# Patient Record
Sex: Male | Born: 1945 | Race: Black or African American | Hispanic: No | Smoking: Current every day smoker
Health system: Southern US, Community
[De-identification: ages and names within clinical notes are randomized; demographics above are authoritative.]

## PROBLEM LIST (undated history)

## (undated) DIAGNOSIS — M5136 Other intervertebral disc degeneration, lumbar region: Secondary | ICD-10-CM

## (undated) DIAGNOSIS — N400 Enlarged prostate without lower urinary tract symptoms: Secondary | ICD-10-CM

## (undated) DIAGNOSIS — K529 Noninfective gastroenteritis and colitis, unspecified: Secondary | ICD-10-CM

## (undated) DIAGNOSIS — M51369 Other intervertebral disc degeneration, lumbar region without mention of lumbar back pain or lower extremity pain: Secondary | ICD-10-CM

## (undated) DIAGNOSIS — R4182 Altered mental status, unspecified: Secondary | ICD-10-CM

## (undated) DIAGNOSIS — I10 Essential (primary) hypertension: Secondary | ICD-10-CM

## (undated) DIAGNOSIS — I639 Cerebral infarction, unspecified: Secondary | ICD-10-CM

## (undated) DIAGNOSIS — A419 Sepsis, unspecified organism: Secondary | ICD-10-CM

## (undated) DIAGNOSIS — I998 Other disorder of circulatory system: Secondary | ICD-10-CM

## (undated) DIAGNOSIS — N289 Disorder of kidney and ureter, unspecified: Secondary | ICD-10-CM

## (undated) DIAGNOSIS — Z7409 Other reduced mobility: Secondary | ICD-10-CM

## (undated) DIAGNOSIS — I82409 Acute embolism and thrombosis of unspecified deep veins of unspecified lower extremity: Secondary | ICD-10-CM

## (undated) DIAGNOSIS — G8929 Other chronic pain: Secondary | ICD-10-CM

## (undated) DIAGNOSIS — M21372 Foot drop, left foot: Secondary | ICD-10-CM

## (undated) DIAGNOSIS — K567 Ileus, unspecified: Secondary | ICD-10-CM

## (undated) DIAGNOSIS — R29898 Other symptoms and signs involving the musculoskeletal system: Secondary | ICD-10-CM

## (undated) HISTORY — PX: BACK SURGERY: SHX140

## (undated) HISTORY — PX: ELBOW SURGERY: SHX618

---

## 2001-04-09 ENCOUNTER — Emergency Department (HOSPITAL_COMMUNITY): Admission: EM | Admit: 2001-04-09 | Discharge: 2001-04-09 | Payer: Self-pay | Admitting: *Deleted

## 2004-06-01 ENCOUNTER — Ambulatory Visit (HOSPITAL_COMMUNITY): Admission: RE | Admit: 2004-06-01 | Discharge: 2004-06-01 | Payer: Self-pay | Admitting: Chiropractic Medicine

## 2007-03-14 ENCOUNTER — Emergency Department (HOSPITAL_COMMUNITY): Admission: EM | Admit: 2007-03-14 | Discharge: 2007-03-14 | Payer: Self-pay | Admitting: Emergency Medicine

## 2007-06-30 ENCOUNTER — Ambulatory Visit (HOSPITAL_COMMUNITY): Admission: RE | Admit: 2007-06-30 | Discharge: 2007-06-30 | Payer: Self-pay | Admitting: Orthopedic Surgery

## 2007-07-31 ENCOUNTER — Ambulatory Visit (HOSPITAL_COMMUNITY): Admission: RE | Admit: 2007-07-31 | Discharge: 2007-07-31 | Payer: Self-pay | Admitting: Orthopedic Surgery

## 2007-08-21 ENCOUNTER — Inpatient Hospital Stay (HOSPITAL_COMMUNITY): Admission: RE | Admit: 2007-08-21 | Discharge: 2007-08-25 | Payer: Self-pay | Admitting: Orthopedic Surgery

## 2009-02-01 ENCOUNTER — Emergency Department (HOSPITAL_COMMUNITY): Admission: EM | Admit: 2009-02-01 | Discharge: 2009-02-01 | Payer: Self-pay | Admitting: Emergency Medicine

## 2009-04-30 ENCOUNTER — Emergency Department (HOSPITAL_COMMUNITY): Admission: EM | Admit: 2009-04-30 | Discharge: 2009-04-30 | Payer: Self-pay | Admitting: Emergency Medicine

## 2009-07-22 ENCOUNTER — Emergency Department (HOSPITAL_COMMUNITY): Admission: EM | Admit: 2009-07-22 | Discharge: 2009-07-22 | Payer: Self-pay | Admitting: Emergency Medicine

## 2009-08-31 ENCOUNTER — Emergency Department (HOSPITAL_COMMUNITY): Admission: EM | Admit: 2009-08-31 | Discharge: 2009-08-31 | Payer: Self-pay | Admitting: Emergency Medicine

## 2010-05-10 LAB — DIFFERENTIAL
Basophils Absolute: 0 10*3/uL (ref 0.0–0.1)
Basophils Relative: 1 % (ref 0–1)
Lymphs Abs: 1.9 10*3/uL (ref 0.7–4.0)
Monocytes Relative: 10 % (ref 3–12)
Neutro Abs: 3.1 10*3/uL (ref 1.7–7.7)

## 2010-05-10 LAB — CBC
HCT: 36.8 % — ABNORMAL LOW (ref 39.0–52.0)
MCV: 91 fL (ref 78.0–100.0)
Platelets: 175 10*3/uL (ref 150–400)
RBC: 4.04 MIL/uL — ABNORMAL LOW (ref 4.22–5.81)
RDW: 13.9 % (ref 11.5–15.5)
WBC: 5.8 10*3/uL (ref 4.0–10.5)

## 2010-05-10 LAB — BASIC METABOLIC PANEL
BUN: 8 mg/dL (ref 6–23)
Chloride: 106 mEq/L (ref 96–112)
Creatinine, Ser: 0.88 mg/dL (ref 0.4–1.5)
GFR calc Af Amer: >60 mL/min (ref >60)

## 2010-05-17 LAB — URINALYSIS, ROUTINE W REFLEX MICROSCOPIC
Glucose, UA: NEGATIVE mg/dL
Hgb urine dipstick: NEGATIVE
Protein, ur: NEGATIVE mg/dL
pH: 7 (ref 5.0–8.0)

## 2010-07-06 NOTE — Op Note (Signed)
NAME:  Brian [name], Brian [name] NO.:  [id number]   MEDICAL RECORD NO.:  [id number]          PATIENT TYPE:  INP   LOCATION:  5031                         FACILITY:  [facility]   PHYSICIAN:  Nelda Severe, MD      DATE OF BIRTH:  10/18/1945   DATE OF PROCEDURE:  08/21/2007  DATE OF DISCHARGE:                               OPERATIVE REPORT   SURGEON:  Nelda Severe, MD   ASSISTANT:  Lianne Cure, PA-C   PREOPERATIVE DIAGNOSES:  Lumbar spinal stenosis status post previous  laminectomy; grade 1 degenerative spondylolisthesis L4-L5.   POSTOPERATIVE DIAGNOSES:  Lumbar spinal stenosis status post previous  laminectomy; grade 1 degenerative spondylolisthesis L4-L5.   OPERATIVE PROCEDURES:  1. Bilateral laminectomies.  2. Foraminotomies.  3. Lateral recess decompression at L3-L4, L4-L5, L5-S1.  4. Posterolateral fusion L4-L5 with locally harvested autograft.   OPERATIVE NOTE:  The patient was placed under general endotracheal  anesthesia.  He had received 1 g of vancomycin intravenously as  prophylaxis against infection.  A Foley catheter was placed in the  bladder.  Sequential compression devices were placed on both lower  extremities.  He was positioned prone on a Jackson frame.  Care was  taken to position the upper extremities so as to avoid hyperflexion and  abduction of the shoulders and hyperflexion of the elbows.  The hips and  knees were gently flexed and supported on pillows.   The previous midline incision was marked with a skin marker.  The skin  was prepped with DuraPrep and the lumbar area draped in rectangular  fashion.  The drapes were secured with Ioban.  The subcutaneous tissue  was injected with a mixture of 0.25% plain Marcaine and 1% lidocaine  with epinephrine.  An incision was made in elliptical fashion around the  previously placed midline incision so as to excise the scar.  Dissection  was carried down through a very thin subcutaneous layer to the  thoracolumbar fascia using cutting current.  The midline was identified.  Cutting current was used to cut down on the spinous process of L3.  We  then incised the midline down to the lamina from L3 through S1.  Paraspinal muscles were mobilized bilaterally and on the left side I  exposed the transverse processes of L4 and L5.  Position was checked  with a cross-table lateral radiograph placing a Penfield #4 dissector  between the lamina of L3 and L4 and this proved to be the correct  location.  There was also a Kocher on the spinous process of S1.   Having denuded the lamina from L3-L4 through L5-S1 of soft tissue, I  began the process of harvesting local bone graft and thinning out the  lamina using an acetabular reamer and in fact we harvested a moderately  large amount of morcellized graft.  We then began the actual laminectomy  at the L3-L4 level.  Ultimately laminectomy was performed from the  distal portion of the L3 lamina through the L5 lamina and performing  partial facetectomies at L3-L4, L4-L5, and L5-S1 bilaterally.  This was  done using high-speed  bur, various sizes of Kerrison rongeurs, and  curettes.  At all times the dura was protected by interposing cottonoid  patties in the plane between the dura and overlying scar and the bone,  which overlay that.  This is a case in which there had been massive bone  overgrowth over a large number of years since last laminectomy.  Eventually the lateral recess at L4, L5, and S1 was decompressed and  foraminotomies performed at L3-L4, L4-L5, and L5-S1.  There were no  complications during the laminectomies.  Facet joints were all  maintained.   I then exposed the right-sided L4-L5 transverse processes, decorticated  them with a high-speed bur, and packed half the harvested autograft in  the interval.  The transverse process on the left side were also  decorticated with a high-speed bur and the other half of the bone graft  packed  posterolaterally on the right side.   We did attempt to inject 0.75 mg of intrathecal, preservative-free  morphine, but I got a bloody tap, so no further injection was carried  out.   A 15-gauge Blake drain was placed in the subfascial layer and brought  out through the skin to the right side.  It was secured using a figure-  of-eight 2-0 Vicryl suture in basket-weave fashion.  The thoracolumbar  fascia was then closed using continuous interrupted #1 Vicryl suture.  An one-eighth-inch Hemovac drain was placed in the subcutaneous layer  and also secured with 2-0 nylon.  Subcutaneous layer was closed using  interrupted 2-0 undyed Vicryl in inverted fashion.  The skin was closed  using a subcuticular running 3-0 undyed Vicryl stitch.  The skin edges  were reinforced with Steri-Strips.  Nonadherent antibiotic ointment  dressing was applied and secured with OpSite.   ESTIMATED BLOOD LOSS:  250 mL.   At the time of dictation, I did not know if the patient has gotten any  cell saver blood back were not.  The cell saver was used throughout the  procedure.  There were no intraoperative complications.  The sponge and  needle counts were correct.      Nelda Severe, MD  Electronically Signed     MT/MEDQ  D:  08/21/2007  T:  08/22/2007  Job:  [id number]

## 2010-07-09 NOTE — Discharge Summary (Signed)
NAME:  Brian [name], Brian [name] NO.:  [id number]   MEDICAL RECORD NO.:  [id number]          PATIENT TYPE:  INP   LOCATION:  5031                         FACILITY:  [facility]   PHYSICIAN:  Nelda Severe, MD      DATE OF BIRTH:  [birth year]   DATE OF ADMISSION:  08/21/2007  DATE OF DISCHARGE:  08/25/2007                               DISCHARGE SUMMARY   ADMITTING DIAGNOSIS:  Lumbar stenosis.   BRIEF HISTORY OF STAY:  He was admitted to [facility] on August 21, 2007 for surgical fusion of L3-S1 without instrumentation.  Blood  loss was estimated at 500 mL.  Postoperatively, the patient was stable  and admitted to 5000 at [facility].  Hemoglobin was stable at 9.9.  He  was asymptomatic, no dizziness, no short of breath, no chest pain.  Dressing was clean, dry, and intact.  Drains were in place, 370 mL total  output of drains.  Postop day #2, the patient states his legs felt  significantly better postoperatively than prior to surgery.  We  discontinued the drains.  Clean dry dressing was applied.  Discontinued  the PCA and hep-locked the IVs.  Physical Therapy was consulted for  ambulation and mobility.  Case manager was consulted for home health  needs.  On postop day #3, the patient was stable, afebrile, and vital  signs were stable.  We discontinued the dressing.  There was no drainage  whatsoever.  No openings in the skin.  No erythema, no edema.  He will  be discharged home.   DIAGNOSIS:  Lumbar stenosis L3-S1 status post laminectomy fusion without  instrumentation.   DISPOSITION:  Stable.   DIET:  Regular.   PLAN:  He is going to walk for exercise.  He may shower.  He is going to  follow up with Dr. Nelda Severe in 4 weeks.  We will send him home with  Norco 10/325 and Robaxin 500 mg take as directed.      Lianne Cure, P.A.       Nelda Severe, MD  Electronically Signed    MC/MEDQ  D:  09/18/2007  T:  09/19/2007  Job:  [id number]

## 2010-11-18 LAB — DIFFERENTIAL
Eosinophils Absolute: 0.2
Eosinophils Relative: 3
Lymphs Abs: 2.7
Monocytes Absolute: 0.8
Monocytes Relative: 12
Neutro Abs: 3.1
Neutrophils Relative %: 45

## 2010-11-18 LAB — COMPREHENSIVE METABOLIC PANEL
ALT: 14
Albumin: 3.5
CO2: 26
Calcium: 9.2
Creatinine, Ser: 0.95
Glucose, Bld: 95
Potassium: 4.3
Total Protein: 7.2

## 2010-11-18 LAB — CBC
HCT: 37.9 — ABNORMAL LOW
HCT: 28.8 — ABNORMAL LOW
Hemoglobin: 12.9 — ABNORMAL LOW
MCHC: 34.1
MCHC: 33.3
MCHC: 34.5
MCV: 89.9
MCV: 91.3
Platelets: 116 — ABNORMAL LOW
Platelets: 134 — ABNORMAL LOW
RBC: 3.05 — ABNORMAL LOW
RDW: 13.3
WBC: 6.9
WBC: 8.1
WBC: 9.7

## 2010-11-18 LAB — BASIC METABOLIC PANEL
BUN: 4 — ABNORMAL LOW
BUN: 6
CO2: 26
CO2: 27
Calcium: 8.2 — ABNORMAL LOW
Chloride: 104
Chloride: 104
Creatinine, Ser: 0.83
Creatinine, Ser: 0.92
GFR calc Af Amer: >60
Potassium: 3.8

## 2010-11-18 LAB — VITAMIN D 25 HYDROXY (VIT D DEFICIENCY, FRACTURES): Vit D, 25-Hydroxy: 31 (ref 30–89)

## 2010-11-18 LAB — URINE CULTURE: Colony Count: 3000

## 2010-11-18 LAB — URINALYSIS, ROUTINE W REFLEX MICROSCOPIC
Bilirubin Urine: NEGATIVE
Glucose, UA: NEGATIVE
Ketones, ur: 15 — AB
Protein, ur: NEGATIVE
Specific Gravity, Urine: 1.036 — ABNORMAL HIGH
pH: 6.5

## 2010-11-18 LAB — TYPE AND SCREEN: ABO/RH(D): O POS

## 2010-11-18 LAB — ABO/RH: ABO/RH(D): O POS

## 2010-12-13 ENCOUNTER — Inpatient Hospital Stay (HOSPITAL_COMMUNITY)
Admission: EM | Admit: 2010-12-13 | Discharge: 2010-12-24 | DRG: 329 | Disposition: A | Discharge status: Other Acute Inpatient Hospital | Payer: Medicare Other | Attending: Internal Medicine | Admitting: Internal Medicine

## 2010-12-13 ENCOUNTER — Emergency Department (HOSPITAL_COMMUNITY): Payer: Medicare Other

## 2010-12-13 ENCOUNTER — Encounter: Payer: Self-pay | Admitting: *Deleted

## 2010-12-13 DIAGNOSIS — D72829 Elevated white blood cell count, unspecified: Secondary | ICD-10-CM | POA: Diagnosis present

## 2010-12-13 DIAGNOSIS — K55059 Acute (reversible) ischemia of intestine, part and extent unspecified: Principal | ICD-10-CM | POA: Diagnosis present

## 2010-12-13 DIAGNOSIS — J189 Pneumonia, unspecified organism: Secondary | ICD-10-CM | POA: Diagnosis not present

## 2010-12-13 DIAGNOSIS — R112 Nausea with vomiting, unspecified: Secondary | ICD-10-CM | POA: Diagnosis present

## 2010-12-13 DIAGNOSIS — I10 Essential (primary) hypertension: Secondary | ICD-10-CM | POA: Diagnosis present

## 2010-12-13 DIAGNOSIS — K529 Noninfective gastroenteritis and colitis, unspecified: Secondary | ICD-10-CM

## 2010-12-13 DIAGNOSIS — D649 Anemia, unspecified: Secondary | ICD-10-CM | POA: Diagnosis not present

## 2010-12-13 DIAGNOSIS — I776 Arteritis, unspecified: Secondary | ICD-10-CM | POA: Diagnosis present

## 2010-12-13 DIAGNOSIS — K5289 Other specified noninfective gastroenteritis and colitis: Secondary | ICD-10-CM | POA: Diagnosis present

## 2010-12-13 DIAGNOSIS — N179 Acute kidney failure, unspecified: Secondary | ICD-10-CM | POA: Diagnosis not present

## 2010-12-13 DIAGNOSIS — K219 Gastro-esophageal reflux disease without esophagitis: Secondary | ICD-10-CM | POA: Diagnosis present

## 2010-12-13 DIAGNOSIS — K560 Paralytic ileus: Secondary | ICD-10-CM | POA: Diagnosis not present

## 2010-12-13 DIAGNOSIS — R1013 Epigastric pain: Secondary | ICD-10-CM | POA: Diagnosis present

## 2010-12-13 HISTORY — DX: Noninfective gastroenteritis and colitis, unspecified: K52.9

## 2010-12-13 HISTORY — DX: Cerebral infarction, unspecified: I63.9

## 2010-12-13 HISTORY — DX: Essential (primary) hypertension: I10

## 2010-12-13 LAB — URINALYSIS, ROUTINE W REFLEX MICROSCOPIC
Glucose, UA: NEGATIVE mg/dL
Leukocytes, UA: NEGATIVE
pH: 6 (ref 5.0–8.0)

## 2010-12-13 LAB — DIFFERENTIAL
Basophils Absolute: 0 10*3/uL (ref 0.0–0.1)
Basophils Relative: 0 % (ref 0–1)
Lymphocytes Relative: 18 % (ref 12–46)
Neutro Abs: 8.7 10*3/uL — ABNORMAL HIGH (ref 1.7–7.7)
Neutrophils Relative %: 71 % (ref 43–77)

## 2010-12-13 LAB — COMPREHENSIVE METABOLIC PANEL
ALT: 6 U/L (ref 0–53)
AST: 12 U/L (ref 0–37)
Albumin: 3.1 g/dL — ABNORMAL LOW (ref 3.5–5.2)
Alkaline Phosphatase: 78 U/L (ref 39–117)
CO2: 27 mEq/L (ref 19–32)
Chloride: 99 mEq/L (ref 96–112)
Creatinine, Ser: 0.9 mg/dL (ref 0.50–1.35)
Potassium: 4 mEq/L (ref 3.5–5.1)
Sodium: 134 mEq/L — ABNORMAL LOW (ref 135–145)
Total Bilirubin: 0.7 mg/dL (ref 0.3–1.2)

## 2010-12-13 LAB — CBC
MCHC: 32.9 g/dL (ref 30.0–36.0)
RDW: 13.2 % (ref 11.5–15.5)
WBC: 12.3 10*3/uL — ABNORMAL HIGH (ref 4.0–10.5)

## 2010-12-13 LAB — URINE MICROSCOPIC-ADD ON

## 2010-12-13 MED ORDER — ALUM & MAG HYDROXIDE-SIMETH 200-200-20 MG/5ML PO SUSP: 30.0000 mL | Freq: Four times a day (QID) | ORAL | Status: DC | PRN

## 2010-12-13 MED ORDER — HYDROMORPHONE HCL 1 MG/ML IJ SOLN
1.0000 mg | INTRAMUSCULAR | Status: DC | PRN
Start: 2010-12-13 — End: 2010-12-19
  Administered 2010-12-13 – 2010-12-19 (×25): 1 mg via INTRAVENOUS
  Filled 2010-12-13 (×24): qty 1

## 2010-12-13 MED ORDER — OXYCODONE HCL 5 MG PO TABS
5.0000 mg | ORAL_TABLET | ORAL | Status: DC | PRN
  Administered 2010-12-14 – 2010-12-18 (×2): 5 mg via ORAL
  Filled 2010-12-13 (×2): qty 1

## 2010-12-13 MED ORDER — SODIUM CHLORIDE 0.9 % IV SOLN
INTRAVENOUS | Status: DC
  Administered 2010-12-13 – 2010-12-15 (×4): via INTRAVENOUS
  Administered 2010-12-16: 100 mL via INTRAVENOUS
  Administered 2010-12-16: 1000 mL via INTRAVENOUS

## 2010-12-13 MED ORDER — ACETAMINOPHEN 325 MG PO TABS
650.0000 mg | ORAL_TABLET | Freq: Four times a day (QID) | ORAL | Status: DC | PRN
  Administered 2010-12-15 – 2010-12-16 (×2): 650 mg via ORAL
  Filled 2010-12-13 (×2): qty 2

## 2010-12-13 MED ORDER — ONDANSETRON HCL 4 MG/2ML IJ SOLN: 4.0000 mg | Freq: Three times a day (TID) | INTRAMUSCULAR | Status: DC | PRN

## 2010-12-13 MED ORDER — SODIUM CHLORIDE 0.9 % IV SOLN
999.0000 mL | INTRAVENOUS | Status: DC
  Administered 2010-12-13: 1000 mL via INTRAVENOUS

## 2010-12-13 MED ORDER — HYDROMORPHONE HCL 1 MG/ML IJ SOLN
1.0000 mg | Freq: Once | INTRAMUSCULAR | Status: AC
  Administered 2010-12-13: 1 mg via INTRAVENOUS

## 2010-12-13 MED ORDER — IOHEXOL 300 MG/ML  SOLN
100.0000 mL | Freq: Once | INTRAMUSCULAR | Status: AC | PRN
  Administered 2010-12-13: 100 mL via INTRAVENOUS

## 2010-12-13 MED ORDER — ONDANSETRON HCL 4 MG/2ML IJ SOLN: 4.0000 mg | Freq: Four times a day (QID) | INTRAMUSCULAR | Status: DC | PRN

## 2010-12-13 MED ORDER — ONDANSETRON HCL 4 MG PO TABS: 4.0000 mg | ORAL_TABLET | Freq: Four times a day (QID) | ORAL | Status: DC | PRN

## 2010-12-13 MED ORDER — ENOXAPARIN SODIUM 40 MG/0.4ML ~~LOC~~ SOLN
40.0000 mg | SUBCUTANEOUS | Status: DC
  Administered 2010-12-13 – 2010-12-16 (×4): 40 mg via SUBCUTANEOUS
  Filled 2010-12-13 (×4): qty 0.4

## 2010-12-13 MED ORDER — MORPHINE SULFATE 4 MG/ML IJ SOLN
4.0000 mg | Freq: Once | INTRAMUSCULAR | Status: AC
  Administered 2010-12-13: 4 mg via INTRAVENOUS
  Filled 2010-12-13: qty 1

## 2010-12-13 MED ORDER — ACETAMINOPHEN 650 MG RE SUPP: 650.0000 mg | Freq: Four times a day (QID) | RECTAL | Status: DC | PRN

## 2010-12-13 MED ORDER — HYDROMORPHONE HCL 1 MG/ML IJ SOLN
0.5000 mg | INTRAMUSCULAR | Status: DC | PRN
  Filled 2010-12-13: qty 1

## 2010-12-13 MED ORDER — HYDROMORPHONE HCL 1 MG/ML IJ SOLN
INTRAMUSCULAR | Status: AC
  Filled 2010-12-13: qty 1

## 2010-12-13 MED ORDER — ONDANSETRON HCL 4 MG/2ML IJ SOLN
4.0000 mg | Freq: Once | INTRAMUSCULAR | Status: AC
  Administered 2010-12-13: 4 mg via INTRAVENOUS
  Filled 2010-12-13: qty 2

## 2010-12-13 NOTE — ED Notes (Signed)
Pt with mid abdomen pain since last night after eating a sirloin, denies N/V/D

## 2010-12-13 NOTE — ED Notes (Signed)
Pt states  Upper abdominal pain after eating last night "feels like food did not go down" Pt states he ate tenderloin.  Denies N/V/D/ Pt also states lower back pain.

## 2010-12-13 NOTE — ED Provider Notes (Signed)
Medical screening examination/treatment/procedure(s) were conducted as a shared visit with non-physician practitioner(s) and myself.  I personally evaluated the patient during the encounter Pt with abd pain.  Tender llq  Benny Lennert, MD 12/13/10 [phone]

## 2010-12-13 NOTE — ED Notes (Signed)
MD at [employer], PA in room

## 2010-12-13 NOTE — H&P (Signed)
PCP:   Josue Hector, MD   Chief Complaint:  Abdominal pain with nausea vomiting  HPI: Patient is a 65 year old African American male with no past medical history who has had approximately one to 2 days of nausea vomiting and mid epigastric to right upper quadrant abdominal pain. It is nonradiating. He has had no fevers. He has had no constipation or diarrhea. He usually not sick and so decided to come to the emergency room for further evaluation.  The emergency room he was noted to have white cell count of 12.  A CT scan done noted mesenteric edema, mild ascites and small bowel wall thickening. This is most likely felt to be in enteritis although technically a microperforation cannot be ruled out.  Review of Systems:  The patient denies anorexia, fever, weight loss,, vision loss, decreased hearing, hoarseness, chest pain, syncope, dyspnea on exertion, peripheral edema, balance deficits, hemoptysis,melena, hematochezia, severe indigestion/heartburn, hematuria, incontinence, genital sores, muscle weakness, suspicious skin lesions, transient blindness, difficulty walking, depression, unusual weight change, abnormal bleeding, enlarged lymph nodes, angioedema, and breast masses.  Past Medical History: History reviewed. No pertinent past medical history. Past Surgical History  Procedure Date  . Back surgery     Medications: Prior to Admission medications   Medication Sig Start Date End Date Taking? Authorizing Provider  aspirin EC 81 MG tablet Take 81 mg by mouth daily.     Yes Historical Provider, MD    Allergies:  No Known Allergies  Social History:  reports that he has been smoking.  He does not have any smokeless tobacco history on file. He reports that he drinks alcohol. He reports that he does not use illicit drugs.  Family History: Says no medical problems run in the family  Physical Exam: Filed Vitals:   12/13/10 1024 12/13/10 1428 12/13/10 1900  BP: 139/94 146/104  121/85  Pulse: 118 92 104  Temp: 97.8 F (36.6 C) 99.2 F (37.3 C)   TempSrc: Oral Oral   Resp: 16 16 20   Height: 5' 7.5" (1.715 m)    Weight: 69.4 kg (153 lb)    SpO2: 100% 95% 96%   General appearance: alert, cooperative and mild distress Head: Normocephalic, without obvious abnormality, atraumatic Resp: clear to auscultation bilaterally Cardio: regular rate and rhythm, S1, S2 normal, no murmur, click, rub or gallop GI: Soft, some mild tenderness in the midepigastric and right upper quadrant areas, nondistended, normoactive bowel sounds. Extremities: extremities normal, atraumatic, no cyanosis or edema   Labs on Admission:   [facility] 12/13/10 1226  NA 134*  K 4.0  CL 99  CO2 27  GLUCOSE 112*  BUN 15  CREATININE 0.90  CALCIUM 8.8  MG --  PHOS --    Basename 12/13/10 1226  AST 12  ALT 6  ALKPHOS 78  BILITOT 0.7  PROT 7.9  ALBUMIN 3.1*    Basename 12/13/10 1226  LIPASE 18  AMYLASE --    Basename 12/13/10 1226  WBC 12.3*  NEUTROABS 8.7*  HGB 14.5  HCT 44.1  MCV 90.9  PLT 232    Radiological Exams on Admission: Ct Abdomen Pelvis W Contrast  12/13/2010 CT ABDOMEN AND PELVIS WITH CONTRAST   IMPRESSION:  1.  Mid small bowel wall thickening in the periumbilical region, mesenteric edema and ascites of undetermined etiology.  The findings are suspicious for focal enteritis.  Microperforation cannot be excluded, although there is no extraluminal air. 2.  No evidence of large vessel occlusion or focal extraluminal fluid collection. 3.  Mild periportal edema within the liver.  No other significant solid parenchymal organ findings.  These results were called by telephone on 12/13/2010  at  1640 hours to  Dr. Adriana Simas, who verbally acknowledged these results.  Original Report Authenticated By: Gerrianne Scale, M.D.    Assessment/Plan Present on Admission:  .Enteritis: Likely mild and viral. No evidence of any early sepsis or bacterial infection. Plan treat  symptomatically with pain and nausea control and IV fluids. Recheck white blood cell count in the morning. Start on clear liquid diet and advance slowly as tolerated. Likely could go home tomorrow if feeling better.  .Nausea and vomiting: See above .Abdominal pain, acute, epigastric: See above  .Leukocytosis: Likely some stressed margination  Cala Kruckenberg K 12/13/2010, 7:02 PM

## 2010-12-13 NOTE — ED Provider Notes (Signed)
History     CSN: [id number] Arrival date & time: 12/13/2010 10:27 AM   First MD Initiated Contact with Patient 12/13/10 1201      Chief Complaint  Patient presents with  . Abdominal Pain    (Consider location/radiation/quality/duration/timing/severity/associated sxs/prior treatment) Patient is a 65 y.o. male presenting with abdominal pain. The history is provided by the patient.  Abdominal Pain The primary symptoms of the illness include abdominal pain. The primary symptoms of the illness do not include fever, shortness of breath, nausea, vomiting or diarrhea. The current episode started yesterday. The onset of the illness was gradual. The problem has been gradually worsening.  The abdominal pain is located in the periumbilical region. The abdominal pain radiates to the LLQ and LUQ. The severity of the abdominal pain is 10/10. The abdominal pain is relieved by nothing. The abdominal pain is exacerbated by movement.  Associated with: He denies nausea and vomiting,  no fevers. The patient has not had a change in bowel habit. Additional symptoms associated with the illness include back pain. Symptoms associated with the illness do not include diaphoresis, constipation, urgency or frequency. Associated symptoms comments: He has chronic back pain which is not worsened since his abdominal pain started.. Significant associated medical issues do not include inflammatory bowel disease, diabetes, gallstones, liver disease or substance abuse.    History reviewed. No pertinent past medical history.  Past Surgical History  Procedure Date  . Back surgery     No family history on file.  History  Substance Use Topics  . Smoking status: Current Some Day Smoker  . Smokeless tobacco: Not on file  . Alcohol Use: Yes     Occ      Review of Systems  Constitutional: Negative for fever and diaphoresis.  HENT: Negative for congestion, sore throat and neck pain.   Eyes: Negative.   Respiratory:  Negative for chest tightness and shortness of breath.   Cardiovascular: Negative for chest pain.  Gastrointestinal: Positive for abdominal pain. Negative for nausea, vomiting, diarrhea, constipation and abdominal distention.  Genitourinary: Negative.  Negative for urgency and frequency.  Musculoskeletal: Positive for back pain. Negative for joint swelling and arthralgias.  Skin: Negative.  Negative for rash and wound.  Neurological: Negative for dizziness, weakness, light-headedness, numbness and headaches.  Hematological: Negative.   Psychiatric/Behavioral: Negative.     Allergies  Review of patient's allergies indicates no known allergies.  Home Medications   Current Outpatient Rx  Name Route Sig Dispense Refill  . ASPIRIN EC 81 MG PO TBEC Oral Take 81 mg by mouth daily.        BP 146/104  Pulse 92  Temp(Src) 99.2 F (37.3 C) (Oral)  Resp 16  Ht 5' 7.5" (1.715 m)  Wt 153 lb (69.4 kg)  BMI 23.61 kg/m2  SpO2 95%  Physical Exam  Nursing note and vitals reviewed. Constitutional: He is oriented to person, place, and time. He appears well-developed and well-nourished. He appears distressed.  HENT:  Head: Normocephalic and atraumatic.  Neck: Normal range of motion.  Cardiovascular: Normal rate, regular rhythm, normal heart sounds and intact distal pulses.   Pulmonary/Chest: Effort normal and breath sounds normal. He has no wheezes.  Abdominal: Bowel sounds are normal. He exhibits no mass. There is no hepatosplenomegaly. There is tenderness in the periumbilical area, left upper quadrant and left lower quadrant. There is no rebound, no CVA tenderness, no tenderness at McBurney's point and negative Murphy's sign.  Musculoskeletal: Normal range of motion.  Neurological:  He is alert and oriented to person, place, and time.  Skin: Skin is warm and dry.  Psychiatric: He has a normal mood and affect.    ED Course  Procedures (including critical care time)  Labs Reviewed  CBC -  Abnormal; Notable for the following:    WBC 12.3 (*)    All other components within normal limits  DIFFERENTIAL - Abnormal; Notable for the following:    Neutro Abs 8.7 (*)    Monocytes Absolute 1.3 (*)    All other components within normal limits  COMPREHENSIVE METABOLIC PANEL - Abnormal; Notable for the following:    Sodium 134 (*)    Glucose, Bld 112 (*)    Albumin 3.1 (*)    GFR calc non Af Amer 89 (*)    All other components within normal limits  URINALYSIS, ROUTINE W REFLEX MICROSCOPIC - Abnormal; Notable for the following:    Specific Gravity, Urine >1.030 (*)    Hgb urine dipstick MODERATE (*)    Ketones, ur TRACE (*)    Protein, ur 30 (*)    All other components within normal limits  URINE MICROSCOPIC-ADD ON - Abnormal; Notable for the following:    Squamous Epithelial / LPF FEW (*)    Bacteria, UA FEW (*)    All other components within normal limits  LIPASE, BLOOD   Ct Abdomen Pelvis W Contrast  12/13/2010  *RADIOLOGY REPORT*  Clinical Data: Upper to mid abdominal pain since yesterday.  CT ABDOMEN AND PELVIS WITH CONTRAST  Technique:  Multidetector CT imaging of the abdomen and pelvis was performed following the standard protocol during bolus administration of intravenous contrast.  Contrast: OMNIPAQUE IOHEXOL 300 MG/ML IV SOLN  Comparison: Lumbar spine CT 07/31/2007.  Findings: There is mild scarring in both lung bases.  No significant pleural effusion is present.  There is a small to moderate amount of ascites throughout the peritoneal cavity.  The enteric contrast is just beginning to fill the proximal jejunum.  There is no extravasated enteric contrast or extraluminal air collection.  However, there are multiple loops of thickened small bowel in the periumbilical region.  There is surrounding mesenteric edema. The appendix appears normal.  The celiac trunk, superior and inferior mesenteric arteries appear widely patent.  There are scattered vascular calcifications.  The  portal, splenic and superior mesenteric veins appear patent.  There is mild periportal edema within the liver.  A small ill- defined low density lesion is noted posteriorly in the right hepatic lobe on image number 11.  The spleen, gallbladder, pancreas, adrenal glands and kidneys appear normal.  The urinary bladder and prostate gland appear normal.  There are diffuse degenerative and postsurgical changes of the lumbar spine status post fusion at L5-S1.  Multilevel endplate degenerative changes are present.  No acute osseous findings are seen.  IMPRESSION:  1.  Mid small bowel wall thickening in the periumbilical region, mesenteric edema and ascites of undetermined etiology.  The findings are suspicious for focal enteritis.  Microperforation cannot be excluded, although there is no extraluminal air. 2.  No evidence of large vessel occlusion or focal extraluminal fluid collection. 3.  Mild periportal edema within the liver.  No other significant solid parenchymal organ findings.  These results were called by telephone on 12/13/2010  at  1640 hours to  Dr. Adriana Simas, who verbally acknowledged these results.  Original Report Authenticated By: Gerrianne Scale, M.D.   Patient given morphine 4 mg IV with no fleeting pain relief.  Dilaudid 1 mg IV given with complete relief of sx.  No diagnosis found.    MDM  Discussed case with Dr. Estell Harpin who also examined pt.  Review of Ct scan and labs.  Patient to be admitted for further eval and pain management.  Call placed to [facility] for admission.        Candis Musa, PA 12/13/10 1756

## 2010-12-14 LAB — CBC
HCT: 36 % — ABNORMAL LOW (ref 39.0–52.0)
MCH: 30 pg (ref 26.0–34.0)
MCV: 90.7 fL (ref 78.0–100.0)
Platelets: 193 10*3/uL (ref 150–400)
RBC: 3.97 MIL/uL — ABNORMAL LOW (ref 4.22–5.81)
WBC: 10.1 10*3/uL (ref 4.0–10.5)

## 2010-12-14 LAB — BASIC METABOLIC PANEL
CO2: 23 mEq/L (ref 19–32)
Calcium: 8.3 mg/dL — ABNORMAL LOW (ref 8.4–10.5)
Chloride: 100 mEq/L (ref 96–112)
Creatinine, Ser: 0.95 mg/dL (ref 0.50–1.35)
Glucose, Bld: 87 mg/dL (ref 70–99)

## 2010-12-14 MED ORDER — SODIUM CHLORIDE 0.9 % IJ SOLN
INTRAMUSCULAR | Status: AC
  Filled 2010-12-14: qty 3

## 2010-12-14 MED ORDER — SODIUM CHLORIDE 0.9 % IJ SOLN
INTRAMUSCULAR | Status: AC
  Filled 2010-12-14: qty 10

## 2010-12-14 NOTE — Progress Notes (Signed)
UR Chart Review Completed  

## 2010-12-15 ENCOUNTER — Inpatient Hospital Stay (HOSPITAL_COMMUNITY): Payer: Medicare Other

## 2010-12-15 LAB — LACTIC ACID, PLASMA: Lactic Acid, Venous: 0.6 mmol/L (ref 0.5–2.2)

## 2010-12-15 LAB — CBC
HCT: 33.4 % — ABNORMAL LOW (ref 39.0–52.0)
Hemoglobin: 11.4 g/dL — ABNORMAL LOW (ref 13.0–17.0)
MCH: 30.2 pg (ref 26.0–34.0)
MCHC: 34.1 g/dL (ref 30.0–36.0)
RDW: 12.8 % (ref 11.5–15.5)

## 2010-12-15 LAB — COMPREHENSIVE METABOLIC PANEL
Albumin: 2.4 g/dL — ABNORMAL LOW (ref 3.5–5.2)
BUN: 9 mg/dL (ref 6–23)
Calcium: 7.8 mg/dL — ABNORMAL LOW (ref 8.4–10.5)
Creatinine, Ser: 0.83 mg/dL (ref 0.50–1.35)
GFR calc Af Amer: >90 mL/min (ref >90)
Glucose, Bld: 79 mg/dL (ref 70–99)
Potassium: 3.7 mEq/L (ref 3.5–5.1)
Total Protein: 6.4 g/dL (ref 6.0–8.3)

## 2010-12-15 LAB — LIPASE, BLOOD: Lipase: 8 U/L — ABNORMAL LOW (ref 11–59)

## 2010-12-15 MED ORDER — SODIUM CHLORIDE 0.9 % IV BOLUS (SEPSIS)
500.0000 mL | Freq: Once | INTRAVENOUS | Status: AC
  Administered 2010-12-15: 500 mL via INTRAVENOUS

## 2010-12-15 MED ORDER — METRONIDAZOLE IN NACL 5-0.79 MG/ML-% IV SOLN
500.0000 mg | Freq: Three times a day (TID) | INTRAVENOUS | Status: DC
  Administered 2010-12-15 – 2010-12-24 (×27): 500 mg via INTRAVENOUS
  Filled 2010-12-15 (×36): qty 100

## 2010-12-15 MED ORDER — CIPROFLOXACIN IN D5W 400 MG/200ML IV SOLN
400.0000 mg | Freq: Two times a day (BID) | INTRAVENOUS | Status: DC
  Administered 2010-12-15 – 2010-12-19 (×9): 400 mg via INTRAVENOUS
  Filled 2010-12-15 (×10): qty 200

## 2010-12-15 MED ORDER — METRONIDAZOLE IN NACL 5-0.79 MG/ML-% IV SOLN
INTRAVENOUS | Status: AC
  Filled 2010-12-15: qty 100

## 2010-12-15 MED ORDER — CIPROFLOXACIN IN D5W 400 MG/200ML IV SOLN
INTRAVENOUS | Status: AC
  Filled 2010-12-15: qty 200

## 2010-12-15 MED ORDER — HYDRALAZINE HCL 20 MG/ML IJ SOLN
10.0000 mg | INTRAMUSCULAR | Status: DC | PRN
  Administered 2010-12-15 – 2010-12-17 (×4): 10 mg via INTRAVENOUS
  Filled 2010-12-15 (×4): qty 1

## 2010-12-15 MED ORDER — NICOTINE 14 MG/24HR TD PT24
14.0000 mg | MEDICATED_PATCH | Freq: Every day | TRANSDERMAL | Status: DC
  Administered 2010-12-15 – 2010-12-24 (×5): 14 mg via TRANSDERMAL
  Filled 2010-12-15 (×13): qty 1

## 2010-12-15 MED ORDER — ALPRAZOLAM 0.25 MG PO TABS
0.2500 mg | ORAL_TABLET | Freq: Three times a day (TID) | ORAL | Status: DC | PRN
  Administered 2010-12-15 – 2010-12-18 (×3): 0.25 mg via ORAL
  Filled 2010-12-15 (×3): qty 1

## 2010-12-15 MED ORDER — ALPRAZOLAM ER 0.5 MG PO TB24: 0.5000 mg | ORAL_TABLET | Freq: Three times a day (TID) | ORAL | Status: DC | PRN

## 2010-12-15 NOTE — Progress Notes (Signed)
Subjective: And is still coming complaining of abdominal pain. Claims pain is much worse at today than it was on admission. Claims his last bowel movement was Saturday. Passed flatus until yesterday.  Objective: Vital signs in last 24 hours: Temp:  [98.4 F (36.9 C)-99.1 F (37.3 C)] 99.1 F (37.3 C) (10/24 0454) Pulse Rate:  [98-118] 118  (10/24 0454) Resp:  [16-20] 20  (10/24 0454) BP: (138-185)/(92-105) 185/105 mmHg (10/24 0454) SpO2:  [93 %-96 %] 95 % (10/24 0454) Weight change:  Body mass index is 23.75 kg/(m^2).  Intake/Output from previous day: 10/23 0701 - 10/24 0700 In: 1480 [P.O.:1080; I.V.:400] Out: -    PHYSICAL EXAM: Gen Exam: Awake and alert with clear speech.   Neck: Supple, No JVD.  Chest: B/L Clear.  CVS: S1 S2 Regular, no murmurs.  Abdomen: soft, BS sluggish. Patient has tenderness in the epigastric and left upper quadrant region. Patient also has significant guarding in that region as well. However diabetic it was mostly soft with rebound and equal at times. Extremities: no edema, warm.   Neurologic: Non Focal.   Skin: No Rash.   Wounds: N/A.    Lab Results:  Basename 12/14/10 0546 12/13/10 1226  WBC 10.1 12.3*  HGB 11.9* 14.5  HCT 36.0* 44.1  PLT 193 232   CMET CMP     Component Value Date/Time   NA 131* 12/14/2010 0546   K 3.8 12/14/2010 0546   CL 100 12/14/2010 0546   CO2 23 12/14/2010 0546   GLUCOSE 87 12/14/2010 0546   BUN 13 12/14/2010 0546   CREATININE 0.95 12/14/2010 0546   CALCIUM 8.3* 12/14/2010 0546   PROT 7.9 12/13/2010 1226   ALBUMIN 3.1* 12/13/2010 1226   AST 12 12/13/2010 1226   ALT 6 12/13/2010 1226   ALKPHOS 78 12/13/2010 1226   BILITOT 0.7 12/13/2010 1226   GFRNONAA 87* 12/14/2010 0546   GFRAA >90 12/14/2010 0546    Studies/Results: Ct Abdomen Pelvis W Contrast  12/13/2010  *RADIOLOGY REPORT*  Clinical Data: Upper to mid abdominal pain since yesterday.  CT ABDOMEN AND PELVIS WITH CONTRAST  Technique:  Multidetector  CT imaging of the abdomen and pelvis was performed following the standard protocol during bolus administration of intravenous contrast.  Contrast: OMNIPAQUE IOHEXOL 300 MG/ML IV SOLN  Comparison: Lumbar spine CT 07/31/2007.  Findings: There is mild scarring in both lung bases.  No significant pleural effusion is present.  There is a small to moderate amount of ascites throughout the peritoneal cavity.  The enteric contrast is just beginning to fill the proximal jejunum.  There is no extravasated enteric contrast or extraluminal air collection.  However, there are multiple loops of thickened small bowel in the periumbilical region.  There is surrounding mesenteric edema. The appendix appears normal.  The celiac trunk, superior and inferior mesenteric arteries appear widely patent.  There are scattered vascular calcifications.  The portal, splenic and superior mesenteric veins appear patent.  There is mild periportal edema within the liver.  A small ill- defined low density lesion is noted posteriorly in the right hepatic lobe on image number 11.  The spleen, gallbladder, pancreas, adrenal glands and kidneys appear normal.  The urinary bladder and prostate gland appear normal.  There are diffuse degenerative and postsurgical changes of the lumbar spine status post fusion at L5-S1.  Multilevel endplate degenerative changes are present.  No acute osseous findings are seen.  IMPRESSION:  1.  Mid small bowel wall thickening in the periumbilical region,  mesenteric edema and ascites of undetermined etiology.  The findings are suspicious for focal enteritis.  Microperforation cannot be excluded, although there is no extraluminal air. 2.  No evidence of large vessel occlusion or focal extraluminal fluid collection. 3.  Mild periportal edema within the liver.  No other significant solid parenchymal organ findings.  These results were called by telephone on 12/13/2010  at  1640 hours to  Dr. Adriana Simas, who verbally  acknowledged these results.  Original Report Authenticated By: Gerrianne Scale, M.D.    Medications: I have reviewed his medications for this inpatient stay  Assessment/Plan: Patient Active Hospital Problem List: Enteritis    Assessment: CT scan of the abdomen done on admission shows mesenteric edema with small bowel thickening. He did have mild leukocytosis on admission. His leukocytosis has resolved but the blood work done yesterday. However he continues to have persistent abdominal pain. He has not been placed on any antibiotics. It is unknown whether the etiology of this enteritis is either viral or bacterial.    Plan: Given his severe unremitting abdominal pain I will get a stat abdominal x-ray, along with a stat CBC CNet and a lactic acid. If his abdominal x-ray does show any evidence of perforation and a stat surgical consultation will be obtained. If he does have evidence of leukocytosis then we will go ahead and place him on empiric antibiotics     Abdominal pain, acute, epigastric   Assessment: This is from the underlying enteritis.    Plan: As above  Hypertension Patient does give a history of hypertension, however he is not on any medications at home. He is on IV fluidshis blood pressures are on the higher side. I will use IV hydralazine as needed basis. Once his intake is resumed and stable GERD and start him on antihypertensive medications.  Disposition Remain an inpatient, stat labs and radiological studies.         LOS: 2 days   Maretta Bees, MD. 12/15/2010, 11:19 AM

## 2010-12-15 NOTE — Progress Notes (Signed)
Called by RN with report of Ct Abdomen and pelvis that was performed earlier today.  Please see report. Concern was about abnormal appendix and colonic wall thickening. Pt admitted with enteritis. Not on antibiotics. Did have elevated WBC at admission. Current WBC is normal but he has a fever of 102 F now.  On examination he has lower abdominal tenderness right greater than left with some guarding but no rebound or rigidity.  Discussed above with Dr. Leticia Penna. Appendix was normal on the initial Ct done on 1022. So this is unlikely to be appendicitis.  But due to onset of fever will proceed with blood cultures and cipro and flagyl for now. Dr. Leticia Penna agrees and will evaluate in AM. No diarrhea reported.  I discussed the above with patient and he agrees with initiation of antibiotics. His main concern is the pain which is mostly in the lower abdomen.  May benefit from GI input as well in AM.

## 2010-12-15 NOTE — Progress Notes (Signed)
Patient c/o abdominal pain worsening, patient verbalizes "I must talk to my doctor." MD notified. Will continue to monitor.

## 2010-12-16 DIAGNOSIS — K219 Gastro-esophageal reflux disease without esophagitis: Secondary | ICD-10-CM | POA: Diagnosis present

## 2010-12-16 DIAGNOSIS — I10 Essential (primary) hypertension: Secondary | ICD-10-CM | POA: Diagnosis present

## 2010-12-16 DIAGNOSIS — K529 Noninfective gastroenteritis and colitis, unspecified: Secondary | ICD-10-CM

## 2010-12-16 HISTORY — DX: Noninfective gastroenteritis and colitis, unspecified: K52.9

## 2010-12-16 LAB — CBC
HCT: 30.4 % — ABNORMAL LOW (ref 39.0–52.0)
Hemoglobin: 10.3 g/dL — ABNORMAL LOW (ref 13.0–17.0)
MCHC: 33.9 g/dL (ref 30.0–36.0)
RBC: 3.45 MIL/uL — ABNORMAL LOW (ref 4.22–5.81)
WBC: 4.9 10*3/uL (ref 4.0–10.5)

## 2010-12-16 LAB — BASIC METABOLIC PANEL
BUN: 10 mg/dL (ref 6–23)
Chloride: 101 mEq/L (ref 96–112)
GFR calc Af Amer: 82 mL/min — ABNORMAL LOW (ref >90)
GFR calc non Af Amer: 70 mL/min — ABNORMAL LOW (ref >90)
Potassium: 3.1 mEq/L — ABNORMAL LOW (ref 3.5–5.1)
Sodium: 131 mEq/L — ABNORMAL LOW (ref 135–145)

## 2010-12-16 MED ORDER — PANTOPRAZOLE SODIUM 40 MG IV SOLR
40.0000 mg | INTRAVENOUS | Status: DC
  Administered 2010-12-16 – 2010-12-17 (×2): 40 mg via INTRAVENOUS
  Filled 2010-12-16 (×2): qty 40

## 2010-12-16 MED ORDER — SODIUM CHLORIDE 0.9 % IJ SOLN
INTRAMUSCULAR | Status: AC
  Administered 2010-12-16: 10 mL
  Filled 2010-12-16: qty 10

## 2010-12-16 NOTE — Progress Notes (Signed)
UR chart review completed.  

## 2010-12-16 NOTE — Progress Notes (Signed)
Subjective: And is still coming complaining of abdominal pain-but slightly better than yesterday. Claims his last bowel movement was today.   Objective: Vital signs in last 24 hours: Temp:  [98.5 F (36.9 C)-102.6 F (39.2 C)] 102.1 F (38.9 C) (10/25 1610) Pulse Rate:  [84-120] 115  (10/25 0632) Resp:  [20-24] 22  (10/25 0632) BP: (117-190)/(67-119) 127/74 mmHg (10/25 0632) SpO2:  [94 %-99 %] 95 % (10/25 9604) Weight change:  Body mass index is 23.75 kg/(m^2).  Intake/Output from previous day: 10/24 0701 - 10/25 0700 In: 120 [P.O.:120] Out: 600 [Urine:600]   PHYSICAL EXAM: Gen Exam: Awake and alert with clear speech.   Neck: Supple, No JVD.  Chest: B/L Clear.  CVS: S1 S2 Regular, no murmurs.  Abdomen: soft, BS sluggish. Patient has tenderness in the epigastric and right mid to lower quadrant region. Patient also has significant guarding in that region as well.  Extremities: no edema, warm.   Neurologic: Non Focal.   Skin: No Rash.   Wounds: N/A.    Lab Results:  Basename 12/16/10 0550 12/15/10 1119  WBC 4.9 6.5  HGB 10.3* 11.4*  HCT 30.4* 33.4*  PLT 173 191   CMET CMP     Component Value Date/Time   NA 131* 12/16/2010 0550   K 3.1* 12/16/2010 0550   CL 101 12/16/2010 0550   CO2 22 12/16/2010 0550   GLUCOSE 98 12/16/2010 0550   BUN 10 12/16/2010 0550   CREATININE 1.09 12/16/2010 0550   CALCIUM 7.6* 12/16/2010 0550   PROT 6.4 12/15/2010 1119   ALBUMIN 2.4* 12/15/2010 1119   AST 12 12/15/2010 1119   ALT 5 12/15/2010 1119   ALKPHOS 61 12/15/2010 1119   BILITOT 0.9 12/15/2010 1119   GFRNONAA 70* 12/16/2010 0550   GFRAA 82* 12/16/2010 0550    Studies/Results: Ct Abdomen Pelvis W Contrast  12/13/2010  *RADIOLOGY REPORT*  Clinical Data: Upper to mid abdominal pain since yesterday.  CT ABDOMEN AND PELVIS WITH CONTRAST  Technique:  Multidetector CT imaging of the abdomen and pelvis was performed following the standard protocol during bolus administration of  intravenous contrast.  Contrast: OMNIPAQUE IOHEXOL 300 MG/ML IV SOLN  Comparison: Lumbar spine CT 07/31/2007.  Findings: There is mild scarring in both lung bases.  No significant pleural effusion is present.  There is a small to moderate amount of ascites throughout the peritoneal cavity.  The enteric contrast is just beginning to fill the proximal jejunum.  There is no extravasated enteric contrast or extraluminal air collection.  However, there are multiple loops of thickened small bowel in the periumbilical region.  There is surrounding mesenteric edema. The appendix appears normal.  The celiac trunk, superior and inferior mesenteric arteries appear widely patent.  There are scattered vascular calcifications.  The portal, splenic and superior mesenteric veins appear patent.  There is mild periportal edema within the liver.  A small ill- defined low density lesion is noted posteriorly in the right hepatic lobe on image number 11.  The spleen, gallbladder, pancreas, adrenal glands and kidneys appear normal.  The urinary bladder and prostate gland appear normal.  There are diffuse degenerative and postsurgical changes of the lumbar spine status post fusion at L5-S1.  Multilevel endplate degenerative changes are present.  No acute osseous findings are seen.  IMPRESSION:  1.  Mid small bowel wall thickening in the periumbilical region, mesenteric edema and ascites of undetermined etiology.  The findings are suspicious for focal enteritis.  Microperforation cannot be excluded, although  there is no extraluminal air. 2.  No evidence of large vessel occlusion or focal extraluminal fluid collection. 3.  Mild periportal edema within the liver.  No other significant solid parenchymal organ findings.  These results were called by telephone on 12/13/2010  at  1640 hours to  Dr. Adriana Simas, who verbally acknowledged these results.  Original Report Authenticated By: Gerrianne Scale, M.D.    Medications: I have reviewed his  medications for this inpatient stay  Assessment/Plan: Patient Active Hospital Problem List:  Right-sided colitis    Assessment: Repeat CT scan of the abdomen done yesterday shows marked ascending colonic wall thickening. It also showed enlargement and edema surrounding the appendix. He did have mild leukocytosis on admission. His leukocytosis has resolved but the blood work done done today. However he continues to have abdominal pain. He also had a fever yesterday evening and as a result has been started on ciprofloxacin and Flagyl. This is likely to be infectious colitis.    Plan: We'll continue to keep him n.p.o. continue his IV fluids. We'll continue his ciprofloxacin and Flagyl. Surgical and GI consultations have been placed and we are currently awaiting this to be done.     Abdominal pain, acute, epigastric   Assessment: This is from his ongoing colitis.   Plan: As above, will continue as needed narcotics.  Hypertension Patient does give a history of hypertension, however he is not on any medications at home. He is on IV fluids,his blood pressures are on the higher side. I will use IV hydralazine as needed basis. Once his intake is resumed and stable GERD and start him on antihypertensive medications.  Disposition Remain an inpatient     LOS: 3 days   Maretta Bees, MD. 12/16/2010, 11:13 AM

## 2010-12-16 NOTE — Consult Note (Signed)
Reason for Consult: Abdominal pain Referring Physician: [facility] hospitalist  Brian [name] is an 65 y.o. male.  HPI: Patient presented to [facility] emergency department with several day history of right lower quadrant abdominal pain. Pain is started relatively insidiously occurred in the bilateral lower quadrants. He is described as sharp somewhat colicky in nature. It is worse with movement and palpation. His appetite has decreased. He has had some associated diarrhea but no blood that he is noted. No melena no hematochezia. No change in urination. He has not had any similar symptomatology in the past. He has had some subjective fevers and chills. Pain has persisted but has remained relatively unchanged since his admission. No significant increase. Pain is durable with administration of minimal amounts of analgesia medications. Pain persists in the right lower quadrant with no significant radiation. No prior history of coronary artery disease/atherosclerotic vascular disease. No family history of atherosclerotic vascular disease.  Past Medical History  Diagnosis Date  . Hypertension     Past Surgical History  Procedure Date  . Back surgery     History reviewed. No pertinent family history.  Social History:  reports that he has been smoking.  He does not have any smokeless tobacco history on file. He reports that he drinks alcohol. He reports that he uses illicit drugs (Marijuana).  Allergies: No Known Allergies  Medications:  Prior to Admission:  Prescriptions prior to admission  Medication Sig Dispense Refill  . aspirin EC 81 MG tablet Take 81 mg by mouth daily.         Scheduled:   . ciprofloxacin  400 mg Intravenous Q12H  . enoxaparin  40 mg Subcutaneous Q24H  . metronidazole  500 mg Intravenous Q8H  . nicotine  14 mg Transdermal Daily  . pantoprazole (PROTONIX) IV  40 mg Intravenous Q24H  . sodium chloride  500 mL Intravenous Once  . sodium chloride       Continuous:   .  sodium chloride 1,000 mL (12/13/10 1236)  . sodium chloride 1,000 mL (12/16/10 0529)   BJY:NWGNFAOZHYQMV, acetaminophen, ALPRAZolam, alum & mag hydroxide-simeth, hydrALAZINE, HYDROmorphone, HYDROmorphone, ondansetron, ondansetron (ZOFRAN) IV, ondansetron, oxyCODONE, DISCONTD: ALPRAZolam  Results for orders placed during the hospital encounter of 12/13/10 (from the past 48 hour(s))  CBC     Status: Abnormal   Collection Time   12/15/10 11:19 AM      Component Value Range Comment   WBC 6.5  4.0 - 10.5 (K/uL)    RBC 3.77 (*) 4.22 - 5.81 (MIL/uL)    Hemoglobin 11.4 (*) 13.0 - 17.0 (g/dL)    HCT 78.4 (*) 69.6 - 52.0 (%)    MCV 88.6  78.0 - 100.0 (fL)    MCH 30.2  26.0 - 34.0 (pg)    MCHC 34.1  30.0 - 36.0 (g/dL)    RDW 29.5  28.4 - 13.2 (%)    Platelets 191  150 - 400 (K/uL)   COMPREHENSIVE METABOLIC PANEL     Status: Abnormal   Collection Time   12/15/10 11:19 AM      Component Value Range Comment   Sodium 131 (*) 135 - 145 (mEq/L)    Potassium 3.7  3.5 - 5.1 (mEq/L)    Chloride 100  96 - 112 (mEq/L)    CO2 22  19 - 32 (mEq/L)    Glucose, Bld 79  70 - 99 (mg/dL)    BUN 9  6 - 23 (mg/dL)    Creatinine, Ser 4.40  0.50 -  1.35 (mg/dL)    Calcium 7.8 (*) 8.4 - 10.5 (mg/dL)    Total Protein 6.4  6.0 - 8.3 (g/dL)    Albumin 2.4 (*) 3.5 - 5.2 (g/dL)    AST 12  0 - 37 (U/L)    ALT 5  0 - 53 (U/L)    Alkaline Phosphatase 61  39 - 117 (U/L)    Total Bilirubin 0.9  0.3 - 1.2 (mg/dL)    GFR calc non Af Amer >90  >90 (mL/min)    GFR calc Af Amer >90  >90 (mL/min)   LACTIC ACID, PLASMA     Status: Normal   Collection Time   12/15/10 11:22 AM      Component Value Range Comment   Lactic Acid, Venous 0.6  0.5 - 2.2 (mmol/L)   LIPASE, BLOOD     Status: Abnormal   Collection Time   12/15/10 11:22 AM      Component Value Range Comment   Lipase 8 (*) 11 - 59 (U/L)   CULTURE, BLOOD (ROUTINE X 2)     Status: Normal (Preliminary result)   Collection Time   12/15/10  9:14 PM      Component  Value Range Comment   Specimen Description BLOOD LEFT HAND      Special Requests BOTTLES DRAWN AEROBIC AND ANAEROBIC 5CC      Culture NO GROWTH 1 DAY      Report Status PENDING     CULTURE, BLOOD (ROUTINE X 2)     Status: Normal (Preliminary result)   Collection Time   12/15/10  9:15 PM      Component Value Range Comment   Specimen Description BLOOD LEFT HAND      Special Requests BOTTLES DRAWN AEROBIC AND ANAEROBIC 5CC      Culture NO GROWTH 1 DAY      Report Status PENDING     BASIC METABOLIC PANEL     Status: Abnormal   Collection Time   12/16/10  5:50 AM      Component Value Range Comment   Sodium 131 (*) 135 - 145 (mEq/L)    Potassium 3.1 (*) 3.5 - 5.1 (mEq/L)    Chloride 101  96 - 112 (mEq/L)    CO2 22  19 - 32 (mEq/L)    Glucose, Bld 98  70 - 99 (mg/dL)    BUN 10  6 - 23 (mg/dL)    Creatinine, Ser 2.95  0.50 - 1.35 (mg/dL)    Calcium 7.6 (*) 8.4 - 10.5 (mg/dL)    GFR calc non Af Amer 70 (*) >90 (mL/min)    GFR calc Af Amer 82 (*) >90 (mL/min)   CBC     Status: Abnormal   Collection Time   12/16/10  5:50 AM      Component Value Range Comment   WBC 4.9  4.0 - 10.5 (K/uL)    RBC 3.45 (*) 4.22 - 5.81 (MIL/uL)    Hemoglobin 10.3 (*) 13.0 - 17.0 (g/dL)    HCT 18.8 (*) 41.6 - 52.0 (%)    MCV 88.1  78.0 - 100.0 (fL)    MCH 29.9  26.0 - 34.0 (pg)    MCHC 33.9  30.0 - 36.0 (g/dL)    RDW 60.6  30.1 - 60.1 (%)    Platelets 173  150 - 400 (K/uL)     Ct Abdomen Pelvis Wo Contrast  12/15/2010  *RADIOLOGY REPORT*  Clinical Data: Abdominal pain.  Hypertension.  CT ABDOMEN AND  PELVIS WITHOUT CONTRAST  Technique:  Multidetector CT imaging of the abdomen and pelvis was performed following the standard protocol without intravenous contrast.  Comparison: Plain films earlier today.  CT of 12/13/2010.  Findings: Minimal bibasilar atelectasis.  Normal heart size.  Trace bilateral pleural effusions are new.  Normal uninfused appearance of the liver, spleen, stomach, pancreas.  Normal  gallbladder, biliary tract, adrenal glands.  No hydronephrosis.  Mild perirenal edema bilaterally.  Aortic atherosclerosis. No retroperitoneal or retrocrural adenopathy.  Scattered descending colonic diverticula.  Marked wall thickening involves the ascending colon and cecum.  Surrounding edema. Example image 49.   Air along the anterior colonic wallis favored to be between thickened haustral folds.  The terminal ileum is normal on image 45.  The appendix is felt to be identified on image 54, measures 1.1 cm.  There is surrounding edema.  Similar small volume abdominal ascites.  The edematous mid abdominal anterior small bowel loop is no longer identified.  No small bowel obstruction.  Diffuse mesenteric edema.  Apparent small bowel wall thickening on image 56, within the left lower quadrant, possibly related to underdistension.  No free intraperitoneal air.  No pelvic adenopathy.    Normal urinary bladder and prostate. Similar small volume cul-de-sac/pelvic fluid.  Severe degenerative disc disease at the lumbosacral junction.  IMPRESSION:  1.  Mildly degraded exam secondary to lack of IV contrast. 2.  Development of marked ascending colonic wall thickening.  Favor infectious colitis.  Apparent areas of gas in the region of the wall are felt to be along the adjacent haustral folds.  No specific evidence of pneumatosis or free extraluminal air. Clinically exclude bowel ischemia 3.  Enlargement and edema surrounding the appendix.  This could be secondary to the colonic process.  Acute appendicitis cannot be excluded.  I personally called and discussed this report with Lerry Paterson, r.n.  at 8:22 p.m. on 12/15/2010. 4.  Similar abdominal pelvic ascites. 5.  Development small bilateral pleural effusions. 6.  Improved appearance of small bowel.  Underdistension versus a residual area of enteritis.  Original Report Authenticated By: Consuello Bossier, M.D.   Dg Abd 2 Views  12/15/2010  *RADIOLOGY REPORT*  Clinical Data:  Progressive abdominal pain.  ABDOMEN - 2 VIEW  Comparison: CT abdomen pelvis 12/13/2010.  Findings: Supine and upright views of the abdomen demonstrate the lung bases are clear.  The contrast is now abdominally within a normal appearing colon.  The bowel gas pattern is otherwise unremarkable.  The axial skeleton is within normal limits.  IMPRESSION:  1.  Oral contrast has progressed to the colon. 2.  Nonspecific bowel gas pattern without evidence for obstruction or free air.  Original Report Authenticated By: Jamesetta Orleans. MATTERN, M.D.    Review of Systems  Constitutional: Positive for fever and chills. Negative for weight loss and malaise/fatigue.  HENT: Negative.   Eyes: Negative.   Respiratory: Negative.   Cardiovascular: Negative.   Gastrointestinal: Positive for heartburn, nausea, vomiting, abdominal pain (bilateral lower quadrants) and diarrhea. Negative for constipation, blood in stool and melena.  Genitourinary: Negative.   Musculoskeletal: Negative.   Skin: Negative.   Neurological: Negative.   Endo/Heme/Allergies: Negative.   Psychiatric/Behavioral: Negative.    Blood pressure 138/85, pulse 97, temperature 99 F (37.2 C), temperature source Oral, resp. rate 20, height 5' 7.5" (1.715 m), weight 69.8 kg (153 lb 14.1 oz), SpO2 94.00%. Physical Exam  Constitutional: He is oriented to person, place, and time. He appears well-developed and well-nourished. He appears  distressed (mild to moderate, appears uncomfortable).       puny   HENT:  Head: Normocephalic and atraumatic.  Eyes: EOM are normal. Pupils are equal, round, and reactive to light. No scleral icterus.  Neck: Normal range of motion. Neck supple. No tracheal deviation present. No thyromegaly present.  Cardiovascular: Regular rhythm.        Tachycardic  Respiratory: Effort normal and breath sounds normal. No respiratory distress. He has no wheezes.  GI: He exhibits no distension and no mass. There is tenderness (right  lower quadrant, moderate to severe. Localized guarding. No diffuse peritoneal signs.). There is guarding (right lower quadrant, voluntary). There is no rebound.  Lymphadenopathy:    He has no cervical adenopathy.  Neurological: He is alert and oriented to person, place, and time.  Skin: Skin is warm and dry.    Assessment/Plan: Abdominal pain right lower quadrant. Highly suspicious for infectious/ischemic colitis. His repeat evaluation and exam demonstrated some improvement. I did discuss with patient's surgical indications and discussed that should his pain progress or continue despite the conservative management surgical exploration may be required. At this point patient clinically does not have any history which would make me suspicious of mesenteric ischemia or other vascular insult. I would continue the patient n.p.o. status, continue bowel rest, continue IV fluid hydration, continue broad-spectrum IV antibiotics. I will follow this patient's progress closely and again discussed with the patient all surgical indications and possible procedures.  Ladene Allocca C 12/16/2010, 4:56 PM

## 2010-12-17 ENCOUNTER — Telehealth: Payer: Self-pay | Admitting: Gastroenterology

## 2010-12-17 ENCOUNTER — Inpatient Hospital Stay (HOSPITAL_COMMUNITY): Payer: Medicare Other

## 2010-12-17 ENCOUNTER — Encounter (HOSPITAL_COMMUNITY): Payer: Self-pay | Admitting: Gastroenterology

## 2010-12-17 DIAGNOSIS — R109 Unspecified abdominal pain: Secondary | ICD-10-CM

## 2010-12-17 DIAGNOSIS — K5289 Other specified noninfective gastroenteritis and colitis: Secondary | ICD-10-CM

## 2010-12-17 LAB — LACTIC ACID, PLASMA: Lactic Acid, Venous: 0.6 mmol/L (ref 0.5–2.2)

## 2010-12-17 LAB — BASIC METABOLIC PANEL
Calcium: 7.7 mg/dL — ABNORMAL LOW (ref 8.4–10.5)
Creatinine, Ser: 1.26 mg/dL (ref 0.50–1.35)
GFR calc Af Amer: 68 mL/min — ABNORMAL LOW (ref >90)

## 2010-12-17 LAB — CBC
MCH: 29.5 pg (ref 26.0–34.0)
MCHC: 33.2 g/dL (ref 30.0–36.0)
MCV: 88.8 fL (ref 78.0–100.0)
Platelets: 167 10*3/uL (ref 150–400)
RDW: 13 % (ref 11.5–15.5)

## 2010-12-17 MED ORDER — SODIUM CHLORIDE 0.9 % IJ SOLN
INTRAMUSCULAR | Status: AC
  Administered 2010-12-17: 13:00:00
  Filled 2010-12-17: qty 10

## 2010-12-17 MED ORDER — HYDROMORPHONE HCL 2 MG PO TABS
1.0000 mg | ORAL_TABLET | Freq: Four times a day (QID) | ORAL | Status: DC
  Administered 2010-12-17: 1 mg via ORAL
  Filled 2010-12-17 (×2): qty 1

## 2010-12-17 MED ORDER — HYDROMORPHONE HCL 1 MG/ML PO LIQD: 1.0000 mg | Freq: Four times a day (QID) | ORAL | Status: DC

## 2010-12-17 MED ORDER — POTASSIUM CHLORIDE IN NACL 20-0.9 MEQ/L-% IV SOLN
INTRAVENOUS | Status: DC
  Administered 2010-12-17: 14:00:00 via INTRAVENOUS
  Administered 2010-12-18: 950 mL via INTRAVENOUS
  Administered 2010-12-18: 1000 mL via INTRAVENOUS
  Administered 2010-12-19: 13:00:00 via INTRAVENOUS
  Administered 2010-12-20: 950 mL via INTRAVENOUS

## 2010-12-17 MED ORDER — SODIUM CHLORIDE 0.9 % IJ SOLN
INTRAMUSCULAR | Status: AC
  Administered 2010-12-17: 10 mL
  Filled 2010-12-17: qty 10

## 2010-12-17 NOTE — Consult Note (Signed)
Referring Provider: [facility]       Primary Care Physician:  Josue Hector, MD Primary Gastroenterologist:  Jonette Eva, MD   Reason for Consultation:  Colitis   HPI: Brian [name] is a 65 y.o. male admitted with acute onset abdominal pain. Seen in ED on 12/13/10. CT with CM showed mid small bowel wall thickening in periumbilical region, mesenteric edema and ascites. Patient has been followed by surgery as well. On 12/15/10 he developed a fever and Cipro/Flagyl were started. CT without IV CM was repeated and showed new marked ascending colonic wall thickening, enlargement and edema surrounding the appendix, improved small bowel appearance.   At baseline patient denies abdominal pain. Has BM every day or every other day depending on how eats. Sunday night started feeling sick. Started with severe abdominal pain around umbilicus and left abdomen. Some nausea but no vomiting. Yesterday was first stool since in hospital. BM X 2 today. No blood. Urine is "dark as chocolate". Doesn't feel like any better at this point. Pain meds helping some for about 2.5 hours. No prior colonoscopy. No heartburn. No dysphagia. No abdominal pain like this ever. No ill contacts. No recent antibiotics. He has not been to the [place].   Temp of 102.49F on 12/16/10 at 0632. Cipro/Flagy started 12/15/10 after first fever.   Prior to Admission medications   Medication Sig Start Date End Date Taking? Authorizing Provider  aspirin EC 81 MG tablet Take 81 mg by mouth daily.     Yes Historical Provider, MD    Current Facility-Administered Medications  Medication Dose Route Frequency Provider Last Rate Last Dose  . 0.9 % NaCl with KCl 20 mEq/ L  infusion   Intravenous Continuous Shanker Ghimire 100 mL/hr at 12/17/10 1348    . acetaminophen (TYLENOL) tablet 650 mg  650 mg Oral Q6H PRN Sendil K Krishnan   650 mg at 12/16/10 0539   Or  . acetaminophen (TYLENOL) suppository 650 mg  650 mg Rectal Q6H PRN Sendil Mordecai Rasmussen      . ALPRAZolam Prudy Feeler) tablet 0.25 mg  0.25 mg Oral TID PRN Shanker Ghimire   0.25 mg at 12/16/10 1946  . alum & mag hydroxide-simeth (MAALOX/MYLANTA) 200-200-20 MG/5ML suspension 30 mL  30 mL Oral Q6H PRN Sendil Mordecai Rasmussen      . ciprofloxacin (CIPRO) IVPB 400 mg  400 mg Intravenous Q12H Gokul Krishnan   400 mg at 12/17/10 0936  . enoxaparin (LOVENOX) injection 40 mg  40 mg Subcutaneous Q24H Sendil K Krishnan   40 mg at 12/16/10 1836  . hydrALAZINE (APRESOLINE) injection 10 mg  10 mg Intravenous Q4H PRN Shanker Ghimire   10 mg at 12/16/10 0539  . HYDROmorphone (DILAUDID) injection 0.5 mg  0.5 mg Intravenous Q4H PRN Candis Musa, PA      . HYDROmorphone (DILAUDID) injection 1 mg  1 mg Intravenous Q4H PRN Sendil K Krishnan   1 mg at 12/17/10 0945  . metroNIDAZOLE (FLAGYL) IVPB 500 mg  500 mg Intravenous Q8H Gokul Krishnan   500 mg at 12/17/10 0509  . nicotine (NICODERM CQ - dosed in mg/24 hours) patch 14 mg  14 mg Transdermal Daily Gokul Krishnan   14 mg at 12/17/10 1000  . ondansetron (ZOFRAN) injection 4 mg  4 mg Intravenous Q8H PRN Candis Musa, PA      . ondansetron (ZOFRAN) tablet 4 mg  4 mg Oral Q6H PRN Sendil K Krishnan       Or  .  ondansetron (ZOFRAN) injection 4 mg  4 mg Intravenous Q6H PRN Sendil Mordecai Rasmussen      . oxyCODONE (Oxy IR/ROXICODONE) immediate release tablet 5 mg  5 mg Oral Q4H PRN Sendil K Krishnan   5 mg at 12/14/10 1947  . pantoprazole (PROTONIX) injection 40 mg  40 mg Intravenous Q24H Shanker Ghimire   40 mg at 12/17/10 1129  . sodium chloride 0.9 % injection        10 mL at 12/16/10 1431  . sodium chloride 0.9 % injection        10 mL at 12/17/10 1129  . sodium chloride 0.9 % injection           . DISCONTD: 0.9 %  sodium chloride infusion  999 mL Intravenous Continuous Candis Musa, PA 100 mL/hr at 12/13/10 1236 1,000 mL at 12/13/10 1236  . DISCONTD: 0.9 %  sodium chloride infusion   Intravenous Continuous Sendil K Krishnan 100 mL/hr at 12/16/10 1946 100 mL at  12/16/10 1946    Allergies as of 12/13/2010  . (No Known Allergies)    Past Medical History  Diagnosis Date  . Hypertension   . CVA (cerebral infarction)     Past Surgical History  Procedure Date  . Back surgery 2002/2009    Family History  Problem Relation Age of Onset  . Colon cancer Neg Hx   . Liver disease Neg Hx     History   Social History  . Marital Status: Widowed    Spouse Name: N/A    Number of Children: 6  . Years of Education: N/A   Occupational History  . retired     Investment banker, operational   Social History Main Topics  . Smoking status: Current Everyday Smoker -- 0.2 packs/day for 40 years  . Smokeless tobacco: Not on file   Comment: 2-3 cigarettes per day  . Alcohol Use: Yes     Occ  . Drug Use: Yes    Special: Marijuana     tried it a couple of months ago per pt.  . Sexually Active: Not on file   Other Topics Concern  . Not on file   Social History Narrative   Has 20 brothers/sisters. Six living children. One deceased child.     ROS:  General: C/O anorexia. No weight loss. See HPI. Eyes: Negative for vision changes.  ENT: Negative for hoarseness, difficulty swallowing , nasal congestion. CV: Negative for chest pain, angina, palpitations, dyspnea on exertion, peripheral edema.  Respiratory: Negative for dyspnea at rest, dyspnea on exertion, cough, sputum, wheezing.  GI: See history of present illness. GU:  Negative for dysuria, hematuria, urinary incontinence, urinary frequency, nocturnal urination. C/O dark urine. MS: Negative for joint pain, low back pain.  Derm: Negative for rash or itching. Scattered red dots on lower ext, c/o itching for over one year.  Neuro: Some left sided upper ext weakness since CVA. No seizure, frequent headaches, memory loss, confusion.  Psych: Negative for anxiety, depression, suicidal ideation, hallucinations.  Endo: Negative for unusual weight change.  Heme: Negative for bruising or bleeding. Allergy: Negative for rash  or hives.       Physical Examination: Vital signs in last 24 hours: Temp:  [98.3 F (36.8 C)-99 F (37.2 C)] 98.6 F (37 C) (10/26 1300) Pulse Rate:  [94-108] 94  (10/26 1300) Resp:  [20] 20  (10/26 1300) BP: (130-165)/(85-90) 165/90 mmHg (10/26 1300) SpO2:  [93 %-98 %] 98 % (10/26 1300) Last BM Date: 12/17/10  General:  Elderly AA male appears very uncomforable. Head: Normocephalic, atraumatic.   Eyes: Conjunctiva pink, no icterus. Mouth: Oropharyngeal mucosa moist and pink , no lesions erythema or exudate. Neck: Supple without thyromegaly, masses, or lymphadenopathy.  Lungs: Clear to auscultation bilaterally.  Heart: Regular rate and rhythm, no murmurs rubs or gallops.  Abdomen: Bowel sounds are normal. Moderate tenderness throughout abdomen but more on right side. No rebound. Nondistended, no hepatosplenomegaly or masses, no abdominal bruits or hernia.   Rectal: Not performed. Extremities: No lower extremity edema, clubbing, deformity.  Neuro: Alert and oriented x 4 , grossly normal neurologically.  Skin: Warm and dry, scattered flat red spots on lower extremities. No jaundice.   Psych: Alert and cooperative, normal mood and affect.        Intake/Output from previous day: 10/25 0701 - 10/26 0700 In: 3366 [I.V.:2356; IV Piggyback:1010] Out: 300 [Urine:300] Intake/Output this shift: Total I/O In: 10 [I.V.:10] Out: -   Lab Results: CBC  Basename 12/17/10 0452 12/16/10 0550 12/15/10 1119  WBC 5.0 4.9 6.5  HGB 9.5* 10.3* 11.4*  HCT 28.6* 30.4* 33.4*  MCV 88.8 88.1 88.6  PLT 167 173 191   H/H on admission: 14.5/44.1  BMET  Basename 12/17/10 0452 12/16/10 0550 12/15/10 1119  NA 135 131* 131*  K 3.2* 3.1* 3.7  CL 104 101 100  CO2 21 22 22   GLUCOSE 81 98 79  BUN 12 10 9   CREATININE 1.26 1.09 0.83  CALCIUM 7.7* 7.6* 7.8*   LFT  Basename 12/15/10 1119  BILITOT 0.9  BILIDIR --  IBILI --  ALKPHOS 61  AST 12  ALT 5  PROT 6.4  ALBUMIN 2.4*   Lactic acid  on 12/15/10: 0.6   Imaging Studies: Ct Abdomen Pelvis Wo Contrast  12/15/2010  *RADIOLOGY REPORT*  Clinical Data: Abdominal pain.  Hypertension.  CT ABDOMEN AND PELVIS WITHOUT CONTRAST  Technique:  Multidetector CT imaging of the abdomen and pelvis was performed following the standard protocol without intravenous contrast.  Comparison: Plain films earlier today.  CT of 12/13/2010.  Findings: Minimal bibasilar atelectasis.  Normal heart size.  Trace bilateral pleural effusions are new.  Normal uninfused appearance of the liver, spleen, stomach, pancreas.  Normal gallbladder, biliary tract, adrenal glands.  No hydronephrosis.  Mild perirenal edema bilaterally.  Aortic atherosclerosis. No retroperitoneal or retrocrural adenopathy.  Scattered descending colonic diverticula.  Marked wall thickening involves the ascending colon and cecum.  Surrounding edema. Example image 49.   Air along the anterior colonic wallis favored to be between thickened haustral folds.  The terminal ileum is normal on image 45.  The appendix is felt to be identified on image 54, measures 1.1 cm.  There is surrounding edema.  Similar small volume abdominal ascites.  The edematous mid abdominal anterior small bowel loop is no longer identified.  No small bowel obstruction.  Diffuse mesenteric edema.  Apparent small bowel wall thickening on image 56, within the left lower quadrant, possibly related to underdistension.  No free intraperitoneal air.  No pelvic adenopathy.    Normal urinary bladder and prostate. Similar small volume cul-de-sac/pelvic fluid.  Severe degenerative disc disease at the lumbosacral junction.  IMPRESSION:  1.  Mildly degraded exam secondary to lack of IV contrast. 2.  Development of marked ascending colonic wall thickening.  Favor infectious colitis.  Apparent areas of gas in the region of the wall are felt to be along the adjacent haustral folds.  No specific evidence of pneumatosis or free extraluminal air.  Clinically exclude bowel ischemia 3.  Enlargement and edema surrounding the appendix.  This could be secondary to the colonic process.  Acute appendicitis cannot be excluded.  I personally called and discussed this report with Lerry Paterson, r.n.  at 8:22 p.m. on 12/15/2010. 4.  Similar abdominal pelvic ascites. 5.  Development small bilateral pleural effusions. 6.  Improved appearance of small bowel.  Underdistension versus a residual area of enteritis.  Original Report Authenticated By: Consuello Bossier, M.D.   Ct Abdomen Pelvis W Contrast  12/13/2010  *RADIOLOGY REPORT*  Clinical Data: Upper to mid abdominal pain since yesterday.  CT ABDOMEN AND PELVIS WITH CONTRAST  Technique:  Multidetector CT imaging of the abdomen and pelvis was performed following the standard protocol during bolus administration of intravenous contrast.  Contrast: OMNIPAQUE IOHEXOL 300 MG/ML IV SOLN  Comparison: Lumbar spine CT 07/31/2007.  Findings: There is mild scarring in both lung bases.  No significant pleural effusion is present.  There is a small to moderate amount of ascites throughout the peritoneal cavity.  The enteric contrast is just beginning to fill the proximal jejunum.  There is no extravasated enteric contrast or extraluminal air collection.  However, there are multiple loops of thickened small bowel in the periumbilical region.  There is surrounding mesenteric edema. The appendix appears normal.  The celiac trunk, superior and inferior mesenteric arteries appear widely patent.  There are scattered vascular calcifications.  The portal, splenic and superior mesenteric veins appear patent.  There is mild periportal edema within the liver.  A small ill- defined low density lesion is noted posteriorly in the right hepatic lobe on image number 11.  The spleen, gallbladder, pancreas, adrenal glands and kidneys appear normal.  The urinary bladder and prostate gland appear normal.  There are diffuse degenerative and postsurgical  changes of the lumbar spine status post fusion at L5-S1.  Multilevel endplate degenerative changes are present.  No acute osseous findings are seen.  IMPRESSION:  1.  Mid small bowel wall thickening in the periumbilical region, mesenteric edema and ascites of undetermined etiology.  The findings are suspicious for focal enteritis.  Microperforation cannot be excluded, although there is no extraluminal air. 2.  No evidence of large vessel occlusion or focal extraluminal fluid collection. 3.  Mild periportal edema within the liver.  No other significant solid parenchymal organ findings.  These results were called by telephone on 12/13/2010  at  1640 hours to  Dr. Adriana Simas, who verbally acknowledged these results.  Original Report Authenticated By: Gerrianne Scale, M.D.   Dg Abd 2 Views  12/15/2010  *RADIOLOGY REPORT*  Clinical Data: Progressive abdominal pain.  ABDOMEN - 2 VIEW  Comparison: CT abdomen pelvis 12/13/2010.  Findings: Supine and upright views of the abdomen demonstrate the lung bases are clear.  The contrast is now abdominally within a normal appearing colon.  The bowel gas pattern is otherwise unremarkable.  The axial skeleton is within normal limits.  IMPRESSION:  1.  Oral contrast has progressed to the colon. 2.  Nonspecific bowel gas pattern without evidence for obstruction or free air.  Original Report Authenticated By: Jamesetta Orleans. MATTERN, M.D.      Impression: 65 y/o AA male with acute onset abdominal pain of 5 days duration. He developed fever on 12/15/10 and has since been started on Cipro/Flagyl. Initial contrast CT s/o focal SB enteritis. Second CT (noncontrast) shows marked ascending colon wall thickening. He has had some nonbloody loose stools since yesterday. No stool studies  ordered. DDx includes ischemic vs infectious colitis. SMA, IMA, celiac patent on CT. Would be concerned about ischemic process based on presentation, degree of pain, lack of significant diarrhea.   Plan: #1  To discuss with Dr. Darrick Penna. Glad surgery on board as patient may need exploration. #2 Consider stool studies.  #3 Further recommendations to follow.   I would like to thank [facility] for allowing [place] to take part in the care of this nice patient.   LOS: 4 days   Tana Coast  12/17/2010, 1:57 PM  CC: Dr. Lysbeth Galas

## 2010-12-17 NOTE — Consult Note (Addendum)
Pt had sudden onset of abd pain associated with nausea at 11 pm SUN and came to ED. Initial CT: enteritis, PATENT CELIAC, SMA, AND IMA AXIS. Spike a T102.30F and CIP/FLAG added 10/24. CT right colitis and improved enteritis. TODAY-c/o WORSENING pain and LOOSE STOOLS. Has been afebrile for ~48 hours. WBC 12 to 5. Opening stool studies. No acute need for surgery or endoscopy. Sx most likely 2o to infection, less likely ischemia or IBD. Full liquids, pain control, await stool, continue ABX, and pt needs a TCS IP if pain persists and OP if Sx improve.

## 2010-12-17 NOTE — ED Provider Notes (Signed)
Medical screening examination/treatment/procedure(s) were performed by non-physician practitioner and as supervising physician I was immediately available for consultation/collaboration.   Juanpablo Ciresi L Yanelli Zapanta, MD 12/17/10 0902 

## 2010-12-17 NOTE — Progress Notes (Signed)
Subjective: Pain about the same. No significant increase in since morning when I first evaluated the patient. No increased nausea vomiting. Still with bowel function.  Objective: Vital signs in last 24 hours: Temp:  [98.3 F (36.8 C)-98.6 F (37 C)] 98.6 F (37 C) (10/26 1411) Pulse Rate:  [94-108] 94  (10/26 1411) Resp:  [20] 20  (10/26 1411) BP: (130-165)/(87-90) 165/90 mmHg (10/26 1411) SpO2:  [93 %-98 %] 98 % (10/26 1411) Last BM Date: 12/17/10  Intake/Output from previous day: 10/25 0701 - 10/26 0700 In: 3366 [I.V.:2356; IV Piggyback:1010] Out: 300 [Urine:300] Intake/Output this shift: Total I/O In: 10 [I.V.:10] Out: -   General appearance: alert and Uncomfortable appearing mild distress. GI: Soft flat, moderate to severe right lower quadrant abdominal pain. Some voluntary guarding on the right side but is somewhat distractible. No diffuse peritoneal signs elicited. No referred pain.  Lab Results:  @LABLAST2 (wbc:2,hgb:2,hct:2,plt:2) BMET  Basename 12/17/10 0452 12/16/10 0550  NA 135 131*  K 3.2* 3.1*  CL 104 101  CO2 21 22  GLUCOSE 81 98  BUN 12 10  CREATININE 1.26 1.09  CALCIUM 7.7* 7.6*   PT/INR No results found for this basename: LABPROT:2,INR:2 in the last 72 hours ABG No results found for this basename: PHART:2,PCO2:2,PO2:2,HCO3:2 in the last 72 hours  Studies/Results: Ct Abdomen Pelvis Wo Contrast  12/15/2010  *RADIOLOGY REPORT*  Clinical Data: Abdominal pain.  Hypertension.  CT ABDOMEN AND PELVIS WITHOUT CONTRAST  Technique:  Multidetector CT imaging of the abdomen and pelvis was performed following the standard protocol without intravenous contrast.  Comparison: Plain films earlier today.  CT of 12/13/2010.  Findings: Minimal bibasilar atelectasis.  Normal heart size.  Trace bilateral pleural effusions are new.  Normal uninfused appearance of the liver, spleen, stomach, pancreas.  Normal gallbladder, biliary tract, adrenal glands.  No hydronephrosis.   Mild perirenal edema bilaterally.  Aortic atherosclerosis. No retroperitoneal or retrocrural adenopathy.  Scattered descending colonic diverticula.  Marked wall thickening involves the ascending colon and cecum.  Surrounding edema. Example image 49.   Air along the anterior colonic wallis favored to be between thickened haustral folds.  The terminal ileum is normal on image 45.  The appendix is felt to be identified on image 54, measures 1.1 cm.  There is surrounding edema.  Similar small volume abdominal ascites.  The edematous mid abdominal anterior small bowel loop is no longer identified.  No small bowel obstruction.  Diffuse mesenteric edema.  Apparent small bowel wall thickening on image 56, within the left lower quadrant, possibly related to underdistension.  No free intraperitoneal air.  No pelvic adenopathy.    Normal urinary bladder and prostate. Similar small volume cul-de-sac/pelvic fluid.  Severe degenerative disc disease at the lumbosacral junction.  IMPRESSION:  1.  Mildly degraded exam secondary to lack of IV contrast. 2.  Development of marked ascending colonic wall thickening.  Favor infectious colitis.  Apparent areas of gas in the region of the wall are felt to be along the adjacent haustral folds.  No specific evidence of pneumatosis or free extraluminal air. Clinically exclude bowel ischemia 3.  Enlargement and edema surrounding the appendix.  This could be secondary to the colonic process.  Acute appendicitis cannot be excluded.  I personally called and discussed this report with Lerry Paterson, r.n.  at 8:22 p.m. on 12/15/2010. 4.  Similar abdominal pelvic ascites. 5.  Development small bilateral pleural effusions. 6.  Improved appearance of small bowel.  Underdistension versus a residual area of enteritis.  Original Report Authenticated By: Consuello Bossier, M.D.   Dg Abd 2 Views  12/17/2010  *RADIOLOGY REPORT*  Clinical Data: Abdominal pain.  Diarrhea.  ABDOMEN - 2 VIEW  Comparison: 12/15/2010  CT.  Findings: Upright view is limited with respect to evaluating for free air below right hemidiaphragm secondary to technique.  No obvious free intraperitoneal air is noted.  Nonspecific bowel gas pattern with small amount of residual contrast from recent CT within portions of the colon.  Transverse colon gas filled measuring up to 5.9 cm with folds appear minimally prominent.  Tortuous descending thoracic aorta.  IMPRESSION: Nonspecific bowel gas pattern without evidence of bowel obstruction.  Original Report Authenticated By: Fuller Canada, M.D.    Anti-infectives: Anti-infectives    None      Assessment/Plan: s/p  Acute colonic process. At this point I still have a high suspicion of a infectious or ischemic process involving the ascending colon and cecum. surgically the patient has not change significantly since this morning and will continue to monitor closely. His exam has improved slightly since yesterday although I did inform the patient and he is still a considerable risk for needing to proceed to the operating room for exploration. At this time continue the patient n.p.o. status continue IV fluid continue IV antibiotics and continue pain control. Again should patient's symptomatology worsen, and his leukocytosis increase, her vital signs change then at that time we would proceed as discussed with the patient to the operating room for exploration, bowel resection, and likely ostomy placement. We'll continue to follow the patient's course extremely closely.  LOS: 4 days    Georganna Maxson C 12/17/2010

## 2010-12-17 NOTE — Telephone Encounter (Signed)
Error

## 2010-12-17 NOTE — Progress Notes (Signed)
Subjective: And is still coming complaining of abdominal pain-but slightly better than yesterday. Has numerous bowel movements today  Objective: Vital signs in last 24 hours: Temp:  [98.3 F (36.8 C)-99 F (37.2 C)] 98.6 F (37 C) (10/26 0700) Pulse Rate:  [97-108] 108  (10/26 0700) Resp:  [20] 20  (10/26 0700) BP: (130-150)/(85-89) 130/89 mmHg (10/26 0700) SpO2:  [93 %-96 %] 96 % (10/26 0700) Weight change:  Body mass index is 23.75 kg/(m^2).  Intake/Output from previous day: 10/25 0701 - 10/26 0700 In: 3366 [I.V.:2356; IV Piggyback:1010] Out: 300 [Urine:300]   PHYSICAL EXAM: Gen Exam: Awake and alert with clear speech.   Neck: Supple, No JVD.  Chest: B/L Clear.  CVS: S1 S2 Regular, no murmurs.  Abdomen: soft, BS sluggish. Patient has tenderness in the epigastric and right mid to lower quadrant region. Patient also has significant guarding in that region as well.  Extremities: no edema, warm.   Neurologic: Non Focal.   Skin: No Rash.   Wounds: N/A.    Lab Results:  Basename 12/17/10 0452 12/16/10 0550  WBC 5.0 4.9  HGB 9.5* 10.3*  HCT 28.6* 30.4*  PLT 167 173   CMET CMP     Component Value Date/Time   NA 135 12/17/2010 0452   K 3.2* 12/17/2010 0452   CL 104 12/17/2010 0452   CO2 21 12/17/2010 0452   GLUCOSE 81 12/17/2010 0452   BUN 12 12/17/2010 0452   CREATININE 1.26 12/17/2010 0452   CALCIUM 7.7* 12/17/2010 0452   PROT 6.4 12/15/2010 1119   ALBUMIN 2.4* 12/15/2010 1119   AST 12 12/15/2010 1119   ALT 5 12/15/2010 1119   ALKPHOS 61 12/15/2010 1119   BILITOT 0.9 12/15/2010 1119   GFRNONAA 59* 12/17/2010 0452   GFRAA 68* 12/17/2010 0452    Studies/Results: Ct Abdomen Pelvis W Contrast  12/13/2010  *RADIOLOGY REPORT*  Clinical Data: Upper to mid abdominal pain since yesterday.  CT ABDOMEN AND PELVIS WITH CONTRAST  Technique:  Multidetector CT imaging of the abdomen and pelvis was performed following the standard protocol during bolus administration of  intravenous contrast.  Contrast: OMNIPAQUE IOHEXOL 300 MG/ML IV SOLN  Comparison: Lumbar spine CT 07/31/2007.  Findings: There is mild scarring in both lung bases.  No significant pleural effusion is present.  There is a small to moderate amount of ascites throughout the peritoneal cavity.  The enteric contrast is just beginning to fill the proximal jejunum.  There is no extravasated enteric contrast or extraluminal air collection.  However, there are multiple loops of thickened small bowel in the periumbilical region.  There is surrounding mesenteric edema. The appendix appears normal.  The celiac trunk, superior and inferior mesenteric arteries appear widely patent.  There are scattered vascular calcifications.  The portal, splenic and superior mesenteric veins appear patent.  There is mild periportal edema within the liver.  A small ill- defined low density lesion is noted posteriorly in the right hepatic lobe on image number 11.  The spleen, gallbladder, pancreas, adrenal glands and kidneys appear normal.  The urinary bladder and prostate gland appear normal.  There are diffuse degenerative and postsurgical changes of the lumbar spine status post fusion at L5-S1.  Multilevel endplate degenerative changes are present.  No acute osseous findings are seen.  IMPRESSION:  1.  Mid small bowel wall thickening in the periumbilical region, mesenteric edema and ascites of undetermined etiology.  The findings are suspicious for focal enteritis.  Microperforation cannot be excluded, although there  is no extraluminal air. 2.  No evidence of large vessel occlusion or focal extraluminal fluid collection. 3.  Mild periportal edema within the liver.  No other significant solid parenchymal organ findings.  These results were called by telephone on 12/13/2010  at  1640 hours to  Dr. Adriana Simas, who verbally acknowledged these results.  Original Report Authenticated By: Gerrianne Scale, M.D.    Medications: I have reviewed his  medications for this inpatient stay  Assessment/Plan: Patient Active Hospital Problem List:  Right-sided colitis    Assessment: Repeat CT scan of the abdomen done on 10/24 shows marked ascending colonic wall thickening. It also showed enlargement and edema surrounding the appendix. He did have mild leukocytosis on admission. His leukocytosis has resolved but the blood work done done today. However he continues to have abdominal pain. He also had a fever 10/24 evening and as a result has been started on ciprofloxacin and Flagyl. This is likely to be infectious colitis.    Plan: We'll continue to keep him n.p.o. continue his IV fluids. We'll continue his ciprofloxacin and Flagyl. Surgery consultation appreciated, we will continue to follow postsurgical course to see if he is going to require a laparotomy.   Abdominal pain, acute, right-sided    Assessment: This is from his ongoing colitis.   Plan: As above, will continue as needed narcotics.  Hypertension Patient does give a history of hypertension, however he is not on any medications at home. He is on IV fluids,his blood pressures are slightly better. I will use IV hydralazine as needed basis. Once his intake is resumed and stable I will start him on antihypertensive medications.  Disposition Remain an inpatient     LOS: 4 days   Maretta Bees, MD. 12/17/2010, 11:51 AM

## 2010-12-17 NOTE — Progress Notes (Signed)
Subjective: Pain remains about the same. Denies any nausea and vomiting. Positive loose bowel movements. No hematochezia. No frank blood. Denies any fevers or chills.  Objective: Vital signs in last 24 hours: Temp:  [98.3 F (36.8 [name])-99 F (37.2 [name])] 98.6 F (37 [name]) (10/26 0700) Pulse Rate:  [97-108] 108  (10/26 0700) Resp:  [20] 20  (10/26 0700) BP: (130-150)/(85-89) 130/89 mmHg (10/26 0700) SpO2:  [93 %-96 %] 96 % (10/26 0700) Last BM Date: 12/16/10  Intake/Output from previous day: 10/25 0701 - 10/26 0700 In: 3366 [I.V.:2356; IV Piggyback:1010] Out: 300 [Urine:300] Intake/Output this shift:    General appearance: alert and mild distress Resp: clear to auscultation bilaterally Cardio: Tachycardia rate regular rhythm GI: Quiet, soft flat, moderate to severe right lower quadrant abdominal pain. Some voluntary guarding on the side. No diffuse peritoneal signs. No masses apparent.  Lab Results:  @LABLAST2 (wbc:2,hgb:2,hct:2,plt:2) BMET  Basename 12/17/10 0452 12/16/10 0550  NA 135 131*  K 3.2* 3.1*  CL 104 101  CO2 21 22  GLUCOSE 81 98  BUN 12 10  CREATININE 1.26 1.09  CALCIUM 7.7* 7.6*   PT/INR No results found for this basename: LABPROT:2,INR:2 in the last 72 hours ABG No results found for this basename: PHART:2,PCO2:2,PO2:2,HCO3:2 in the last 72 hours  Studies/Results: Ct Abdomen Pelvis Wo Contrast  12/15/2010  *RADIOLOGY REPORT*  Clinical Data: Abdominal pain.  Hypertension.  CT ABDOMEN AND PELVIS WITHOUT CONTRAST  Technique:  Multidetector CT imaging of the abdomen and pelvis was performed following the standard protocol without intravenous contrast.  Comparison: Plain films earlier today.  CT of 12/13/2010.  Findings: Minimal bibasilar atelectasis.  Normal heart size.  Trace bilateral pleural effusions are new.  Normal uninfused appearance of the liver, spleen, stomach, pancreas.  Normal gallbladder, biliary tract, adrenal glands.  No hydronephrosis.  Mild  perirenal edema bilaterally.  Aortic atherosclerosis. No retroperitoneal or retrocrural adenopathy.  Scattered descending colonic diverticula.  Marked wall thickening involves the ascending colon and cecum.  Surrounding edema. Example image 49.   Air along the anterior colonic wallis favored to be between thickened haustral folds.  The terminal ileum is normal on image 45.  The appendix is felt to be identified on image 54, measures 1.1 cm.  There is surrounding edema.  Similar small volume abdominal ascites.  The edematous mid abdominal anterior small bowel loop is no longer identified.  No small bowel obstruction.  Diffuse mesenteric edema.  Apparent small bowel wall thickening on image 56, within the left lower quadrant, possibly related to underdistension.  No free intraperitoneal air.  No pelvic adenopathy.    Normal urinary bladder and prostate. Similar small volume cul-de-sac/pelvic fluid.  Severe degenerative disc disease at the lumbosacral junction.  IMPRESSION:  1.  Mildly degraded exam secondary to lack of IV contrast. 2.  Development of marked ascending colonic wall thickening.  Favor infectious colitis.  Apparent areas of gas in the region of the wall are felt to be along the adjacent haustral folds.  No specific evidence of pneumatosis or free extraluminal air. Clinically exclude bowel ischemia 3.  Enlargement and edema surrounding the appendix.  This could be secondary to the colonic process.  Acute appendicitis cannot be excluded.  I personally called and discussed this report with Lerry Paterson, r.n.  at 8:22 p.m. on 12/15/2010. 4.  Similar abdominal pelvic ascites. 5.  Development small bilateral pleural effusions. 6.  Improved appearance of small bowel.  Underdistension versus a residual area of enteritis.  Original Report Authenticated  By: Consuello Bossier, M.D.   Dg Abd 2 Views  12/15/2010  *RADIOLOGY REPORT*  Clinical Data: Progressive abdominal pain.  ABDOMEN - 2 VIEW  Comparison: CT abdomen  pelvis 12/13/2010.  Findings: Supine and upright views of the abdomen demonstrate the lung bases are clear.  The contrast is now abdominally within a normal appearing colon.  The bowel gas pattern is otherwise unremarkable.  The axial skeleton is within normal limits.  IMPRESSION:  1.  Oral contrast has progressed to the colon. 2.  Nonspecific bowel gas pattern without evidence for obstruction or free air.  Original Report Authenticated By: Jamesetta Orleans. MATTERN, M.D.    Anti-infectives: Anti-infectives    None      Assessment/Plan: s/p  Right lower quadrant pain, colitis(infectious versus ischemic). Exam remains about the same. Continue patient in [place] status. Continue IV fluid. Continue IV antibiotics. Again discussed with the patient surgical indications. Continue to closely follow him through the day. She does exam change patient understands that proceeding to the operating room for exploration may be required. We'll continue to follow the patient closely.  LOS: 4 days    Brian [name] 12/17/2010

## 2010-12-18 ENCOUNTER — Encounter (HOSPITAL_COMMUNITY): Payer: Self-pay | Admitting: Anesthesiology

## 2010-12-18 ENCOUNTER — Other Ambulatory Visit: Payer: Self-pay | Admitting: General Surgery

## 2010-12-18 ENCOUNTER — Inpatient Hospital Stay (HOSPITAL_COMMUNITY): Payer: Medicare Other

## 2010-12-18 ENCOUNTER — Encounter (HOSPITAL_COMMUNITY)
Admission: EM | Disposition: A | Discharge status: Other Acute Inpatient Hospital | Payer: Self-pay | Source: Home / Self Care | Attending: Internal Medicine

## 2010-12-18 ENCOUNTER — Inpatient Hospital Stay (HOSPITAL_COMMUNITY): Payer: Medicare Other | Admitting: Anesthesiology

## 2010-12-18 DIAGNOSIS — R109 Unspecified abdominal pain: Secondary | ICD-10-CM

## 2010-12-18 DIAGNOSIS — K5289 Other specified noninfective gastroenteritis and colitis: Secondary | ICD-10-CM

## 2010-12-18 HISTORY — PX: PARTIAL COLECTOMY: SHX5273

## 2010-12-18 HISTORY — PX: LAPAROTOMY: SHX154

## 2010-12-18 LAB — CBC
Hemoglobin: 11.1 g/dL — ABNORMAL LOW (ref 13.0–17.0)
MCH: 30.1 pg (ref 26.0–34.0)
MCV: 87.8 fL (ref 78.0–100.0)
RBC: 3.69 MIL/uL — ABNORMAL LOW (ref 4.22–5.81)

## 2010-12-18 LAB — BASIC METABOLIC PANEL
CO2: 19 mEq/L (ref 19–32)
Calcium: 7.4 mg/dL — ABNORMAL LOW (ref 8.4–10.5)
Chloride: 102 mEq/L (ref 96–112)
Creatinine, Ser: 1.63 mg/dL — ABNORMAL HIGH (ref 0.50–1.35)
Glucose, Bld: 104 mg/dL — ABNORMAL HIGH (ref 70–99)
Sodium: 132 mEq/L — ABNORMAL LOW (ref 135–145)

## 2010-12-18 SURGERY — LAPAROTOMY, EXPLORATORY
Anesthesia: General | Site: Abdomen | Wound class: Contaminated

## 2010-12-18 MED ORDER — ONDANSETRON HCL 4 MG/2ML IJ SOLN
INTRAMUSCULAR | Status: AC
  Filled 2010-12-18: qty 2

## 2010-12-18 MED ORDER — PANTOPRAZOLE SODIUM 40 MG IV SOLR
40.0000 mg | INTRAVENOUS | Status: DC
  Administered 2010-12-18 – 2010-12-23 (×6): 40 mg via INTRAVENOUS
  Filled 2010-12-18 (×6): qty 40

## 2010-12-18 MED ORDER — FENTANYL CITRATE 0.05 MG/ML IJ SOLN
INTRAMUSCULAR | Status: AC
  Filled 2010-12-18: qty 2

## 2010-12-18 MED ORDER — SUCCINYLCHOLINE CHLORIDE 20 MG/ML IJ SOLN
INTRAMUSCULAR | Status: AC
  Filled 2010-12-18: qty 1

## 2010-12-18 MED ORDER — LIDOCAINE HCL 1 % IJ SOLN
INTRAMUSCULAR | Status: DC | PRN
Start: 2010-12-18 — End: 2010-12-18
  Administered 2010-12-18: 25 mg via INTRADERMAL

## 2010-12-18 MED ORDER — LACTATED RINGERS IV SOLN
INTRAVENOUS | Status: DC | PRN
  Administered 2010-12-18 (×3): via INTRAVENOUS

## 2010-12-18 MED ORDER — SODIUM CHLORIDE 0.9 % IR SOLN
Status: DC | PRN
  Administered 2010-12-18: 1000 mL

## 2010-12-18 MED ORDER — GLYCOPYRROLATE 0.2 MG/ML IJ SOLN
INTRAMUSCULAR | Status: DC | PRN
  Administered 2010-12-18: .4 mg via INTRAVENOUS

## 2010-12-18 MED ORDER — SUCCINYLCHOLINE CHLORIDE 20 MG/ML IJ SOLN
INTRAMUSCULAR | Status: DC | PRN
  Administered 2010-12-18: 130 mg via INTRAVENOUS

## 2010-12-18 MED ORDER — LIDOCAINE HCL (PF) 1 % IJ SOLN
INTRAMUSCULAR | Status: AC
  Filled 2010-12-18: qty 5

## 2010-12-18 MED ORDER — ROCURONIUM BROMIDE 100 MG/10ML IV SOLN
INTRAVENOUS | Status: DC | PRN
  Administered 2010-12-18: 10 mg via INTRAVENOUS
  Administered 2010-12-18: 20 mg via INTRAVENOUS

## 2010-12-18 MED ORDER — MIDAZOLAM HCL 2 MG/2ML IJ SOLN
INTRAMUSCULAR | Status: AC
  Filled 2010-12-18: qty 2

## 2010-12-18 MED ORDER — ONDANSETRON HCL 4 MG/2ML IJ SOLN
INTRAMUSCULAR | Status: DC | PRN
  Administered 2010-12-18: 4 mg via INTRAVENOUS

## 2010-12-18 MED ORDER — NEOSTIGMINE METHYLSULFATE 1 MG/ML IJ SOLN
INTRAMUSCULAR | Status: DC | PRN
  Administered 2010-12-18: 1 mg via INTRAVENOUS
  Administered 2010-12-18: 3 mg via INTRAVENOUS

## 2010-12-18 MED ORDER — FENTANYL CITRATE 0.05 MG/ML IJ SOLN
INTRAMUSCULAR | Status: DC | PRN
  Administered 2010-12-18 (×2): 100 ug via INTRAVENOUS
  Administered 2010-12-18 (×2): 50 ug via INTRAVENOUS
  Administered 2010-12-18: 100 ug via INTRAVENOUS
  Administered 2010-12-18 (×2): 50 ug via INTRAVENOUS
  Administered 2010-12-18: 100 ug via INTRAVENOUS

## 2010-12-18 MED ORDER — ROCURONIUM BROMIDE 50 MG/5ML IV SOLN
INTRAVENOUS | Status: AC
  Filled 2010-12-18: qty 1

## 2010-12-18 MED ORDER — PROPOFOL 10 MG/ML IV EMUL
INTRAVENOUS | Status: DC | PRN
  Administered 2010-12-18: 140 mg via INTRAVENOUS

## 2010-12-18 MED ORDER — FENTANYL CITRATE 0.05 MG/ML IJ SOLN
INTRAMUSCULAR | Status: AC
  Filled 2010-12-18: qty 5

## 2010-12-18 MED ORDER — NEOSTIGMINE METHYLSULFATE 1 MG/ML IJ SOLN
INTRAMUSCULAR | Status: AC
  Filled 2010-12-18: qty 10

## 2010-12-18 MED ORDER — PANTOPRAZOLE SODIUM 40 MG PO TBEC
40.0000 mg | DELAYED_RELEASE_TABLET | Freq: Every day | ORAL | Status: DC
  Filled 2010-12-18: qty 1

## 2010-12-18 MED ORDER — PROPOFOL 10 MG/ML IV EMUL
INTRAVENOUS | Status: AC
  Filled 2010-12-18: qty 20

## 2010-12-18 MED ORDER — MIDAZOLAM HCL 5 MG/ML IJ SOLN
INTRAMUSCULAR | Status: DC | PRN
  Administered 2010-12-18: 2 mg via INTRAVENOUS

## 2010-12-18 SURGICAL SUPPLY — 75 items
APL SKNCLS STERI-STRIP NONHPOA (GAUZE/BANDAGES/DRESSINGS)
APPLIER CLIP 11 MED OPEN (CLIP)
APPLIER CLIP 13 LRG OPEN (CLIP)
APR CLP LRG 13 20 CLIP (CLIP)
APR CLP MED 11 20 MLT OPN (CLIP)
BAG HAMPER (MISCELLANEOUS) ×2 IMPLANT
BAG UROSTOMY 4 LOOP 90 STRL (OSTOMY) IMPLANT
BARRIER SKIN 2 3/4 (OSTOMY) IMPLANT
BARRIER SKIN OD2.25 2 3/4 FLNG (OSTOMY) IMPLANT
BENZOIN TINCTURE PRP APPL 2/3 (GAUZE/BANDAGES/DRESSINGS) ×1 IMPLANT
BRR SKN FLT 2.75X2.25 2 PC (OSTOMY)
CLAMP POUCH DRAINAGE QUIET (OSTOMY) IMPLANT
CLIP APPLIE 11 MED OPEN (CLIP) IMPLANT
CLIP APPLIE 13 LRG OPEN (CLIP) IMPLANT
CLOTH BEACON ORANGE TIMEOUT ST (SAFETY) ×2 IMPLANT
COVER LIGHT HANDLE STERIS (MISCELLANEOUS) ×4 IMPLANT
DRAPE WARM FLUID 44X44 (DRAPE) ×2 IMPLANT
DURAPREP 26ML APPLICATOR (WOUND CARE) ×2 IMPLANT
ELECT BLADE 6 FLAT ULTRCLN (ELECTRODE) ×2 IMPLANT
ELECT REM PT RETURN 9FT ADLT (ELECTROSURGICAL) ×2
ELECTRODE REM PT RTRN 9FT ADLT (ELECTROSURGICAL) ×1 IMPLANT
FORMALIN 10 PREFIL 480ML (MISCELLANEOUS) IMPLANT
GLOVE BIOGEL PI IND STRL 7.0 (GLOVE) IMPLANT
GLOVE BIOGEL PI IND STRL 7.5 (GLOVE) ×1 IMPLANT
GLOVE BIOGEL PI INDICATOR 7.0 (GLOVE) ×2
GLOVE BIOGEL PI INDICATOR 7.5 (GLOVE) ×1
GLOVE ECLIPSE 7.0 STRL STRAW (GLOVE) ×2 IMPLANT
GLOVE OPTIFIT SS 6.5 STRL BRWN (GLOVE) ×1 IMPLANT
GOWN BRE IMP SLV AUR XL STRL (GOWN DISPOSABLE) ×6 IMPLANT
INST SET MAJOR GENERAL (KITS) ×2 IMPLANT
KIT ROOM TURNOVER APOR (KITS) ×2 IMPLANT
LIGASURE IMPACT 36 18CM CVD LR (INSTRUMENTS) ×1 IMPLANT
MANIFOLD NEPTUNE II (INSTRUMENTS) ×2 IMPLANT
NS IRRIG 1000ML POUR BTL (IV SOLUTION) ×4 IMPLANT
PACK ABDOMINAL MAJOR (CUSTOM PROCEDURE TRAY) ×2 IMPLANT
PAD ABD 5X9 TENDERSORB (GAUZE/BANDAGES/DRESSINGS) ×1 IMPLANT
PAD ARMBOARD 7.5X6 YLW CONV (MISCELLANEOUS) ×3 IMPLANT
POUCH OSTOMY 2 3/4  H 3804 (WOUND CARE)
POUCH OSTOMY 2 3/4 H 3804 (WOUND CARE)
POUCH OSTOMY 2 PC DRNBL 2.25 (WOUND CARE) IMPLANT
POUCH OSTOMY 2 PC DRNBL 2.75 (WOUND CARE) IMPLANT
POUCH OSTOMY DRNBL 2 1/4 (WOUND CARE)
RELOAD LINEAR CUT PROX 55 BLUE (ENDOMECHANICALS) ×4 IMPLANT
RELOAD PROXIMATE 75MM BLUE (ENDOMECHANICALS) IMPLANT
RELOAD STAPLE 55 3.8 BLU REG (ENDOMECHANICALS) IMPLANT
RELOAD STAPLE 75 3.8 BLU REG (ENDOMECHANICALS) IMPLANT
RETRACTOR WND ALEXIS 25 LRG (MISCELLANEOUS) IMPLANT
RETRACTOR WOUND ALXS 34CM XLRG (MISCELLANEOUS) IMPLANT
RTRCTR WOUND ALEXIS 25CM LRG (MISCELLANEOUS)
RTRCTR WOUND ALEXIS 34CM XLRG (MISCELLANEOUS)
SEALER TISSUE G2 CVD JAW 35 (ENDOMECHANICALS) IMPLANT
SEALER TISSUE G2 CVD JAW 45CM (ENDOMECHANICALS) ×1
SET BASIN LINEN APH (SET/KITS/TRAYS/PACK) ×2 IMPLANT
SPONGE GAUZE 4X4 12PLY (GAUZE/BANDAGES/DRESSINGS) ×2 IMPLANT
SPONGE LAP 18X18 X RAY DECT (DISPOSABLE) IMPLANT
STAPLER AUT SUT LDS 15W (STAPLE) IMPLANT
STAPLER GUN LINEAR PROX 60 (STAPLE) IMPLANT
STAPLER PROXIMATE 55 BLUE (STAPLE) ×1 IMPLANT
STAPLER PROXIMATE 75MM BLUE (STAPLE) IMPLANT
STAPLER VISISTAT 35W (STAPLE) ×2 IMPLANT
SUCTION POOLE TIP (SUCTIONS) ×2 IMPLANT
SUT CHROMIC 0 SH (SUTURE) IMPLANT
SUT CHROMIC 2 0 SH (SUTURE) IMPLANT
SUT CHROMIC 3 0 SH 27 (SUTURE) ×1 IMPLANT
SUT NOVA NAB GS-26 0 60 (SUTURE) ×3 IMPLANT
SUT SILK 2 0 (SUTURE)
SUT SILK 2-0 18XBRD TIE 12 (SUTURE) ×1 IMPLANT
SUT SILK 3 0 SH CR/8 (SUTURE) ×4 IMPLANT
SUT VIC AB 2-0 CT1 27 (SUTURE)
SUT VIC AB 2-0 CT1 TAPERPNT 27 (SUTURE) ×2 IMPLANT
SYR BULB IRRIGATION 50ML (SYRINGE) ×2 IMPLANT
TAPE CLOTH SURG 4X10 WHT LF (GAUZE/BANDAGES/DRESSINGS) ×1 IMPLANT
TOWEL BLUE STERILE X RAY DET (MISCELLANEOUS) ×1 IMPLANT
TOWEL OR 17X26 4PK STRL BLUE (TOWEL DISPOSABLE) ×2 IMPLANT
TRAY FOLEY CATH 14FR (SET/KITS/TRAYS/PACK) ×2 IMPLANT

## 2010-12-18 NOTE — Progress Notes (Signed)
Subjective: Patient states she feels terrible. Having the worst pain of his illness today. Some nausea and vomiting (diet backed off to n.p.o.). He has had some nonbloody watery diarrhea.  Objective: Vital signs in last 24 hours: Temp:  [98.6 F (37 C)-99 F (37.2 C)] 98.8 F (37.1 C) 12-28-22 0658) Pulse Rate:  [94-105] 105  2022-12-28 0658) Resp:  [20] 20  28-Dec-2022 0658) BP: (165-205)/(90-120) 167/98 mmHg December 28, 2022 0658) SpO2:  [96 %-98 %] 96 % Dec 28, 2022 0658) Last BM Date: 12/17/10 General:   Alert,  conversant. Appears sick. Abdomen:  Full. bowel sounds infrequent.  He is diffusely tender to moderately deep palpation (right side greater than left side). Marked guarding present. I believe he does have some rebound tenderness right lower quadrant. Extremities:  Without clubbing or edema.    Intake/Output from previous day: 10/26 0701 - December 28, 2022 0700 In: 840 [I.V.:540; IV Piggyback:300] Out: -  Intake/Output this shift:    Lab Results:  Basename 12/28/2010 0620 12/17/10 0452 12/16/10 0550  WBC 8.6 5.0 4.9  HGB 11.1* 9.5* 10.3*  HCT 32.4* 28.6* 30.4*  PLT 178 167 173   BMET  Basename December 28, 2010 0620 12/17/10 0452 12/16/10 0550  NA 132* 135 131*  K 3.5 3.2* 3.1*  CL 102 104 101  CO2 19 21 22   GLUCOSE 104* 81 98  BUN 18 12 10   CREATININE 1.63* 1.26 1.09  CALCIUM 7.4* 7.7* 7.6*     LStudies/Results: Dg Abd 2 Views  12-28-10  *RADIOLOGY REPORT*  Clinical Data: Pain and distention  ABDOMEN - 2 VIEW  Comparison:   the previous day's study  Findings: No free air on the erect film.  Visualized lung bases clear.  Gas filled nondilated small bowel loops scattered throughout the abdomen.  The colon is decompressed.  Minimal degenerative changes in the lumbar spine.  IMPRESSION: 1.  Nonobstructive bowel gas pattern.  No free air.  Original Report Authenticated By: Osa Craver, M.D.   Dg Abd 2 Views  12/17/2010  *RADIOLOGY REPORT*  Clinical Data: Abdominal pain.  Diarrhea.  ABDOMEN -  2 VIEW  Comparison: 12/15/2010 CT.  Findings: Upright view is limited with respect to evaluating for free air below right hemidiaphragm secondary to technique.  No obvious free intraperitoneal air is noted.  Nonspecific bowel gas pattern with small amount of residual contrast from recent CT within portions of the colon.  Transverse colon gas filled measuring up to 5.9 cm with folds appear minimally prominent.  Tortuous descending thoracic aorta.  IMPRESSION: Nonspecific bowel gas pattern without evidence of bowel obstruction.  Original Report Authenticated By: Fuller Canada, M.D.    Assessment:  I have reviewed his imaging studies to date. He has not behaving as a straightforward infectious enterocolitis. Pain out of proportion to that entity. Mesenteric ischemia is the other major possibility although clinical course thus far is atypical for that entity as well.  He does have guarding and some rebound tenderness. I note his bicarbonate has been trending downward over the past couple of days.  Certainly, he does not have any thumbprinting on his recent plain films which is good.  Patient may be best served by surgical exploration in the near future. I discussed my findings and concerns with Dr. Leticia Penna who will be seeing the patient again in the near future. Agree with making the patient n.p.o. and going back to parenteral analgesics.       LOS: 5 days   Eula Listen  December 28, 2010, 12:16 PM

## 2010-12-18 NOTE — Anesthesia Postprocedure Evaluation (Signed)
  Anesthesia Post-op Note  Patient: Brian [name]  Procedure(s) Performed:  EXPLORATORY LAPAROTOMY; PARTIAL COLECTOMY  Patient Location: PACU  Anesthesia Type: General  Level of Consciousness: awake, oriented and patient cooperative  Airway and Oxygen Therapy: Patient Spontanous Breathing and Patient connected to face mask oxygen  Post-op Pain: 5 /10, moderate  Post-op Assessment: Post-op Vital signs reviewed, Patient's Cardiovascular Status Stable, Respiratory Function Stable, Patent Airway and No signs of Nausea or vomiting  Post-op Vital Signs: Reviewed and stable  Complications: No apparent anesthesia complications

## 2010-12-18 NOTE — Progress Notes (Signed)
  Subjective: Abdominal pain persists.  Some new emesis last PM.  BM remains loose.  No blood.  Objective: Vital signs in last 24 hours: Temp:  [98.6 F (37 C)-99 F (37.2 C)] 98.8 F (37.1 C) 12/27/2022 0658) Pulse Rate:  [94-105] 105  2022/12/27 0658) Resp:  [20] 20  2022-12-27 0658) BP: (165-205)/(90-120) 167/98 mmHg 27-Dec-2022 0658) SpO2:  [96 %-98 %] 96 % 12/27/22 0658) Last BM Date: 12/17/10  Intake/Output from previous day: 10/26 0701 2022-12-27 0700 In: 840 [I.V.:540; IV Piggyback:300] Out: -  Intake/Output this shift:    General appearance: alert and moderate distress GI: Soft, flat, mod-severe RLQ pain.  +guarding.  Some distractablility.  No diffuse peritoneal signs.  Lab Results:  @LABLAST2 (wbc:2,hgb:2,hct:2,plt:2) BMET  Basename 12/27/2010 0620 12/17/10 0452  NA 132* 135  K 3.5 3.2*  CL 102 104  CO2 19 21  GLUCOSE 104* 81  BUN 18 12  CREATININE 1.63* 1.26  CALCIUM 7.4* 7.7*   PT/INR No results found for this basename: LABPROT:2,INR:2 in the last 72 hours ABG No results found for this basename: PHART:2,PCO2:2,PO2:2,HCO3:2 in the last 72 hours  Studies/Results: Dg Abd 2 Views  27-Dec-2010  *RADIOLOGY REPORT*  Clinical Data: Pain and distention  ABDOMEN - 2 VIEW  Comparison:   the previous day's study  Findings: No free air on the erect film.  Visualized lung bases clear.  Gas filled nondilated small bowel loops scattered throughout the abdomen.  The colon is decompressed.  Minimal degenerative changes in the lumbar spine.  IMPRESSION: 1.  Nonobstructive bowel gas pattern.  No free air.  Original Report Authenticated By: Osa Craver, M.D.   Dg Abd 2 Views  12/17/2010  *RADIOLOGY REPORT*  Clinical Data: Abdominal pain.  Diarrhea.  ABDOMEN - 2 VIEW  Comparison: 12/15/2010 CT.  Findings: Upright view is limited with respect to evaluating for free air below right hemidiaphragm secondary to technique.  No obvious free intraperitoneal air is noted.  Nonspecific bowel gas  pattern with small amount of residual contrast from recent CT within portions of the colon.  Transverse colon gas filled measuring up to 5.9 cm with folds appear minimally prominent.  Tortuous descending thoracic aorta.  IMPRESSION: Nonspecific bowel gas pattern without evidence of bowel obstruction.  Original Report Authenticated By: Fuller Canada, M.D.    Anti-infectives: Anti-infectives    None      Assessment/Plan: s/p Procedure(s): EXPLORATORY LAPAROTOMY PARTIAL COLECTOMY Pain persists some increase.  New episdoses of emesis.  Discussed with patient ex lap.  Will proceed.  Possible bowel resection.   LOS: 5 days    Kristell Wooding C 2010-12-27

## 2010-12-18 NOTE — Transfer of Care (Signed)
Immediate Anesthesia Transfer of Care Note  Patient: Brian [name]  Procedure(s) Performed:  EXPLORATORY LAPAROTOMY; PARTIAL COLECTOMY  Patient Location: PACU  Anesthesia Type: General  Level of Consciousness: awake and confused  Airway & Oxygen Therapy: Patient Spontanous Breathing and Patient connected to face mask oxygen  Post-op Assessment: Report given to PACU RN, Post -op Vital signs reviewed and stable and Patient moving all extremities X 4  Post vital signs: Reviewed and stable  Complications: No apparent anesthesia complications

## 2010-12-18 NOTE — Anesthesia Preprocedure Evaluation (Addendum)
Anesthesia Evaluation   Patient awake  General Assessment Comment  Reviewed: Allergy & Precautions  Airway Mallampati: I TM Distance: <3 FB Neck ROM: full    Dental  (+) Edentulous Upper and Poor Dentition   Pulmonary former smoker clear to auscultation        Cardiovascular hypertension, regular     Neuro/Psych    GI/Hepatic GERD Controlled  Endo/Other    Renal/GU      Musculoskeletal   Abdominal (+)  Abdomen: soft.    Peds  Hematology   Anesthesia Other Findings   Reproductive/Obstetrics                         Anesthesia Physical Anesthesia Plan  ASA: II and Emergent  Anesthesia Plan: General, Rapid Sequence and Cricoid Pressure   Post-op Pain Management:    Induction: Rapid sequence, Cricoid pressure planned and Intravenous  Airway Management Planned: Oral ETT  Additional Equipment:   Intra-op Plan:   Post-operative Plan: Extubation in OR  Informed Consent:   Plan Discussed with: Anesthesiologist  Anesthesia Plan Comments:         Anesthesia Quick Evaluation

## 2010-12-18 NOTE — Op Note (Signed)
Patient:  Brian [name]  DOB:  02-May-1945  MRN:  [id number]   Preop Diagnosis:  Severe right lower quadrant abdominal pain  Postop Diagnosis:  Diffuse terminal ileitis/ischemia  Procedure:  Exploratory laparotomy, right hemicolectomy with side-to-side stapled ileocolonic anastomoses  Surgeon:  Dr. Tilford Pillar  Anes:  General endotracheal  Indications:  Patient is a 65 year old male who presented to [facility] emergency department with lower dominant pain nausea vomiting. His persistent course demonstrate progression of abdominal pain. He did not develop any leukocytosis or febrile episodes however due to his continued progression we did discuss surgical options. Risks benefits alternatives of exploratory laparotomy possible bowel resection possible ostomy were discussed at length the patient including but not limited to risk of bleeding, infection, anastomotic leak, bowel injury, intraoperative cardiac and pulmonary events. Patient's questions and concerns were addressed the patient was consented for the planned procedure.  Procedure note:  Patient was taken to the OR placed in the supine position on the or table which time the general anesthetic is a Optician, dispensing. Once patient was asleep he was endotracheally intubated by anesthesia. At this point a Foley catheter is placed in standard sterile fashion by the operative staff. His abdomen is prepped with DuraPrep solution and draped in standard fashion. A midline incision was created with a left heel defect around the umbilicus. Additional dissection down through subcuticular tissues carried out using a letter cautery including the division of the anterior fascia. The fascia is grasped with Coker clamps and elevated. Sharp dissection is utilized to enter into the peritoneal cavity. At this point a large amount of clear yellow-brown ascitic fluid was encountered. There is noted turbid fluid no odor no purulence. I quickly identified the terminal  ileum. This is noted to be indurated. The serosa appeared macerated. There is no clear evidence of perforation. The adjacent cecum and appendix are hyperemic and appeared to be reactive to the process affecting the terminal ileum. I opted due to its appearance to proceed with resection of this portion of the bowel. I therefore mobilized the ascending colon scoring the white line of Toltz with the electrocautery. I carried the dissection up around the hepatic flexure. An identified a portion of the transverse colon that appeared healthy and viable. I divided the transverse colon after creating a window in the mesocolon and divided the colon using a GIA 55 stapler. I then ligated the mesocolon proximally using the Enseal device. This is carried around proximally to the ascending colon. I then identified a healthy viable-appearing segment of terminal ileum, created a window in the mesentery and divided the small bowel with a reload of the GIA stapler. The mesentery, mesoappendix and remaining mesocolon were divided using the Enseal device. The right colic artery was identified and was suture ligated D. to excise and bleeding after dividing initially with the Enseal device. Hemostasis was excellent. I irrigated the field after the specimen was placed in the back table. And then brought the 2 ends the divided bowel in a side-to-side fashion. These were pexed with a 3-0 silk. The defect was created and both with electrocautery and then a reload of the GIA 55 stapler was utilized to create the stapled anastomosis in a side-to-side fashion. The remaining enterotomy was closed using interrupted 3-0 silk. The apex of the anastomosis was reinforced with additional 3-0 silk. The mesentery defect was closed with interrupted 3-0 silk. Again I irrigated the field the returning aspirate was clear. I palpated the stomach and confirmed nasogastric placement. The  liver was palpated and there is no abnormalities. The gallbladder was  distended but the appear normal. The small bowel overall was healthy appearing. There is no evidence of any mesenteric creeping fat. There were several areas of the small bowel which are hyperemic however it is suspected that these were in close approximation to the area the terminal ileum. The remainder of the abdominal exam was normal. I turned my attention at this point to closure.   The small bowel was returned back into the abdominal cavity. The omentum was replaced back over the small bowel. The fascia was reapproximated using an oh looped Novafil suture in a running continuous fashion. The skin edges were reapproximated using skin staples. Skin was washed dried moist dry towel. Sterile dressings were placed. The drapes removed the dressings were secured. The patient was allowed to come out of general anesthetic was transferred to PACU in stable condition. At the conclusion of procedure all instrument sponge and needle counts are correct. Patient tolerated procedure well.  Complications:  None apparent  EBL:  350 ML's  Specimen:  Terminal ileum, right colon, appendix

## 2010-12-18 NOTE — Progress Notes (Signed)
Subjective: Still coming complaining of abdominal pain. Had 2-3 episodes of vomiting last night Doesn't feel like eating. 2 loose BMs this morning.   Objective: Vital signs in last 24 hours: Temp:  [98.6 F (37 C)-99 F (37.2 C)] 98.8 F (37.1 C) (10/27 0658) Pulse Rate:  [94-105] 105  (10/27 0658) Resp:  [20] 20  (10/27 0658) BP: (165-205)/(90-120) 167/98 mmHg (10/27 0658) SpO2:  [96 %-98 %] 96 % (10/27 0658) Weight change:  Body mass index is 23.75 kg/(m^2).  Intake/Output from previous day: 10/26 0701 - 10/27 0700 In: 840 [I.V.:540; IV Piggyback:300] Out: -    PHYSICAL EXAM: Gen Exam: Awake and alert with clear speech.   Neck: Supple, No JVD.  Chest: B/L Clear.  CVS: S1 S2 Regular, no murmurs.  Abdomen: soft, BS sluggish. Patient has tenderness in the epigastric and right mid to lower quadrant region. Patient also has significant guarding in that region as well.  Extremities: no edema, warm.   Neurologic: Non Focal.   Skin: No Rash.   Wounds: N/A.    Lab Results:  Basename 12/18/10 0620 12/17/10 0452  WBC 8.6 5.0  HGB 11.1* 9.5*  HCT 32.4* 28.6*  PLT 178 167   CMET CMP     Component Value Date/Time   NA 132* 12/18/2010 0620   K 3.5 12/18/2010 0620   CL 102 12/18/2010 0620   CO2 19 12/18/2010 0620   GLUCOSE 104* 12/18/2010 0620   BUN 18 12/18/2010 0620   CREATININE 1.63* 12/18/2010 0620   CALCIUM 7.4* 12/18/2010 0620   PROT 6.4 12/15/2010 1119   ALBUMIN 2.4* 12/15/2010 1119   AST 12 12/15/2010 1119   ALT 5 12/15/2010 1119   ALKPHOS 61 12/15/2010 1119   BILITOT 0.9 12/15/2010 1119   GFRNONAA 43* 12/18/2010 0620   GFRAA 50* 12/18/2010 0620    Studies/Results: Ct Abdomen Pelvis W Contrast  12/13/2010  *RADIOLOGY REPORT*  Clinical Data: Upper to mid abdominal pain since yesterday.  CT ABDOMEN AND PELVIS WITH CONTRAST  Technique:  Multidetector CT imaging of the abdomen and pelvis was performed following the standard protocol during bolus  administration of intravenous contrast.  Contrast: OMNIPAQUE IOHEXOL 300 MG/ML IV SOLN  Comparison: Lumbar spine CT 07/31/2007.  Findings: There is mild scarring in both lung bases.  No significant pleural effusion is present.  There is a small to moderate amount of ascites throughout the peritoneal cavity.  The enteric contrast is just beginning to fill the proximal jejunum.  There is no extravasated enteric contrast or extraluminal air collection.  However, there are multiple loops of thickened small bowel in the periumbilical region.  There is surrounding mesenteric edema. The appendix appears normal.  The celiac trunk, superior and inferior mesenteric arteries appear widely patent.  There are scattered vascular calcifications.  The portal, splenic and superior mesenteric veins appear patent.  There is mild periportal edema within the liver.  A small ill- defined low density lesion is noted posteriorly in the right hepatic lobe on image number 11.  The spleen, gallbladder, pancreas, adrenal glands and kidneys appear normal.  The urinary bladder and prostate gland appear normal.  There are diffuse degenerative and postsurgical changes of the lumbar spine status post fusion at L5-S1.  Multilevel endplate degenerative changes are present.  No acute osseous findings are seen.  IMPRESSION:  1.  Mid small bowel wall thickening in the periumbilical region, mesenteric edema and ascites of undetermined etiology.  The findings are suspicious for focal enteritis.  Microperforation cannot be excluded, although there is no extraluminal air. 2.  No evidence of large vessel occlusion or focal extraluminal fluid collection. 3.  Mild periportal edema within the liver.  No other significant solid parenchymal organ findings.  These results were called by telephone on 12/13/2010  at  1640 hours to  Dr. Adriana Simas, who verbally acknowledged these results.  Original Report Authenticated By: Gerrianne Scale, M.D.    Medications: I  have reviewed his medications for this inpatient stay  Assessment/Plan: Patient Active Hospital Problem List:  Right-sided colitis    Assessment: This is likely ischemic or infectious colitis. -Essentially unchanged, started on a full liquid diet by gastroenterology however patient is not tolerating it with a vomiting and worsening abdominal pain.   Plan: -Change patient to n.p.o.  -continue his IV fluids.  - continue his ciprofloxacin and Flagyl.  -Await surgical followup today for consideration of laparotomy.   Abdominal pain, acute, right-sided    Assessment: This is from his ongoing colitis.   Plan: As above, will continue as needed narcotics.  Hypertension Patient does give a history of hypertension, however he is not on any medications at home. He is on IV fluids,his blood pressures are slightly better. I will use IV hydralazine as needed basis. Once his intake is resumed and stable I will start him on antihypertensive medications.  Disposition Remain an inpatient     LOS: 5 days   Maretta Bees, MD. 12/18/2010, 11:09 AM

## 2010-12-18 NOTE — Progress Notes (Signed)
The patient vomited during the night and it appeared to contain barely digested food, which he stated he age last Sunday.  He was in pain and pain meds were given and he is now asleep and more comfortable.

## 2010-12-18 NOTE — Anesthesia Procedure Notes (Addendum)
Performed by: Despina Hidden    Procedure Name: Intubation Date/Time: 12/18/2010 2:24 PM Performed by: Despina Hidden Pre-anesthesia Checklist: Patient identified, Patient being monitored, Timeout performed, Emergency Drugs available and Suction available Patient Re-evaluated:Patient Re-evaluated prior to inductionOxygen Delivery Method: Circle System Utilized Preoxygenation: Pre-oxygenation with 100% oxygen Intubation Type: IV induction, Rapid sequence and Circoid Pressure applied Ventilation: Mask ventilation without difficulty Laryngoscope Size: 3 and Mac Grade View: Grade I Tube type: Oral Tube size: 8.0 mm Number of attempts: 1 Airway Equipment and Method: stylet Placement Confirmation: breath sounds checked- equal and bilateral,  ETT inserted through vocal cords under direct vision and positive ETCO2 Secured at: 22 cm Tube secured with: Tape Dental Injury: Teeth and Oropharynx as per pre-operative assessment

## 2010-12-19 LAB — BASIC METABOLIC PANEL
Chloride: 104 mEq/L (ref 96–112)
Creatinine, Ser: 3.36 mg/dL — ABNORMAL HIGH (ref 0.50–1.35)
GFR calc Af Amer: 21 mL/min — ABNORMAL LOW (ref >90)
GFR calc non Af Amer: 18 mL/min — ABNORMAL LOW (ref >90)

## 2010-12-19 LAB — CBC
HCT: 31 % — ABNORMAL LOW (ref 39.0–52.0)
Platelets: 191 10*3/uL (ref 150–400)
RDW: 13.4 % (ref 11.5–15.5)
WBC: 9.9 10*3/uL (ref 4.0–10.5)

## 2010-12-19 MED ORDER — SODIUM CHLORIDE 0.9 % IJ SOLN: 9.0000 mL | INTRAMUSCULAR | Status: DC | PRN

## 2010-12-19 MED ORDER — HYDROMORPHONE 0.3 MG/ML IV SOLN
INTRAVENOUS | Status: AC
  Administered 2010-12-19: 13:00:00
  Filled 2010-12-19: qty 25

## 2010-12-19 MED ORDER — METRONIDAZOLE IN NACL 5-0.79 MG/ML-% IV SOLN
INTRAVENOUS | Status: AC
  Filled 2010-12-19: qty 200

## 2010-12-19 MED ORDER — HYDROMORPHONE HCL 1 MG/ML IJ SOLN: 1.0000 mg | INTRAMUSCULAR | Status: DC | PRN

## 2010-12-19 MED ORDER — ONDANSETRON HCL 4 MG/2ML IJ SOLN
4.0000 mg | Freq: Four times a day (QID) | INTRAMUSCULAR | Status: DC | PRN
  Administered 2010-12-20: 4 mg via INTRAVENOUS
  Filled 2010-12-19: qty 2

## 2010-12-19 MED ORDER — LACTATED RINGERS IV BOLUS (SEPSIS)
500.0000 mL | Freq: Once | INTRAVENOUS | Status: AC
  Administered 2010-12-19: 500 mL via INTRAVENOUS

## 2010-12-19 MED ORDER — HYDROMORPHONE 0.3 MG/ML IV SOLN
INTRAVENOUS | Status: DC
  Administered 2010-12-19: 1.5 mg via INTRAVENOUS
  Administered 2010-12-19: 13:00:00 via INTRAVENOUS
  Administered 2010-12-19: 0.9 mg via INTRAVENOUS
  Administered 2010-12-20: 0.3 mg via INTRAVENOUS
  Administered 2010-12-20: 1.2 mg via INTRAVENOUS
  Administered 2010-12-20: 1.8 mg via INTRAVENOUS
  Administered 2010-12-20: 0.6 mg via INTRAVENOUS
  Administered 2010-12-21: 1.5 mg via INTRAVENOUS
  Administered 2010-12-21 (×2): 0.6 mg via INTRAVENOUS
  Administered 2010-12-21: 0.3 mg via INTRAVENOUS

## 2010-12-19 MED ORDER — LACTATED RINGERS IV BOLUS (SEPSIS)
1000.0000 mL | Freq: Once | INTRAVENOUS | Status: AC
  Administered 2010-12-19: 1000 mL via INTRAVENOUS

## 2010-12-19 MED ORDER — DIPHENHYDRAMINE HCL 12.5 MG/5ML PO ELIX
12.5000 mg | ORAL_SOLUTION | Freq: Four times a day (QID) | ORAL | Status: DC | PRN
  Filled 2010-12-19: qty 5

## 2010-12-19 MED ORDER — NALOXONE HCL 0.4 MG/ML IJ SOLN
0.4000 mg | INTRAMUSCULAR | Status: DC | PRN
Start: 2010-12-19 — End: 2010-12-21

## 2010-12-19 MED ORDER — DIPHENHYDRAMINE HCL 50 MG/ML IJ SOLN: 12.5000 mg | Freq: Four times a day (QID) | INTRAMUSCULAR | Status: DC | PRN

## 2010-12-19 MED ORDER — LORAZEPAM 2 MG/ML IJ SOLN
1.0000 mg | Freq: Four times a day (QID) | INTRAMUSCULAR | Status: DC | PRN
  Administered 2010-12-20 – 2010-12-24 (×3): 1 mg via INTRAVENOUS
  Filled 2010-12-19 (×3): qty 1

## 2010-12-19 NOTE — Anesthesia Postprocedure Evaluation (Signed)
  Anesthesia Post-op Note  Patient: Brian [name]  Procedure(s) Performed:  EXPLORATORY LAPAROTOMY; PARTIAL COLECTOMY  Patient Location: room 318  Anesthesia Type: General  Level of Consciousness: awake, alert , oriented and patient cooperative  Airway and Oxygen Therapy: Patient Spontanous Breathing  Post-op Pain: 2 /10, mild  Post-op Assessment: Post-op Vital signs reviewed, Patient's Cardiovascular Status Stable, Respiratory Function Stable, Patent Airway, No signs of Nausea or vomiting and Pain level controlled  Post-op Vital Signs: Reviewed and stable  Complications: No apparent anesthesia complications

## 2010-12-19 NOTE — Progress Notes (Signed)
1 Day Post-Op  Subjective: Pain is poorly controlled. He states pain is improved with with administration of pain medication but is not lasting long enough. He denies any nausea or vomiting. No fevers or chills.  Objective: Vital signs in last 24 hours: Temp:  [97.5 F (36.4 C)-98.4 F (36.9 C)] 98.4 F (36.9 C) (10/28 1031) Pulse Rate:  [84-104] 102  (10/28 1031) Resp:  [12-24] 18  (10/28 1031) BP: (91-140)/(53-90) 120/85 mmHg (10/28 1031) SpO2:  [90 %-100 %] 90 % (10/28 1031) Last BM Date: 12/17/10  Intake/Output from previous day: 01/08/2023 0701 - 10/28 0700 In: 3820 [I.V.:3700; NG/GT:10; IV Piggyback:110] Out: 1135 [Urine:725; Emesis/NG output:10; Blood:350] Intake/Output this shift:    General appearance: alert and mild distress Resp: clear to auscultation bilaterally Cardio: regular rate and rhythm GI:  quiet, soft, flat, moderate to severe right-sided abdominal pain. No diffuse peritoneal signs. Dressing is clean dry and intact. Expected postoperative pain.  Lab Results:  @LABLAST2 (wbc:2,hgb:2,hct:2,plt:2) BMET  Basename 12/19/10 0446 01/08/11 0620  NA 132* 132*  K 4.4 3.5  CL 104 102  CO2 20 19  GLUCOSE 109* 104*  BUN 29* 18  CREATININE 3.36* 1.63*  CALCIUM 7.0* 7.4*   PT/INR No results found for this basename: LABPROT:2,INR:2 in the last 72 hours ABG No results found for this basename: PHART:2,PCO2:2,PO2:2,HCO3:2 in the last 72 hours  Studies/Results: Dg Abd 2 Views  01/08/2011  *RADIOLOGY REPORT*  Clinical Data: Pain and distention  ABDOMEN - 2 VIEW  Comparison:   the previous day's study  Findings: No free air on the erect film.  Visualized lung bases clear.  Gas filled nondilated small bowel loops scattered throughout the abdomen.  The colon is decompressed.  Minimal degenerative changes in the lumbar spine.  IMPRESSION: 1.  Nonobstructive bowel gas pattern.  No free air.  Original Report Authenticated By: Osa Craver, M.D.     Anti-infectives: Anti-infectives    None      Assessment/Plan: s/p Procedure(s): EXPLORATORY LAPAROTOMY PARTIAL COLECTOMY Discussed with patient surgical findings. Discussed with patient that his terminal ileum demonstrated significant inflammatory changes and while no gross perforation was noted, based on its appearance it was likely heading in this direction. I did discuss with the patient we will disclose pathology results once available. I low suspicion of a neoplastic origin. I will modify patient's pain regiment to include a PCA to see if we can obtain better pain control. Continue bowel rest for now. Continue nasogastric decompression. Continue Foley catheter for the next 24 hours to continue to monitor strict I.'s and O.'s. Urine is currently dark and I do suspect a significant amount of third spacing.  LOS: 6 days    Julee Stoll C 12/19/2010

## 2010-12-19 NOTE — Progress Notes (Signed)
Subjective:  Appreciate Dr. Aris Lot help. Operative findings reviewed. Patient is stable postoperatively  Objective: Vital signs in last 24 hours: Temp:  [97.5 F (36.4 C)-98.4 F (36.9 C)] 98.4 F (36.9 C) (10/28 1031) Pulse Rate:  [84-104] 102  (10/28 1031) Resp:  [12-24] 18  (10/28 1031) BP: (91-140)/(53-90) 120/85 mmHg (10/28 1031) SpO2:  [90 %-100 %] 90 % (10/28 1031) Last BM Date: 12/17/10   Intake/Output from previous day: 16-Jan-2023 0701 - 10/28 0700 In: 3820 [I.V.:3700; NG/GT:10; IV Piggyback:110] Out: 1135 [Urine:725; Emesis/NG output:10; Blood:350] Intake/Output this shift:    Lab Results:  Basename 12/19/10 0446 January 16, 2011 0620 12/17/10 0452  WBC 9.9 8.6 5.0  HGB 10.4* 11.1* 9.5*  HCT 31.0* 32.4* 28.6*  PLT 191 178 167   BMET  Basename 12/19/10 0446 2011/01/16 0620 12/17/10 0452  NA 132* 132* 135  K 4.4 3.5 3.2*  CL 104 102 104  CO2 20 19 21   GLUCOSE 109* 104* 81  BUN 29* 18 12  CREATININE 3.36* 1.63* 1.26  CALCIUM 7.0* 7.4* 7.7*   Studies/Results: Dg Abd 2 Views  01-16-2011  *RADIOLOGY REPORT*  Clinical Data: Pain and distention  ABDOMEN - 2 VIEW  Comparison:   the previous day's study  Findings: No free air on the erect film.  Visualized lung bases clear.  Gas filled nondilated small bowel loops scattered throughout the abdomen.  The colon is decompressed.  Minimal degenerative changes in the lumbar spine.  IMPRESSION: 1.  Nonobstructive bowel gas pattern.  No free air.  Original Report Authenticated By: Osa Craver, M.D.    Assessment: Principal Problem:  *Colitis Active Problems:  Nausea and vomiting  Abdominal pain, acute, epigastric  Leukocytosis  HTN (hypertension)  GERD (gastroesophageal reflux disease)  Status post laparotomy with resection of the terminal ileum.  Plan:      Postoperative management per Dr. Leticia Penna. We'll review pathology as it becomes available. Agree with continuing antibiotics.    LOS: 6 days   Eula Listen  12/19/2010, 11:45 AM

## 2010-12-19 NOTE — Progress Notes (Signed)
Pt's urine output from 7am to 7pm today equaled 50 cc's total. Urine appearance highly concentrated and tea colored. Foley catheter flushed with proper return Dr. Gonzella Lex paged. No return page. Dr. Leticia Penna paged. Returned page. 1 liter LR bolus ordered as well as 1mg  Ativan q 6 hours PRN. Will continue to monitor. Dagoberto Ligas, RN

## 2010-12-19 NOTE — Progress Notes (Addendum)
PT'S OUTPUT SINCE LR BOLUS AND FOLEY IRRIGATION HAS BEEN 25CC, WRITER CALLED DR. ZIEGLER TO MAKE AWARE OF PT'S OUTPUT AND BP AND HR TRENDING UPWARD.  BLADDER SCAN CANNOT BE PERFORMED DUE TO SURGICAL INCISION MIDLINE TO GROIN.  RECEIVED NEW ORDERS AND FOLLOWED.  WILL CONTINUE TO MONITOR.  URINE HIGHLY CONCENTRATED AND DARK BROWN IN COLOR.

## 2010-12-19 NOTE — Addendum Note (Signed)
Addendum  created 12/19/10 1546 by Despina Hidden   Modules edited:Notes Section

## 2010-12-19 NOTE — Progress Notes (Signed)
Subjective: Patient seen and examined this am. Informs his post surgical abdominal discomfort to be controlled on pain meds .   Objective:  Vital signs in last 24 hours:  Filed Vitals:   12/19/10 0300 12/19/10 1031 12/19/10 1249 12/19/10 1405  BP: 140/85 120/85  127/83  Pulse: 101 102  104  Temp: 97.8 F (36.6 C) 98.4 F (36.9 C)  98.3 F (36.8 C)  TempSrc: Oral     Resp: 20 18 20 18   Height:      Weight:      SpO2: 95% 90% 94% 92%    Intake/Output from previous day:   Intake/Output Summary (Last 24 hours) at 12/19/10 1639 Last data filed at 2010/12/26 1906  Gross per 24 hour  Intake   1410 ml  Output      0 ml  Net   1410 ml    Physical Exam:  General: ,middle aged male  in no acute distress.  HEENT: no pallor, no icterus, moist oral mucosa, no JVD, no lymphadenopathy , NG in plaace Heart: Normal  s1 &s2  Regular rate and rhythm, without murmurs, rubs, gallops. Lungs: Clear to auscultation bilaterally. Abdomen: dressing over laprotomy  site, no bleeding or soakage. ,  Extremities: No clubbing cyanosis or edema with positive pedal pulses. Neuro: Alert, awake, oriented x3, nonfocal.   Lab Results:  Basic Metabolic Panel:    Component Value Date/Time   NA 132* 12/19/2010 0446   K 4.4 12/19/2010 0446   CL 104 12/19/2010 0446   CO2 20 12/19/2010 0446   BUN 29* 12/19/2010 0446   CREATININE 3.36* 12/19/2010 0446   GLUCOSE 109* 12/19/2010 0446   CALCIUM 7.0* 12/19/2010 0446   CBC:    Component Value Date/Time   WBC 9.9 12/19/2010 0446   HGB 10.4* 12/19/2010 0446   HCT 31.0* 12/19/2010 0446   PLT 191 12/19/2010 0446   MCV 88.6 12/19/2010 0446   NEUTROABS 8.7* 12/13/2010 1226   LYMPHSABS 2.2 12/13/2010 1226   MONOABS 1.3* 12/13/2010 1226   EOSABS 0.1 12/13/2010 1226   BASOSABS 0.0 12/13/2010 1226    Recent Results (from the past 240 hour(s))  CULTURE, BLOOD (ROUTINE X 2)     Status: Normal (Preliminary result)   Collection Time   12/15/10  9:14 PM      Component Value Range Status Comment   Specimen Description BLOOD LEFT HAND   Final    Special Requests BOTTLES DRAWN AEROBIC AND ANAEROBIC 5CC   Final    Culture NO GROWTH 4 DAYS   Final    Report Status PENDING   Incomplete   CULTURE, BLOOD (ROUTINE X 2)     Status: Normal (Preliminary result)   Collection Time   12/15/10  9:15 PM      Component Value Range Status Comment   Specimen Description BLOOD LEFT HAND   Final    Special Requests BOTTLES DRAWN AEROBIC AND ANAEROBIC 5CC   Final    Culture NO GROWTH 4 DAYS   Final    Report Status PENDING   Incomplete   CLOSTRIDIUM DIFFICILE BY PCR     Status: Normal   Collection Time   12/17/10  5:28 PM      Component Value Range Status Comment   C difficile by pcr NEGATIVE  NEGATIVE  Final     Studies/Results: Dg Abd 2 Views  Dec 26, 2010  *RADIOLOGY REPORT*  Clinical Data: Pain and distention  ABDOMEN - 2 VIEW  Comparison:   the previous  day's study  Findings: No free air on the erect film.  Visualized lung bases clear.  Gas filled nondilated small bowel loops scattered throughout the abdomen.  The colon is decompressed.  Minimal degenerative changes in the lumbar spine.  IMPRESSION: 1.  Nonobstructive bowel gas pattern.  No free air.  Original Report Authenticated By: Osa Craver, M.D.    Medications: Scheduled Meds:   . ciprofloxacin  400 mg Intravenous Q12H  . fentaNYL      . fentaNYL      . fentaNYL      . HYDROmorphone PCA 0.3 mg/mL   Intravenous Q4H  . HYDROmorphone PCA 0.3 mg/mL      . lidocaine      . metronidazole  500 mg Intravenous Q8H  . midazolam      . neostigmine      . nicotine  14 mg Transdermal Daily  . ondansetron      . pantoprazole (PROTONIX) IV  40 mg Intravenous Q24H  . propofol      . rocuronium      . succinylcholine       Continuous Infusions:   . 0.9 % NaCl with KCl 20 mEq / L 100 mL/hr at 12/19/10 1254   PRN Meds:.acetaminophen, acetaminophen, diphenhydrAMINE, diphenhydrAMINE,  hydrALAZINE, HYDROmorphone (DILAUDID) injection, naloxone, ondansetron (ZOFRAN) IV, sodium chloride, DISCONTD: HYDROmorphone, DISCONTD: HYDROmorphone, DISCONTD: ondansetron  Assessment 65 y/o male with past medical hx of HTN and GERD presented with 2 day hx of N/V with epigastric pain with CT abd sugegstive of infectuious vs ischemic colitis and  not responding to medical / conservative treatment. Seen by GI  And surgery and had exploratory laprotomy on 10/27 with rt partial colectomy   Plan Acute ? infectious/ ischemic colitis Patient status post laprotomy with partial hemicolectomy on 10/27 which showed diffusely inflammed terminal ileum. Pathology pending -C/o poorly controlled pain. Started on dilaudid PCA by surgery and will continue Cont NG tube  cont foley  follow up surgical pathology results Cont cipro and flagyl  cont PPI Cont NG and foley Cont NPO    LOS: 6 days   Brian [name] 12/19/2010, 4:39 PM

## 2010-12-20 ENCOUNTER — Inpatient Hospital Stay (HOSPITAL_COMMUNITY): Payer: Medicare Other

## 2010-12-20 DIAGNOSIS — K5289 Other specified noninfective gastroenteritis and colitis: Secondary | ICD-10-CM

## 2010-12-20 DIAGNOSIS — Z9889 Other specified postprocedural states: Secondary | ICD-10-CM

## 2010-12-20 LAB — BLOOD GAS, ARTERIAL
O2 Content: 2 L/min
pCO2 arterial: 29.4 mmHg — ABNORMAL LOW (ref 35.0–45.0)
pO2, Arterial: 66.7 mmHg — ABNORMAL LOW (ref 80.0–100.0)

## 2010-12-20 LAB — COMPREHENSIVE METABOLIC PANEL
ALT: 25 U/L (ref 0–53)
AST: 42 U/L — ABNORMAL HIGH (ref 0–37)
Albumin: 1.6 g/dL — ABNORMAL LOW (ref 3.5–5.2)
Alkaline Phosphatase: 47 U/L (ref 39–117)
BUN: 39 mg/dL — ABNORMAL HIGH (ref 6–23)
Chloride: 103 mEq/L (ref 96–112)
Potassium: 4.2 mEq/L (ref 3.5–5.1)
Total Bilirubin: 0.9 mg/dL (ref 0.3–1.2)

## 2010-12-20 LAB — CULTURE, BLOOD (ROUTINE X 2): Culture: NO GROWTH

## 2010-12-20 LAB — URINALYSIS, ROUTINE W REFLEX MICROSCOPIC
Glucose, UA: NEGATIVE mg/dL
Protein, ur: >300 mg/dL — AB

## 2010-12-20 LAB — CBC
Hemoglobin: 9 g/dL — ABNORMAL LOW (ref 13.0–17.0)
MCHC: 33.9 g/dL (ref 30.0–36.0)
MCHC: 34 g/dL (ref 30.0–36.0)
RBC: 3.02 MIL/uL — ABNORMAL LOW (ref 4.22–5.81)
RDW: 13.6 % (ref 11.5–15.5)

## 2010-12-20 LAB — BASIC METABOLIC PANEL
GFR calc Af Amer: 12 mL/min — ABNORMAL LOW (ref >90)
GFR calc non Af Amer: 10 mL/min — ABNORMAL LOW (ref >90)
Potassium: 4.6 mEq/L (ref 3.5–5.1)
Sodium: 132 mEq/L — ABNORMAL LOW (ref 135–145)

## 2010-12-20 LAB — DIFFERENTIAL
Basophils Relative: 0 % (ref 0–1)
Lymphs Abs: 1 10*3/uL (ref 0.7–4.0)
Monocytes Relative: 13 % — ABNORMAL HIGH (ref 3–12)
Neutro Abs: 8.3 10*3/uL — ABNORMAL HIGH (ref 1.7–7.7)
Neutrophils Relative %: 78 % — ABNORMAL HIGH (ref 43–77)

## 2010-12-20 LAB — CK: Total CK: 180 U/L (ref 7–232)

## 2010-12-20 MED ORDER — SODIUM CHLORIDE 0.9 % IV BOLUS (SEPSIS)
1000.0000 mL | Freq: Once | INTRAVENOUS | Status: AC
  Administered 2010-12-20: 1000 mL via INTRAVENOUS

## 2010-12-20 MED ORDER — METOPROLOL TARTRATE 1 MG/ML IV SOLN
5.0000 mg | Freq: Four times a day (QID) | INTRAVENOUS | Status: DC
  Administered 2010-12-20 – 2010-12-22 (×8): 5 mg via INTRAVENOUS
  Filled 2010-12-20 (×8): qty 5

## 2010-12-20 MED ORDER — FUROSEMIDE 10 MG/ML IJ SOLN
40.0000 mg | Freq: Two times a day (BID) | INTRAMUSCULAR | Status: DC
  Administered 2010-12-20: 40 mg via INTRAVENOUS
  Filled 2010-12-20: qty 4

## 2010-12-20 MED ORDER — HYDROMORPHONE 0.3 MG/ML IV SOLN
INTRAVENOUS | Status: AC
  Administered 2010-12-20: 13:00:00
  Filled 2010-12-20: qty 25

## 2010-12-20 MED ORDER — SODIUM CHLORIDE 0.9 % IJ SOLN
INTRAMUSCULAR | Status: AC
  Filled 2010-12-20: qty 10

## 2010-12-20 MED ORDER — ALBUMIN HUMAN 25 % IV SOLN
25.0000 g | Freq: Two times a day (BID) | INTRAVENOUS | Status: DC
  Filled 2010-12-20 (×2): qty 100

## 2010-12-20 MED ORDER — LEVOFLOXACIN IN D5W 500 MG/100ML IV SOLN
500.0000 mg | Freq: Once | INTRAVENOUS | Status: AC
  Administered 2010-12-20: 500 mg via INTRAVENOUS
  Filled 2010-12-20: qty 100

## 2010-12-20 MED ORDER — LEVOFLOXACIN IN D5W 250 MG/50ML IV SOLN
250.0000 mg | INTRAVENOUS | Status: DC
  Administered 2010-12-21: 250 mg via INTRAVENOUS
  Filled 2010-12-20: qty 50

## 2010-12-20 MED ORDER — SODIUM CHLORIDE 0.9 % IV SOLN
INTRAVENOUS | Status: DC
  Administered 2010-12-20: 09:00:00 via INTRAVENOUS
  Administered 2010-12-21: 75 mL via INTRAVENOUS
  Administered 2010-12-21: 01:00:00 via INTRAVENOUS

## 2010-12-20 MED ORDER — FUROSEMIDE 10 MG/ML IJ SOLN
40.0000 mg | Freq: Once | INTRAMUSCULAR | Status: AC
  Administered 2010-12-20: 40 mg via INTRAVENOUS
  Filled 2010-12-20: qty 4

## 2010-12-20 MED ORDER — FUROSEMIDE 10 MG/ML IJ SOLN
200.0000 mg | Freq: Two times a day (BID) | INTRAVENOUS | Status: DC
  Administered 2010-12-20 – 2010-12-23 (×6): 200 mg via INTRAVENOUS
  Filled 2010-12-20 (×6): qty 20

## 2010-12-20 MED ORDER — CIPROFLOXACIN IN D5W 400 MG/200ML IV SOLN
400.0000 mg | INTRAVENOUS | Status: DC
  Filled 2010-12-20: qty 200

## 2010-12-20 MED ORDER — ALBUMIN HUMAN 25 % IV SOLN
25.0000 g | Freq: Two times a day (BID) | INTRAVENOUS | Status: DC
  Administered 2010-12-20 – 2010-12-21 (×3): 25 g via INTRAVENOUS
  Filled 2010-12-20 (×4): qty 100

## 2010-12-20 NOTE — Progress Notes (Signed)
WRITER REASSESSED PT'S VS AND OUTPUT. PT STATES THAT HE IS FILLING UP WITH FLUIDS AND FEELS SOMETHING IS WRONG.  BP WAS 167/100 AND RESPIRATIONS WERE 32 WITH A LOW GRADE TEMP.  OUTPUT WAS 0CC SINCE LAST BOLUS AND INCREASE IN IVF AT 2100.  PT'S ABDOMEN IS TIGHT AND VERY TENDER TO TOUCH AS WELL.  WRITER CALLED DR. ZIEGLER TO MAKE AWARE, RECEIVED NEW ORDERS AND FOLLOWED.

## 2010-12-20 NOTE — Progress Notes (Signed)
PT TRANSFERRED TO ICU06. Pain better. ABD: BS present, mild TTP & distenstion. SUPPORTIVE CARE. AWAIT Bx.
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
## 2010-12-20 NOTE — Progress Notes (Addendum)
WRITER RECEIVED ALL LABS AND CXR RESULTS.  ALL CALLED INTO DR. ZIEGLER.  RECOMMENDED TO KVO IVF AND TO DO AN ABDOMINAL XRAY.  RECEIVED NEW ORDERS AND FOLLOWED.  WILL CONTINUE TO MONITOR.  MD WANTS TO LEAVE FOLEY CATH IN TO MONITOR STRICT I&O'S.

## 2010-12-21 ENCOUNTER — Inpatient Hospital Stay (HOSPITAL_COMMUNITY): Payer: Medicare Other

## 2010-12-21 LAB — BASIC METABOLIC PANEL
GFR calc Af Amer: 9 mL/min — ABNORMAL LOW (ref >90)
GFR calc non Af Amer: 8 mL/min — ABNORMAL LOW (ref >90)
Potassium: 5 mEq/L (ref 3.5–5.1)
Sodium: 135 mEq/L (ref 135–145)

## 2010-12-21 LAB — URINALYSIS, ROUTINE W REFLEX MICROSCOPIC
Glucose, UA: NEGATIVE mg/dL
Glucose, UA: NEGATIVE mg/dL
Protein, ur: >300 mg/dL — AB
Protein, ur: >300 mg/dL — AB
pH: 6.5 (ref 5.0–8.0)

## 2010-12-21 LAB — URINE MICROSCOPIC-ADD ON

## 2010-12-21 LAB — CBC
Hemoglobin: 9.9 g/dL — ABNORMAL LOW (ref 13.0–17.0)
RBC: 3.3 MIL/uL — ABNORMAL LOW (ref 4.22–5.81)

## 2010-12-21 LAB — HEPATITIS B SURFACE ANTIGEN: Hepatitis B Surface Ag: NEGATIVE

## 2010-12-21 MED ORDER — TUBERCULIN PPD 5 UNIT/0.1ML ID SOLN
5.0000 [IU] | Freq: Once | INTRADERMAL | Status: AC
  Administered 2010-12-21: 5 [IU] via INTRADERMAL
  Filled 2010-12-21: qty 0.1

## 2010-12-21 MED ORDER — BIOTENE DRY MOUTH MT LIQD
OROMUCOSAL | Status: DC
  Administered 2010-12-21: 20:00:00 via OROMUCOSAL
  Administered 2010-12-21 (×2): 1 via OROMUCOSAL
  Administered 2010-12-22 (×5): via OROMUCOSAL
  Administered 2010-12-22: 15 mL via OROMUCOSAL
  Administered 2010-12-23 (×3): via OROMUCOSAL
  Administered 2010-12-23: 15 mL via OROMUCOSAL
  Administered 2010-12-23 – 2010-12-24 (×2): via OROMUCOSAL

## 2010-12-21 MED ORDER — HYDROMORPHONE HCL 1 MG/ML IJ SOLN
1.0000 mg | INTRAMUSCULAR | Status: DC | PRN
  Administered 2010-12-21 – 2010-12-24 (×9): 1 mg via INTRAVENOUS
  Filled 2010-12-21 (×10): qty 1

## 2010-12-21 MED ORDER — HEPARIN SODIUM (PORCINE) 5000 UNIT/ML IJ SOLN
5000.0000 [IU] | Freq: Three times a day (TID) | INTRAMUSCULAR | Status: DC
  Administered 2010-12-21 – 2010-12-24 (×11): 5000 [IU] via SUBCUTANEOUS
  Filled 2010-12-21 (×11): qty 1

## 2010-12-21 MED ORDER — HYDRALAZINE HCL 20 MG/ML IJ SOLN
20.0000 mg | INTRAMUSCULAR | Status: DC | PRN
  Administered 2010-12-22: 10 mg via INTRAVENOUS
  Administered 2010-12-23: 20 mg via INTRAVENOUS
  Filled 2010-12-21: qty 1

## 2010-12-21 NOTE — Progress Notes (Signed)
PT CONFUSED BUT CAN COMMUNICATE. STATES PAIN MEDS HELP HIS PAIN. RN REPORTS PT NOT PASSING GAS OR HAVING A BM. GETTING HD TODAY.   REVIEWED.

## 2010-12-21 NOTE — Plan of Care (Signed)
Patient is new to hemodialysis, no outpatient plan of care

## 2010-12-21 NOTE — Procedures (Signed)
Central Venous Catheter Insertion Procedure Note Brian [name] [id number] 1945-05-22  Procedure: Insertion of Central Venous Catheter Indications: Hemodialysis  Procedure Details Consent: Risks of procedure as well as the alternatives and risks of each were explained to the (patient/caregiver).  Consent for procedure obtained. Time Out: Verified patient identification, verified procedure, site/side was marked, verified correct patient position, special equipment/implants available, medications/allergies/relevent history reviewed, required imaging and test results available.  Performed  Maximum sterile technique was used including antiseptics, cap, gloves, gown, hand hygiene, mask and sheet. Skin prep: Chlorhexidine; local anesthetic administered A antimicrobial bonded/coated double lumen catheter was placed in the right femoral vein due to patient being a dialysis patient using the Seldinger technique.  Evaluation Blood flow good Complications: No apparent complications Patient did tolerate procedure well. Chest X-ray ordered to verify placement.  CXR: N/A.  Brian [name] C 12/21/2010, 10:50 PM

## 2010-12-21 NOTE — Progress Notes (Signed)
ANTIBIOTIC CONSULT NOTE - INITIAL  Pharmacy Consult for Levquin Indication: pneumonia  No Known Allergies  Patient Measurements: Height: 5\' 7"  (170.2 cm) Weight: 177 lb 7.5 oz (80.5 kg) IBW/kg (Calculated) : 66.1   Vital Signs: Temp: 97.8 F (36.6 C) (10/30 0400) Temp src: Oral (10/30 0400) BP: 155/136 mmHg (10/30 0600) Pulse Rate: 90  (10/30 0600) Intake/Output from previous day: 10/29 0701 - 10/30 0700 In: 2508.2 [I.V.:1958.2; IV Piggyback:550] Out: 615 [Urine:215; Emesis/NG output:400] Intake/Output from this shift: Total I/O In: 150 [IV Piggyback:150] Out: 25 [Urine:25]  Labs:  [facility] 12/21/10 0507 12/20/10 0455 12/20/10 0019  WBC 13.5* 11.9* 10.8*  HGB 9.9* 9.4* 9.0*  PLT 297 232 203  LABCREA -- -- --  CREATININE 6.79* 5.38* 4.99*   Estimated Creatinine Clearance: 11.3 ml/min (by C-G formula based on Cr of 6.79). No results found for this basename: VANCOTROUGH:2,VANCOPEAK:2,VANCORANDOM:2,GENTTROUGH:2,GENTPEAK:2,GENTRANDOM:2,TOBRATROUGH:2,TOBRAPEAK:2,TOBRARND:2,AMIKACINPEAK:2,AMIKACINTROU:2,AMIKACIN:2, in the last 72 hours   Microbiology: Recent Results (from the past 720 hour(s))  CULTURE, BLOOD (ROUTINE X 2)     Status: Normal   Collection Time   12/15/10  9:14 PM      Component Value Range Status Comment   Specimen Description BLOOD LEFT HAND   Final    Special Requests BOTTLES DRAWN AEROBIC AND ANAEROBIC 5CC   Final    Culture NO GROWTH 5 DAYS   Final    Report Status 12/20/2010 FINAL   Final   CULTURE, BLOOD (ROUTINE X 2)     Status: Normal   Collection Time   12/15/10  9:15 PM      Component Value Range Status Comment   Specimen Description BLOOD LEFT HAND   Final    Special Requests BOTTLES DRAWN AEROBIC AND ANAEROBIC 5CC   Final    Culture NO GROWTH 5 DAYS   Final    Report Status 12/20/2010 FINAL   Final   STOOL CULTURE     Status: Normal   Collection Time   12/17/10  5:28 PM      Component Value Range Status Comment   Specimen Description  STOOL   Final    Special Requests Normal   Final    Culture     Final    Value: NO SALMONELLA, SHIGELLA, CAMPYLOBACTER, OR YERSINIA ISOLATED   Report Status 12/21/2010 FINAL   Final   CLOSTRIDIUM DIFFICILE BY PCR     Status: Normal   Collection Time   12/17/10  5:28 PM      Component Value Range Status Comment   C difficile by pcr NEGATIVE  NEGATIVE  Final   MRSA PCR SCREENING     Status: Normal   Collection Time   12/20/10 11:42 AM      Component Value Range Status Comment   MRSA by PCR NEGATIVE  NEGATIVE  Final     Medical History: Past Medical History  Diagnosis Date  . Hypertension   . CVA (cerebral infarction)     Medications:  Scheduled:    . albumin human  25 g Intravenous BID  . furosemide  200 mg Intravenous BID  . heparin subcutaneous  5,000 Units Subcutaneous Q8H  . HYDROmorphone PCA 0.3 mg/mL   Intravenous Q4H  . HYDROmorphone PCA 0.3 mg/mL      . levofloxacin (LEVAQUIN) IV  250 mg Intravenous Q24H  . levofloxacin (LEVAQUIN) IV  500 mg Intravenous Once  . metoprolol  5 mg Intravenous Q6H  . metronidazole  500 mg Intravenous Q8H  . nicotine  14 mg Transdermal  Daily  . pantoprazole (PROTONIX) IV  40 mg Intravenous Q24H  . sodium chloride  1,000 mL Intravenous Once  . tuberculin  5 Units Intradermal Once  . DISCONTD: albumin human  25 g Intravenous BID  . DISCONTD: ciprofloxacin  400 mg Intravenous Q24H  . DISCONTD: furosemide  40 mg Intravenous BID  . DISCONTD: sodium chloride       Assessment: Okay for Protocol Adjusted for Renal Function  Goal of Therapy:  Eradicate infection.  Plan:  Levaquin started 10/29 500mg  IV x 1 and then 250mg  IV every 24 hours. F/U Micro data.  Lamonte Richer R 12/21/2010,10:36 AM

## 2010-12-21 NOTE — Progress Notes (Signed)
Pt pulled out NG tube at 0700am saying he couldn't breath with it in.
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
## 2010-12-21 NOTE — Progress Notes (Signed)
Subjective: Pt sleepy.  Moans in pain w/ exam.  WBC 13.5.  Creatinine 6.79.  Objective: Vital signs in last 24 hours: Temp:  [97.7 F (36.5 C)-97.9 F (36.6 C)] 97.8 F (36.6 C) (10/30 0400) Pulse Rate:  [90-106] 90  (10/30 0600) Resp:  [14-32] 21  (10/30 0600) BP: (125-180)/(79-136) 155/136 mmHg (10/30 0600) SpO2:  [89 %-100 %] 96 % (10/30 0600) Weight:  [177 lb 7.5 oz (80.5 kg)-190 lb 4.1 oz (86.3 kg)] 177 lb 7.5 oz (80.5 kg) (10/30 0500) Last BM Date: 12/17/10 General: Arouses easily, but more lethargic today. Head: Normocephalic and atraumatic.  Eyes: Sclera clear, no icterus. Conjunctiva pink.  Mouth: No deformity or lesions, OP pink/moistl.  Neck: Supple; no masses or thyromegaly.  Heart: Regular rate and rhythm; no murmurs, clicks, rubs, or gallops.  Abdomen: Firm w/ staples intact, distended very tender to palpation. + Guarding. Faint BS.  Pulses: Normal pulses noted.  Extremities: Trace bilat PT edema.  Skin: Warm & dry  Lab Results:  Basename 12/21/10 0507 12/20/10 0455 12/20/10 0019  WBC 13.5* 11.9* 10.8*  HGB 9.9* 9.4* 9.0*  HCT 28.7* 27.7* 26.5*  PLT 297 232 203   BMET  Basename 12/21/10 0507 12/20/10 0455 12/20/10 0019  NA 135 132* 130*  K 5.0 4.6 4.2  CL 108 103 103  CO2 16* 18* 18*  GLUCOSE 105* 93 98  BUN 61* 43* 39*  CREATININE 6.79* 5.38* 4.99*  CALCIUM 7.6* 7.6* 7.4*   LFT  Basename 12/21/10 0803 12/20/10 0019  PROT -- 5.0*  ALBUMIN -- 1.6*  AST -- 42*  ALT 11 --  ALKPHOS -- 47  BILITOT -- 0.9  BILIDIR -- --  IBILI -- --   Assessment: Colitis s/p EXPLORATORY LAPAROTOMY & PARTIAL COLECTOMY by Dr Caesar Bookman: Pt condition guarded. Pt quite tender on abd exam.  Path pending. ? Acute vasculitis vs. Post op complication. ARF w/ Creatinine elevate/Decreased urinary output: Per hospitalist.  Pt to receive dialysis. GERD (gastroesophageal reflux disease): Stable  Plan: FU on path Pt for central line & dialysis Supportive measures Consider  non-contrast abdominal imaging  Addendum: Discussed w/ Dr Darrick Penna.  She will see pt.  Called PATHOLOGY-put a rush on biopsy.   LOS: 8 days   Lorenza Burton  12/21/2010, 10:16 AM

## 2010-12-22 ENCOUNTER — Encounter (HOSPITAL_COMMUNITY): Payer: Self-pay | Admitting: General Surgery

## 2010-12-22 ENCOUNTER — Inpatient Hospital Stay (HOSPITAL_COMMUNITY): Payer: Medicare Other

## 2010-12-22 LAB — ANA: Anti Nuclear Antibody(ANA): POSITIVE — AB

## 2010-12-22 LAB — CBC
MCH: 29.2 pg (ref 26.0–34.0)
MCHC: 34.1 g/dL (ref 30.0–36.0)
MCV: 85.6 fL (ref 78.0–100.0)
Platelets: 288 10*3/uL (ref 150–400)
RBC: 3.19 MIL/uL — ABNORMAL LOW (ref 4.22–5.81)
RDW: 13.5 % (ref 11.5–15.5)

## 2010-12-22 LAB — HEPATITIS B CORE ANTIBODY, IGM: Hep B C IgM: NEGATIVE

## 2010-12-22 LAB — ANTI-NUCLEAR AB-TITER (ANA TITER): ANA Titer 1: NEGATIVE

## 2010-12-22 LAB — BASIC METABOLIC PANEL
CO2: 16 mEq/L — ABNORMAL LOW (ref 19–32)
Calcium: 7.4 mg/dL — ABNORMAL LOW (ref 8.4–10.5)
Creatinine, Ser: 6.8 mg/dL — ABNORMAL HIGH (ref 0.50–1.35)
GFR calc non Af Amer: 8 mL/min — ABNORMAL LOW (ref >90)

## 2010-12-22 LAB — PHOSPHORUS: Phosphorus: 5.9 mg/dL — ABNORMAL HIGH (ref 2.3–4.6)

## 2010-12-22 LAB — C4 COMPLEMENT: Complement C4, Body Fluid: 20 mg/dL (ref 10–40)

## 2010-12-22 MED ORDER — VANCOMYCIN HCL IN DEXTROSE 1-5 GM/200ML-% IV SOLN
1000.0000 mg | INTRAVENOUS | Status: DC
  Filled 2010-12-22: qty 200

## 2010-12-22 MED ORDER — SODIUM CHLORIDE 0.9 % IV SOLN
INTRAVENOUS | Status: DC
  Administered 2010-12-22: 21:00:00 via INTRAVENOUS
  Administered 2010-12-23: 75 mL via INTRAVENOUS
  Administered 2010-12-23 – 2010-12-24 (×2): via INTRAVENOUS

## 2010-12-22 MED ORDER — HYDRALAZINE HCL 20 MG/ML IJ SOLN
10.0000 mg | Freq: Once | INTRAMUSCULAR | Status: DC
  Filled 2010-12-22: qty 1

## 2010-12-22 MED ORDER — VANCOMYCIN HCL IN DEXTROSE 1-5 GM/200ML-% IV SOLN
1000.0000 mg | INTRAVENOUS | Status: DC
  Administered 2010-12-24: 1000 mg via INTRAVENOUS
  Filled 2010-12-22 (×2): qty 200

## 2010-12-22 MED ORDER — SODIUM CHLORIDE 0.9 % IV SOLN
250.0000 mg | Freq: Two times a day (BID) | INTRAVENOUS | Status: DC
  Administered 2010-12-22 – 2010-12-24 (×5): 250 mg via INTRAVENOUS
  Filled 2010-12-22 (×11): qty 250

## 2010-12-22 MED ORDER — IOHEXOL 300 MG/ML  SOLN
100.0000 mL | Freq: Once | INTRAMUSCULAR | Status: AC | PRN
  Administered 2010-12-22: 100 mL via INTRAVENOUS

## 2010-12-22 MED ORDER — M.V.I. ADULT IV INJ: INTRAVENOUS | Status: DC

## 2010-12-22 MED ORDER — FAT EMULSION 20 % IV EMUL
250.0000 mL | INTRAVENOUS | Status: DC
  Filled 2010-12-22 (×2): qty 250

## 2010-12-22 MED ORDER — TRACE MINERALS CR-CU-MN-SE-ZN 10-1000-500-60 MCG/ML IV SOLN
INTRAVENOUS | Status: DC
  Filled 2010-12-22 (×2): qty 2000

## 2010-12-22 MED ORDER — VANCOMYCIN HCL 1000 MG IV SOLR
1250.0000 mg | Freq: Once | INTRAVENOUS | Status: AC
  Administered 2010-12-22: 1250 mg via INTRAVENOUS
  Filled 2010-12-22: qty 1250

## 2010-12-22 NOTE — Progress Notes (Signed)
ANTIBIOTIC CONSULT NOTE - initial  Pharmacy Consult for vancomycin and primaxin added Indication: pneumonia  No Known Allergies  Patient Measurements: Height: 5\' 7"  (170.2 cm) Weight: 180 lb 8.9 oz (81.9 kg) IBW/kg (Calculated) : 66.1   Vital Signs: Temp: 97.6 F (36.4 C) (10/31 1145) Temp src: Oral (10/31 1145) BP: 131/90 mmHg (10/31 1205) Pulse Rate: 109  (10/31 1150) Intake/Output from previous day: 10/30 0701 - 10/31 0700 In: 1426.3 [I.V.:766.3; IV Piggyback:660] Out: 1325 [Urine:175; Emesis/NG output:250] Intake/Output from this shift: Total I/O In: 50 [IV Piggyback:50] Out: 25 [Urine:25]  Labs:  [facility] 12/22/10 0352 12/21/10 0507 12/20/10 0455  WBC 16.3* 13.5* 11.9*  HGB 9.3* 9.9* 9.4*  PLT 288 297 232  LABCREA -- -- --  CREATININE 6.80* 6.79* 5.38*   Estimated Creatinine Clearance: 11.4 ml/min (by C-G formula based on Cr of 6.8). No results found for this basename: VANCOTROUGH:2,VANCOPEAK:2,VANCORANDOM:2,GENTTROUGH:2,GENTPEAK:2,GENTRANDOM:2,TOBRATROUGH:2,TOBRAPEAK:2,TOBRARND:2,AMIKACINPEAK:2,AMIKACINTROU:2,AMIKACIN:2, in the last 72 hours   Microbiology: Recent Results (from the past 720 hour(s))  CULTURE, BLOOD (ROUTINE X 2)     Status: Normal   Collection Time   12/15/10  9:14 PM      Component Value Range Status Comment   Specimen Description BLOOD LEFT HAND   Final    Special Requests BOTTLES DRAWN AEROBIC AND ANAEROBIC 5CC   Final    Culture NO GROWTH 5 DAYS   Final    Report Status 12/20/2010 FINAL   Final   CULTURE, BLOOD (ROUTINE X 2)     Status: Normal   Collection Time   12/15/10  9:15 PM      Component Value Range Status Comment   Specimen Description BLOOD LEFT HAND   Final    Special Requests BOTTLES DRAWN AEROBIC AND ANAEROBIC 5CC   Final    Culture NO GROWTH 5 DAYS   Final    Report Status 12/20/2010 FINAL   Final   STOOL CULTURE     Status: Normal   Collection Time   12/17/10  5:28 PM      Component Value Range Status Comment   Specimen Description STOOL   Final    Special Requests Normal   Final    Culture     Final    Value: NO SALMONELLA, SHIGELLA, CAMPYLOBACTER, OR YERSINIA ISOLATED   Report Status 12/21/2010 FINAL   Final   CLOSTRIDIUM DIFFICILE BY PCR     Status: Normal   Collection Time   12/17/10  5:28 PM      Component Value Range Status Comment   C difficile by pcr NEGATIVE  NEGATIVE  Final   MRSA PCR SCREENING     Status: Normal   Collection Time   12/20/10 11:42 AM      Component Value Range Status Comment   MRSA by PCR NEGATIVE  NEGATIVE  Final     Medical History: Past Medical History  Diagnosis Date  . Hypertension   . CVA (cerebral infarction)     Medications:  Scheduled:     . antiseptic oral rinse   Mouth Rinse Q4H  . furosemide  200 mg Intravenous BID  . heparin subcutaneous  5,000 Units Subcutaneous Q8H  . hydrALAZINE  10 mg Intravenous Once  . imipenem-cilastatin  250 mg Intravenous Q12H  . metronidazole  500 mg Intravenous Q8H  . nicotine  14 mg Transdermal Daily  . pantoprazole (PROTONIX) IV  40 mg Intravenous Q24H  . vancomycin  1,250 mg Intravenous Once  . vancomycin  1,000 mg Intravenous Q M,W,F-HD  .  DISCONTD: albumin human  25 g Intravenous BID  . DISCONTD: HYDROmorphone PCA 0.3 mg/mL   Intravenous Q4H  . DISCONTD: levofloxacin (LEVAQUIN) IV  250 mg Intravenous Q24H  . DISCONTD: metoprolol  5 mg Intravenous Q6H  . DISCONTD: vancomycin  1,000 mg Intravenous Q24H   Assessment: ESRD requiring dialysis Adjusted for Renal Function  Goal of Therapy:  Eradicate infection.  Plan: Vancomycin 1250mg  iv today x 1 after dialysis then 1000mg  iv after each dialysis Primaxin 250mg  iv q12hrs. F/U Micro data.  Valrie Hart A 12/22/2010,12:12 PM

## 2010-12-22 NOTE — Progress Notes (Signed)
UR Chart Review Completed  

## 2010-12-22 NOTE — Progress Notes (Signed)
Pt. Remains very anxious and states he is worried about his kidneys failing. Had conversation with patient about the common risk factors for ESRD versus acute onset related to trauma such as surgery. Pt. Stated he understands that kidney function can return, but is not assured. Pt. Understands education given related to long term dialysis being possible, and that while large lifestyle changes may occur, it is not a life ending scenario. Pt. Seems calmer, but remains somewhat anxious about the uncertain outcome.

## 2010-12-22 NOTE — Progress Notes (Signed)
Subjective: C/o abdominal pain. No other specific cmplaints.   Objective: Patient Vitals for the past 24 hrs:  BP Temp Temp src Pulse Resp SpO2 Weight  12/22/10 0800 127/104 mmHg 97.5 F (36.4 C) Oral 102  20  95 % -  12/22/10 0726 122/93 mmHg - - 103  20  95 % -  12/22/10 0700 122/93 mmHg - - 101  20  95 % -  12/22/10 0600 132/91 mmHg - - 96  23  96 % -  12/22/10 0500 121/78 mmHg - - 110  24  93 % -  12/22/10 0400 94/59 mmHg - - 97  32  92 % -  12/22/10 0300 132/91 mmHg - - 109  29  94 % -  12/22/10 0200 138/111 mmHg - - 104  32  95 % -  12/22/10 0100 167/112 mmHg - - 106  27  93 % -  12/22/10 0025 - - - - - 99 % -  12/22/10 0000 152/109 mmHg - - 95  25  96 % -  12/21/10 2300 158/110 mmHg - - 109  23  94 % -  12/21/10 2200 151/105 mmHg - - 105  21  100 % -  12/21/10 2115 - 97.9 F (36.6 C) Oral - - - 80.8 kg (178 lb 2.1 oz)  12/21/10 2107 - - - - - 98 % -  12/21/10 2100 106/89 mmHg - - 103  28  98 % -  12/21/10 2000 134/99 mmHg 97.8 F (36.6 C) Axillary 106  27  91 % -  12/21/10 1915 134/110 mmHg - - - - - -  12/21/10 1900 159/111 mmHg 97.6 F (36.4 C) Oral 95  18  94 % 81.6 kg (179 lb 14.3 oz)  12/21/10 1840 - - - 93  17  96 % -  12/21/10 1806 - - - 91  32  94 % -  12/21/10 1800 165/123 mmHg - - 102  34  95 % -  12/21/10 1700 153/102 mmHg - - 105  32  100 % -  12/21/10 1600 134/103 mmHg 97.3 F (36.3 C) Oral 104  25  94 % -  12/21/10 1500 165/114 mmHg - - - 30  - -  12/21/10 1440 175/110 mmHg - - 101  29  93 % -  12/21/10 1400 175/110 mmHg - - 99  29  94 % -  12/21/10 1300 160/115 mmHg - - 94  29  94 % -  12/21/10 1200 171/118 mmHg 97.5 F (36.4 C) Oral - 26  - -  12/21/10 1100 154/117 mmHg - - - 26  - -   Weight change: -4.7 kg (-10 lb 5.8 oz)  Intake/Output Summary (Last 24 hours) at 12/22/10 1028 Last data filed at 12/22/10 0700  Gross per 24 hour  Intake 851.25 ml  Output   1250 ml  Net -398.75 ml    Physical Exam: General appearance: alert, cooperative and  no distress Head: Normocephalic, without obvious abnormality, atraumatic, sinuses nontender to percussion Throat: lips, mucosa, and tongue normal; teeth and gums normal Lungs: diminished breath sounds  Heart: regular rate and rhythm, S1, S2 normal, no murmur, click, rub or gallop Abdomen: abnormal findings:  absent bowel sounds, distended, hypoactive bowel sounds and moderate tenderness difuse Extremities: extremities normal, atraumatic, no cyanosis or edema  Lab Results:  [facility] 12/22/10 0352 12/21/10 0507  NA 137 135  K 4.7 5.0  CL 107 108  CO2 16* 16*  GLUCOSE  107* 105*  BUN 66* 61*  CREATININE 6.80* 6.79*  CALCIUM 7.4* 7.6*  MG -- --  PHOS 5.9* --    Basename 12/21/10 0803 12/20/10 0019  AST -- 42*  ALT 11 25  ALKPHOS -- 47  BILITOT -- 0.9  PROT -- 5.0*  ALBUMIN -- 1.6*   No results found for this basename: LIPASE:2,AMYLASE:2 in the last 72 hours  Basename 12/22/10 0352 12/21/10 0507 12/20/10 0019  WBC 16.3* 13.5* --  NEUTROABS -- -- 8.3*  HGB 9.3* 9.9* --  HCT 27.3* 28.7* --  MCV 85.6 87.0 --  PLT 288 297 --    Basename 12/20/10 1745  CKTOTAL 180  CKMB --  CKMBINDEX --  TROPONINI --   No results found for this basename: POCBNP:3 in the last 72 hours No results found for this basename: DDIMER:2 in the last 72 hours No results found for this basename: HGBA1C:2 in the last 72 hours No results found for this basename: CHOL:2,HDL:2,LDLCALC:2,TRIG:2,CHOLHDL:2,LDLDIRECT:2 in the last 72 hours No results found for this basename: TSH,T4TOTAL,FREET3,T3FREE,THYROIDAB in the last 72 hours No results found for this basename: VITAMINB12:2,FOLATE:2,FERRITIN:2,TIBC:2,IRON:2,RETICCTPCT:2 in the last 72 hours  Micro Results: Recent Results (from the past 240 hour(s))  CULTURE, BLOOD (ROUTINE X 2)     Status: Normal   Collection Time   12/15/10  9:14 PM      Component Value Range Status Comment   Specimen Description BLOOD LEFT HAND   Final    Special Requests  BOTTLES DRAWN AEROBIC AND ANAEROBIC 5CC   Final    Culture NO GROWTH 5 DAYS   Final    Report Status 12/20/2010 FINAL   Final   CULTURE, BLOOD (ROUTINE X 2)     Status: Normal   Collection Time   12/15/10  9:15 PM      Component Value Range Status Comment   Specimen Description BLOOD LEFT HAND   Final    Special Requests BOTTLES DRAWN AEROBIC AND ANAEROBIC 5CC   Final    Culture NO GROWTH 5 DAYS   Final    Report Status 12/20/2010 FINAL   Final   STOOL CULTURE     Status: Normal   Collection Time   12/17/10  5:28 PM      Component Value Range Status Comment   Specimen Description STOOL   Final    Special Requests Normal   Final    Culture     Final    Value: NO SALMONELLA, SHIGELLA, CAMPYLOBACTER, OR YERSINIA ISOLATED   Report Status 12/21/2010 FINAL   Final   CLOSTRIDIUM DIFFICILE BY PCR     Status: Normal   Collection Time   12/17/10  5:28 PM      Component Value Range Status Comment   C difficile by pcr NEGATIVE  NEGATIVE  Final   MRSA PCR SCREENING     Status: Normal   Collection Time   12/20/10 11:42 AM      Component Value Range Status Comment   MRSA by PCR NEGATIVE  NEGATIVE  Final     Studies/Results:    Medications: Scheduled Meds:   . antiseptic oral rinse   Mouth Rinse Q4H  . furosemide  200 mg Intravenous BID  . heparin subcutaneous  5,000 Units Subcutaneous Q8H  . hydrALAZINE  10 mg Intravenous Once  . levofloxacin (LEVAQUIN) IV  250 mg Intravenous Q24H  . metoprolol  5 mg Intravenous Q6H  . metronidazole  500 mg Intravenous Q8H  . nicotine  14 mg Transdermal Daily  . pantoprazole (PROTONIX) IV  40 mg Intravenous Q24H  . DISCONTD: albumin human  25 g Intravenous BID  . DISCONTD: HYDROmorphone PCA 0.3 mg/mL   Intravenous Q4H   Continuous Infusions:   . TPN (CLINIMIX) +/- additives    . DISCONTD: sodium chloride 75 mL/hr at 12/21/10 2113   PRN Meds:.acetaminophen, acetaminophen, hydrALAZINE, HYDROmorphone (DILAUDID) injection, LORazepam, DISCONTD:  diphenhydrAMINE, DISCONTD: diphenhydrAMINE, DISCONTD:  HYDROmorphone (DILAUDID) injection, DISCONTD: naloxone, DISCONTD: ondansetron (ZOFRAN) IV, DISCONTD: sodium chloride  Assessment/Plan: 65 y/o male with past medical hx of HTN and GERD presented with 2 day hx of N/V with epigastric pain with CT abd sugegstive of infectuious vs ischemic colitis and not responding to medical / conservative treatment. Seen by GI And surgery and had exploratory laprotomy on 10/27 with rt partial colectomy . Patient now with oliguric renal failure on Hemodialysis.  WBC count increasing steadily over past few days.  Plan  Leukocytosis-  etiology uncertain. Get CT abd/pelvis with contrast, Urine culture (has had a positive UA), Blood cultures (including culture from Dialysis cath) and repeat CXR as he did have an infiltrate on previous CXR Will give one dose of Vanc and switch Levaquin to Primaxin  Acute ? infectious/ ischemic colitis  Patient status post laprotomy with partial hemicolectomy on 10/27 which showed diffusely inflammed terminal ileum. Pathology pending   follow up surgical pathology results  On cipro and flagyl , cipro replaced by levaquin given findings of PNA on CXR - however, he is asymptomatic cont PPI  Cont NG and foley  Diet per surgery   Oliguric ATN - on Hemodialysis started on 10/30 Started post op. Reason unclear. posible prerenal but patient doesnot appear to be dehydrated clinically and BP maintained. Rapid Worsening of renal fn could explain a glomerular pathology like RPGN.   -Appreciate renal follow up -  will be dialyzed today  Pneumonia  CXR done suggestive of a left lower lobe infiltrate  started on levaquin and discontinued cipro - will switch to Primaxin. Monitor o2 sat   HTN  BP currently controlled. No meds for now.    DVT prophylaxis  Full code       LOS: 9 days   Brian [name] 12/22/2010, 10:28 AM

## 2010-12-22 NOTE — Progress Notes (Signed)
Subjective: Interval History: has complaints abdominal pain. He denies any shortness of breath orthopnea or paroxysmal nocturnal dyspnea..  Objective: Vital signs in last 24 hours: Temp:  [97.3 F (36.3 C)-98 F (36.7 C)] 97.9 F (36.6 C) (10/30 2115) Pulse Rate:  [91-110] 96  (10/31 0600) Resp:  [17-34] 23  (10/31 0600) BP: (94-175)/(59-127) 132/91 mmHg (10/31 0600) SpO2:  [91 %-100 %] 96 % (10/31 0600) FiO2 (%):  [100 %] 100 % (10/30 2115) Weight:  [80.8 kg (178 lb 2.1 oz)-81.6 kg (179 lb 14.3 oz)] 178 lb 2.1 oz (80.8 kg) (10/30 2115) Weight change: -4.7 kg (-10 lb 5.8 oz)  Intake/Output from previous day: 10/30 0701 - 10/31 0700 In: 1226.3 [I.V.:766.3; IV Piggyback:460] Out: 1175 [Urine:175; Emesis/NG output:100] Intake/Output this shift:    General appearance: alert, cooperative and fatigued Resp: clear to auscultation bilaterally Cardio: regular rate and rhythm, S1, S2 normal, no murmur, click, rub or gallop GI: Abdomen remains distended, tender, hypoactive. Extremities: extremities normal, atraumatic, no cyanosis or edema  Lab Results:  [facility] 12/22/10 0352 12/21/10 0507  WBC 16.3* 13.5*  HGB 9.3* 9.9*  HCT 27.3* 28.7*  PLT 288 297   BMET:  Basename 12/22/10 0352 12/21/10 0507  NA 137 135  K 4.7 5.0  CL 107 108  CO2 16* 16*  GLUCOSE 107* 105*  BUN 66* 61*  CREATININE 6.80* 6.79*  CALCIUM 7.4* 7.6*   No results found for this basename: PTH:2 in the last 72 hours Iron Studies: No results found for this basename: IRON,TIBC,TRANSFERRIN,FERRITIN in the last 72 hours  Studies/Results: US Renal  12/20/2010  *RADIOLOGY REPORT*  Clinical Data: Increased renal insufficiency status post surgery.  RENAL/URINARY TRACT ULTRASOUND COMPLETE  Comparison:  Abdominal CT 12/15/2010.  Findings:  Right Kidney:  Measures approximately 13.0 cm in length.  There is no hydronephrosis.  There is increased renal cortical echogenicity. No focal cortical abnormality is identified.   There is a small amount of perinephric fluid.  The patient has moderate ascites.  Left Kidney:  Suboptimally visualized, measuring approximately 10.5 cm in length.  There is probable mild cortical thinning.  No hydronephrosis or focal abnormality identified.  Bladder:  Decompressed by Foley catheter.  There is sludge within the gallbladder lumen.  There is nonspecific thickening of the gallbladder wall to 6 mm.  IMPRESSION:  1.  Increased renal cortical echogenicity bilaterally most consistent with "chronic medical renal disease."  No evidence of hydronephrosis.  Left renal visualization is suboptimal. 2.  Ascites may account for a small amount of perinephric fluid on the right and gallbladder wall thickening. Gallbladder inflammation cannot be completely excluded by this examination.  Original Report Authenticated By: Gerrianne Scale, M.D.    I have reviewed the patient's current medications.  Assessment/Plan: Problem #1 acute kidney injury patient is started on hemodialysis yesterday is BUN andcreatinine still remains high and patient is still oliguric.  BUN is 56 and creatinine 6.8. Problem #2 history of  awake acidosis CO2 is still low Problem #3 history of hypertension blood pressure seems to be controlled. Problem #4 history of colitis is status post surgery  patient  febrile however his white blood cell count continued to be high presently his on antibiotics. Problem #5 GERD Problem #6 anemia his H&H is still low. Problem #7 hypoalbuminemia. Recommendation we'll arrange for patient to get dialysis today for 3 and half hours We'll check his basic metabolic panel and CBC in the morning. We'll check his iron studies in the morning. If the  prior study shows anemia of iron deficiency we'll start him on iron supplement. However at this moment because of his sepsis we may need to wait until that has improved. We'll consider also starting on Epogen  next treatment. At this moment patient may  benefit from TPN.    LOS: 9 days   Mahogani Holohan S 12/22/2010,7:03 AM

## 2010-12-22 NOTE — Progress Notes (Signed)
INITIAL ADULT NUTRITION ASSESSMENT Date: 12/22/2010   Time: 1:15 PM Reason for Assessment: TPN initiated  ASSESSMENT: Male 65 y.o.  Dx: Colitis   Past Medical History  Diagnosis Date  . Hypertension   . CVA (cerebral infarction)     Scheduled Meds:   . antiseptic oral rinse   Mouth Rinse Q4H  . furosemide  200 mg Intravenous BID  . heparin subcutaneous  5,000 Units Subcutaneous Q8H  . hydrALAZINE  10 mg Intravenous Once  . imipenem-cilastatin  250 mg Intravenous Q12H  . metronidazole  500 mg Intravenous Q8H  . nicotine  14 mg Transdermal Daily  . pantoprazole (PROTONIX) IV  40 mg Intravenous Q24H  . vancomycin  1,250 mg Intravenous Once  . vancomycin  1,000 mg Intravenous Q M,W,F-HD  . DISCONTD: albumin human  25 g Intravenous BID  . DISCONTD: HYDROmorphone PCA 0.3 mg/mL   Intravenous Q4H  . DISCONTD: levofloxacin (LEVAQUIN) IV  250 mg Intravenous Q24H  . DISCONTD: metoprolol  5 mg Intravenous Q6H  . DISCONTD: vancomycin  1,000 mg Intravenous Q24H   Continuous Infusions:   . TPN (CLINIMIX) +/- additives     And  . fat emulsion    . DISCONTD: sodium chloride 75 mL/hr at 12/21/10 2113  . DISCONTD: TPN (CLINIMIX) +/- additives     PRN Meds:.acetaminophen, acetaminophen, hydrALAZINE, HYDROmorphone (DILAUDID) injection, LORazepam, DISCONTD: diphenhydrAMINE, DISCONTD: diphenhydrAMINE, DISCONTD:  HYDROmorphone (DILAUDID) injection, DISCONTD: naloxone, DISCONTD: ondansetron (ZOFRAN) IV, DISCONTD: sodium chloride  Ht: 5\' 7"  (170.2 cm)  Wt: 180 lb 8.9 oz (81.9 kg)  Ideal Wt: 61.6 kg 66.1 kg (IBW-145# +/- 10%) % Ideal Wt: 124%  Usual Wt: PT nor daughter able to provide % Usual Wt: unknown  Body mass index is 28.28 kg/(m^2).  Food/Nutrition Related Hx: PT s/p exploratory lap with partial colectomy, also acute renal failure and undergoing dialysis tx today during visit. He is NPO and has NGT; denies N/V. He is unable to provide nutr hx due to confusion. Spoke with daughter  Patsy Lager via telephone and she provided very little nutrition background. She "thinks" he has lost wt and has't been eating well but is unable to provide specifics. TPN has been initiated for pt to start today; Clinimix 5/15 @ 50 ml/hr and 20% Lipid Emulsion @ 10 ml/hr. Anuria/Oliguria, third spacing noted per MD. He is at nutrition risk r/t altered GI function and acute kidney failure. I suspect moderate-severe malnutrition given his acute issues.   CMP     Component Value Date/Time   NA 137 12/22/2010 0352   K 4.7 12/22/2010 0352   CL 107 12/22/2010 0352   CO2 16* 12/22/2010 0352   GLUCOSE 107* 12/22/2010 0352   BUN 66* 12/22/2010 0352   CREATININE 6.80* 12/22/2010 0352   CALCIUM 7.4* 12/22/2010 0352   PROT 5.0* 12/20/2010 0019   ALBUMIN 1.6* 12/20/2010 0019   AST 42* 12/20/2010 0019   ALT 11 12/21/2010 0803   ALKPHOS 47 12/20/2010 0019   BILITOT 0.9 12/20/2010 0019   GFRNONAA 8* 12/22/2010 0352   GFRAA 9* 12/22/2010 0352   CBC    Component Value Date/Time   WBC 16.3* 12/22/2010 0352   RBC 3.19* 12/22/2010 0352   HGB 9.3* 12/22/2010 0352   HCT 27.3* 12/22/2010 0352   PLT 288 12/22/2010 0352   MCV 85.6 12/22/2010 0352   MCH 29.2 12/22/2010 0352   MCHC 34.1 12/22/2010 0352   RDW 13.5 12/22/2010 0352   LYMPHSABS 1.0 12/20/2010 0019   MONOABS 1.4* 12/20/2010  0019   EOSABS 0.0 12/20/2010 0019   BASOSABS 0.0 12/20/2010 0019    Intake/Output Summary (Last 24 hours) at 12/22/10 1334 Last data filed at 12/22/10 1150  Gross per 24 hour  Intake 876.25 ml  Output   1150 ml  Net -273.75 ml      Diet Order: NPO   IVF:    TPN (CLINIMIX) +/- additives   And   fat emulsion   DISCONTD: sodium chloride Last Rate: 75 mL/hr at 12/21/10 2113  DISCONTD: TPN (CLINIMIX) +/- additives     Estimated Nutritional Needs:   Kcal:2240-2400 Protein:96-120 grams Fluid:500 cc plus urine output  NUTRITION DIAGNOSIS: -Inadequate oral intake (NI-2.1).  Status: Ongoing  RELATED TO:    -Altered GI function  AS EVIDENCE BY:  -s/p partial colectomy, NPO-status  MONITORING/EVALUATION(Goals): -PT will tol TPN and meet >75% est energy and protein needs  -Monitor advancement of TPN, labs and wts  EDUCATION NEEDS: -Education not appropriate at this time due to pt confusion  INTERVENTION: -Clinimix 5/15 @ 50 ml/hr (provides 852 kcal, 60 grams protein per day) -20% Lipids @ 10 ml/hr (provides 500 kcal/day)  Dietitian [phone]  DOCUMENTATION CODES Per approved criteria  -Severe malnutrition in the context of acute illness or injury    Francene Boyers 12/22/2010, 1:15 PM

## 2010-12-22 NOTE — Progress Notes (Signed)
Pt has order for TPN however, pt has no central line or PICC line to use to infuse.  Paged Dr. Butler Denmark to ask about pt starting on TPN and she stated that she did not order it.  Paged Dr. Leticia Penna regarding the same issue and he stated that he did not order it and did not feel that it was necessary at this time, he felt that we should wait another day or two.  Paged Dr. Kristian Covey regarding the TPN and he did order it.  Since pt does not have a central line or PICC line he stated to restart on IVF as previously ordered and then to discuss with the hospitalist about whether they feel that we need to give it to him or not.  Paged Dr. Butler Denmark to let her know what Dr. Kristian Covey said.  Also paged Dr. Leticia Penna.  Have not heard from either Dr. Butler Denmark or Dr. Leticia Penna at this time.

## 2010-12-22 NOTE — Progress Notes (Signed)
4 Days Post-Op  Subjective: Pain about the same.  No fever or chills.  Nausea controlled.  No flatus.  Pain still with movement.  No CP, No SOB.  No HA or vision changes.  Objective: Vital signs in last 24 hours: Temp:  [97.3 F (36.3 C)-97.9 F (36.6 C)] 97.5 F (36.4 C) (10/31 0800) Pulse Rate:  [91-110] 102  (10/31 0800) Resp:  [17-34] 20  (10/31 0800) BP: (94-175)/(59-127) 127/104 mmHg (10/31 0800) SpO2:  [91 %-100 %] 95 % (10/31 0800) FiO2 (%):  [100 %] 100 % (10/30 2115) Weight:  [80.8 kg (178 lb 2.1 oz)-81.6 kg (179 lb 14.3 oz)] 178 lb 2.1 oz (80.8 kg) (10/30 2115) Last BM Date: 12/17/10  Intake/Output from previous day: 10/30 0701 - 10/31 0700 In: 1226.3 [I.V.:766.3; IV Piggyback:460] Out: 1325 [Urine:175; Emesis/NG output:250] Intake/Output this shift:    General appearance: awake, slight confusion.  Uncomfortable.  NAD. Eyes: PERR, EOMI, no conj palor Resp: Course bilateral breath sounds. GI: Quiet, soft, distended.  Mod-severe diffuse tenderness.  Incisioin c/d/i.    Lab Results:   [facility] 12/22/10 0352 12/21/10 0507  WBC 16.3* 13.5*  HGB 9.3* 9.9*  HCT 27.3* 28.7*  PLT 288 297   BMET  Basename 12/22/10 0352 12/21/10 0507  NA 137 135  K 4.7 5.0  CL 107 108  CO2 16* 16*  GLUCOSE 107* 105*  BUN 66* 61*  CREATININE 6.80* 6.79*  CALCIUM 7.4* 7.6*   PT/INR No results found for this basename: LABPROT:2,INR:2 in the last 72 hours ABG  Basename 12/20/10 1720  PHART 7.315*  HCO3 14.5*    Studies/Results: US Renal  12/20/2010  *RADIOLOGY REPORT*  Clinical Data: Increased renal insufficiency status post surgery.  RENAL/URINARY TRACT ULTRASOUND COMPLETE  Comparison:  Abdominal CT 12/15/2010.  Findings:  Right Kidney:  Measures approximately 13.0 cm in length.  There is no hydronephrosis.  There is increased renal cortical echogenicity. No focal cortical abnormality is identified.  There is a small amount of perinephric fluid.  The patient has moderate  ascites.  Left Kidney:  Suboptimally visualized, measuring approximately 10.5 cm in length.  There is probable mild cortical thinning.  No hydronephrosis or focal abnormality identified.  Bladder:  Decompressed by Foley catheter.  There is sludge within the gallbladder lumen.  There is nonspecific thickening of the gallbladder wall to 6 mm.  IMPRESSION:  1.  Increased renal cortical echogenicity bilaterally most consistent with "chronic medical renal disease."  No evidence of hydronephrosis.  Left renal visualization is suboptimal. 2.  Ascites may account for a small amount of perinephric fluid on the right and gallbladder wall thickening. Gallbladder inflammation cannot be completely excluded by this examination.  Original Report Authenticated By: Gerrianne Scale, M.D.    Anti-infectives: Anti-infectives     Start     Dose/Rate Route Frequency Ordered Stop   12/20/10 2100   ciprofloxacin (CIPRO) IVPB 400 mg  Status:  Discontinued        400 mg 200 mL/hr over 60 Minutes Intravenous Every 24 hours 12/20/10 0947 12/20/10 1423   12/20/10 1430   levofloxacin (LEVAQUIN) IVPB 500 mg        500 mg 100 mL/hr over 60 Minutes Intravenous  Once 12/20/10 1426 12/20/10 1530   12/15/10 2100   ciprofloxacin (CIPRO) IVPB 400 mg  Status:  Discontinued        400 mg 200 mL/hr over 60 Minutes Intravenous Every 12 hours 12/15/10 2037 12/20/10 0947  Assessment/Plan: s/p Procedure(s): EXPLORATORY LAPAROTOMY PARTIAL COLECTOMY CVS: diastolic BP remains elevated.  likely secondary to renal fxn and pain.  GI:  Awating bowel function.  Pain still significant although at this time I do not feel there is any intraabdominal catastrophy occuring.  Continue close monitoring.  Do suspect significant third spacing given exam and renal function.  Renal:  ARF.  HD being coordinated by Dr. Kristian Covey.  Remains anuric/oligouric.  WBC increase.  Continue abx coverage.   If renal function improves and abdominal symptoms  do not, would consider repeat CT at that time.  Following closely.  LOS: 9 days    Yohan Samons C 12/22/2010

## 2010-12-23 ENCOUNTER — Inpatient Hospital Stay (HOSPITAL_COMMUNITY): Payer: Medicare Other

## 2010-12-23 LAB — URINE CULTURE: Colony Count: NO GROWTH

## 2010-12-23 LAB — BASIC METABOLIC PANEL
BUN: 54 mg/dL — ABNORMAL HIGH (ref 6–23)
CO2: 22 mEq/L (ref 19–32)
Chloride: 104 mEq/L (ref 96–112)
Creatinine, Ser: 5.81 mg/dL — ABNORMAL HIGH (ref 0.50–1.35)
GFR calc Af Amer: 11 mL/min — ABNORMAL LOW (ref >90)
Glucose, Bld: 100 mg/dL — ABNORMAL HIGH (ref 70–99)

## 2010-12-23 LAB — CBC
HCT: 22.3 % — ABNORMAL LOW (ref 39.0–52.0)
MCH: 29.8 pg (ref 26.0–34.0)
MCHC: 35 g/dL (ref 30.0–36.0)
MCV: 85.1 fL (ref 78.0–100.0)
RDW: 13.6 % (ref 11.5–15.5)

## 2010-12-23 LAB — HEMOGLOBIN AND HEMATOCRIT, BLOOD
HCT: 22 % — ABNORMAL LOW (ref 39.0–52.0)
Hemoglobin: 7.6 g/dL — ABNORMAL LOW (ref 13.0–17.0)

## 2010-12-23 LAB — PHOSPHORUS: Phosphorus: 5.1 mg/dL — ABNORMAL HIGH (ref 2.3–4.6)

## 2010-12-23 MED ORDER — EPOETIN ALFA 10000 UNIT/ML IJ SOLN
10000.0000 [IU] | INTRAMUSCULAR | Status: DC
  Administered 2010-12-24: 10000 [IU] via INTRAVENOUS
  Filled 2010-12-23: qty 1

## 2010-12-23 MED ORDER — ONDANSETRON HCL 4 MG/2ML IJ SOLN
4.0000 mg | Freq: Four times a day (QID) | INTRAMUSCULAR | Status: DC | PRN
  Administered 2010-12-23: 4 mg via INTRAVENOUS
  Filled 2010-12-23: qty 2

## 2010-12-23 MED ORDER — HEPARIN SODIUM (PORCINE) 1000 UNIT/ML DIALYSIS
1000.0000 [IU] | INTRAMUSCULAR | Status: DC | PRN
  Administered 2010-12-24: 1000 [IU] via INTRAVENOUS_CENTRAL
  Filled 2010-12-23: qty 1

## 2010-12-23 MED ORDER — HEPARIN SODIUM (PORCINE) 1000 UNIT/ML DIALYSIS
500.0000 [IU]/h | INTRAMUSCULAR | Status: DC | PRN
  Filled 2010-12-23: qty 1

## 2010-12-23 NOTE — Progress Notes (Signed)
Subjective: Interval History: has no complaint of shortness of breath or orthopnea. His abdominal pain is getting better. He told me he has moved his bowel yesterday but not today. As this moment is asking to drink water..  Objective: Vital signs in last 24 hours: Temp:  [97.6 F (36.4 C)-98.2 F (36.8 C)] 97.9 F (36.6 C) (11/01 0400) Pulse Rate:  [103-119] 117  (11/01 0600) Resp:  [17-32] 32  (11/01 0600) BP: (90-160)/(53-112) 141/110 mmHg (11/01 0600) SpO2:  [89 %-95 %] 89 % (11/01 0600) Weight:  [79.9 kg (176 lb 2.4 oz)-81.9 kg (180 lb 8.9 oz)] 178 lb 12.7 oz (81.1 kg) (11/01 0500) Weight change: 0.3 kg (10.6 oz)  Intake/Output from previous day: 10/31 0701 - 11/01 0700 In: 1857.5 [I.V.:1197.5; NG/GT:100; IV Piggyback:560] Out: 2175 [Urine:75; Emesis/NG output:100] Intake/Output this shift:    General appearance: alert, cooperative, cachectic, fatigued and no distress Resp: clear to auscultation bilaterally Cardio: regular rate and rhythm, S1, S2 normal, no murmur, click, rub or gallop GI: Distention seems to be getting better, he has only mild tenderness, still hypoactive. Extremities: extremities normal, atraumatic, no cyanosis or edema  Lab Results:  [facility] 12/23/10 0431 12/22/10 0352  WBC 16.6* 16.3*  HGB 7.8* 9.3*  HCT 22.3* 27.3*  PLT 279 288   BMET:  Basename 12/23/10 0431 12/22/10 0352  NA 136 137  K 4.2 4.7  CL 104 107  CO2 22 16*  GLUCOSE 100* 107*  BUN 54* 66*  CREATININE 5.81* 6.80*  CALCIUM 7.3* 7.4*   No results found for this basename: PTH:2 in the last 72 hours Iron Studies: No results found for this basename: IRON,TIBC,TRANSFERRIN,FERRITIN in the last 72 hours  Studies/Results: Ct Abdomen Pelvis W Contrast  12/22/2010  *RADIOLOGY REPORT*  Clinical Data: Postop partial colectomy on 10/29, leukocytosis, abdominal tenderness  CT ABDOMEN AND PELVIS WITH CONTRAST  Technique:  Multidetector CT imaging of the abdomen and pelvis was performed  following the standard protocol during bolus administration of intravenous contrast.  Contrast: OMNIPAQUE IOHEXOL 300 MG/ML IV SOLN  Comparison: 12/15/2010  Findings: Moderate bilateral pleural effusions with associated lower lobe atelectasis.  Enteric tube terminating in the gastric body.  Liver, spleen, pancreas, and adrenal glands are within normal limits.  Gallbladder is notable for a layering sludge/small stones.  Kidneys are within normal limits.  No hydronephrosis.  Status post right hemicolectomy.  Anastomosis in the right anterior abdomen (series 2/image 42).  Mildly prominent loops of small bowel in the left mid abdomen, without focal transition, likely reflecting postoperative adynamic ileus.  Large volume abdominopelvic ascites, increased.  No evidence of free air.  Atherosclerotic calcifications of the abdominal aorta and branch vessels.  Right groin catheter terminating in the IVC.  No suspicious abdominopelvic lymphadenopathy.  Prostate is unremarkable.  Bladder is notable for indwelling Foley and tiny focus of nondependent gas.  Skin staples/postsurgical changes in the anterior abdominal wall, including a small amount of subcutaneous gas in the left anterior abdominal wall (series 2/image 52).  Body wall edema.  Degenerative changes of the visualized thoracolumbar spine, most severe at L5-S1.  IMPRESSION:  Status post right hemicolectomy.  No evidence of free air.  Suspected postoperative adynamic small bowel ileus.  Large volume abdominopelvic ascites.  Body wall edema.  Moderate bilateral pleural effusions with associated lower lobe atelectasis.  Layering sludge/small stones in the gallbladder.  Additional ancillary findings as above.  Original Report Authenticated By: Charline Bills, M.D.    I have reviewed the patient's current  medications.  Assessment/Plan: Problem #1 acute renal failure presently patient remains oliguric. Patient received hemodialysis yesterday BUN and creatinine  is stable normal potassium. Problem #2 history of hypertension blood pressure seems to be controlled very well Problem #3 history of colitis status post surgery patient is a febrile still his white blood cell count seems to be high. Problem #4 history of malnutrition patient is still was NG suction and is not on any type of feeding. Problem #5 anemia his hemoglobin and hematocrit at this moment is stable. Problem #6 history of metabolic acidosis he CO2 has gone up to 22 hence seems to be correcting. Recommendation we'll DC Lasix as patient is not making any urine We'll continue his hemodialysis tomorrow If her patient continues to need dialysis probably many to changes catheter next week otherwise he may need to send patient for Ditek catheter placement if clinically stable. If  oral feeding is not going to be started probably patient may benefit from  TPN. We'll check his basic metabolic panel CBC in the morning.   LOS: 10 days   Mylen Mangan S 12/23/2010,8:38 AM

## 2010-12-23 NOTE — Progress Notes (Signed)
Pt was ordered TPN yesterday but does not have a central line therefore it is not infusing.  Will try to contact Dr Kristian Covey to see if plans are to insert PICC and resume TPN.    Scharlene Gloss, PharmD
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
## 2010-12-23 NOTE — Progress Notes (Signed)
UR Chart Review Completed  

## 2010-12-24 ENCOUNTER — Inpatient Hospital Stay (HOSPITAL_COMMUNITY): Payer: Medicare Other

## 2010-12-24 DIAGNOSIS — I776 Arteritis, unspecified: Secondary | ICD-10-CM

## 2010-12-24 DIAGNOSIS — N179 Acute kidney failure, unspecified: Secondary | ICD-10-CM | POA: Diagnosis not present

## 2010-12-24 LAB — BASIC METABOLIC PANEL
Calcium: 7.6 mg/dL — ABNORMAL LOW (ref 8.4–10.5)
Creatinine, Ser: 7.4 mg/dL — ABNORMAL HIGH (ref 0.50–1.35)
GFR calc Af Amer: 8 mL/min — ABNORMAL LOW (ref >90)
GFR calc non Af Amer: 7 mL/min — ABNORMAL LOW (ref >90)
Sodium: 138 mEq/L (ref 135–145)

## 2010-12-24 LAB — CBC
Platelets: 348 10*3/uL (ref 150–400)
RBC: 2.59 MIL/uL — ABNORMAL LOW (ref 4.22–5.81)
RDW: 14.3 % (ref 11.5–15.5)
WBC: 17.1 10*3/uL — ABNORMAL HIGH (ref 4.0–10.5)

## 2010-12-24 LAB — ABO/RH: ABO/RH(D): O POS

## 2010-12-24 LAB — PREPARE RBC (CROSSMATCH)

## 2010-12-24 MED ORDER — SODIUM CHLORIDE 0.9 % IJ SOLN
INTRAMUSCULAR | Status: AC
  Filled 2010-12-24: qty 3

## 2010-12-24 NOTE — Discharge Planning (Signed)
DISCHARGE SUMMARY  Brian [name]  MR#: [id number]  DOB:11-11-1945  Date of Admission: 12/13/2010 Date of Discharge: 12/24/2010  Attending Physician:Kaylani Fromme Linna Darner  Patient's NWG:[id number],[id number] Molly Maduro, MD  Consults:Treatment Team:  Fabio Bering- Surgeon Arlyce Harman, MD- GI [facility]- Nephrology  Presenting Complaint: Abdominal pain nausea and vomiting  Discharge Diagnoses: Principal Problem:  *Vasculitis Active Problems:  Renal failure, acute  Nausea and vomiting  Abdominal pain, acute, epigastric  Leukocytosis  Colitis  HTN (hypertension)  GERD (gastroesophageal reflux disease)     Discharge Medications: Current Discharge Medication List    STOP taking these medications     aspirin EC 81 MG tablet           Hospital Course:  This is a 65 year old African American male with no past medical history who presented withcomplaints of abdominal pain nausea and vomiting. He was initially is suspected to have enteritis. CT scan of the abdomen and pelvis revealed small bowel wall thickening, mesenteric edema and ascites of undetermined etiology.   A few days later the patient became febrile and therefore it was decided to start antibiotics. He was started on ciprofloxacin and Flagyl to treat for his suspected enteritis. A repeat CT scan revealed marked ascending colonic wall thickening. Additionally, it revealed enlargement and edema surrounding the appendix.  At this point gastroenterology and surgery consults were requested. The patient's conditions subsequently worsened and he developed vomiting once again. Dr. Leticia Penna decided to take the patient to the OR for exploratory laparotomy and possible bowel resection.  The patient underwent surgery on 12/18/2010 and a right hemicolectomy with ileocolonic anastomosis was performed for diagnosis of diffuse terminal ileitis and colitis.  After surgery it was noted that his creatinine began rising rapidly.  On October 28 BUN was 43 creatinine 5.8. The patient subsequently became oliguric. Creatinine rose to 6.8 on 12/21/2010. Etiology for his acute renal failure is also uncertain. Unfortunately the patient required dialysis on 10/30 and 10/31. Despite having 2 treatments of dialysis the patient's creatinine has risen again. He is having his third dialysis today. His BUN is 77 and creatinine is 7.4 today.   Today we have received his pathology report from the colectomy which reveals a vasculitis. As the patient will require a higher level of care for the treatment of his vasculitis he is being transferred to [facility].  Today is postop day 6 and the patient is still unable to tolerate clear liquids. Dr. Leticia Penna suspects that the patient will need a central line and be started on TPN soon as his abdomen has not yet recovered from surgery.  In addition the patient has a significant amount of ascites which may be related to continued inflammation within his colon.  Furthermore the patient is anemic. Hemoglobin is 7.6. We're transfusing 1 unit of packed red blood cells now.  Also noted is a rising WBC count which is 17.1 and today. This has steadily increased from 11.9 on 10/28. In order to further investigate cause for leukocytosis, I ordered blood cultures urine culture and a CT of his abdomen and pelvis a couple of days ago. Thus far no etiology for his leukocytosis has been found. He did have a chest x-ray which revealed a mild right lower lobe infiltrate versus atelectasis. He currently does not have any symptoms of pneumonia. However, he is requiring 3 L of oxygen. The patient is currently on the following antibiotics: Zosyn and Flagyl. A dose of vancomycin was also given on 10/31. It may  be that his increasing WBC count is related to vasculitis. However, it is noted that he did not have leukocytosis when he was first admitted. Therefore it is more likely that the current leukocytosis may be  secondary to a stress response versus underlying infection.    Day of Discharge BP 103/68  Pulse 115  Temp(Src) 97.8 F (36.6 C) (Oral)  Resp 22  Ht 5\' 7"  (1.702 m)  Wt 81.602 kg (179 lb 14.4 oz)  BMI 28.18 kg/m2  SpO2 95%  Physical Exam:  General: Patient is awake alert and oriented x3. He is in no acute distress. Lungs: Clear lungs with diminished breath sounds at bases.  CVS: Regular rate and rhythm with no murmurs. He is slightly tachycardic. Abdomen: Distended with positive fluid thrill and a moderate amount of diffuse tenderness. No rebound tenderness is noted.   Results for orders placed during the hospital encounter of 12/13/10 (from the past 24 hour(s))  HEMOGLOBIN AND HEMATOCRIT, BLOOD     Status: Abnormal   Collection Time   12/23/10  6:42 PM      Component Value Range   Hemoglobin 7.6 (*) 13.0 - 17.0 (g/dL)   HCT 40.9 (*) 81.1 - 52.0 (%)  BASIC METABOLIC PANEL     Status: Abnormal   Collection Time   12/24/10  5:15 AM      Component Value Range   Sodium 138  135 - 145 (mEq/L)   Potassium 4.8  3.5 - 5.1 (mEq/L)   Chloride 105  96 - 112 (mEq/L)   CO2 19  19 - 32 (mEq/L)   Glucose, Bld 109 (*) 70 - 99 (mg/dL)   BUN 77 (*) 6 - 23 (mg/dL)   Creatinine, Ser 9.14 (*) 0.50 - 1.35 (mg/dL)   Calcium 7.6 (*) 8.4 - 10.5 (mg/dL)   GFR calc non Af Amer 7 (*) >90 (mL/min)   GFR calc Af Amer 8 (*) >90 (mL/min)  CBC     Status: Abnormal   Collection Time   12/24/10  5:15 AM      Component Value Range   WBC 17.1 (*) 4.0 - 10.5 (K/uL)   RBC 2.59 (*) 4.22 - 5.81 (MIL/uL)   Hemoglobin 7.6 (*) 13.0 - 17.0 (g/dL)   HCT 78.2 (*) 95.6 - 52.0 (%)   MCV 86.1  78.0 - 100.0 (fL)   MCH 29.3  26.0 - 34.0 (pg)   MCHC 34.1  30.0 - 36.0 (g/dL)   RDW 21.3  08.6 - 57.8 (%)   Platelets 348  150 - 400 (K/uL)     Currently, the patient's condition is highly guarded. He is being transferred to [facility]. His case has been discussed with his daughter. Both the patient and the  daughter are in agreement with the above mentioned plan.  SignedJoya Salm 12/24/2010, 1:04 PM

## 2010-12-24 NOTE — Progress Notes (Signed)
Subjective: Patient was not seen. This is a short GI interval note.  Objective: Vital signs in last 24 hours: Temp:  [97.6 F (36.4 C)-98.2 F (36.8 C)] 97.8 F (36.6 C) (11/02 0800) Pulse Rate:  [103-116] 103  (11/02 0800) Resp:  [17-35] 19  (11/02 0800) BP: (113-151)/(75-122) 142/99 mmHg (11/02 0800) SpO2:  [88 %-97 %] 95 % (11/02 0800) Weight:  [179 lb 14.4 oz (81.602 kg)] 179 lb 14.4 oz (81.602 kg) (11/02 0500) Last BM Date: 12/22/10 .  Intake/Output from previous day: 11/01 0701 - 11/02 0700 In: 3676 [P.O.:420; I.V.:1800; NG/GT:850; IV Piggyback:606] Out: 75 [Urine:75] Intake/Output this shift:    Lab Results: CBC  Basename 12/24/10 0515 12/23/10 1842 12/23/10 0431 12/22/10 0352  WBC 17.1* -- 16.6* 16.3*  HGB 7.6* 7.6* 7.8* --  HCT 22.3* 22.0* 22.3* --  MCV 86.1 -- 85.1 85.6  PLT 348 -- 279 288   BMET  Basename 12/24/10 0515 12/23/10 0431 12/22/10 0352  NA 138 136 137  K 4.8 4.2 4.7  CL 105 104 107  CO2 19 22 16*  GLUCOSE 109* 100* 107*  BUN 77* 54* 66*  CREATININE 7.40* 5.81* 6.80*  CALCIUM 7.6* 7.3* 7.4*    Assessment/Plan:  Surgical path shows small bowel transmural necrosis and vasculitis. Appendix specimen showed acute inflammation with vasculitis.   Patient has positive ANA, low C3 complement, lower extremity purpura.  Discussed with Dr. Darrick Penna. Patient's clinical condition likely due to untreated vasculitis. She has called patient's attending, Dr. Butler Denmark. Patient will be transferred to facility offering rheumatology and nephrology coverage. There is no rheumatology coverage at [facility] therefore attempt to transfer [facility] will be made.
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
## 2010-12-24 NOTE — Progress Notes (Signed)
Pt refused a repeat H&H as ordered by MD. Dr. Butler Denmark made aware.

## 2010-12-24 NOTE — Discharge Summary (Signed)
Update called and given to Lurlean Leyden, RN at [facility].

## 2010-12-24 NOTE — Progress Notes (Signed)
ANTIBIOTIC CONSULT NOTE   Pharmacy Consult for vancomycin and primaxin Indication: pneumonia  No Known Allergies  Patient Measurements: Height: 5\' 7"  (170.2 cm) Weight: 179 lb 14.4 oz (81.602 kg) IBW/kg (Calculated) : 66.1   Vital Signs: Temp: 97.8 F (36.6 C) (11/02 0800) Temp src: Oral (11/02 0800) BP: 142/99 mmHg (11/02 0800) Pulse Rate: 103  (11/02 0800) Intake/Output from previous day: 11/01 0701 - 11/02 0700 In: 3676 [P.O.:420; I.V.:1800; NG/GT:850; IV Piggyback:606] Out: 75 [Urine:75] Intake/Output from this shift:    Labs:  Basename 12/24/10 0515 12/23/10 1842 12/23/10 0431 12/22/10 0352  WBC 17.1* -- 16.6* 16.3*  HGB 7.6* 7.6* 7.8* --  PLT 348 -- 279 288  LABCREA -- -- -- --  CREATININE 7.40* -- 5.81* 6.80*   Estimated Creatinine Clearance: 10.4 ml/min (by C-G formula based on Cr of 7.4). No results found for this basename: VANCOTROUGH:2,VANCOPEAK:2,VANCORANDOM:2,GENTTROUGH:2,GENTPEAK:2,GENTRANDOM:2,TOBRATROUGH:2,TOBRAPEAK:2,TOBRARND:2,AMIKACINPEAK:2,AMIKACINTROU:2,AMIKACIN:2, in the last 72 hours   Microbiology: Recent Results (from the past 720 hour(s))  CULTURE, BLOOD (ROUTINE X 2)     Status: Normal   Collection Time   12/15/10  9:14 PM      Component Value Range Status Comment   Specimen Description BLOOD LEFT HAND   Final    Special Requests BOTTLES DRAWN AEROBIC AND ANAEROBIC 5CC   Final    Culture NO GROWTH 5 DAYS   Final    Report Status 12/20/2010 FINAL   Final   CULTURE, BLOOD (ROUTINE X 2)     Status: Normal   Collection Time   12/15/10  9:15 PM      Component Value Range Status Comment   Specimen Description BLOOD LEFT HAND   Final    Special Requests BOTTLES DRAWN AEROBIC AND ANAEROBIC 5CC   Final    Culture NO GROWTH 5 DAYS   Final    Report Status 12/20/2010 FINAL   Final   STOOL CULTURE     Status: Normal   Collection Time   12/17/10  5:28 PM      Component Value Range Status Comment   Specimen Description STOOL   Final    Special  Requests Normal   Final    Culture     Final    Value: NO SALMONELLA, SHIGELLA, CAMPYLOBACTER, OR YERSINIA ISOLATED   Report Status 12/21/2010 FINAL   Final   CLOSTRIDIUM DIFFICILE BY PCR     Status: Normal   Collection Time   12/17/10  5:28 PM      Component Value Range Status Comment   C difficile by pcr NEGATIVE  NEGATIVE  Final   MRSA PCR SCREENING     Status: Normal   Collection Time   12/20/10 11:42 AM      Component Value Range Status Comment   MRSA by PCR NEGATIVE  NEGATIVE  Final   CULTURE, BLOOD (ROUTINE X 2)     Status: Normal (Preliminary result)   Collection Time   12/22/10 10:19 AM      Component Value Range Status Comment   Specimen Description BLOOD RIGHT HAND   Final    Special Requests BOTTLES DRAWN AEROBIC AND ANAEROBIC 5CC   Final    Culture NO GROWTH 1 DAY   Final    Report Status PENDING   Incomplete   CULTURE, BLOOD (ROUTINE X 2)     Status: Normal (Preliminary result)   Collection Time   12/22/10 12:08 PM      Component Value Range Status Comment   Specimen Description BLOOD HEMODIALYSIS  CATHETER DRAWN BY RN   Final    Special Requests BOTTLES DRAWN AEROBIC AND ANAEROBIC 12CC   Final    Culture NO GROWTH 1 DAY   Final    Report Status PENDING   Incomplete   URINE CULTURE     Status: Normal   Collection Time   12/22/10  5:25 PM      Component Value Range Status Comment   Specimen Description URINE, CATHETERIZED   Final    Special Requests NONE   Final    Setup Time [id number]   Final    Colony Count NO GROWTH   Final    Culture NO GROWTH   Final    Report Status 12/23/2010 FINAL   Final    Medical History: Past Medical History  Diagnosis Date  . Hypertension   . CVA (cerebral infarction)   . Colitis 12/16/2010   Medications:  Scheduled:     . antiseptic oral rinse   Mouth Rinse Q4H  . epoetin alfa  10,000 Units Intravenous 3 times weekly  . heparin subcutaneous  5,000 Units Subcutaneous Q8H  . hydrALAZINE  10 mg Intravenous Once  .  imipenem-cilastatin  250 mg Intravenous Q12H  . metronidazole  500 mg Intravenous Q8H  . nicotine  14 mg Transdermal Daily  . pantoprazole (PROTONIX) IV  40 mg Intravenous Q24H  . vancomycin  1,000 mg Intravenous Q M,W,F-HD  . DISCONTD: furosemide  200 mg Intravenous BID   Assessment: ESRD requiring dialysis Meds adjusted for Renal Function  Goal of Therapy:  Eradicate infection.  Plan: Vancomycin 1000mg  iv after each dialysis Primaxin 250mg  iv q12hrs. F/U Micro data.  Margo Aye, Richa Shor A 12/24/2010,8:44 AM

## 2010-12-24 NOTE — Progress Notes (Signed)
DISCUSSED CASE WITH DR. Azzie Roup, [facility], RHEUMATOLOGY. RECOMMENDED RHEUMATOID FACTOR, CRYOGLOBULINS, AND CONSIDER A KIDNEY BX. Felt unusual to have extensive sml vessel vasculitis without evidence of medium or large vessel disease. Did not feel steroids were indicated at this time. Agree pt need to be transferred to higher level of care.

## 2010-12-24 NOTE — Plan of Care (Signed)
Pt new to Hemodialysis.  To be transferred to different facility after dialysis.

## 2010-12-24 NOTE — Progress Notes (Signed)
Pt discharged/transfered to [facility] in [facility] via [facility]. Pt is Alert & Oriented.  All questions and concerns addressed with Nurses with [facility].  Pt's daughter: Teressa Lower is aware of transfer.  Report given to [facility] Nurse: Lurlean Leyden, RN.  Pt is to be taken to room A853.  Pt is in stable condition.

## 2010-12-24 NOTE — Progress Notes (Signed)
Patient refused Lab to preform ordered Lab work.  Has increase anxiety and agitation JY:NWGN and prognosis.  Is stating that he wishes to leave facility.  Ordered Ativan 1mg  IV PRN administered to pt.  Dr. Darrick Penna in room assessing and speaking with patient.  Dr. Darrick Penna called Patient's daughter on phone to explain diagnosis/prognosis.  Patient talked to daughter on phone and demands she comes and gets him out of the hospital. Will continue to assess.
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
## 2010-12-27 LAB — CULTURE, BLOOD (ROUTINE X 2)

## 2010-12-31 HISTORY — PX: COLONOSCOPY: SHX174

## 2010-12-31 LAB — ANTI-NEUTROPHIL ANTIBODY

## 2011-04-19 ENCOUNTER — Encounter (HOSPITAL_COMMUNITY): Payer: Self-pay | Admitting: Emergency Medicine

## 2011-04-19 ENCOUNTER — Emergency Department (HOSPITAL_COMMUNITY): Payer: Medicare Other

## 2011-04-19 ENCOUNTER — Inpatient Hospital Stay (HOSPITAL_COMMUNITY)
Admission: EM | Admit: 2011-04-19 | Discharge: 2011-04-22 | DRG: 372 | Disposition: A | Discharge status: Skilled Nursing Facility | Payer: Medicare Other | Attending: Family Medicine | Admitting: Family Medicine

## 2011-04-19 DIAGNOSIS — R1013 Epigastric pain: Secondary | ICD-10-CM

## 2011-04-19 DIAGNOSIS — R197 Diarrhea, unspecified: Secondary | ICD-10-CM | POA: Diagnosis present

## 2011-04-19 DIAGNOSIS — R112 Nausea with vomiting, unspecified: Secondary | ICD-10-CM | POA: Diagnosis present

## 2011-04-19 DIAGNOSIS — I129 Hypertensive chronic kidney disease with stage 1 through stage 4 chronic kidney disease, or unspecified chronic kidney disease: Secondary | ICD-10-CM | POA: Diagnosis present

## 2011-04-19 DIAGNOSIS — Z681 Body mass index (BMI) 19 or less, adult: Secondary | ICD-10-CM

## 2011-04-19 DIAGNOSIS — K529 Noninfective gastroenteritis and colitis, unspecified: Secondary | ICD-10-CM | POA: Diagnosis present

## 2011-04-19 DIAGNOSIS — Z8673 Personal history of transient ischemic attack (TIA), and cerebral infarction without residual deficits: Secondary | ICD-10-CM

## 2011-04-19 DIAGNOSIS — K219 Gastro-esophageal reflux disease without esophagitis: Secondary | ICD-10-CM

## 2011-04-19 DIAGNOSIS — D649 Anemia, unspecified: Secondary | ICD-10-CM

## 2011-04-19 DIAGNOSIS — R1033 Periumbilical pain: Secondary | ICD-10-CM | POA: Diagnosis present

## 2011-04-19 DIAGNOSIS — D631 Anemia in chronic kidney disease: Secondary | ICD-10-CM | POA: Diagnosis present

## 2011-04-19 DIAGNOSIS — I776 Arteritis, unspecified: Secondary | ICD-10-CM | POA: Diagnosis present

## 2011-04-19 DIAGNOSIS — N183 Chronic kidney disease, stage 3 unspecified: Secondary | ICD-10-CM | POA: Diagnosis present

## 2011-04-19 DIAGNOSIS — D72829 Elevated white blood cell count, unspecified: Secondary | ICD-10-CM

## 2011-04-19 DIAGNOSIS — A0472 Enterocolitis due to Clostridium difficile, not specified as recurrent: Principal | ICD-10-CM | POA: Diagnosis present

## 2011-04-19 DIAGNOSIS — E46 Unspecified protein-calorie malnutrition: Secondary | ICD-10-CM | POA: Diagnosis present

## 2011-04-19 DIAGNOSIS — N179 Acute kidney failure, unspecified: Secondary | ICD-10-CM

## 2011-04-19 DIAGNOSIS — R64 Cachexia: Secondary | ICD-10-CM | POA: Diagnosis present

## 2011-04-19 DIAGNOSIS — I10 Essential (primary) hypertension: Secondary | ICD-10-CM

## 2011-04-19 DIAGNOSIS — Z9049 Acquired absence of other specified parts of digestive tract: Secondary | ICD-10-CM

## 2011-04-19 LAB — COMPREHENSIVE METABOLIC PANEL
ALT: 16 U/L (ref 0–53)
AST: 20 U/L (ref 0–37)
Alkaline Phosphatase: 114 U/L (ref 39–117)
Calcium: 8.3 mg/dL — ABNORMAL LOW (ref 8.4–10.5)
Potassium: 4 mEq/L (ref 3.5–5.1)
Sodium: 133 mEq/L — ABNORMAL LOW (ref 135–145)
Total Protein: 7.2 g/dL (ref 6.0–8.3)

## 2011-04-19 LAB — URINE MICROSCOPIC-ADD ON

## 2011-04-19 LAB — COMPREHENSIVE METABOLIC PANEL WITH GFR
Albumin: 1.7 g/dL — ABNORMAL LOW (ref 3.5–5.2)
BUN: 21 mg/dL (ref 6–23)
CO2: 21 meq/L (ref 19–32)
Chloride: 105 meq/L (ref 96–112)
Creatinine, Ser: 1.33 mg/dL (ref 0.50–1.35)
GFR calc Af Amer: 64 mL/min — ABNORMAL LOW (ref >90)
GFR calc non Af Amer: 55 mL/min — ABNORMAL LOW (ref >90)
Glucose, Bld: 87 mg/dL (ref 70–99)
Total Bilirubin: 0.3 mg/dL (ref 0.3–1.2)

## 2011-04-19 LAB — CBC
HCT: 25.8 % — ABNORMAL LOW (ref 39.0–52.0)
Hemoglobin: 8.2 g/dL — ABNORMAL LOW (ref 13.0–17.0)
MCH: 28.5 pg (ref 26.0–34.0)
MCHC: 31.8 g/dL (ref 30.0–36.0)
MCV: 89.6 fL (ref 78.0–100.0)
Platelets: 282 10*3/uL (ref 150–400)
RBC: 2.88 MIL/uL — ABNORMAL LOW (ref 4.22–5.81)
RDW: 16.7 % — ABNORMAL HIGH (ref 11.5–15.5)
WBC: 8.2 K/uL (ref 4.0–10.5)

## 2011-04-19 LAB — URINALYSIS, ROUTINE W REFLEX MICROSCOPIC
Bilirubin Urine: NEGATIVE
Glucose, UA: NEGATIVE mg/dL
Ketones, ur: NEGATIVE mg/dL
Nitrite: NEGATIVE
Protein, ur: >300 mg/dL — AB
Specific Gravity, Urine: 1.017 (ref 1.005–1.030)
Urobilinogen, UA: 0.2 mg/dL (ref 0.0–1.0)
pH: 6 (ref 5.0–8.0)

## 2011-04-19 LAB — DIFFERENTIAL
Basophils Absolute: 0 K/uL (ref 0.0–0.1)
Basophils Relative: 0 % (ref 0–1)
Eosinophils Absolute: 0.2 10*3/uL (ref 0.0–0.7)
Eosinophils Relative: 2 % (ref 0–5)
Lymphocytes Relative: 19 % (ref 12–46)
Lymphs Abs: 1.6 K/uL (ref 0.7–4.0)
Monocytes Absolute: 1.1 10*3/uL — ABNORMAL HIGH (ref 0.1–1.0)
Monocytes Relative: 14 % — ABNORMAL HIGH (ref 3–12)
Neutro Abs: 5.3 K/uL (ref 1.7–7.7)
Neutrophils Relative %: 65 % (ref 43–77)

## 2011-04-19 LAB — LACTIC ACID, PLASMA: Lactic Acid, Venous: 0.9 mmol/L (ref 0.5–2.2)

## 2011-04-19 MED ORDER — SODIUM CHLORIDE 0.9 % IV SOLN
Freq: Once | INTRAVENOUS | Status: AC
  Administered 2011-04-19: 18:00:00 via INTRAVENOUS

## 2011-04-19 MED ORDER — HYDROMORPHONE HCL PF 1 MG/ML IJ SOLN
1.0000 mg | Freq: Once | INTRAMUSCULAR | Status: AC
  Administered 2011-04-19: 1 mg via INTRAVENOUS
  Filled 2011-04-19: qty 1

## 2011-04-19 MED ORDER — IOHEXOL 300 MG/ML  SOLN
100.0000 mL | Freq: Once | INTRAMUSCULAR | Status: AC | PRN
  Administered 2011-04-19: 75 mL via INTRAVENOUS

## 2011-04-19 NOTE — ED Notes (Signed)
Pt from [facility]. Has had llq abdominal pain x 3 days. 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

## 2011-04-19 NOTE — ED Provider Notes (Signed)
History     CSN: [id number]  Arrival date & time 04/19/11  1726   First MD Initiated Contact with Patient 04/19/11 1805      Chief Complaint  Patient presents with  . Abdominal Pain    x 3 days, left lower quadrant    (Consider location/radiation/quality/duration/timing/severity/associated sxs/prior treatment) HPI Comments: Pt is brought from [facility] where he has been since about October following colectomy for some kind of vascular problem with his intestines.  Pt is a very poor historian.  He was admitted in October 2012 due to colitis.  Since then, he has had some chronic abd pain, but since last night, pain had gotten markedly worse, associated with wattery diarrhea.  No N/V.  He had taken oxycodone which he takes daily at his facility, with no sig relief.  Pt is unsure if he has had fever, denies chills, no CP, SOB, coughing.  No black stools per pt.  No back pain . Pain in abd is diffuse.    The history is provided by the patient and medical records.    Past Medical History  Diagnosis Date  . Hypertension   . CVA (cerebral infarction)   . Colitis 12/16/2010    Past Surgical History  Procedure Date  . Back surgery 2002/2009  . Laparotomy 12/18/2010    Procedure: EXPLORATORY LAPAROTOMY;  Surgeon: Fabio Bering;  Location: [facility];  Service: General;  Laterality: N/A;  . Partial colectomy 12/18/2010    Procedure: PARTIAL COLECTOMY;  Surgeon: Fabio Bering;  Location: [facility];  Service: General;  Laterality: N/A;    Family History  Problem Relation Age of Onset  . Colon cancer Neg Hx   . Liver disease Neg Hx     History  Substance Use Topics  . Smoking status: Current Everyday Smoker -- 0.2 packs/day for 40 years  . Smokeless tobacco: Not on file   Comment: 2-3 cigarettes per day  . Alcohol Use: Yes     Occ      Review of Systems  Constitutional: Negative for fever.  Respiratory: Negative for cough.   Cardiovascular: Negative for chest pain.    Gastrointestinal: Positive for abdominal pain and diarrhea. Negative for nausea and vomiting.  Genitourinary: Negative for urgency, flank pain and decreased urine volume.  Musculoskeletal: Negative for back pain.  Skin: Negative for rash.  All other systems reviewed and are negative.    Allergies  Review of patient's allergies indicates no known allergies.  Home Medications   Current Outpatient Rx  Name Route Sig Dispense Refill  . ACETAMINOPHEN 325 MG PO TABS Oral Take 650 mg by mouth every 4 (four) hours as needed. For temp > 101.    Marland Kitchen AMLODIPINE BESYLATE 5 MG PO TABS Oral Take 5 mg by mouth daily.    . CYCLOBENZAPRINE HCL 5 MG PO TABS Oral Take 5 mg by mouth daily. 7 day course of therapy, ending on 04/23/11    . EUCERIN EX Topical Apply 1 application topically daily. Applied bilaterally to legs and feet.    . FENTANYL 25 MCG/HR TD PT72 Transdermal Place 1 patch onto the skin every 3 (three) days.    Marland Kitchen FOLIC ACID 1 MG PO TABS Oral Take 1 mg by mouth daily.    Marland Kitchen LANSOPRAZOLE 30 MG PO TBDP Oral Take 30 mg by mouth daily.    Marland Kitchen METOPROLOL TARTRATE 25 MG PO TABS Oral Take 12.5 mg by mouth 2 (two) times daily.    Marland Kitchen  ADULT MULTIVITAMIN W/MINERALS CH Oral Take 1 tablet by mouth daily.    . OXYCODONE HCL 5 MG PO TABS Oral Take 5-10 mg by mouth every 4 (four) hours as needed. For mild-severe pain.    Marland Kitchen POTASSIUM CHLORIDE CRYS ER 20 MEQ PO TBCR Oral Take 40 mEq by mouth daily.    Marland Kitchen PROMETHAZINE HCL 25 MG PO TABS Oral Take 25 mg by mouth every 6 (six) hours as needed. For nausea/vomiting.    Marland Kitchen SACCHAROMYCES BOULARDII 250 MG PO CAPS Oral Take 250 mg by mouth 2 (two) times daily.    Marland Kitchen SEVELAMER CARBONATE 800 MG PO TABS Oral Take 2,400 mg by mouth 2 (two) times daily.    . WARFARIN SODIUM 2 MG PO TABS Oral Take 2 mg by mouth daily. Given with 5mg  tablet to total 7mg  daily.    . WARFARIN SODIUM 5 MG PO TABS Oral Take 5 mg by mouth daily. Given with 2mg  tablet to total 7mg .      BP 102/69  Pulse  103  Temp(Src) 98.5 F (36.9 C) (Oral)  Resp 16  SpO2 98%  Physical Exam  Nursing note and vitals reviewed. Constitutional: He is oriented to person, place, and time. He appears well-developed. He appears distressed.  HENT:  Head: Normocephalic and atraumatic.  Eyes: Conjunctivae are normal. No scleral icterus.  Neck: Neck supple.  Cardiovascular: Normal rate.   Pulmonary/Chest: Effort normal. No respiratory distress. He has no wheezes.  Abdominal: Soft. Normal appearance. He exhibits no mass. Bowel sounds are decreased. There is generalized tenderness. There is guarding. There is no CVA tenderness. No hernia.       Hot to touch.  Well healed midline surgical incision  Neurological: He is alert and oriented to person, place, and time.  Skin: Skin is warm, dry and intact. No rash noted. He is not diaphoretic.    ED Course  Procedures (including critical care time)  Labs Reviewed  CBC - Abnormal; Notable for the following:    RBC 2.88 (*)    Hemoglobin 8.2 (*)    HCT 25.8 (*)    RDW 16.7 (*)    All other components within normal limits  DIFFERENTIAL - Abnormal; Notable for the following:    Monocytes Relative 14 (*)    Monocytes Absolute 1.1 (*)    All other components within normal limits  COMPREHENSIVE METABOLIC PANEL - Abnormal; Notable for the following:    Sodium 133 (*)    Calcium 8.3 (*)    Albumin 1.7 (*)    GFR calc non Af Amer 55 (*)    GFR calc Af Amer 64 (*)    All other components within normal limits  URINALYSIS, ROUTINE W REFLEX MICROSCOPIC - Abnormal; Notable for the following:    Color, Urine RED (*) BIOCHEMICALS MAY BE AFFECTED BY COLOR   APPearance CLOUDY (*)    Hgb urine dipstick LARGE (*)    Protein, ur >300 (*)    Leukocytes, UA SMALL (*)    All other components within normal limits  URINE MICROSCOPIC-ADD ON - Abnormal; Notable for the following:    Casts HYALINE CASTS (*) GRANULAR CAST   All other components within normal limits  LACTIC ACID,  PLASMA  CULTURE, BLOOD (ROUTINE X 2)  CULTURE, BLOOD (ROUTINE X 2)  STOOL CULTURE  CLOSTRIDIUM DIFFICILE BY PCR  URINE CULTURE   No results found.   1. Colitis   2. Abdominal pain   3. Chronic anemia  MDM  Low grade elevated temperature on rectal exam.  Will need CT, check lactic acid and hemoccult.  Also send diarrhea for c diff and culture.  Will treat with IVF's and IV analgesics and continue to monitor.  Exam is nto highly suggestive of obstruction at this time.       12:01 AM Pt needed another dose of analgesics.  Spoke to Dr. Si Gaul with radiology who reports diffuse colitis.  He doesn't favor ischemic colitis, possibly C diff.  Pt did have a stool, but was not collected by staff.  Will admit for pain control and start PO flagyl or vancomycin pending [facility] consultation for admission.     12:17 AM Spoke to Dr. Joneen Roach who will see and admit.  Will start PO flagyl for suspected c diff colitis.    Gavin Pound. Oletta Lamas, MD 04/20/11 1610

## 2011-04-20 ENCOUNTER — Encounter (HOSPITAL_COMMUNITY): Payer: Self-pay | Admitting: Family Medicine

## 2011-04-20 DIAGNOSIS — Z8673 Personal history of transient ischemic attack (TIA), and cerebral infarction without residual deficits: Secondary | ICD-10-CM

## 2011-04-20 DIAGNOSIS — R197 Diarrhea, unspecified: Secondary | ICD-10-CM | POA: Diagnosis present

## 2011-04-20 LAB — PROTIME-INR: Prothrombin Time: 25.2 seconds — ABNORMAL HIGH (ref 11.6–15.2)

## 2011-04-20 LAB — URINE CULTURE
Colony Count: 50000
Culture  Setup Time: 201302262217

## 2011-04-20 LAB — RETICULOCYTES
RBC.: 2.34 MIL/uL — ABNORMAL LOW (ref 4.22–5.81)
Retic Count, Absolute: 53.8 10*3/uL (ref 19.0–186.0)
Retic Ct Pct: 2.3 % (ref 0.4–3.1)

## 2011-04-20 LAB — CBC
HCT: 21.1 % — ABNORMAL LOW (ref 39.0–52.0)
Hemoglobin: 6.7 g/dL — CL (ref 13.0–17.0)
MCH: 28.4 pg (ref 26.0–34.0)
MCHC: 31.8 g/dL (ref 30.0–36.0)
MCV: 89.4 fL (ref 78.0–100.0)
RDW: 16.8 % — ABNORMAL HIGH (ref 11.5–15.5)

## 2011-04-20 LAB — OCCULT BLOOD, POC DEVICE: Fecal Occult Bld: POSITIVE

## 2011-04-20 LAB — BASIC METABOLIC PANEL
BUN: 20 mg/dL (ref 6–23)
Chloride: 106 mEq/L (ref 96–112)
Creatinine, Ser: 1.32 mg/dL (ref 0.50–1.35)
Glucose, Bld: 87 mg/dL (ref 70–99)
Potassium: 4.1 mEq/L (ref 3.5–5.1)

## 2011-04-20 LAB — IRON AND TIBC
Saturation Ratios: 29 % (ref 20–55)
UIBC: 55 ug/dL — ABNORMAL LOW (ref 125–400)

## 2011-04-20 LAB — HEMOGLOBIN AND HEMATOCRIT, BLOOD
HCT: 26.8 % — ABNORMAL LOW (ref 39.0–52.0)
Hemoglobin: 8.9 g/dL — ABNORMAL LOW (ref 13.0–17.0)

## 2011-04-20 LAB — ABO/RH: ABO/RH(D): O POS

## 2011-04-20 LAB — FOLATE: Folate: >20 ng/mL

## 2011-04-20 LAB — VITAMIN B12: Vitamin B-12: 473 pg/mL (ref 211–911)

## 2011-04-20 MED ORDER — METOPROLOL TARTRATE 12.5 MG HALF TABLET
12.5000 mg | ORAL_TABLET | Freq: Two times a day (BID) | ORAL | Status: DC
  Administered 2011-04-20 – 2011-04-22 (×5): 12.5 mg via ORAL
  Filled 2011-04-20 (×6): qty 1

## 2011-04-20 MED ORDER — CYCLOBENZAPRINE HCL 5 MG PO TABS
5.0000 mg | ORAL_TABLET | Freq: Every day | ORAL | Status: DC
Start: 2011-04-20 — End: 2011-04-22
  Administered 2011-04-20 – 2011-04-22 (×3): 5 mg via ORAL
  Filled 2011-04-20 (×3): qty 1

## 2011-04-20 MED ORDER — SACCHAROMYCES BOULARDII 250 MG PO CAPS
250.0000 mg | ORAL_CAPSULE | Freq: Two times a day (BID) | ORAL | Status: DC
  Administered 2011-04-20 – 2011-04-22 (×5): 250 mg via ORAL
  Filled 2011-04-20 (×8): qty 1

## 2011-04-20 MED ORDER — SEVELAMER HCL 800 MG PO TABS
2400.0000 mg | ORAL_TABLET | Freq: Two times a day (BID) | ORAL | Status: DC
  Administered 2011-04-21 – 2011-04-22 (×3): 2400 mg via ORAL
  Filled 2011-04-20 (×2): qty 3

## 2011-04-20 MED ORDER — OXYCODONE HCL 5 MG PO TABS
5.0000 mg | ORAL_TABLET | ORAL | Status: DC | PRN
  Administered 2011-04-20: 10 mg via ORAL
  Filled 2011-04-20 (×2): qty 2

## 2011-04-20 MED ORDER — BIOTENE DRY MOUTH MT LIQD: 15.0000 mL | Freq: Two times a day (BID) | OROMUCOSAL | Status: DC

## 2011-04-20 MED ORDER — PROMETHAZINE HCL 25 MG PO TABS: 25.0000 mg | ORAL_TABLET | Freq: Four times a day (QID) | ORAL | Status: DC | PRN

## 2011-04-20 MED ORDER — LANSOPRAZOLE 30 MG PO TBDP: 30.0000 mg | ORAL_TABLET | Freq: Every day | ORAL | Status: DC

## 2011-04-20 MED ORDER — SEVELAMER CARBONATE 800 MG PO TABS
2400.0000 mg | ORAL_TABLET | Freq: Two times a day (BID) | ORAL | Status: DC
  Administered 2011-04-20: 2400 mg via ORAL
  Filled 2011-04-20 (×5): qty 3

## 2011-04-20 MED ORDER — FOLIC ACID 1 MG PO TABS
1.0000 mg | ORAL_TABLET | Freq: Every day | ORAL | Status: DC
  Administered 2011-04-20 – 2011-04-22 (×3): 1 mg via ORAL
  Filled 2011-04-20 (×3): qty 1

## 2011-04-20 MED ORDER — PHYTONADIONE 5 MG PO TABS
5.0000 mg | ORAL_TABLET | Freq: Once | ORAL | Status: AC
  Administered 2011-04-20: 5 mg via ORAL
  Filled 2011-04-20: qty 1

## 2011-04-20 MED ORDER — SODIUM CHLORIDE 0.9 % IJ SOLN: 3.0000 mL | INTRAMUSCULAR | Status: DC | PRN

## 2011-04-20 MED ORDER — MORPHINE SULFATE 2 MG/ML IJ SOLN
2.0000 mg | INTRAMUSCULAR | Status: DC | PRN
  Administered 2011-04-20: 2 mg via INTRAVENOUS
  Filled 2011-04-20: qty 1

## 2011-04-20 MED ORDER — METRONIDAZOLE 500 MG PO TABS
500.0000 mg | ORAL_TABLET | Freq: Three times a day (TID) | ORAL | Status: DC
  Administered 2011-04-20 – 2011-04-22 (×10): 500 mg via ORAL
  Filled 2011-04-20 (×11): qty 1

## 2011-04-20 MED ORDER — BIOTENE DRY MOUTH MT LIQD
15.0000 mL | Freq: Two times a day (BID) | OROMUCOSAL | Status: DC
  Administered 2011-04-20 – 2011-04-21 (×3): 15 mL via OROMUCOSAL

## 2011-04-20 MED ORDER — ACETAMINOPHEN 325 MG PO TABS: 650.0000 mg | ORAL_TABLET | ORAL | Status: DC | PRN

## 2011-04-20 MED ORDER — FENTANYL 25 MCG/HR TD PT72
25.0000 ug | MEDICATED_PATCH | TRANSDERMAL | Status: DC
  Administered 2011-04-20: 25 ug via TRANSDERMAL
  Filled 2011-04-20: qty 1

## 2011-04-20 MED ORDER — PANTOPRAZOLE SODIUM 40 MG PO TBEC
40.0000 mg | DELAYED_RELEASE_TABLET | Freq: Every day | ORAL | Status: DC
  Administered 2011-04-20 – 2011-04-22 (×3): 40 mg via ORAL
  Filled 2011-04-20 (×3): qty 1

## 2011-04-20 NOTE — ED Notes (Signed)
Pt care assumed, obtained verbal report. Pt reports abd pain rates it at 10/10.  Pt is screaming in pain.  States that the pain given to him did not last a long time.
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
## 2011-04-20 NOTE — Progress Notes (Signed)
Patient admitted earlier today with abdominal pain. Briefly seen and examined and chart reviewed, here with recurrent abdominal pain. CT scan raises concern for c diff. C. Diff PCR currently in process. Was apparently diagnosed with vasculitis recently at [facility]. It is imperative that we review records from there, which I have requested. He is extremely cachectic and malnourished. Will ask for a nutritionist eval. Had a Hb of 6.7. Has chronic anemia with a baseline Hb around 8. Was given 2 units PRBCs today. Stool guaiac is positive. Continue current management plan. Depending on course, info from [facility] and c diff results, may benefit from inpatient GI consultation.

## 2011-04-20 NOTE — ED Notes (Signed)
Pt hemoccult is positive.

## 2011-04-20 NOTE — Progress Notes (Signed)
UR complete 

## 2011-04-20 NOTE — Progress Notes (Signed)
ANTICOAGULATION CONSULT NOTE - Initial Consult  Pharmacy Consult for warfarin  Indication: hx of CVA  No Known Allergies  Patient Measurements:   Heparin Dosing Weight:   Vital Signs: Temp: 98.2 F (36.8 C) (02/27 0056) Temp src: Oral (02/27 0056) BP: 112/75 mmHg (02/27 0056) Pulse Rate: 98  (02/27 0056)  Labs:  Basename 04/20/11 0140 04/19/11 1900  HGB 6.7* 8.2*  HCT 21.1* 25.8*  PLT 305 282  APTT -- --  LABPROT -- --  INR -- --  HEPARINUNFRC -- --  CREATININE -- 1.33  CKTOTAL -- --  CKMB -- --  TROPONINI -- --   The CrCl is unknown because both a height and weight (above a minimum accepted value) are required for this calculation.  Medical History: Past Medical History  Diagnosis Date  . Hypertension   . CVA (cerebral infarction)   . Colitis 12/16/2010    Vasculitis    Medications:   (Not in a hospital admission)  Assessment: Patient with chronic warfarin for hx of CVA.  INR not resulted.  Home dose of warfarin known.  Goal of Therapy:  INR 2-3   Plan: follow up with INR and dose warfarin. Daily INR  Aleene Davidson Crowford 04/20/2011,2:03 AM

## 2011-04-20 NOTE — ED Notes (Signed)
Page placed for Admit MD

## 2011-04-20 NOTE — Progress Notes (Signed)
Repeat H&H was hemoglobin of 6.7. Will go ahead and guaiac stools. Transfuse 2 units packed red blood cells. INR therapeutic and will hold Coumadin. Low dose vitamin K given. There is no evidence of active bleeding. Patient's hemoglobin has been historically low. For this reason Coumadin has not been reversed. Serial H&H's ordered.

## 2011-04-20 NOTE — Progress Notes (Signed)
Signed authorization for release of medical information successfully faxed to [employer] [facility], [phone]. Spoke to [employer] at [employer] RN

## 2011-04-20 NOTE — H&P (Addendum)
PCP:   Josue Hector, MD, MD   Chief Complaint:  Abdominal pain and diarrhea  HPI: This is a 66 year old gentleman who was admitted at [facility] long leg 2012. He was diagnosed with vasculitis. His initial presentation was abdominal pain thought to be infective colitis. He eventually underwent a hemicolectomy with ileocolonic anastomosis, he had acute renal failure. He was eventually transferred to [facility]. Since his discharge, he has been doing well with some intermittent abdominal pain. Yesterday he developed severe abdominal pain and diarrhea. Pain located around the umbilicus. Reports fever, chills. Pain is intermittent, ranked 10/10. He was sent to the ER. In the ER he had a CT of the abdomen and pelvis done. Reading discuss this with ER physician and to radiologists, who believes infectious colitis is a possibility based on imaging. History provided by the patient.  Patient with very poor appetite. Approximately 40 pound weight loss since his hospitalization in August of 2012.  Review of Systems: Positives bolded   anorexia, fever, weight loss,, vision loss, decreased hearing, hoarseness, chest pain, syncope, dyspnea on exertion, peripheral edema, balance deficits, hemoptysis, abdominal pain, melena, hematochezia, severe indigestion/heartburn, hematuria, incontinence, genital sores, muscle weakness, suspicious skin lesions, transient blindness, difficulty walking, depression, unusual weight change, abnormal bleeding, enlarged lymph nodes, angioedema, and breast masses.  Past Medical History: Past Medical History  Diagnosis Date  . Hypertension   . CVA (cerebral infarction)   . Colitis 12/16/2010    Vasculitis   Past Surgical History  Procedure Date  . Back surgery 2002/2009  . Laparotomy 12/18/2010    Procedure: EXPLORATORY LAPAROTOMY;  Surgeon: Fabio Bering;  Location: [facility];  Service: General;  Laterality: N/A;  . Partial colectomy 12/18/2010    Procedure:  PARTIAL COLECTOMY;  Surgeon: Fabio Bering;  Location: [facility];  Service: General;  Laterality: N/A;    Medications: Prior to Admission medications   Medication Sig Start Date End Date Taking? Authorizing Provider  acetaminophen (TYLENOL) 325 MG tablet Take 650 mg by mouth every 4 (four) hours as needed. For temp > 101.   Yes Historical Provider, MD  amLODipine (NORVASC) 5 MG tablet Take 5 mg by mouth daily.   Yes Historical Provider, MD  cyclobenzaprine (FLEXERIL) 5 MG tablet Take 5 mg by mouth daily. 7 day course of therapy, ending on 04/23/11   Yes Historical Provider, MD  Emollient (EUCERIN EX) Apply 1 application topically daily. Applied bilaterally to legs and feet.   Yes Historical Provider, MD  fentaNYL (DURAGESIC - DOSED MCG/HR) 25 MCG/HR Place 1 patch onto the skin every 3 (three) days.   Yes Historical Provider, MD  folic acid (FOLVITE) 1 MG tablet Take 1 mg by mouth daily.   Yes Historical Provider, MD  lansoprazole (PREVACID SOLUTAB) 30 MG disintegrating tablet Take 30 mg by mouth daily.   Yes Historical Provider, MD  metoprolol tartrate (LOPRESSOR) 25 MG tablet Take 12.5 mg by mouth 2 (two) times daily.   Yes Historical Provider, MD  Multiple Vitamin (MULITIVITAMIN WITH MINERALS) TABS Take 1 tablet by mouth daily.   Yes Historical Provider, MD  oxyCODONE (OXY IR/ROXICODONE) 5 MG immediate release tablet Take 5-10 mg by mouth every 4 (four) hours as needed. For mild-severe pain.   Yes Historical Provider, MD  potassium chloride SA (K-DUR,KLOR-CON) 20 MEQ tablet Take 40 mEq by mouth daily.   Yes Historical Provider, MD  promethazine (PHENERGAN) 25 MG tablet Take 25 mg by mouth every 6 (six) hours as needed. For nausea/vomiting.  Yes Historical Provider, MD  saccharomyces boulardii (FLORASTOR) 250 MG capsule Take 250 mg by mouth 2 (two) times daily.   Yes Historical Provider, MD  sevelamer (RENVELA) 800 MG tablet Take 2,400 mg by mouth 2 (two) times daily.   Yes Historical Provider, MD    warfarin (COUMADIN) 2 MG tablet Take 2 mg by mouth daily. Given with 5mg  tablet to total 7mg  daily.   Yes Historical Provider, MD  warfarin (COUMADIN) 5 MG tablet Take 5 mg by mouth daily. Given with 2mg  tablet to total 7mg .   Yes Historical Provider, MD    Allergies:  No Known Allergies  Social History:  reports that he has been smoking.  He does not have any smokeless tobacco history on file. He reports that he drinks alcohol. He reports that he uses illicit drugs (Marijuana). uses a cane or walker  Family History: Family History  Problem Relation Age of Onset  . Colon cancer Neg Hx   . Liver disease Neg Hx     Physical Exam: Filed Vitals:   04/19/11 1745 04/19/11 1826 04/19/11 1927 04/20/11 0056  BP: 110/77  102/69 112/75  Pulse: 109  103 98  Temp: 98.7 F (37.1 C) 100.2 F (37.9 C) 98.5 F (36.9 C) 98.2 F (36.8 C)  TempSrc: Oral Rectal Oral Oral  Resp: 18  16 18   SpO2: 100%  98% 98%    General:  Alert and oriented times three, cachectic with bitemporal wasting, no acute distress, weak-appearing Eyes: PERRLA, pink conjunctiva, no scleral icterus ENT: Moist oral mucosa, neck supple, no thyromegaly, positive dental caries Lungs: clear to ascultation, no wheeze, no crackles, no use of accessory muscles Cardiovascular: regular rate and rhythm, no regurgitation, no gallops, no murmurs. No carotid bruits, no JVD Abdomen: soft, positive BS, generalized tenderness to palpation non-distended, no organomegaly, not an acute abdomen GU: not examined Neuro: CN II - XII grossly intact, sensation intact Musculoskeletal: strength 5/5 all extremities, no clubbing, cyanosis or edema Skin: no rash, no subcutaneous crepitation, no decubitus Psych: appropriate patient   Labs on Admission:   Basename 04/19/11 1900  NA 133*  K 4.0  CL 105  CO2 21  GLUCOSE 87  BUN 21  CREATININE 1.33  CALCIUM 8.3*  MG --  PHOS --    Basename 04/19/11 1900  AST 20  ALT 16  ALKPHOS 114   BILITOT 0.3  PROT 7.2  ALBUMIN 1.7*   No results found for this basename: LIPASE:2,AMYLASE:2 in the last 72 hours  Basename 04/19/11 1900  WBC 8.2  NEUTROABS 5.3  HGB 8.2*  HCT 25.8*  MCV 89.6  PLT 282   No results found for this basename: CKTOTAL:3,CKMB:3,CKMBINDEX:3,TROPONINI:3 in the last 72 hours No components found with this basename: POCBNP:3 No results found for this basename: DDIMER:2 in the last 72 hours No results found for this basename: HGBA1C:2 in the last 72 hours No results found for this basename: CHOL:2,HDL:2,LDLCALC:2,TRIG:2,CHOLHDL:2,LDLDIRECT:2 in the last 72 hours No results found for this basename: TSH,T4TOTAL,FREET3,T3FREE,THYROIDAB in the last 72 hours No results found for this basename: VITAMINB12:2,FOLATE:2,FERRITIN:2,TIBC:2,IRON:2,RETICCTPCT:2 in the last 72 hours  Micro Results: No results found for this or any previous visit (from the past 240 hour(s)). Results for GEARALD, STONEBRAKER (MRN [id number]) as of 04/20/2011 01:00  Ref. Range 04/19/2011 18:27  Color, Urine Latest Range: YELLOW  RED (A)  APPearance Latest Range: CLEAR  CLOUDY (A)  Specific Gravity, Urine Latest Range: 1.005-1.030  1.017  pH Latest Range: 5.0-8.0  6.0  Glucose, UA Latest Range: NEGATIVE mg/dL NEGATIVE  Bilirubin Urine Latest Range: NEGATIVE  NEGATIVE  Ketones, ur Latest Range: NEGATIVE mg/dL NEGATIVE  Protein Latest Range: NEGATIVE mg/dL >161 (A)  Urobilinogen, UA Latest Range: 0.0-1.0 mg/dL 0.2  Nitrite Latest Range: NEGATIVE  NEGATIVE  Leukocytes, UA Latest Range: NEGATIVE  SMALL (A)  Hgb urine dipstick Latest Range: NEGATIVE  LARGE (A)  WBC, UA Latest Range: <3 WBC/hpf 7-10  RBC / HPF Latest Range: <3 RBC/hpf 21-50  Squamous Epithelial / LPF Latest Range: RARE  RARE  Bacteria, UA Latest Range: RARE  RARE  Casts Latest Range: NEGATIVE  HYALINE CASTS (A)    Radiological Exams on Admission: Ct Abdomen Pelvis W Contrast  04/20/2011  *RADIOLOGY REPORT*  Clinical Data: Left  lower quadrant abdominal pain for 3 days.  CT ABDOMEN AND PELVIS WITH CONTRAST  Technique:  Multidetector CT imaging of the abdomen and pelvis was performed following the standard protocol during bolus administration of intravenous contrast.  Contrast:  100 mL of Omnipaque 300 IV contrast  Comparison: CT of the abdomen and pelvis performed 12/22/2010  Findings: Minimal bibasilar atelectasis is noted.  There is diffuse periportal edema; this may reflect volume repletion or the patient's underlying soft tissue edema.  A vague 1.2 cm hypodensity within the right hepatic dome is nonspecific but may reflect a small cyst.  The spleen is unremarkable in appearance.  The pancreas and adrenal glands are within normal limits.  Trace free fluid is noted about the liver and spleen.  Nonspecific perinephric stranding is noted bilaterally.  The kidneys are otherwise unremarkable in appearance.  There is no evidence of hydronephrosis.  No renal or ureteral stones are seen.  The small bowel is grossly unremarkable, though trace fluid and edema tracks about small bowel loops, likely reflecting the colonic process.  Mild soft tissue edema is noted about the stomach; the stomach is otherwise grossly unremarkable in appearance.  No acute vascular abnormalities are seen.  There is scattered calcification along the abdominal aorta and its branches; the superior and inferior mesenteric arteries remain patent.  The patient is status post hemicolectomy; there is significant diffuse soft tissue edema and wall thickening along the entirety of the remaining colon, with associated free fluid, compatible with acute colitis.  The appearance is most suspicious for infectious colitis, and raises concern for C. difficile.  The associated vasculature appears grossly intact, without definite evidence for ischemic colitis.  The bladder is largely decompressed, with mass effect from the overlying sigmoid colon.  The prostate remains normal in size, with  scattered calcification.  No inguinal lymphadenopathy is seen.  No acute osseous abnormalities are identified.  There is loss of the intervertebral disc space at L5-S1, with question of associated osseous fusion.  IMPRESSION:  1.  Significant diffuse soft tissue edema and wall thickening along the entirety of the remaining colon, with associated free fluid, compatible with acute colitis.  The appearance is no suspicious for infectious colitis, raising concern for C. difficile.  No definite evidence to suggest ischemic colitis. 2.  Mesenteric edema noted within the abdomen; diffuse periportal edema may reflect volume repletion or the underlying systemic process. 3.  Trace free fluid noted about the liver and spleen. 4.  Likely hepatic cyst noted. 5.  Scattered calcification along the abdominal aorta and its branches; the superior and inferior mesenteric arteries remain patent. 6.  Loss of the intervertebral disc space at L5-S1, with question of associated osseous fusion.  These results were called by  telephone on 04/19/2011  at  11:58 p.m. to  Dr. Quita Skye, who verbally acknowledged these results.  Original Report Authenticated By: Tonia Ghent, M.D.    Assessment/Plan Present on Admission:  .Colitis .Diarrhea .Vasculitis Admit patient to MedSurg  C. difficile toxin is ordered as well as Flagyl IV fluid hydrations Liquid diet Will defer to a.m. team consult for GI. Patient with recent similar presentation diagnosis of vasculitis. Could this be or recurrence? However, will have to rule out infectious etiology such as C. difficile  Obtain records from [facility]. Protein calorie malnutrition Prealbumin level and some nutrition consult requested History of CVA Continue Coumadin pharmacy to dose Anemia Chronic and unchanged, and he panel ordered Typed and screened   Full code Coumadin for DVT prophylaxis T1/Dr. Loura Pardon, Journey Ratterman 04/20/2011, 1:09 AM

## 2011-04-21 LAB — TYPE AND SCREEN
Antibody Screen: NEGATIVE
Unit division: 0

## 2011-04-21 LAB — PROTIME-INR
INR: 2.13 — ABNORMAL HIGH (ref 0.00–1.49)
Prothrombin Time: 24.2 seconds — ABNORMAL HIGH (ref 11.6–15.2)

## 2011-04-21 LAB — CBC
MCH: 29 pg (ref 26.0–34.0)
MCHC: 33.1 g/dL (ref 30.0–36.0)
Platelets: 321 10*3/uL (ref 150–400)
RDW: 16.5 % — ABNORMAL HIGH (ref 11.5–15.5)

## 2011-04-21 LAB — BASIC METABOLIC PANEL
BUN: 16 mg/dL (ref 6–23)
Calcium: 8.3 mg/dL — ABNORMAL LOW (ref 8.4–10.5)
GFR calc Af Amer: 65 mL/min — ABNORMAL LOW (ref >90)
GFR calc non Af Amer: 56 mL/min — ABNORMAL LOW (ref >90)
Glucose, Bld: 77 mg/dL (ref 70–99)

## 2011-04-21 LAB — CLOSTRIDIUM DIFFICILE BY PCR: Toxigenic C. Difficile by PCR: POSITIVE — AB

## 2011-04-21 NOTE — Progress Notes (Signed)
Subjective: Still with diarrhea. Cachectic, Wants double food portions. Records from [facility] reviewed: rheum and dermatology agreed vasculitis was not an issue, had ARF requiring a few dialysis sessions after being in septic shock from presumed infected fluid collections in the abdomen +/- PNA; was discharged to [facility] and apparently later on to [facility].  Objective: Vital signs in last 24 hours: Temp:  [98.5 F (36.9 C)-99.1 F (37.3 C)] 98.5 F (36.9 C) (02/28 0630) Pulse Rate:  [83-97] 83  (02/28 0630) Resp:  [14-18] 18  (02/28 0630) BP: (105-132)/(71-91) 127/85 mmHg (02/28 0630) SpO2:  [95 %-99 %] 95 % (02/28 0630) Weight:  [52.164 kg (115 lb)] 52.164 kg (115 lb) (02/27 1256) Weight change: 0 kg (0 lb) Last BM Date: 04/20/11  Intake/Output from previous day: 02/27 0701 - 02/28 0700 In: 120 [P.O.:120] Out: 900 [Urine:900]     Physical Exam: General: Alert, awake, oriented x3, in no acute distress. HEENT: No bruits, no goiter. Heart: Regular rate and rhythm, without murmurs, rubs, gallops. Lungs: Clear to auscultation bilaterally. Abdomen: Soft, nontender, nondistended, positive bowel sounds. Extremities: No clubbing cyanosis or edema with positive pedal pulses. Neuro: Grossly intact, nonfocal.    Lab Results: Basic Metabolic Panel:  Basename 04/21/11 0335 04/20/11 0140  NA 132* 132*  K 3.5 4.1  CL 104 106  CO2 22 22  GLUCOSE 77 87  BUN 16 20  CREATININE 1.32 1.32  CALCIUM 8.3* 8.7  MG -- --  PHOS -- --   Liver Function Tests:  Basename 04/19/11 1900  AST 20  ALT 16  ALKPHOS 114  BILITOT 0.3  PROT 7.2  ALBUMIN 1.7*   CBC:  Basename 04/21/11 0335 04/20/11 1346 04/20/11 0140 04/19/11 1900  WBC 6.4 -- 8.9 --  NEUTROABS -- -- -- 5.3  HGB 8.6* 8.9* -- --  HCT 26.0* 26.8* -- --  MCV 87.5 -- 89.4 --  PLT 321 -- 305 --   Anemia Panel:  Basename 04/20/11 0140  VITAMINB12 473  FOLATE >20.0  FERRITIN 1910*  TIBC 77*  IRON 22*  RETICCTPCT 2.3    Coagulation:  Basename 04/21/11 0335 04/20/11 0140  LABPROT 24.2* 25.2*  INR 2.13* 2.24*   Urinalysis:  Basename 04/19/11 1827  COLORURINE RED*  LABSPEC 1.017  PHURINE 6.0  GLUCOSEU NEGATIVE  HGBUR LARGE*  BILIRUBINUR NEGATIVE  KETONESUR NEGATIVE  PROTEINUR >300*  UROBILINOGEN 0.2  NITRITE NEGATIVE  LEUKOCYTESUR SMALL*    Recent Results (from the past 240 hour(s))  URINE CULTURE     Status: Normal   Collection Time   04/19/11  6:27 PM      Component Value Range Status Comment   Specimen Description URINE, CLEAN CATCH   Final    Special Requests NONE   Final    Culture  Setup Time [id number]   Final    Colony Count 50,000 COLONIES/ML   Final    Culture     Final    Value: Multiple bacterial morphotypes present, none predominant. Suggest appropriate recollection if clinically indicated.   Report Status 04/20/2011 FINAL   Final   CULTURE, BLOOD (ROUTINE X 2)     Status: Normal (Preliminary result)   Collection Time   04/19/11  7:00 PM      Component Value Range Status Comment   Specimen Description BLOOD RIGHT ARM   Final    Special Requests BOTTLES DRAWN AEROBIC AND ANAEROBIC [facility]   Final    Culture  Setup Time [id number]   Final  Culture     Final    Value: GRAM POSITIVE COCCOBACILLI     Note: Gram Stain Report Called to,Read Back By and Verified With: [facility] SIDDIQI 04/21/11 10:25 BY GARRS   Report Status PENDING   Incomplete   CULTURE, BLOOD (ROUTINE X 2)     Status: Normal (Preliminary result)   Collection Time   04/19/11  7:05 PM      Component Value Range Status Comment   Specimen Description BLOOD RIGHT ARM   Final    Special Requests BOTTLES DRAWN AEROBIC ONLY 3CC   Final    Culture  Setup Time [id number]   Final    Culture     Final    Value:        BLOOD CULTURE RECEIVED NO GROWTH TO DATE CULTURE WILL BE HELD FOR 5 DAYS BEFORE ISSUING A FINAL NEGATIVE REPORT   Report Status PENDING   Incomplete   STOOL CULTURE     Status: Normal (Preliminary  result)   Collection Time   04/20/11 11:06 AM      Component Value Range Status Comment   Specimen Description PERIRECTAL   Final    Special Requests NONE   Final    Culture Culture reincubated for better growth   Final    Report Status PENDING   Incomplete   CLOSTRIDIUM DIFFICILE BY PCR     Status: Abnormal   Collection Time   04/20/11 11:06 AM      Component Value Range Status Comment   C difficile by pcr POSITIVE (*) NEGATIVE  Final     Studies/Results: Ct Abdomen Pelvis W Contrast  04/20/2011  *RADIOLOGY REPORT*  Clinical Data: Left lower quadrant abdominal pain for 3 days.  CT ABDOMEN AND PELVIS WITH CONTRAST  Technique:  Multidetector CT imaging of the abdomen and pelvis was performed following the standard protocol during bolus administration of intravenous contrast.  Contrast:  100 mL of Omnipaque 300 IV contrast  Comparison: CT of the abdomen and pelvis performed 12/22/2010  Findings: Minimal bibasilar atelectasis is noted.  There is diffuse periportal edema; this may reflect volume repletion or the patient's underlying soft tissue edema.  A vague 1.2 cm hypodensity within the right hepatic dome is nonspecific but may reflect a small cyst.  The spleen is unremarkable in appearance.  The pancreas and adrenal glands are within normal limits.  Trace free fluid is noted about the liver and spleen.  Nonspecific perinephric stranding is noted bilaterally.  The kidneys are otherwise unremarkable in appearance.  There is no evidence of hydronephrosis.  No renal or ureteral stones are seen.  The small bowel is grossly unremarkable, though trace fluid and edema tracks about small bowel loops, likely reflecting the colonic process.  Mild soft tissue edema is noted about the stomach; the stomach is otherwise grossly unremarkable in appearance.  No acute vascular abnormalities are seen.  There is scattered calcification along the abdominal aorta and its branches; the superior and inferior mesenteric  arteries remain patent.  The patient is status post hemicolectomy; there is significant diffuse soft tissue edema and wall thickening along the entirety of the remaining colon, with associated free fluid, compatible with acute colitis.  The appearance is most suspicious for infectious colitis, and raises concern for C. difficile.  The associated vasculature appears grossly intact, without definite evidence for ischemic colitis.  The bladder is largely decompressed, with mass effect from the overlying sigmoid colon.  The prostate remains normal in size, with scattered  calcification.  No inguinal lymphadenopathy is seen.  No acute osseous abnormalities are identified.  There is loss of the intervertebral disc space at L5-S1, with question of associated osseous fusion.  IMPRESSION:  1.  Significant diffuse soft tissue edema and wall thickening along the entirety of the remaining colon, with associated free fluid, compatible with acute colitis.  The appearance is no suspicious for infectious colitis, raising concern for C. difficile.  No definite evidence to suggest ischemic colitis. 2.  Mesenteric edema noted within the abdomen; diffuse periportal edema may reflect volume repletion or the underlying systemic process. 3.  Trace free fluid noted about the liver and spleen. 4.  Likely hepatic cyst noted. 5.  Scattered calcification along the abdominal aorta and its branches; the superior and inferior mesenteric arteries remain patent. 6.  Loss of the intervertebral disc space at L5-S1, with question of associated osseous fusion.  These results were called by telephone on 04/19/2011  at  11:58 p.m. to  Dr. Quita Skye, who verbally acknowledged these results.  Original Report Authenticated By: Tonia Ghent, M.D.    Medications: Scheduled Meds:   . antiseptic oral rinse  15 mL Mouth Rinse q12n4p  . cyclobenzaprine  5 mg Oral Daily  . fentaNYL  25 mcg Transdermal Q72H  . folic acid  1 mg Oral Daily  . metoprolol  tartrate  12.5 mg Oral BID  . metroNIDAZOLE  500 mg Oral Q8H  . pantoprazole  40 mg Oral Q1200  . saccharomyces boulardii  250 mg Oral BID  . sevelamer  2,400 mg Oral BID WC  . DISCONTD: antiseptic oral rinse  15 mL Mouth Rinse BID  . DISCONTD: sevelamer  2,400 mg Oral BID   Continuous Infusions:  PRN Meds:.acetaminophen, morphine, oxyCODONE, promethazine, sodium chloride  Assessment/Plan:  Active Problems:  Colitis  Vasculitis  Diarrhea  H/O: CVA (cardiovascular accident)   #1 C Diff colitis: First episode. Continue flagyl/florastor. Advance diet.  #2 Protein-Caloric Malnutrition: Appreciate nutrition recs. Will order double portions of food.  #3 CKD Stage III: Stable. Cr at baseline.  #4 AOCD: From prolonged illness. Received 2 units of PRBCs yesterday for a Hb of 6.7. No further transfusions planned at this point.  #5 Dispo: As long as diarrhea is tolerable, may be able to DC in am. Will need to clarify exactly what his living arrangements are. Will ask for a PT eval as he appears chronically weak and cachectic.   LOS: 2 days   [facility] Pager: [phone] 04/21/2011, 10:27 AM

## 2011-04-21 NOTE — Clinical Documentation Improvement (Signed)
MALNUTRITION DOCUMENTATION CLARIFICATION  THIS DOCUMENT IS NOT A PERMANENT PART OF THE MEDICAL RECORD  TO RESPOND TO THE THIS QUERY, FOLLOW THE INSTRUCTIONS BELOW:  1. If needed, update documentation for the patient's encounter via the notes activity.  2. Access this query again and click edit on the In [employer].  3. After updating, or not, click F2 to complete all highlighted (required) fields concerning your review. Select "additional documentation in the medical record" OR "no additional documentation provided".  4. Click Sign note button.  5. The deficiency will fall out of your In Basket *Please let us know if you are not able to complete this workflow by phone or e-mail (listed below).  Please update your documentation within the medical record to reflect your response to this query.                                                                                        04/21/11   Dear Dr. [facility],  In a better effort to capture your patient's severity of illness, reflect appropriate length of stay and utilization of resources, a review of the patient medical record has revealed the following indicators.    Based on your clinical judgment, please clarify and document in a progress note and/or discharge summary the clinical condition associated with the following supporting information:  In responding to this query please exercise your independent judgment.  The fact that a query is asked, does not imply that any particular answer is desired or expected.  04/21/11 Nutr Note..."-Pt meets criteria for severe PCM of chronic illness AEB 36% weight loss in the past 4 months and severe muscle and fat loss as pt with cachetic appearance." For accurate DX specificity & severity please help further clarify/validate Nutr Assessment. Thank you  Possible Clinical Conditions?  -Mild Malnutrition  -Moderate Malnutrition -Severe Malnutrition    -Protein Calorie  Malnutrition -Severe Protein Calorie Malnutrition  -Emaciation  -Cachexia    -Other Condition________________ -Cannot clinically determine  Supporting Information: Risk Factors: see Nutrition Assessemnt 04/21/11  Signs & Symptoms: see Nutrition Assessemnt 04/21/11  Diagnostics: see Nutrititon Assessment 04/21/11  Treatments: see Nutrition Assessment 04/21/11  Nutrition Consult: 04/21/11   You may use possible, probable, or suspect with inpatient documentation. possible, probable, suspected diagnoses MUST be documented at the time of discharge  Reviewed:  no additional documentation provided:04/25/11 rev'd, pt dc'd 04/22/11>no add doc. orm  Thank You,  Toribio Harbour, RN, BSN, CCDS Certified Clinical Documentation Specialist Pager: [phone]  Health Information Management [place]

## 2011-04-21 NOTE — Progress Notes (Signed)
PT Cancellation Note  Treatment cancelled today due to patient's refusal to participate.  Brian [name] 04/21/2011, 3:39 PM

## 2011-04-21 NOTE — Progress Notes (Signed)
INITIAL ADULT NUTRITION ASSESSMENT Date: 04/21/2011   Time: 10:49 AM Reason for Assessment: Consult  ASSESSMENT: Male 66 y.o.  Dx: Abdominal pain, diarrhea  Hx:  Past Medical History  Diagnosis Date  . Hypertension   . CVA (cerebral infarction)   . Colitis 12/16/2010    Vasculitis   Related Meds: Scheduled Meds:   . antiseptic oral rinse  15 mL Mouth Rinse q12n4p  . cyclobenzaprine  5 mg Oral Daily  . fentaNYL  25 mcg Transdermal Q72H  . folic acid  1 mg Oral Daily  . metoprolol tartrate  12.5 mg Oral BID  . metroNIDAZOLE  500 mg Oral Q8H  . pantoprazole  40 mg Oral Q1200  . saccharomyces boulardii  250 mg Oral BID  . sevelamer  2,400 mg Oral BID WC  . DISCONTD: antiseptic oral rinse  15 mL Mouth Rinse BID  . DISCONTD: sevelamer  2,400 mg Oral BID   Continuous Infusions:  PRN Meds:.acetaminophen, morphine, oxyCODONE, promethazine, sodium chloride  Ht: 5\' 7"  (170.2 cm)  Wt: 115 lb (52.164 kg)  Ideal Wt: 67.3kg % Ideal Wt: 77  Usual Wt: 81.9kg in October 2012 % Usual Wt: 64  Body mass index is 18.01 kg/(m^2).  Food/Nutrition Related Hx: Pt admitted with abdominal pain for 3 days with watery diarrhea. Pt states this diarrhea as not as bad as episode he had 5 weeks ago which "wiped him out" similar to the time he was on dialysis at [facility] for a few times. Pt found to have C. Difficile. Pt from [facility], reports eating very well there, getting double portions, and no problem with appetite. Noted pt has lost 65 pounds unintentionally since October 2012 and was seen last year at [facility] and received TNA at that time, which he is no longer on. Pt states he was not on any nutritional supplements PTA and does not want any while he is here because he eats well.   CT of abdomen/pelvis on 2/26 showed: 1. Significant diffuse soft tissue edema and wall thickening along  the entirety of the remaining colon, with associated free fluid,  compatible with acute  colitis. The appearance is no suspicious for  infectious colitis, raising concern for C. difficile. No definite  evidence to suggest ischemic colitis.  2. Mesenteric edema noted within the abdomen; diffuse periportal  edema may reflect volume repletion or the underlying systemic  process.  3. Trace free fluid noted about the liver and spleen.  4. Likely hepatic cyst noted.  5. Scattered calcification along the abdominal aorta and its  branches; the superior and inferior mesenteric arteries remain  patent.  6. Loss of the intervertebral disc space at L5-S1, with question  of associated osseous fusion.  Labs:  CMP     Component Value Date/Time   NA 132* 04/21/2011 0335   K 3.5 04/21/2011 0335   CL 104 04/21/2011 0335   CO2 22 04/21/2011 0335   GLUCOSE 77 04/21/2011 0335   BUN 16 04/21/2011 0335   CREATININE 1.32 04/21/2011 0335   CALCIUM 8.3* 04/21/2011 0335   PROT 7.2 04/19/2011 1900   ALBUMIN 1.7* 04/19/2011 1900   AST 20 04/19/2011 1900   ALT 16 04/19/2011 1900   ALKPHOS 114 04/19/2011 1900   BILITOT 0.3 04/19/2011 1900   GFRNONAA 56* 04/21/2011 0335   GFRAA 65* 04/21/2011 0335    Intake/Output Summary (Last 24 hours) at 04/21/11 1057 Last data filed at 04/21/11 1610  Gross per 24 hour  Intake  120 ml  Output    775 ml  Net   -655 ml   Last BM - 04/20/11 - diarrhea     Diet Order: General   IVF:    Estimated Nutritional Needs:   Kcal:1800-2100 Protein:80-95g Fluid:1.8-2.1L  NUTRITION DIAGNOSIS: -Increased nutrient needs (NI-5.1).  Status: Ongoing -Pt meets criteria for severe PCM of chronic illness AEB 36% weight loss in the past 4 months and severe muscle and fat loss as pt with cachetic appearance   RELATED TO: significant weight loss PTA  AS EVIDENCE BY: BMI of 18  MONITORING/EVALUATION(Goals): Pt to consume >90% of meals.   EDUCATION NEEDS: -No education needs identified at this time  INTERVENTION: Assisted pt with ordering late breakfast as diet recently  advanced. Encouraged 100% meal intake and for pt to order meals/snacks frequently. Will monitor.   Dietitian #: [phone]  DOCUMENTATION CODES Per approved criteria  -Severe malnutrition in the context of chronic illness -Underweight    Marshall Cork 04/21/2011, 10:49 AM

## 2011-04-21 NOTE — Progress Notes (Signed)
04-21-11 Spoke with Brian [name] who states he is from [facility]. Awaiting therapy eval as well. Patient reports intentions on returning to [facility]. CSW aware.   [place], [place] [phone]

## 2011-04-22 LAB — PROTIME-INR: Prothrombin Time: 21.8 seconds — ABNORMAL HIGH (ref 11.6–15.2)

## 2011-04-22 LAB — CULTURE, BLOOD (ROUTINE X 2): Culture  Setup Time: 201302270142

## 2011-04-22 LAB — BASIC METABOLIC PANEL
BUN: 15 mg/dL (ref 6–23)
Calcium: 8 mg/dL — ABNORMAL LOW (ref 8.4–10.5)
Creatinine, Ser: 1.16 mg/dL (ref 0.50–1.35)
GFR calc Af Amer: 76 mL/min — ABNORMAL LOW (ref >90)
GFR calc non Af Amer: 65 mL/min — ABNORMAL LOW (ref >90)

## 2011-04-22 LAB — CBC
HCT: 28 % — ABNORMAL LOW (ref 39.0–52.0)
MCHC: 32.9 g/dL (ref 30.0–36.0)
Platelets: 319 10*3/uL (ref 150–400)
RDW: 16.6 % — ABNORMAL HIGH (ref 11.5–15.5)

## 2011-04-22 MED ORDER — METRONIDAZOLE 500 MG PO TABS: 500.0000 mg | ORAL_TABLET | Freq: Three times a day (TID) | ORAL | Status: AC

## 2011-04-22 NOTE — Discharge Summary (Addendum)
Physician Discharge Summary  Patient ID: Brian [name] MRN: [id number] DOB/AGE: [id number] 64 y.o.  Admit date: 04/19/2011 Discharge date: 04/22/2011  Primary Care Physician:  Josue Hector, MD, MD   Discharge Diagnoses:    Active Problems:  Colitis, c. diff  Vasculitis  Diarrhea  H/O: CVA (cardiovascular accident)    Medication List  As of 04/22/2011 10:05 AM   TAKE these medications         acetaminophen 325 MG tablet   Commonly known as: TYLENOL   Take 650 mg by mouth every 4 (four) hours as needed. For temp > 101.      amLODipine 5 MG tablet   Commonly known as: NORVASC   Take 5 mg by mouth daily.      cyclobenzaprine 5 MG tablet   Commonly known as: FLEXERIL   Take 5 mg by mouth daily. 7 day course of therapy, ending on 04/23/11      EUCERIN EX   Apply 1 application topically daily. Applied bilaterally to legs and feet.      fentaNYL 25 MCG/HR   Commonly known as: DURAGESIC - dosed mcg/hr   Place 1 patch onto the skin every 3 (three) days.      folic acid 1 MG tablet   Commonly known as: FOLVITE   Take 1 mg by mouth daily.      lansoprazole 30 MG disintegrating tablet   Commonly known as: PREVACID SOLUTAB   Take 30 mg by mouth daily.      metoprolol tartrate 25 MG tablet   Commonly known as: LOPRESSOR   Take 12.5 mg by mouth 2 (two) times daily.      metroNIDAZOLE 500 MG tablet   Commonly known as: FLAGYL   Take 1 tablet (500 mg total) by mouth every 8 (eight) hours.      mulitivitamin with minerals Tabs   Take 1 tablet by mouth daily.      oxyCODONE 5 MG immediate release tablet   Commonly known as: Oxy IR/ROXICODONE   Take 5-10 mg by mouth every 4 (four) hours as needed. For mild-severe pain.      potassium chloride SA 20 MEQ tablet   Commonly known as: K-DUR,KLOR-CON   Take 40 mEq by mouth daily.      promethazine 25 MG tablet   Commonly known as: PHENERGAN   Take 25 mg by mouth every 6 (six) hours as needed. For nausea/vomiting.      saccharomyces boulardii 250 MG capsule   Commonly known as: FLORASTOR   Take 250 mg by mouth 2 (two) times daily.      sevelamer 800 MG tablet   Commonly known as: RENVELA   Take 2,400 mg by mouth 2 (two) times daily.      warfarin 2 MG tablet   Commonly known as: COUMADIN   Take 2 mg by mouth daily. Given with 5mg  tablet to total 7mg  daily.      warfarin 5 MG tablet   Commonly known as: COUMADIN   Take 5 mg by mouth daily. Given with 2mg  tablet to total 7mg .             Disposition and Follow-up:  To be discharged back to SNF today in stable and improved condition. Will need to be on flagyl for 14 days.  Consults:  None    Significant Diagnostic Studies:  Ct Abdomen Pelvis W Contrast  04/20/2011  *RADIOLOGY REPORT*  Clinical Data: Left lower quadrant abdominal pain for 3  days.  CT ABDOMEN AND PELVIS WITH CONTRAST  Technique:  Multidetector CT imaging of the abdomen and pelvis was performed following the standard protocol during bolus administration of intravenous contrast.  Contrast:  100 mL of Omnipaque 300 IV contrast  Comparison: CT of the abdomen and pelvis performed 12/22/2010  Findings: Minimal bibasilar atelectasis is noted.  There is diffuse periportal edema; this may reflect volume repletion or the patient's underlying soft tissue edema.  A vague 1.2 cm hypodensity within the right hepatic dome is nonspecific but may reflect a small cyst.  The spleen is unremarkable in appearance.  The pancreas and adrenal glands are within normal limits.  Trace free fluid is noted about the liver and spleen.  Nonspecific perinephric stranding is noted bilaterally.  The kidneys are otherwise unremarkable in appearance.  There is no evidence of hydronephrosis.  No renal or ureteral stones are seen.  The small bowel is grossly unremarkable, though trace fluid and edema tracks about small bowel loops, likely reflecting the colonic process.  Mild soft tissue edema is noted about the stomach; the  stomach is otherwise grossly unremarkable in appearance.  No acute vascular abnormalities are seen.  There is scattered calcification along the abdominal aorta and its branches; the superior and inferior mesenteric arteries remain patent.  The patient is status post hemicolectomy; there is significant diffuse soft tissue edema and wall thickening along the entirety of the remaining colon, with associated free fluid, compatible with acute colitis.  The appearance is most suspicious for infectious colitis, and raises concern for C. difficile.  The associated vasculature appears grossly intact, without definite evidence for ischemic colitis.  The bladder is largely decompressed, with mass effect from the overlying sigmoid colon.  The prostate remains normal in size, with scattered calcification.  No inguinal lymphadenopathy is seen.  No acute osseous abnormalities are identified.  There is loss of the intervertebral disc space at L5-S1, with question of associated osseous fusion.  IMPRESSION:  1.  Significant diffuse soft tissue edema and wall thickening along the entirety of the remaining colon, with associated free fluid, compatible with acute colitis.  The appearance is no suspicious for infectious colitis, raising concern for C. difficile.  No definite evidence to suggest ischemic colitis. 2.  Mesenteric edema noted within the abdomen; diffuse periportal edema may reflect volume repletion or the underlying systemic process. 3.  Trace free fluid noted about the liver and spleen. 4.  Likely hepatic cyst noted. 5.  Scattered calcification along the abdominal aorta and its branches; the superior and inferior mesenteric arteries remain patent. 6.  Loss of the intervertebral disc space at L5-S1, with question of associated osseous fusion.  These results were called by telephone on 04/19/2011  at  11:58 p.m. to  Dr. Quita Skye, who verbally acknowledged these results.  Original Report Authenticated By: Tonia Ghent,  M.D.    Brief H and P: For complete details please refer to admission H and P, but in brief this is a 65 year old gentleman who was admitted at [facility] in 2012. He was diagnosed with possible vasculitis. His initial presentation was abdominal pain thought to be infective colitis. He eventually underwent a hemicolectomy with ileocolonic anastomosis, he had acute renal failure. He was eventually transferred to [facility]. Since his discharge, he has been doing well with some intermittent abdominal pain. Day prior to admission he developed severe abdominal pain and diarrhea. Pain located around the umbilicus. Reports fever, chills. Because of these issues we were asked to  admit him for further evaluation and management.     Hospital Course:  Active Problems:  Colitis, c. diff  Vasculitis  Diarrhea  H/O: CVA (cardiovascular accident)   #1 C Diff Colitis: This is the reason for his symptoms on presentation. Diarrhea has improved significantly. Will need to be on flagyl for a total of 14 days. Records from [place] reviewed, and he never had vasculitis.  #2 1/2 BC with gram positive coccobacilli: Discussed with ID. Patient is non-toxic appearing, afebrile. Likely contaminant. Will not treat.  All rest of chronic medical issues are stable and his home medications have not been changed.  Time spent on Discharge: Greater than 30 minutes  Signed: Chaya Jan [facility] Pager: [phone] 04/22/2011, 10:05 AM

## 2011-04-22 NOTE — Progress Notes (Signed)
PT Cancellation Note  Treatment cancelled today due to pt asleep.  Note that patient is to be d/c'd back to SNF today.  Ebony Hail [facility] 04/22/2011, 11:34 AM

## 2011-04-22 NOTE — Progress Notes (Signed)
Patient set to discharge back to [facility] SNF today. Daughter, Patsy Lager made aware. [facility] called for transport.     Unice Bailey, LCSWA [phone]

## 2011-04-22 NOTE — Evaluation (Signed)
OT Cancellation Note  Treatment cancelled today as per chart review, pt is being d/c'd back to SNF today. Recommend OT Assessment in SNF setting.   Roselie Awkward Dixon 04/22/2011, 12:15 PM

## 2011-04-23 LAB — STOOL CULTURE

## 2011-04-26 LAB — CULTURE, BLOOD (ROUTINE X 2)
Culture  Setup Time: 201302270142
Culture: NO GROWTH

## 2011-05-18 ENCOUNTER — Other Ambulatory Visit (HOSPITAL_BASED_OUTPATIENT_CLINIC_OR_DEPARTMENT_OTHER): Payer: Self-pay | Admitting: Internal Medicine

## 2011-05-18 DIAGNOSIS — N200 Calculus of kidney: Secondary | ICD-10-CM

## 2011-05-19 ENCOUNTER — Other Ambulatory Visit: Payer: Self-pay | Admitting: Internal Medicine

## 2011-05-19 ENCOUNTER — Ambulatory Visit (HOSPITAL_COMMUNITY)
Admission: RE | Admit: 2011-05-19 | Discharge: 2011-05-19 | Disposition: A | Discharge status: Home / Self Care | Payer: Medicare Other | Source: Ambulatory Visit | Attending: Internal Medicine | Admitting: Internal Medicine

## 2011-05-19 DIAGNOSIS — N200 Calculus of kidney: Secondary | ICD-10-CM

## 2011-05-20 ENCOUNTER — Other Ambulatory Visit: Payer: Self-pay

## 2011-05-20 ENCOUNTER — Ambulatory Visit (HOSPITAL_COMMUNITY)
Admission: RE | Admit: 2011-05-20 | Discharge: 2011-05-20 | Disposition: A | Discharge status: Home / Self Care | Payer: Medicare Other | Source: Ambulatory Visit | Attending: Internal Medicine | Admitting: Internal Medicine

## 2011-05-20 ENCOUNTER — Emergency Department (HOSPITAL_COMMUNITY): Payer: Medicare Other

## 2011-05-20 ENCOUNTER — Inpatient Hospital Stay (HOSPITAL_COMMUNITY)
Admission: EM | Admit: 2011-05-20 | Discharge: 2011-05-24 | DRG: 372 | Disposition: A | Discharge status: Skilled Nursing Facility | Payer: Medicare Other | Attending: Internal Medicine | Admitting: Internal Medicine

## 2011-05-20 ENCOUNTER — Encounter (HOSPITAL_COMMUNITY): Payer: Self-pay | Admitting: *Deleted

## 2011-05-20 DIAGNOSIS — L8992 Pressure ulcer of unspecified site, stage 2: Secondary | ICD-10-CM | POA: Diagnosis present

## 2011-05-20 DIAGNOSIS — Z7901 Long term (current) use of anticoagulants: Secondary | ICD-10-CM

## 2011-05-20 DIAGNOSIS — E46 Unspecified protein-calorie malnutrition: Secondary | ICD-10-CM | POA: Diagnosis present

## 2011-05-20 DIAGNOSIS — L89109 Pressure ulcer of unspecified part of back, unspecified stage: Secondary | ICD-10-CM | POA: Diagnosis present

## 2011-05-20 DIAGNOSIS — T45515A Adverse effect of anticoagulants, initial encounter: Secondary | ICD-10-CM | POA: Diagnosis present

## 2011-05-20 DIAGNOSIS — Z8673 Personal history of transient ischemic attack (TIA), and cerebral infarction without residual deficits: Secondary | ICD-10-CM

## 2011-05-20 DIAGNOSIS — R197 Diarrhea, unspecified: Secondary | ICD-10-CM | POA: Diagnosis present

## 2011-05-20 DIAGNOSIS — IMO0002 Reserved for concepts with insufficient information to code with codable children: Secondary | ICD-10-CM

## 2011-05-20 DIAGNOSIS — R109 Unspecified abdominal pain: Secondary | ICD-10-CM

## 2011-05-20 DIAGNOSIS — R932 Abnormal findings on diagnostic imaging of liver and biliary tract: Secondary | ICD-10-CM | POA: Diagnosis present

## 2011-05-20 DIAGNOSIS — K529 Noninfective gastroenteritis and colitis, unspecified: Secondary | ICD-10-CM

## 2011-05-20 DIAGNOSIS — R791 Abnormal coagulation profile: Secondary | ICD-10-CM | POA: Diagnosis present

## 2011-05-20 DIAGNOSIS — D6859 Other primary thrombophilia: Secondary | ICD-10-CM | POA: Diagnosis present

## 2011-05-20 DIAGNOSIS — I10 Essential (primary) hypertension: Secondary | ICD-10-CM | POA: Diagnosis present

## 2011-05-20 DIAGNOSIS — L89152 Pressure ulcer of sacral region, stage 2: Secondary | ICD-10-CM | POA: Diagnosis present

## 2011-05-20 DIAGNOSIS — D649 Anemia, unspecified: Secondary | ICD-10-CM

## 2011-05-20 DIAGNOSIS — A0472 Enterocolitis due to Clostridium difficile, not specified as recurrent: Principal | ICD-10-CM | POA: Diagnosis present

## 2011-05-20 HISTORY — DX: Other disorder of circulatory system: I99.8

## 2011-05-20 HISTORY — DX: Sepsis, unspecified organism: A41.9

## 2011-05-20 HISTORY — DX: Disorder of kidney and ureter, unspecified: N28.9

## 2011-05-20 LAB — COMPREHENSIVE METABOLIC PANEL
AST: 20 U/L (ref 0–37)
Albumin: 2 g/dL — ABNORMAL LOW (ref 3.5–5.2)
BUN: 14 mg/dL (ref 6–23)
Calcium: 8.9 mg/dL (ref 8.4–10.5)
Chloride: 106 mEq/L (ref 96–112)
Creatinine, Ser: 1.3 mg/dL (ref 0.50–1.35)
Total Bilirubin: 0.2 mg/dL — ABNORMAL LOW (ref 0.3–1.2)

## 2011-05-20 LAB — URINALYSIS, ROUTINE W REFLEX MICROSCOPIC
Ketones, ur: NEGATIVE mg/dL
Leukocytes, UA: NEGATIVE
Nitrite: NEGATIVE
Protein, ur: 100 mg/dL — AB
pH: 6 (ref 5.0–8.0)

## 2011-05-20 LAB — CBC
MCH: 28.5 pg (ref 26.0–34.0)
MCHC: 31.6 g/dL (ref 30.0–36.0)
MCV: 90.2 fL (ref 78.0–100.0)
Platelets: 265 10*3/uL (ref 150–400)
RDW: 15.5 % (ref 11.5–15.5)

## 2011-05-20 LAB — DIFFERENTIAL
Basophils Absolute: 0 10*3/uL (ref 0.0–0.1)
Eosinophils Absolute: 0.2 10*3/uL (ref 0.0–0.7)
Eosinophils Relative: 3 % (ref 0–5)
Lymphs Abs: 2.2 10*3/uL (ref 0.7–4.0)
Monocytes Absolute: 1.2 10*3/uL — ABNORMAL HIGH (ref 0.1–1.0)
Neutrophils Relative %: 54 % (ref 43–77)

## 2011-05-20 LAB — POCT I-STAT, CHEM 8
Calcium, Ion: 1.2 mmol/L (ref 1.12–1.32)
Chloride: 106 mEq/L (ref 96–112)
HCT: 25 % — ABNORMAL LOW (ref 39.0–52.0)
Sodium: 139 mEq/L (ref 135–145)
TCO2: 23 mmol/L (ref 0–100)

## 2011-05-20 LAB — URINE MICROSCOPIC-ADD ON

## 2011-05-20 LAB — LIPASE, BLOOD: Lipase: 13 U/L (ref 11–59)

## 2011-05-20 LAB — PROTIME-INR: INR: 1.81 — ABNORMAL HIGH (ref 0.00–1.49)

## 2011-05-20 MED ORDER — SACCHAROMYCES BOULARDII 250 MG PO CAPS
250.0000 mg | ORAL_CAPSULE | Freq: Two times a day (BID) | ORAL | Status: DC
  Administered 2011-05-20 – 2011-05-24 (×8): 250 mg via ORAL
  Filled 2011-05-20 (×9): qty 1

## 2011-05-20 MED ORDER — SODIUM CHLORIDE 0.9 % IV SOLN
INTRAVENOUS | Status: DC
  Administered 2011-05-20 – 2011-05-22 (×5): via INTRAVENOUS

## 2011-05-20 MED ORDER — SODIUM CHLORIDE 0.9 % IJ SOLN
3.0000 mL | Freq: Two times a day (BID) | INTRAMUSCULAR | Status: DC
  Administered 2011-05-23 (×2): 3 mL via INTRAVENOUS

## 2011-05-20 MED ORDER — ADULT MULTIVITAMIN W/MINERALS CH
1.0000 | ORAL_TABLET | Freq: Every day | ORAL | Status: DC
  Administered 2011-05-20 – 2011-05-24 (×5): 1 via ORAL
  Filled 2011-05-20 (×5): qty 1

## 2011-05-20 MED ORDER — SODIUM CHLORIDE 0.9 % IV BOLUS (SEPSIS)
1000.0000 mL | Freq: Once | INTRAVENOUS | Status: AC
  Administered 2011-05-20: 1000 mL via INTRAVENOUS

## 2011-05-20 MED ORDER — ACETAMINOPHEN 650 MG RE SUPP: 650.0000 mg | Freq: Four times a day (QID) | RECTAL | Status: DC | PRN

## 2011-05-20 MED ORDER — IOHEXOL 300 MG/ML  SOLN
80.0000 mL | Freq: Once | INTRAMUSCULAR | Status: AC | PRN
  Administered 2011-05-20: 80 mL via INTRAVENOUS

## 2011-05-20 MED ORDER — SODIUM CHLORIDE 0.9 % IV SOLN
INTRAVENOUS | Status: DC
  Administered 2011-05-20 (×2): 125 mL/h via INTRAVENOUS

## 2011-05-20 MED ORDER — ALBUTEROL SULFATE (5 MG/ML) 0.5% IN NEBU: 2.5000 mg | INHALATION_SOLUTION | RESPIRATORY_TRACT | Status: DC | PRN

## 2011-05-20 MED ORDER — METRONIDAZOLE IN NACL 5-0.79 MG/ML-% IV SOLN
500.0000 mg | Freq: Once | INTRAVENOUS | Status: AC
  Administered 2011-05-20: 500 mg via INTRAVENOUS
  Filled 2011-05-20: qty 100

## 2011-05-20 MED ORDER — AMLODIPINE BESYLATE 5 MG PO TABS
5.0000 mg | ORAL_TABLET | Freq: Every day | ORAL | Status: DC
  Administered 2011-05-20 – 2011-05-24 (×5): 5 mg via ORAL
  Filled 2011-05-20 (×5): qty 1

## 2011-05-20 MED ORDER — CIPROFLOXACIN IN D5W 400 MG/200ML IV SOLN
400.0000 mg | Freq: Two times a day (BID) | INTRAVENOUS | Status: DC
  Administered 2011-05-20 – 2011-05-21 (×2): 400 mg via INTRAVENOUS
  Filled 2011-05-20 (×3): qty 200

## 2011-05-20 MED ORDER — ONDANSETRON HCL 4 MG/2ML IJ SOLN
4.0000 mg | Freq: Once | INTRAMUSCULAR | Status: AC
  Administered 2011-05-20: 4 mg via INTRAVENOUS
  Filled 2011-05-20: qty 2

## 2011-05-20 MED ORDER — WARFARIN - PHARMACIST DOSING INPATIENT: Freq: Every day | Status: DC

## 2011-05-20 MED ORDER — LEVOFLOXACIN IN D5W 750 MG/150ML IV SOLN
750.0000 mg | INTRAVENOUS | Status: DC
  Administered 2011-05-20: 750 mg via INTRAVENOUS
  Filled 2011-05-20: qty 150

## 2011-05-20 MED ORDER — OXYCODONE HCL 5 MG PO TABS: 5.0000 mg | ORAL_TABLET | Freq: Four times a day (QID) | ORAL | Status: DC | PRN

## 2011-05-20 MED ORDER — FOLIC ACID 1 MG PO TABS
1.0000 mg | ORAL_TABLET | Freq: Every day | ORAL | Status: DC
  Administered 2011-05-20 – 2011-05-24 (×5): 1 mg via ORAL
  Filled 2011-05-20 (×5): qty 1

## 2011-05-20 MED ORDER — FENTANYL 25 MCG/HR TD PT72
25.0000 ug | MEDICATED_PATCH | TRANSDERMAL | Status: DC
  Administered 2011-05-20 – 2011-05-23 (×2): 25 ug via TRANSDERMAL
  Filled 2011-05-20 (×2): qty 1

## 2011-05-20 MED ORDER — IOHEXOL 300 MG/ML  SOLN
20.0000 mL | INTRAMUSCULAR | Status: AC
  Administered 2011-05-20 (×2): 20 mL via ORAL

## 2011-05-20 MED ORDER — HYDROMORPHONE HCL PF 1 MG/ML IJ SOLN
1.0000 mg | INTRAMUSCULAR | Status: DC | PRN
  Administered 2011-05-20 – 2011-05-21 (×3): 1 mg via INTRAVENOUS
  Filled 2011-05-20 (×3): qty 1

## 2011-05-20 MED ORDER — HYDROMORPHONE HCL PF 1 MG/ML IJ SOLN
1.0000 mg | Freq: Once | INTRAMUSCULAR | Status: AC
  Administered 2011-05-20: 1 mg via INTRAVENOUS
  Filled 2011-05-20: qty 1

## 2011-05-20 MED ORDER — ONDANSETRON HCL 4 MG/2ML IJ SOLN: 4.0000 mg | Freq: Four times a day (QID) | INTRAMUSCULAR | Status: DC | PRN

## 2011-05-20 MED ORDER — METOPROLOL TARTRATE 12.5 MG HALF TABLET
12.5000 mg | ORAL_TABLET | Freq: Two times a day (BID) | ORAL | Status: DC
  Administered 2011-05-20 – 2011-05-24 (×8): 12.5 mg via ORAL
  Filled 2011-05-20 (×9): qty 1

## 2011-05-20 MED ORDER — WARFARIN SODIUM 10 MG PO TABS
10.0000 mg | ORAL_TABLET | ORAL | Status: AC
  Administered 2011-05-20: 10 mg via ORAL
  Filled 2011-05-20: qty 1

## 2011-05-20 MED ORDER — METRONIDAZOLE 500 MG PO TABS
500.0000 mg | ORAL_TABLET | Freq: Three times a day (TID) | ORAL | Status: DC
  Administered 2011-05-20 – 2011-05-24 (×10): 500 mg via ORAL
  Filled 2011-05-20 (×14): qty 1

## 2011-05-20 MED ORDER — ONDANSETRON HCL 4 MG PO TABS: 4.0000 mg | ORAL_TABLET | Freq: Four times a day (QID) | ORAL | Status: DC | PRN

## 2011-05-20 MED ORDER — ACETAMINOPHEN 325 MG PO TABS: 650.0000 mg | ORAL_TABLET | Freq: Four times a day (QID) | ORAL | Status: DC | PRN

## 2011-05-20 NOTE — Progress Notes (Signed)
ANTICOAGULATION CONSULT NOTE - Initial Consult  Pharmacy Consult for Coumadin Indication: ?h/o CVA/MD not entirely sure but MD to contact PCP to find out indication  No Known Allergies  Patient Measurements:   Ht: 67 inches Wt: 52 kg  Vital Signs: Temp: 98.3 F (36.8 C) (03/29 1714) Temp src: Oral (03/29 1714) BP: 127/86 mmHg (03/29 1830) Pulse Rate: 80  (03/29 1830)  Labs:  Basename 05/20/11 1658 05/20/11 1339 05/20/11 1314  HGB -- 8.5* 8.4*  HCT -- 25.0* 26.6*  PLT -- -- 265  APTT -- -- --  LABPROT 21.3* -- --  INR 1.81* -- --  HEPARINUNFRC -- -- --  CREATININE -- 1.50* 1.30  CKTOTAL -- -- --  CKMB -- -- --  TROPONINI -- -- --   The CrCl is unknown because both a height and weight (above a minimum accepted value) are required for this calculation.  Medical History: Past Medical History  Diagnosis Date  . Hypertension   . CVA (cerebral infarction)   . 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 Renal disorder   . Sepsis   . Vascular insufficiency     Medications:  See med rec - Coumadin 8 mg daily  Assessment: 66 y.o. male presents from NH with abd pain. Pt on coumadin PTA for ?indicatino - perhaps h/o CVA. MD to call PCP and find out indication. Baseline INR slightly subtherapeutic.  Goal of Therapy:  INR 2-3   Plan:  1. Coumadin 10mg  po tonight 2. Daily INR  Christoper Fabian, PharmD, BCPS Clinical pharmacist, pager [phone] 05/20/2011,6:56 PM

## 2011-05-20 NOTE — ED Notes (Signed)
Patient is currently a patient at [facility] was sent to the ed today for gallbladder u/s. And then was sent to the ed for further eval. pateint is alert  C/o abd. Pain for 3 days, Denies nausea however he states he can't eat

## 2011-05-20 NOTE — ED Provider Notes (Signed)
History     CSN: [id number]  Arrival date & time 05/20/11  1211   None     Chief Complaint  Patient presents with  . Abdominal Pain    (Consider location/radiation/quality/duration/timing/severity/associated sxs/prior treatment) HPI This 66 year old male has a gradual onset of constant diffuse abdominal pain worse in the left lower quadrant the last 3 days with nausea with multiple episodes of nonbloody diarrhea with generalized weakness and inability to orally to take fluids or solids down the last 3 days. He has no chest pain cough or shortness. He has minimal if any right upper quadrant tenderness. He was sent from [facility] to the emergency department after having an abnormal gallbladder ultrasound today consistent with cholecystitis. There's been no fever or confusion.  Past Medical History  Diagnosis Date  . Hypertension   . CVA (cerebral infarction)   . 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 Renal disorder   . Sepsis   . Vascular insufficiency   Cdiff colitis  Past Surgical History  Procedure Date  . Back surgery 2002/2009  . Laparotomy 12/18/2010    Procedure: EXPLORATORY LAPAROTOMY;  Surgeon: Fabio Bering;  Location: [facility];  Service: General;  Laterality: N/A;  . Partial colectomy 12/18/2010    Procedure: PARTIAL COLECTOMY;  Surgeon: Fabio Bering;  Location: [facility];  Service: General;  Laterality: N/A;  Preop Diagnosis: Severe right lower quadrant abdominal pain  Postop Diagnosis: Diffuse terminal ileitis/ischemia  Procedure: Exploratory laparotomy, right hemicolectomy with side-to-side stapled ileocolonic anastomoses  Surgeon: Dr. Tilford Pillar   Family History  Problem Relation Age of Onset  . Colon cancer Neg Hx   . Liver disease Neg Hx     History  Substance Use Topics  . Smoking status: Former Smoker -- 0.2 packs/day for 40 years    Types: Cigarettes  . Smokeless tobacco: Never Used   Comment: 2-3 cigarettes per day  . Alcohol Use: No       Review of Systems  Constitutional: Negative for fever.       10 Systems reviewed and are negative for acute change except as noted in the HPI.  HENT: Negative for congestion.   Eyes: Negative for discharge and redness.  Respiratory: Negative for cough and shortness of breath.   Cardiovascular: Negative for chest pain.  Gastrointestinal: Positive for nausea, abdominal pain and diarrhea. Negative for vomiting and blood in stool.  Genitourinary: Positive for dysuria.  Musculoskeletal: Negative for back pain.  Skin: Negative for rash.  Neurological: Negative for syncope, numbness and headaches.  Psychiatric/Behavioral:       No behavior change.    Allergies  Review of patient's allergies indicates no known allergies.  Home Medications   No current outpatient prescriptions on file.  BP 149/89  Pulse 83  Temp(Src) 97.8 F (36.6 C) (Oral)  Resp 18  Ht 5\' 7"  (1.702 m)  Wt 124 lb 9 oz (56.5 kg)  BMI 19.51 kg/m2  SpO2 94%  Physical Exam  Nursing note and vitals reviewed. Constitutional: He is oriented to person, place, and time.       Awake, alert, nontoxic appearance.  HENT:  Head: Atraumatic.  Eyes: Right eye exhibits no discharge. Left eye exhibits no discharge.  Neck: Neck supple.  Pulmonary/Chest: Effort normal. He exhibits no tenderness.  Abdominal: Soft. Bowel sounds are normal. He exhibits no distension and no mass. There is tenderness. There is rebound and guarding.       Patient has diffuse abdominal tenderness to  percussion mostly over the left lower quadrant with diffuse rebound present worse on the left lower quadrant consistent with generalized peritonitis  Musculoskeletal: He exhibits no edema and no tenderness.       Baseline ROM, no obvious new focal weakness.  Neurological: He is alert and oriented to person, place, and time.       Mental status and motor strength appears baseline for patient and situation.  Skin: No rash noted.  Psychiatric: He has  a normal mood and affect.    ED Course  Procedures (including critical care time) ECG: Sinus rhythm, ventricular rate 79, axis normal,RSR' V1/2, LVH, no significant change noted compared to June 2011  The patient's ultrasound findings are consistent with acute cholecystitis however his history and examination are concerning for potential diverticulitis or other cause of peritonitis/colitis since the patient mostly has left lower quadrant pain tenderness and diarrhea. Therefore rather than rely only on the ultrasound results CT scan disorder with results pending.  CT reviewed with [facility] and d/w Surg, Surg will consult and paged [facility] for admit. Re-exam still mostly tender left abd with moderate tenderness across upper abd and suprapubic, not really tender RLQ and RUQ not as tender as left side abd. 1725   Labs Reviewed  CBC - Abnormal; Notable for the following:    RBC 2.95 (*)    Hemoglobin 8.4 (*)    HCT 26.6 (*)    All other components within normal limits  DIFFERENTIAL - Abnormal; Notable for the following:    Monocytes Relative 15 (*)    Monocytes Absolute 1.2 (*)    All other components within normal limits  COMPREHENSIVE METABOLIC PANEL - Abnormal; Notable for the following:    Albumin 2.0 (*)    Total Bilirubin 0.2 (*)    GFR calc non Af Amer 56 (*)    GFR calc Af Amer 65 (*)    All other components within normal limits  URINALYSIS, ROUTINE W REFLEX MICROSCOPIC - Abnormal; Notable for the following:    Hgb urine dipstick LARGE (*)    Protein, ur 100 (*)    All other components within normal limits  POCT I-STAT, CHEM 8 - Abnormal; Notable for the following:    Creatinine, Ser 1.50 (*)    Hemoglobin 8.5 (*)    HCT 25.0 (*)    All other components within normal limits  PROTIME-INR - Abnormal; Notable for the following:    Prothrombin Time 21.3 (*)    INR 1.81 (*)    All other components within normal limits  LIPASE, BLOOD  URINE MICROSCOPIC-ADD ON  POCT I-STAT  TROPONIN I  TYPE AND SCREEN  CLOSTRIDIUM DIFFICILE BY PCR  PROTIME-INR  COMPREHENSIVE METABOLIC PANEL  CBC   US Abdomen Complete  05/20/2011  *RADIOLOGY REPORT*  Clinical Data:  Abdominal pain.  History of hemicolectomy.  COMPLETE ABDOMINAL ULTRASOUND  Comparison:  04/19/2011.  Findings:  Gallbladder:  Positive sonographic Murphy's sign, wall thickening and pericholecystic fluid compatible with acute cholecystitis. Echogenic foci within thegallbladder are atypical for ordinary sludge or stones and probably represents tumefactive sludge, hemorrhage  or sloughing of the gallbladder wall. There is no sonographic evidence of emphysematous cholecystitis.  Common bile duct:  Mildly dilated for age at 9 mm.  If cholecystectomy is performed, intraoperative cholangiogram is recommended.  Liver:  No focal lesion identified.  Within normal limits in parenchymal echogenicity.  IVC:  Appears normal.  Pancreas:  No focal abnormality seen.  Spleen:  68 mm.  Normal echotexture.  Right Kidney:  Echogenic cortex, likely representing medical renal disease.  11.5 cm long axis.  Normal central sinus echo complex.  Left Kidney:  Echogenic cortex, likely representing medical renal disease.  11.7 cm.  Normal central sinus echo complex.  Abdominal aorta:  Suboptimal visualization of the mid abdominal aorta.  Mild fusiform dilation of the distal abdominal aorta on the prior CT.  Abdominal aortic atherosclerosis.  IMPRESSION: 1.Gallbladder wall thickening, pericholecystic fluid and positive sonographic Murphy's sign with echogenic foci in the gallbladder consistent with acute cholecystitis. Mild dilation of the common bile duct for age without common duct stone identified.  If cholecystectomy performed, intraoperative cholangiogram is recommended. 2.  Echogenic renal cortex compatible with medical renal disease. 3.  Abdominal aortic atherosclerosis.  Original Report Authenticated By: Andreas Newport, M.D.   Ct Abdomen Pelvis W  Contrast  05/20/2011  *RADIOLOGY REPORT*  Clinical Data: Left lower quadrant pain  CT ABDOMEN AND PELVIS WITH CONTRAST  Technique:  Multidetector CT imaging of the abdomen and pelvis was performed following the standard protocol during bolus administration of intravenous contrast.  Contrast: 80mL OMNIPAQUE IOHEXOL 300 MG/ML IJ SOLN 80 ml Omnipaque- 300 IV  Comparison: [facility] CT abdomen pelvis dated 04/19/2011  Findings: Dependent atelectasis in the bilateral lower lobes.  Liver is notable for periportal edema.  Suspected cystic lesion in the right hepatic dome is not well visualized on the current study (series 2/image 13).  Spleen, pancreas, and adrenal glands are within normal limits.  Gallbladder is notable for gallbladder wall edema and gallbladder sludge (series 2/image 37).  Kidneys are within normal limits.  No hydronephrosis.  No evidence of bowel obstruction.  Status post right hemicolectomy. Colonic wall thickening, most prominent in the distal sigmoid colon (series 2/image 64), suspicious for infectious/inflammatory colitis such as pseudomembranous colitis.  Atherosclerotic calcifications of the abdominal aorta and branch vessels.  Diffuse mesenteric edema.  No free air.  Small retroperitoneal lymph nodes which do not meet pathologic CT size criteria.  Prostate is unremarkable.  Bladder is within normal limits.  Degenerative changes of the visualized thoracolumbar spine.  Stable superior endplate changes at L4.  Stable fusion at L5-S1.  IMPRESSION: Status post right hemicolectomy.  Colonic wall thickening, most prominent in the distal sigmoid colon, suspicious for infectious/inflammatory colitis such as pseudomembranous colitis.  Diffuse mesenteric and periportal, likely secondary to hypoalbuminemia.  Gallbladder sludge.  Gallbladder wall edema, favored to be secondary to hypoalbuminemia, although acute cholecystitis cannot be entirely excluded.  Original Report Authenticated By: Charline Bills,  M.D.   US Renal  05/19/2011  *RADIOLOGY REPORT*  Clinical Data:  kidneys stones  RENAL/URINARY TRACT ULTRASOUND COMPLETE  Comparison:  Ultrasound 12/20/2010 and CT scan 04/19/2011  Findings:  Right Kidney:  Measures 11 cm in length.  No hydronephrosis or diagnostic renal calculus.  Again noted cortical increased echogenicity.  Left Kidney:  Measures 11.8 cm in length.  Again noted cortical increased echogenicity.  No hydronephrosis or diagnostic renal calculus.  Bladder:  No bladder filling defects are identified.  There is abnormal appearance of visualized gallbladder. Heterogeneous irregular filling defect is noted within gallbladder. This may represent sludge ball or gallbladder wall polyp.  There is no sonographic Murphy's sign.  IMPRESSION:  1.  No hydronephrosis or diagnostic renal calculus.  Again noted bilateral renal cortical increased echogenicity due to chronic medical renal disease. 2.  Abnormal appearance of the gallbladder with a heterogeneous irregular filling defect within gallbladder.  This may represent gallbladder polyp or  sludge ball.  Clinical correlation is necessary.  Further evaluation with CT scan or MRCP could be performed as clinically warranted.  Original Report Authenticated By: Natasha Mead, M.D.     1. Abdominal pain   2. Colitis   3. Anemia       MDM   Patient / Family / Caregiver understand and agree with initial ED impression and plan with expectations set for ED visit.Pt stable in ED with no significant deterioration in condition.Patient / Family / Caregiver informed of clinical course, understand medical decision-making process, and agree with plan.The patient appears reasonably stabilized for admission considering the current resources, flow, and capabilities available in the ED at this time, and I doubt any other [facility] requiring further screening and/or treatment in the ED prior to admission.      Hurman Horn, MD 05/20/11 [phone]

## 2011-05-20 NOTE — H&P (Signed)
Brian [name] is an 66 y.o. male.    PCP: Josue Hector, MD, MD  Currently, he seen by Dr. Bufford Spikes at [facility].  Chief Complaint: Abdominal pain  HPI: This is a 66 year old, African American male, who presents to the hospital with 2-3 day history of left lower abdominal pain, radiating to the back. Patient has a past medical history of hypertension, and a previous history of stroke. He tells me that the abdominal pain was associated with diarrhea, which was small in quantity, but watery. He had multiple episodes over the course of 2-3 days, but hasn't had any episodes today. He also mentioned, that he's had a lot of pain while having a bowel movement. Had some nausea, but denies any emesis. Denies any blood in the stool. Denies any fever. Patient is somewhat of a poor historian. Initially, he told me the pain was in the left side but then he also mentioned that he had some pain in the right side as well. The pain is 9/10 in intensity, constant pain, described as sharp in character. He cannot think of any precipitating factors and there are no aggravating or relieving factors.  Patient has a complicated course in the recent past. He was admitted with similar complaints but was diagnosed with enteritis back in October to [facility]. 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 Pathology suggested vasculitis and so, the patient was transferred to [facility] for further management. Records from [facility] were reviewed by another physician when patient was admitted at [facility] and apparently, vasculitis was not thought to be likely at that time. But the patient was discharged on warfarin the reason for which not entirely clear.   Home Medications: Prior to Admission medications   Medication Sig Start Date End Date Taking? Authorizing Provider  acetaminophen (TYLENOL) 325 MG tablet Take 650 mg by mouth every 4 (four) hours  as needed. For temp > 101.   Yes Historical Provider, MD  amLODipine (NORVASC) 5 MG tablet Take 5 mg by mouth daily.   Yes Historical Provider, MD  bisacodyl (DULCOLAX) 10 MG suppository Place 10 mg rectally every 3 (three) days. For constipation   Yes Historical Provider, MD  fentaNYL (DURAGESIC - DOSED MCG/HR) 25 MCG/HR Place 1 patch onto the skin every 3 (three) days.   Yes Historical Provider, MD  folic acid (FOLVITE) 1 MG tablet Take 1 mg by mouth daily.   Yes Historical Provider, MD  metoprolol tartrate (LOPRESSOR) 25 MG tablet Take 12.5 mg by mouth 2 (two) times daily.   Yes Historical Provider, MD  Multiple Vitamin (MULITIVITAMIN WITH MINERALS) TABS Take 1 tablet by mouth daily.   Yes Historical Provider, MD  oxyCODONE (OXY IR/ROXICODONE) 5 MG immediate release tablet Take 5-10 mg by mouth every 6 (six) hours as needed. For pain.   Yes Historical Provider, MD  potassium chloride SA (K-DUR,KLOR-CON) 20 MEQ tablet Take 40 mEq by mouth daily.   Yes Historical Provider, MD  promethazine (PHENERGAN) 25 MG tablet Take 25 mg by mouth every 6 (six) hours as needed. For nausea/vomiting.   Yes Historical Provider, MD  saccharomyces boulardii (FLORASTOR) 250 MG capsule Take 250 mg by mouth 2 (two) times daily.   Yes Historical Provider, MD  sevelamer (RENVELA) 800 MG tablet Take 2,400 mg by mouth 2 (two) times daily.   Yes Historical Provider, MD  warfarin (COUMADIN) 4 MG tablet Take 8 mg by mouth every evening.  Yes Historical Provider, MD    Allergies: No Known Allergies  Past Medical History: Past Medical History  Diagnosis Date  . Hypertension   . CVA (cerebral infarction)   . 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 Renal disorder   . Sepsis   . Vascular insufficiency     Past Surgical History  Procedure Date  . Back surgery 2002/2009  . Laparotomy 12/18/2010    Procedure: EXPLORATORY LAPAROTOMY;  Surgeon: Fabio Bering;  Location: [facility];  Service: General;  Laterality: N/A;  .  Partial colectomy 12/18/2010    Procedure: PARTIAL COLECTOMY;  Surgeon: Fabio Bering;  Location: [facility];  Service: General;  Laterality: N/A;    Social History:  reports that he has quit smoking. His smoking use included Cigarettes. He has a 10 pack-year smoking history. He has never used smokeless tobacco. He reports that he uses illicit drugs (Marijuana). He reports that he does not drink alcohol.  Family History:  Family History  Problem Relation Age of Onset  . Colon cancer Neg Hx   . Liver disease Neg Hx    Patient states he doesn't know of any medical problems in his family.  Review of Systems - History obtained from the patient General ROS: negative Psychological ROS: negative Ophthalmic ROS: negative ENT ROS: negative Allergy and Immunology ROS: negative Hematological and Lymphatic ROS: negative Endocrine ROS: negative Respiratory ROS: no cough, shortness of breath, or wheezing Cardiovascular ROS: no chest pain or dyspnea on exertion Gastrointestinal ROS: as in hpi Genito-Urinary ROS: negative Musculoskeletal ROS: positive for back pain Neurological ROS: negative Dermatological ROS: negative  Physical Examination Blood pressure 130/94, pulse 80, temperature 98.3 F (36.8 C), temperature source Oral, resp. rate 17, SpO2 97.00%.  General appearance: alert, cooperative, appears stated age and no distress Head: Normocephalic, without obvious abnormality, atraumatic Eyes: conjunctivae/corneas clear. PERRL, EOM's intact.  Throat: dry mm Neck: no adenopathy, no carotid bruit, no JVD, supple, symmetrical, trachea midline and thyroid not enlarged, symmetric, no tenderness/mass/nodules Back: symmetric, no curvature. ROM normal. No CVA tenderness. Resp: clear to auscultation bilaterally Cardio: regular rate and rhythm, S1, S2 normal, no murmur, click, rub or gallop GI: Soft, tender in the left lower quadrant as well as right upper quadrant. Murphy's was equivocal. No  rebound. BS sluggish. No masses or organomegaly. Rectal exam revealed no external lesions. There was no pain or tenderness. Upon digital examination. Unfortunately, no stool was obtained. Extremities: extremities normal, atraumatic, no cyanosis or edema Pulses: 2+ and symmetric Skin: Stage 2 sacral decube Lymph nodes: Cervical, supraclavicular, and axillary nodes normal. Neurologic: Grossly normal  Laboratory Data: Results for orders placed during the hospital encounter of 05/20/11 (from the past 48 hour(s))  CBC     Status: Abnormal   Collection Time   05/20/11  1:14 PM      Component Value Range Comment   WBC 7.7  4.0 - 10.5 (K/uL)    RBC 2.95 (*) 4.22 - 5.81 (MIL/uL)    Hemoglobin 8.4 (*) 13.0 - 17.0 (g/dL)    HCT 40.9 (*) 81.1 - 52.0 (%)    MCV 90.2  78.0 - 100.0 (fL)    MCH 28.5  26.0 - 34.0 (pg)    MCHC 31.6  30.0 - 36.0 (g/dL)    RDW 91.4  78.2 - 95.6 (%)    Platelets 265  150 - 400 (K/uL)   DIFFERENTIAL     Status: Abnormal   Collection Time   05/20/11  1:14 PM  Component Value Range Comment   Neutrophils Relative 54  43 - 77 (%)    Lymphocytes Relative 28  12 - 46 (%)    Monocytes Relative 15 (*) 3 - 12 (%)    Eosinophils Relative 3  0 - 5 (%)    Basophils Relative 0  0 - 1 (%)    Neutro Abs 4.1  1.7 - 7.7 (K/uL)    Lymphs Abs 2.2  0.7 - 4.0 (K/uL)    Monocytes Absolute 1.2 (*) 0.1 - 1.0 (K/uL)    Eosinophils Absolute 0.2  0.0 - 0.7 (K/uL)    Basophils Absolute 0.0  0.0 - 0.1 (K/uL)    RBC Morphology POLYCHROMASIA PRESENT      WBC Morphology ATYPICAL LYMPHOCYTES     COMPREHENSIVE METABOLIC PANEL     Status: Abnormal   Collection Time   05/20/11  1:14 PM      Component Value Range Comment   Sodium 135  135 - 145 (mEq/L)    Potassium 4.3  3.5 - 5.1 (mEq/L)    Chloride 106  96 - 112 (mEq/L)    CO2 22  19 - 32 (mEq/L)    Glucose, Bld 73  70 - 99 (mg/dL)    BUN 14  6 - 23 (mg/dL)    Creatinine, Ser 1.61  0.50 - 1.35 (mg/dL)    Calcium 8.9  8.4 - 10.5 (mg/dL)     Total Protein 7.2  6.0 - 8.3 (g/dL)    Albumin 2.0 (*) 3.5 - 5.2 (g/dL)    AST 20  0 - 37 (U/L)    ALT 15  0 - 53 (U/L)    Alkaline Phosphatase 69  39 - 117 (U/L)    Total Bilirubin 0.2 (*) 0.3 - 1.2 (mg/dL)    GFR calc non Af Amer 56 (*) >90 (mL/min)    GFR calc Af Amer 65 (*) >90 (mL/min)   LIPASE, BLOOD     Status: Normal   Collection Time   05/20/11  1:14 PM      Component Value Range Comment   Lipase 13  11 - 59 (U/L)   URINALYSIS, ROUTINE W REFLEX MICROSCOPIC     Status: Abnormal   Collection Time   05/20/11  1:27 PM      Component Value Range Comment   Color, Urine YELLOW  YELLOW     APPearance CLEAR  CLEAR     Specific Gravity, Urine 1.011  1.005 - 1.030     pH 6.0  5.0 - 8.0     Glucose, UA NEGATIVE  NEGATIVE (mg/dL)    Hgb urine dipstick LARGE (*) NEGATIVE     Bilirubin Urine NEGATIVE  NEGATIVE     Ketones, ur NEGATIVE  NEGATIVE (mg/dL)    Protein, ur 096 (*) NEGATIVE (mg/dL)    Urobilinogen, UA 0.2  0.0 - 1.0 (mg/dL)    Nitrite NEGATIVE  NEGATIVE     Leukocytes, UA NEGATIVE  NEGATIVE    URINE MICROSCOPIC-ADD ON     Status: Normal   Collection Time   05/20/11  1:27 PM      Component Value Range Comment   Squamous Epithelial / LPF RARE  RARE     RBC / HPF 21-50  <3 (RBC/hpf)    Bacteria, UA RARE  RARE     Urine-Other AMORPHOUS URATES/PHOSPHATES     POCT I-STAT TROPONIN I     Status: Normal   Collection Time   05/20/11  1:37 PM  Component Value Range Comment   Troponin i, poc 0.00  0.00 - 0.08 (ng/mL)    Comment 3            POCT I-STAT, CHEM 8     Status: Abnormal   Collection Time   05/20/11  1:39 PM      Component Value Range Comment   Sodium 139  135 - 145 (mEq/L)    Potassium 4.3  3.5 - 5.1 (mEq/L)    Chloride 106  96 - 112 (mEq/L)    BUN 14  6 - 23 (mg/dL)    Creatinine, Ser 1.61 (*) 0.50 - 1.35 (mg/dL)    Glucose, Bld 76  70 - 99 (mg/dL)    Calcium, Ion 0.96  1.12 - 1.32 (mmol/L)    TCO2 23  0 - 100 (mmol/L)    Hemoglobin 8.5 (*) 13.0 - 17.0 (g/dL)     HCT 04.5 (*) 40.9 - 52.0 (%)   PROTIME-INR     Status: Abnormal   Collection Time   05/20/11  4:58 PM      Component Value Range Comment   Prothrombin Time 21.3 (*) 11.6 - 15.2 (seconds)    INR 1.81 (*) 0.00 - 1.49    TYPE AND SCREEN     Status: Normal   Collection Time   05/20/11  4:58 PM      Component Value Range Comment   ABO/RH(D) O POS      Antibody Screen NEG      Sample Expiration 05/23/2011       Radiology Reports: US Abdomen Complete  05/20/2011  *RADIOLOGY REPORT*  Clinical Data:  Abdominal pain.  History of hemicolectomy.  COMPLETE ABDOMINAL ULTRASOUND  Comparison:  04/19/2011.  Findings:  Gallbladder:  Positive sonographic Murphy's sign, wall thickening and pericholecystic fluid compatible with acute cholecystitis. Echogenic foci within thegallbladder are atypical for ordinary sludge or stones and probably represents tumefactive sludge, hemorrhage  or sloughing of the gallbladder wall. There is no sonographic evidence of emphysematous cholecystitis.  Common bile duct:  Mildly dilated for age at 9 mm.  If cholecystectomy is performed, intraoperative cholangiogram is recommended.  Liver:  No focal lesion identified.  Within normal limits in parenchymal echogenicity.  IVC:  Appears normal.  Pancreas:  No focal abnormality seen.  Spleen:  68 mm.  Normal echotexture.  Right Kidney:  Echogenic cortex, likely representing medical renal disease.  11.5 cm long axis.  Normal central sinus echo complex.  Left Kidney:  Echogenic cortex, likely representing medical renal disease.  11.7 cm.  Normal central sinus echo complex.  Abdominal aorta:  Suboptimal visualization of the mid abdominal aorta.  Mild fusiform dilation of the distal abdominal aorta on the prior CT.  Abdominal aortic atherosclerosis.  IMPRESSION: 1.Gallbladder wall thickening, pericholecystic fluid and positive sonographic Murphy's sign with echogenic foci in the gallbladder consistent with acute cholecystitis. Mild dilation of  the common bile duct for age without common duct stone identified.  If cholecystectomy performed, intraoperative cholangiogram is recommended. 2.  Echogenic renal cortex compatible with medical renal disease. 3.  Abdominal aortic atherosclerosis.  Original Report Authenticated By: Andreas Newport, M.D.   Ct Abdomen Pelvis W Contrast  05/20/2011  *RADIOLOGY REPORT*  Clinical Data: Left lower quadrant pain  CT ABDOMEN AND PELVIS WITH CONTRAST  Technique:  Multidetector CT imaging of the abdomen and pelvis was performed following the standard protocol during bolus administration of intravenous contrast.  Contrast: 80mL OMNIPAQUE IOHEXOL 300 MG/ML IJ SOLN 80 ml Omnipaque-  300 IV  Comparison: [facility] CT abdomen pelvis dated 04/19/2011  Findings: Dependent atelectasis in the bilateral lower lobes.  Liver is notable for periportal edema.  Suspected cystic lesion in the right hepatic dome is not well visualized on the current study (series 2/image 13).  Spleen, pancreas, and adrenal glands are within normal limits.  Gallbladder is notable for gallbladder wall edema and gallbladder sludge (series 2/image 37).  Kidneys are within normal limits.  No hydronephrosis.  No evidence of bowel obstruction.  Status post right hemicolectomy. Colonic wall thickening, most prominent in the distal sigmoid colon (series 2/image 64), suspicious for infectious/inflammatory colitis such as pseudomembranous colitis.  Atherosclerotic calcifications of the abdominal aorta and branch vessels.  Diffuse mesenteric edema.  No free air.  Small retroperitoneal lymph nodes which do not meet pathologic CT size criteria.  Prostate is unremarkable.  Bladder is within normal limits.  Degenerative changes of the visualized thoracolumbar spine.  Stable superior endplate changes at L4.  Stable fusion at L5-S1.  IMPRESSION: Status post right hemicolectomy.  Colonic wall thickening, most prominent in the distal sigmoid colon, suspicious for  infectious/inflammatory colitis such as pseudomembranous colitis.  Diffuse mesenteric and periportal, likely secondary to hypoalbuminemia.  Gallbladder sludge.  Gallbladder wall edema, favored to be secondary to hypoalbuminemia, although acute cholecystitis cannot be entirely excluded.  Original Report Authenticated By: Charline Bills, M.D.   US Renal  05/19/2011  *RADIOLOGY REPORT*  Clinical Data:  kidneys stones  RENAL/URINARY TRACT ULTRASOUND COMPLETE  Comparison:  Ultrasound 12/20/2010 and CT scan 04/19/2011  Findings:  Right Kidney:  Measures 11 cm in length.  No hydronephrosis or diagnostic renal calculus.  Again noted cortical increased echogenicity.  Left Kidney:  Measures 11.8 cm in length.  Again noted cortical increased echogenicity.  No hydronephrosis or diagnostic renal calculus.  Bladder:  No bladder filling defects are identified.  There is abnormal appearance of visualized gallbladder. Heterogeneous irregular filling defect is noted within gallbladder. This may represent sludge ball or gallbladder wall polyp.  There is no sonographic Murphy's sign.  IMPRESSION:  1.  No hydronephrosis or diagnostic renal calculus.  Again noted bilateral renal cortical increased echogenicity due to chronic medical renal disease. 2.  Abnormal appearance of the gallbladder with a heterogeneous irregular filling defect within gallbladder.  This may represent gallbladder polyp or sludge ball.  Clinical correlation is necessary.  Further evaluation with CT scan or MRCP could be performed as clinically warranted.  Original Report Authenticated By: Natasha Mead, M.D.    Electrocardiogram: EKG shows sinus rhythm at 79 beats per minute. Normal axis. Normal Intervals. No Q waves. No concerning ST or T-wave changes.  Assessment/Plan  Principal Problem:  *Colitis Active Problems:  HTN (hypertension)  Diarrhea  H/O: CVA (cardiovascular accident)  Acute abdominal pain  Anemia  Long term current use of  anticoagulant  Abnormal gallbladder ultrasound  Sacral decubitus ulcer, stage II  Malnutrition, calorie   #1 abdominal pain with diarrhea: CT scan suggeseted colitis in the sigmoid colon. Patient was recently treated for C. Difficile Colitis back in February. He was treated for 2 weeks with Flagyl. He could have recurrent disease. Stool will be sent off for C. difficile, PCR. Patient will be empirically put on oral Flagyl. Ciprofloxacin will also be given intravenously for now. Depending on his clinical course further investigations and consultations may be considered.  #2 possible cholecystitis: Ultrasound and CT suggests thickening of the gallbladder wall. He is somewhat tender in that area. His LFTs however, are  normal. We await input from general surgery.  #3 long-term anticoagulation: The reason for his warfarin is not entirely clear. Review of his previous records did not give a clear indication. We will need to pursue this further by contacting his PCP. Patient does not know. In the, meantime, continue with warfarin unless the patient undergo surgery.  #4 anemia: He does have chronic anemia. His blood counts will be monitored closely. He had an anemia panel recently in February, so, one would not be repeated.  #5 stage II sacral decubitus ulcer: Wound care will be consulted.  #6 protein calorie malnutrition: This will be addressed once patient is able to take orally.  #7 history of stroke is stable.  #8 history of hypertension. Monitor him closely.  DVT, prophylaxis. He is on therapeutic anticoagulation.  He is a full code  Further management decisions will depend on results of further testing and patient's response to treatment.  [facility]  [facility] Pager [phone]  05/20/2011, 6:32 PM

## 2011-05-20 NOTE — Consult Note (Signed)
  This patient comes in with acute on chronic relapsing abdominal pain and a CT scan demonstrating a diffuse colitis.  Looking back on the patient's records he's had problems with relapsing severe abdominal pain since October of 2012. 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 Since that time he said continued abdominal pain and he presents today to [facility] the emergency room with diffuse abdominal pain and what was felt to be diffuse peritonitis. Outside of his abdominal surgery he has had back surgery he has a history of vasculitis.  On my examination his vital signs are stable. The patient appears to be acutely uncomfortable. However when distracted and examining his abdomen was soft without any particular guarding he had normal and perhaps hyperactive bowel sounds. It is after my examination of the patient noted that in February of 2012 on the 27th he had a Clostridium difficile toxin PCR sent which was positive. He has been treated for that. He is also on Coumadin for his apparent vasculitis.  He is chronically anemic with a hemoglobin of approximately 8.5. His white blood cell count is normal at 7.7. The patient has no fever.  Impression:  I do not feel as though the patient has significant peritonitis secondary to his diffuse colitis noted on CT scan. 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 I've said for the patient have another Clostridium difficile PCR sent stat. Even the third Clostridium difficile toxin titer is negative the patient does not have a surgical indication at this time. He's got normal active bowel sounds and when distracted his abdomen is soft and minimally tender.  Plan:  If the Clostridium difficile toxin titer is positive I would treat her with metronidazole orally unless  has been shown to be a failure in which case he should go on vancomycin oral treatment. If the PCR is negative and this is indeed perhaps a vascular colitis are some other etiology should be treated medically medically with bowel rest and maybe other antibiotics for this however it is considered to be a vascular colitis antibiotics may not be warranted and could exacerbate a Clostridium difficile colitis. We will follow this patient with you however the likelihood of needing surgical intervention is very low at this time.  Marta Lamas. Gae Bon, MD, FACS [phone] [facility]

## 2011-05-20 NOTE — ED Notes (Signed)
Pt received to [facility] 8 with c/o low abdominal pain onset 3 days ago with nausea. Pt claimed that his appetite has been decreased. Pt also claimed that his urine is tea in color. Pt is A/A/OX4, skin is warm and dry, respiration is even and unlabored

## 2011-05-21 LAB — COMPREHENSIVE METABOLIC PANEL
ALT: 14 U/L (ref 0–53)
BUN: 13 mg/dL (ref 6–23)
CO2: 20 mEq/L (ref 19–32)
Calcium: 8.4 mg/dL (ref 8.4–10.5)
Chloride: 106 mEq/L (ref 96–112)
GFR calc Af Amer: 73 mL/min — ABNORMAL LOW (ref >90)
GFR calc non Af Amer: 63 mL/min — ABNORMAL LOW (ref >90)
Glucose, Bld: 60 mg/dL — ABNORMAL LOW (ref 70–99)
Potassium: 4.2 mEq/L (ref 3.5–5.1)
Total Protein: 6.8 g/dL (ref 6.0–8.3)

## 2011-05-21 LAB — CBC
HCT: 26 % — ABNORMAL LOW (ref 39.0–52.0)
Hemoglobin: 8.3 g/dL — ABNORMAL LOW (ref 13.0–17.0)
MCHC: 31.9 g/dL (ref 30.0–36.0)
RDW: 15.1 % (ref 11.5–15.5)
WBC: 8.7 10*3/uL (ref 4.0–10.5)

## 2011-05-21 LAB — PROTIME-INR
INR: 2.18 — ABNORMAL HIGH (ref 0.00–1.49)
Prothrombin Time: 24.6 seconds — ABNORMAL HIGH (ref 11.6–15.2)

## 2011-05-21 MED ORDER — HYDROMORPHONE HCL PF 1 MG/ML IJ SOLN
1.0000 mg | INTRAMUSCULAR | Status: DC | PRN
  Administered 2011-05-21 – 2011-05-23 (×9): 1 mg via INTRAVENOUS
  Filled 2011-05-21 (×9): qty 1

## 2011-05-21 MED ORDER — WARFARIN SODIUM 4 MG PO TABS
8.0000 mg | ORAL_TABLET | Freq: Once | ORAL | Status: AC
  Administered 2011-05-21: 8 mg via ORAL
  Filled 2011-05-21: qty 2

## 2011-05-21 NOTE — Progress Notes (Signed)
PCP: Josue Hector, MD, MD  Brief HPI:  This is a 66 year old, African American Brian [name], who presented to the hospital with 2-3 day history of left lower abdominal pain, radiating to the back. Patient has a past medical history of hypertension, and a previous history of stroke. He tells that the abdominal pain was associated with diarrhea, which was small in quantity, but watery. He had multiple episodes over the course of 2-3 days, but hasn't had any episodes on the day of admission. He also mentioned, that he's had a lot of pain while having a bowel movement. Had some nausea, but denied any emesis. Denied any blood in the stool. Denied any fever. Patient was somewhat of a poor historian. Initially, he told me the pain was in the left side but then he also mentioned that he had some pain in the right side as well. The pain is 9/10 in intensity, constant pain, described as sharp in character. He could not think of any precipitating factors and there are no aggravating or relieving factors.  Past medical history:  Past Medical History   Diagnosis  Date   .  Hypertension    .  CVA (cerebral infarction)    .  Colitis  12/16/2010     Vasculitis   .  Renal disorder    .  Sepsis    .  Vascular insufficiency      Consultants: Gen Surgery  Procedures: None  Subjective: Patient still with left sided pain which is 7/10. Says the pain medication don't last long. Denies any nausea. Had 2 loose stools last night.  Objective: Vital signs in last 24 hours: Temp:  [97.5 F (36.4 C)-98.6 F (37 C)] 98 F (36.7 C) (03/30 0845) Pulse Rate:  [73-89] 89  (03/30 0845) Resp:  [11-20] 18  (03/30 0845) BP: (105-149)/(74-96) 105/74 mmHg (03/30 0845) SpO2:  [94 %-100 %] 97 % (03/30 0845) Weight:  [56.5 kg (124 lb 9 oz)] 56.5 kg (124 lb 9 oz) (03/29 2040) Weight change:  Last BM Date: 05/20/11  Intake/Output from previous day: 03/29 0701 - 03/30 0700 In: 1243.3 [I.V.:1043.3; IV  Piggyback:200] Out: 652 [Urine:650; Stool:2] Intake/Output this shift:    General appearance: alert, cooperative, appears stated age and no distress Head: Normocephalic, without obvious abnormality, atraumatic Eyes: conjunctivae/corneas clear. PERRL, EOM's intact.  Resp: clear to auscultation bilaterally Cardio: regular rate and rhythm, S1, S2 normal, no murmur, click, rub or gallop GI: Soft, tender in left lower quadrant, no rigidity, some guarding. BS sluggish. No masses or organomegaly. Extremities: extremities normal, atraumatic, no cyanosis or edema Pulses: 2+ and symmetric Skin: Skin color, texture, turgor normal. No rashes or lesions Neurologic: Grossly normal  Lab Results:  Basename 05/21/11 0700 05/20/11 1339 05/20/11 1314  WBC 8.7 -- 7.7  HGB 8.3* 8.5* --  HCT 26.0* 25.0* --  PLT 262 -- 265   BMET  Basename 05/21/11 0700 05/20/11 1339 05/20/11 1314  NA 136 139 --  K 4.2 4.3 --  CL 106 106 --  CO2 20 -- 22  GLUCOSE 60* 76 --  BUN 13 14 --  CREATININE 1.19 1.50* --  CALCIUM 8.4 -- 8.9  ALT 14 -- 15    Studies/Results: US Abdomen Complete  05/20/2011  *RADIOLOGY REPORT*  Clinical Data:  Abdominal pain.  History of hemicolectomy.  COMPLETE ABDOMINAL ULTRASOUND  Comparison:  04/19/2011.  Findings:  Gallbladder:  Positive sonographic Murphy's sign, wall thickening and pericholecystic fluid compatible with acute cholecystitis. Echogenic foci within  thegallbladder are atypical for ordinary sludge or stones and probably represents tumefactive sludge, hemorrhage  or sloughing of the gallbladder wall. There is no sonographic evidence of emphysematous cholecystitis.  Common bile duct:  Mildly dilated for age at 9 mm.  If cholecystectomy is performed, intraoperative cholangiogram is recommended.  Liver:  No focal lesion identified.  Within normal limits in parenchymal echogenicity.  IVC:  Appears normal.  Pancreas:  No focal abnormality seen.  Spleen:  68 mm.  Normal echotexture.   Right Kidney:  Echogenic cortex, likely representing medical renal disease.  11.5 cm long axis.  Normal central sinus echo complex.  Left Kidney:  Echogenic cortex, likely representing medical renal disease.  11.7 cm.  Normal central sinus echo complex.  Abdominal aorta:  Suboptimal visualization of the mid abdominal aorta.  Mild fusiform dilation of the distal abdominal aorta on the prior CT.  Abdominal aortic atherosclerosis.  IMPRESSION: 1.Gallbladder wall thickening, pericholecystic fluid and positive sonographic Murphy's sign with echogenic foci in the gallbladder consistent with acute cholecystitis. Mild dilation of the common bile duct for age without common duct stone identified.  If cholecystectomy performed, intraoperative cholangiogram is recommended. 2.  Echogenic renal cortex compatible with medical renal disease. 3.  Abdominal aortic atherosclerosis.  Original Report Authenticated By: Andreas Newport, M.D.   Ct Abdomen Pelvis W Contrast  05/20/2011  *RADIOLOGY REPORT*  Clinical Data: Left lower quadrant pain  CT ABDOMEN AND PELVIS WITH CONTRAST  Technique:  Multidetector CT imaging of the abdomen and pelvis was performed following the standard protocol during bolus administration of intravenous contrast.  Contrast: 80mL OMNIPAQUE IOHEXOL 300 MG/ML IJ SOLN 80 ml Omnipaque- 300 IV  Comparison: [facility] CT abdomen pelvis dated 04/19/2011  Findings: Dependent atelectasis in the bilateral lower lobes.  Liver is notable for periportal edema.  Suspected cystic lesion in the right hepatic dome is not well visualized on the current study (series 2/image 13).  Spleen, pancreas, and adrenal glands are within normal limits.  Gallbladder is notable for gallbladder wall edema and gallbladder sludge (series 2/image 37).  Kidneys are within normal limits.  No hydronephrosis.  No evidence of bowel obstruction.  Status post right hemicolectomy. Colonic wall thickening, most prominent in the distal sigmoid colon  (series 2/image 64), suspicious for infectious/inflammatory colitis such as pseudomembranous colitis.  Atherosclerotic calcifications of the abdominal aorta and branch vessels.  Diffuse mesenteric edema.  No free air.  Small retroperitoneal lymph nodes which do not meet pathologic CT size criteria.  Prostate is unremarkable.  Bladder is within normal limits.  Degenerative changes of the visualized thoracolumbar spine.  Stable superior endplate changes at L4.  Stable fusion at L5-S1.  IMPRESSION: Status post right hemicolectomy.  Colonic wall thickening, most prominent in the distal sigmoid colon, suspicious for infectious/inflammatory colitis such as pseudomembranous colitis.  Diffuse mesenteric and periportal, likely secondary to hypoalbuminemia.  Gallbladder sludge.  Gallbladder wall edema, favored to be secondary to hypoalbuminemia, although acute cholecystitis cannot be entirely excluded.  Original Report Authenticated By: Charline Bills, M.D.   US Renal  05/19/2011  *RADIOLOGY REPORT*  Clinical Data:  kidneys stones  RENAL/URINARY TRACT ULTRASOUND COMPLETE  Comparison:  Ultrasound 12/20/2010 and CT scan 04/19/2011  Findings:  Right Kidney:  Measures 11 cm in length.  No hydronephrosis or diagnostic renal calculus.  Again noted cortical increased echogenicity.  Left Kidney:  Measures 11.8 cm in length.  Again noted cortical increased echogenicity.  No hydronephrosis or diagnostic renal calculus.  Bladder:  No bladder filling  defects are identified.  There is abnormal appearance of visualized gallbladder. Heterogeneous irregular filling defect is noted within gallbladder. This may represent sludge ball or gallbladder wall polyp.  There is no sonographic Murphy's sign.  IMPRESSION:  1.  No hydronephrosis or diagnostic renal calculus.  Again noted bilateral renal cortical increased echogenicity due to chronic medical renal disease. 2.  Abnormal appearance of the gallbladder with a heterogeneous irregular  filling defect within gallbladder.  This may represent gallbladder polyp or sludge ball.  Clinical correlation is necessary.  Further evaluation with CT scan or MRCP could be performed as clinically warranted.  Original Report Authenticated By: Natasha Mead, M.D.    Medications:  Scheduled:   . amLODipine  5 mg Oral Daily  . ciprofloxacin  400 mg Intravenous Q12H  . fentaNYL  25 mcg Transdermal Q72H  . folic acid  1 mg Oral Daily  .  HYDROmorphone (DILAUDID) injection  1 mg Intravenous Once  .  HYDROmorphone (DILAUDID) injection  1 mg Intravenous Once  . iohexol  20 mL Oral Q1 Hr x 2  . metoprolol tartrate  12.5 mg Oral BID  . metronidazole  500 mg Intravenous Once  . metroNIDAZOLE  500 mg Oral Q8H  . mulitivitamin with minerals  1 tablet Oral Daily  . ondansetron (ZOFRAN) IV  4 mg Intravenous Once  . saccharomyces boulardii  250 mg Oral BID  . sodium chloride  1,000 mL Intravenous Once  . sodium chloride  3 mL Intravenous Q12H  . warfarin  10 mg Oral NOW  . Warfarin - Pharmacist Dosing Inpatient   Does not apply q1800  . DISCONTD: levofloxacin (LEVAQUIN) IV  750 mg Intravenous To [facility]    Assessment/Plan:  Principal Problem:  *Colitis Active Problems:  HTN (hypertension)  Diarrhea  H/O: CVA (cardiovascular accident)  Acute abdominal pain  Anemia  Long term current use of anticoagulant  Abnormal gallbladder ultrasound  Sacral decubitus ulcer, stage II  Malnutrition, calorie    #1 abdominal pain with diarrhea: CT scan suggested colitis in the sigmoid colon. Patient is positive for c-diff this admission as well. He was recently treated for C. Difficile Colitis back in February. He was treated for 2 weeks with Flagyl. So this episode will be considered a first relapse. Continue with Flagyl (started 3/29). Continue Florastor. Discontinue Cipro. Surgery is following. Increase pain medication.  #2 Abnormal gallbladder on [place] and CT: Apparently this is a chronic finding for him per  surgery. They do not believe he has cholecystitis.   #3 long-term anticoagulation: The reason for his warfarin is not entirely clear. Review of his previous records did not give a clear indication. We will need to pursue this further by contacting his PCP. Patient does not know. In the, meantime, continue with warfarin unless the patient undergo surgery.   #4 anemia: He does have chronic anemia. His blood counts will be monitored closely. He had an anemia panel recently in February, so, one would not be repeated.   #5 stage II sacral decubitus ulcer: Wound care will be consulted.   #6 protein calorie malnutrition: Advance to clear liquids. Start Ensure once he is tolerating clears.  #7 history of stroke is stable.   #8 history of hypertension. BP is stable. Monitor him closely.   DVT, prophylaxis. He is on therapeutic anticoagulation.   He is a full code     LOS: 1 day   [facility] Pager [phone] 05/21/2011, 9:36 AM

## 2011-05-21 NOTE — Progress Notes (Signed)
General Surgery Note  LOS: 1 day   Assessment/Plan: 1.  Colitis - primarily distal sigmoid colon - infectious vs ischemic  C. Difficile - pending  (Right colectomy - 12/18/2010 - B. Brian [name] at [facility] for ileitis/ischemia)  Symptoms correlate with CT scan findings.  Okay from my standpoint to give patient liquids.  He is afraid to eat because of his frequent stools.  2.  HTN (hypertension)  3.  H/O: CVA (cardiovascular accident) - left sided weakness, walks with a walker. 4.  Anemia - Hgb: 8.3 - 05/21/2011.   5.  Long term current use of anticoagulant   INR - 1.81 - 05/20/2011 6.  Abnormal gallbladder ultrasound   These changes have shown up in old CT scans and no RUQ symptoms.    7.  Sacral decubitus ulcer, stage II  8.  Malnutrition, calorie    Albumin - 2.0 - 05/20/2011  Subjective:  Complains of LLQ abdominal pain. Objective:   Filed Vitals:   05/21/11 0546  BP: 143/86  Pulse: 85  Temp: 98.6 F (37 C)  Resp: 19     Intake/Output from previous day:  03/29 0701 - 03/30 0700 In: -  Out: 652 [Urine:650; Stool:2]  Intake/Output this shift:      Physical Exam:   General: Older AA M who is alert.    HEENT: Normal. Pupils equal. Poor dentition. .   Lungs: Clear   Abdomen: Old midline scar.  Sore/tender LLQ.  BS present.   Wound: Sacral decubiti okay.   Neurologic:  Weak left arm/leg.   Psychiatric: Has normal mood and affect. Behavior is normal   Lab Results:    Basename 05/21/11 0700 05/20/11 1339 05/20/11 1314  WBC 8.7 -- 7.7  HGB 8.3* 8.5* --  HCT 26.0* 25.0* --  PLT 262 -- 265    BMET   Basename 05/20/11 1339 05/20/11 1314  NA 139 135  K 4.3 4.3  CL 106 106  CO2 -- 22  GLUCOSE 76 73  BUN 14 14  CREATININE 1.50* 1.30  CALCIUM -- 8.9    PT/INR   Basename 05/20/11 1658  LABPROT 21.3*  INR 1.81*    ABG  No results found for this basename: PHART:2,PCO2:2,PO2:2,HCO3:2 in the last 72 hours   Studies/Results:  US Abdomen Complete  05/20/2011   *RADIOLOGY REPORT*  Clinical Data:  Abdominal pain.  History of hemicolectomy.  COMPLETE ABDOMINAL ULTRASOUND  Comparison:  04/19/2011.  Findings:  Gallbladder:  Positive sonographic Murphy's sign, wall thickening and pericholecystic fluid compatible with acute cholecystitis. Echogenic foci within thegallbladder are atypical for ordinary sludge or stones and probably represents tumefactive sludge, hemorrhage  or sloughing of the gallbladder wall. There is no sonographic evidence of emphysematous cholecystitis.  Common bile duct:  Mildly dilated for age at 9 mm.  If cholecystectomy is performed, intraoperative cholangiogram is recommended.  Liver:  No focal lesion identified.  Within normal limits in parenchymal echogenicity.  IVC:  Appears normal.  Pancreas:  No focal abnormality seen.  Spleen:  68 mm.  Normal echotexture.  Right Kidney:  Echogenic cortex, likely representing medical renal disease.  11.5 cm long axis.  Normal central sinus echo complex.  Left Kidney:  Echogenic cortex, likely representing medical renal disease.  11.7 cm.  Normal central sinus echo complex.  Abdominal aorta:  Suboptimal visualization of the mid abdominal aorta.  Mild fusiform dilation of the distal abdominal aorta on the prior CT.  Abdominal aortic atherosclerosis.  IMPRESSION: 1.Gallbladder wall thickening, pericholecystic fluid  and positive sonographic Murphy's sign with echogenic foci in the gallbladder consistent with acute cholecystitis. Mild dilation of the common bile duct for age without common duct stone identified.  If cholecystectomy performed, intraoperative cholangiogram is recommended. 2.  Echogenic renal cortex compatible with medical renal disease. 3.  Abdominal aortic atherosclerosis.  Original Report Authenticated By: Andreas Newport, M.D.   Ct Abdomen Pelvis W Contrast  05/20/2011  *RADIOLOGY REPORT*  Clinical Data: Left lower quadrant pain  CT ABDOMEN AND PELVIS WITH CONTRAST  Technique:  Multidetector CT  imaging of the abdomen and pelvis was performed following the standard protocol during bolus administration of intravenous contrast.  Contrast: 80mL OMNIPAQUE IOHEXOL 300 MG/ML IJ SOLN 80 ml Omnipaque- 300 IV  Comparison: [facility] CT abdomen pelvis dated 04/19/2011  Findings: Dependent atelectasis in the bilateral lower lobes.  Liver is notable for periportal edema.  Suspected cystic lesion in the right hepatic dome is not well visualized on the current study (series 2/image 13).  Spleen, pancreas, and adrenal glands are within normal limits.  Gallbladder is notable for gallbladder wall edema and gallbladder sludge (series 2/image 37).  Kidneys are within normal limits.  No hydronephrosis.  No evidence of bowel obstruction.  Status post right hemicolectomy. Colonic wall thickening, most prominent in the distal sigmoid colon (series 2/image 64), suspicious for infectious/inflammatory colitis such as pseudomembranous colitis.  Atherosclerotic calcifications of the abdominal aorta and branch vessels.  Diffuse mesenteric edema.  No free air.  Small retroperitoneal lymph nodes which do not meet pathologic CT size criteria.  Prostate is unremarkable.  Bladder is within normal limits.  Degenerative changes of the visualized thoracolumbar spine.  Stable superior endplate changes at L4.  Stable fusion at L5-S1.  IMPRESSION: Status post right hemicolectomy.  Colonic wall thickening, most prominent in the distal sigmoid colon, suspicious for infectious/inflammatory colitis such as pseudomembranous colitis.  Diffuse mesenteric and periportal, likely secondary to hypoalbuminemia.  Gallbladder sludge.  Gallbladder wall edema, favored to be secondary to hypoalbuminemia, although acute cholecystitis cannot be entirely excluded.  Original Report Authenticated By: Charline Bills, M.D.   US Renal  05/19/2011  *RADIOLOGY REPORT*  Clinical Data:  kidneys stones  RENAL/URINARY TRACT ULTRASOUND COMPLETE  Comparison:  Ultrasound  12/20/2010 and CT scan 04/19/2011  Findings:  Right Kidney:  Measures 11 cm in length.  No hydronephrosis or diagnostic renal calculus.  Again noted cortical increased echogenicity.  Left Kidney:  Measures 11.8 cm in length.  Again noted cortical increased echogenicity.  No hydronephrosis or diagnostic renal calculus.  Bladder:  No bladder filling defects are identified.  There is abnormal appearance of visualized gallbladder. Heterogeneous irregular filling defect is noted within gallbladder. This may represent sludge ball or gallbladder wall polyp.  There is no sonographic Murphy's sign.  IMPRESSION:  1.  No hydronephrosis or diagnostic renal calculus.  Again noted bilateral renal cortical increased echogenicity due to chronic medical renal disease. 2.  Abnormal appearance of the gallbladder with a heterogeneous irregular filling defect within gallbladder.  This may represent gallbladder polyp or sludge ball.  Clinical correlation is necessary.  Further evaluation with CT scan or MRCP could be performed as clinically warranted.  Original Report Authenticated By: Natasha Mead, M.D.     Anti-infectives:   Anti-infectives     Start     Dose/Rate Route Frequency Ordered Stop   05/20/11 2200   metroNIDAZOLE (FLAGYL) tablet 500 mg        500 mg Oral 3 times per day 05/20/11 4540  05/20/11 2000   ciprofloxacin (CIPRO) IVPB 400 mg        400 mg 200 mL/hr over 60 Minutes Intravenous Every 12 hours 05/20/11 1915     05/20/11 1800   levofloxacin (LEVAQUIN) IVPB 750 mg  Status:  Discontinued        750 mg 100 mL/hr over 90 Minutes Intravenous To [facility] 05/20/11 1716 05/20/11 1915   05/20/11 1730   metroNIDAZOLE (FLAGYL) IVPB 500 mg        500 mg 100 mL/hr over 60 Minutes Intravenous  Once 05/20/11 1716 05/20/11 1826          Ovidio Kin, MD, FACS Pager: [phone],   [facility] Office: [phone] 05/21/2011

## 2011-05-21 NOTE — Progress Notes (Signed)
ANTICOAGULATION CONSULT NOTE - Follow Up Consult  Pharmacy Consult for Coumadin Indication: ? history of CVA, MD contacting PCP for indication   No Known Allergies  Patient Measurements: Height: 5\' 7"  (170.2 cm) Weight: 124 lb 9 oz (56.5 kg) IBW/kg (Calculated) : 66.1   Vital Signs: Temp: 98 F (36.7 C) (03/30 0845) Temp src: Oral (03/30 0845) BP: 105/74 mmHg (03/30 0845) Pulse Rate: 89  (03/30 0845)  Labs:  Basename 05/21/11 0700 05/20/11 1658 05/20/11 1339 05/20/11 1314  HGB 8.3* -- 8.5* --  HCT 26.0* -- 25.0* 26.6*  PLT 262 -- -- 265  APTT -- -- -- --  LABPROT 24.6* 21.3* -- --  INR 2.18* 1.81* -- --  HEPARINUNFRC -- -- -- --  CREATININE 1.19 -- 1.50* 1.30  CKTOTAL -- -- -- --  CKMB -- -- -- --  TROPONINI -- -- -- --   Estimated Creatinine Clearance: 50.1 ml/min (by C-G formula based on Cr of 1.19).  Medications:  Scheduled:    . amLODipine  5 mg Oral Daily  . fentaNYL  25 mcg Transdermal Q72H  . folic acid  1 mg Oral Daily  .  HYDROmorphone (DILAUDID) injection  1 mg Intravenous Once  .  HYDROmorphone (DILAUDID) injection  1 mg Intravenous Once  . iohexol  20 mL Oral Q1 Hr x 2  . metoprolol tartrate  12.5 mg Oral BID  . metronidazole  500 mg Intravenous Once  . metroNIDAZOLE  500 mg Oral Q8H  . mulitivitamin with minerals  1 tablet Oral Daily  . ondansetron (ZOFRAN) IV  4 mg Intravenous Once  . saccharomyces boulardii  250 mg Oral BID  . sodium chloride  1,000 mL Intravenous Once  . sodium chloride  3 mL Intravenous Q12H  . warfarin  10 mg Oral NOW  . Warfarin - Pharmacist Dosing Inpatient   Does not apply q1800  . DISCONTD: ciprofloxacin  400 mg Intravenous Q12H  . DISCONTD: levofloxacin (LEVAQUIN) IV  750 mg Intravenous To Major   Infusions:    . sodium chloride 100 mL/hr at 05/21/11 0700  . DISCONTD: sodium chloride 125 mL/hr (05/20/11 1659)   PRN: acetaminophen, acetaminophen, albuterol, HYDROmorphone, iohexol, ondansetron (ZOFRAN) IV,  ondansetron, oxyCODONE, DISCONTD: HYDROmorphone  Assessment: 66 y.o. male presents from NH with abd pain now positive for cdiff. Pt on coumadin PTA for ?indication - perhaps h/o CVA. MD to call PCP and find out indication. INR therapeutic at 2.18. Patient has a history of anemia. No bleeding noted in chart.   Noted patient started on flagyl with can increase INR. Will monitor closely.   Goal of Therapy:  INR 2-3   Plan:  1. Coumadin 8mg  po x 1 today  2. F/u INR in the AM  Thank you,  Brett Fairy, PharmD Pager: [phone]  05/21/2011 10:56 AM

## 2011-05-22 LAB — BASIC METABOLIC PANEL
BUN: 9 mg/dL (ref 6–23)
Calcium: 8.4 mg/dL (ref 8.4–10.5)
GFR calc non Af Amer: 63 mL/min — ABNORMAL LOW (ref >90)
Glucose, Bld: 88 mg/dL (ref 70–99)
Sodium: 137 mEq/L (ref 135–145)

## 2011-05-22 LAB — CBC
Hemoglobin: 8.1 g/dL — ABNORMAL LOW (ref 13.0–17.0)
MCH: 29.2 pg (ref 26.0–34.0)
MCHC: 32.5 g/dL (ref 30.0–36.0)

## 2011-05-22 LAB — PROTIME-INR
INR: 3.47 — ABNORMAL HIGH (ref 0.00–1.49)
Prothrombin Time: 35.4 seconds — ABNORMAL HIGH (ref 11.6–15.2)

## 2011-05-22 MED ORDER — KCL IN DEXTROSE-NACL 20-5-0.9 MEQ/L-%-% IV SOLN
INTRAVENOUS | Status: DC
  Administered 2011-05-22 (×2): via INTRAVENOUS
  Filled 2011-05-22 (×5): qty 1000

## 2011-05-22 MED ORDER — ENSURE COMPLETE PO LIQD
237.0000 mL | Freq: Three times a day (TID) | ORAL | Status: DC
  Administered 2011-05-22 – 2011-05-23 (×2): 237 mL via ORAL

## 2011-05-22 MED ORDER — HYDROCODONE-ACETAMINOPHEN 5-325 MG PO TABS
1.0000 | ORAL_TABLET | ORAL | Status: DC | PRN
  Administered 2011-05-22 – 2011-05-24 (×6): 2 via ORAL
  Filled 2011-05-22 (×2): qty 2
  Filled 2011-05-22: qty 1
  Filled 2011-05-22 (×3): qty 2

## 2011-05-22 NOTE — Progress Notes (Signed)
PCP: Josue Hector, MD, MD  Brief HPI:  This is a 66 year old, African American male, who presented to the hospital with 2-3 day history of left lower abdominal pain, radiating to the back. Patient has a past medical history of hypertension, and a previous history of stroke. He tells that the abdominal pain was associated with diarrhea, which was small in quantity, but watery. He had multiple episodes over the course of 2-3 days, but hasn't had any episodes on the day of admission. He also mentioned, that he's had a lot of pain while having a bowel movement. Had some nausea, but denied any emesis. Denied any blood in the stool. Denied any fever. Patient was somewhat of a poor historian. Initially, he told me the pain was in the left side but then he also mentioned that he had some pain in the right side as well. The pain is 9/10 in intensity, constant pain, described as sharp in character. He could not think of any precipitating factors and there are no aggravating or relieving factors.  Past medical history:  Past Medical History   Diagnosis  Date   .  Hypertension    .  CVA (cerebral infarction)    .  Colitis  12/16/2010     Vasculitis   .  Renal disorder    .  Sepsis    .  Vascular insufficiency      Consultants: Gen Surgery has signed off  Procedures: None  Subjective: Patient feels better. Pain is better. No nausea. 3 BM's since yesterday. Small quantity. Tolerating liquids.  Objective: Vital signs in last 24 hours: Temp:  [97.9 F (36.6 C)-98.5 F (36.9 C)] 98.5 F (36.9 C) (03/31 0855) Pulse Rate:  [75-87] 87  (03/31 0855) Resp:  [18-20] 18  (03/31 0855) BP: (110-131)/(71-85) 110/76 mmHg (03/31 0855) SpO2:  [96 %-100 %] 96 % (03/31 0855) Weight:  [56.5 kg (124 lb 9 oz)] 56.5 kg (124 lb 9 oz) (03/30 2158) Weight change: 0 kg (0 lb) Last BM Date: 05/21/11  Intake/Output from previous day: 03/30 0701 - 03/31 0700 In: 3081.7 [P.O.:690; I.V.:2391.7] Out: 175  [Urine:175] Intake/Output this shift: Total I/O In: 240 [P.O.:240] Out: -   General appearance: alert, cooperative, appears stated age and no distress Resp: clear to auscultation bilaterally Cardio: regular rate and rhythm, S1, S2 normal, no murmur, click, rub or gallop GI: Soft, mildly tender in left lower quadrant, no rigidity, some guarding. BS sluggish. No masses or organomegaly. Neurologic: Grossly normal  Lab Results:  Basename 05/22/11 0653 05/21/11 0700  WBC 5.1 8.7  HGB 8.1* 8.3*  HCT 24.9* 26.0*  PLT 246 262   BMET  Basename 05/22/11 0653 05/21/11 0700 05/20/11 1314  NA 137 136 --  K 3.8 4.2 --  CL 108 106 --  CO2 23 20 --  GLUCOSE 88 60* --  BUN 9 13 --  CREATININE 1.18 1.19 --  CALCIUM 8.4 8.4 --  ALT -- 14 15    Studies/Results: US Abdomen Complete  05/20/2011  *RADIOLOGY REPORT*  Clinical Data:  Abdominal pain.  History of hemicolectomy.  COMPLETE ABDOMINAL ULTRASOUND  Comparison:  04/19/2011.  Findings:  Gallbladder:  Positive sonographic Murphy's sign, wall thickening and pericholecystic fluid compatible with acute cholecystitis. Echogenic foci within thegallbladder are atypical for ordinary sludge or stones and probably represents tumefactive sludge, hemorrhage  or sloughing of the gallbladder wall. There is no sonographic evidence of emphysematous cholecystitis.  Common bile duct:  Mildly dilated for age at  9 mm.  If cholecystectomy is performed, intraoperative cholangiogram is recommended.  Liver:  No focal lesion identified.  Within normal limits in parenchymal echogenicity.  IVC:  Appears normal.  Pancreas:  No focal abnormality seen.  Spleen:  68 mm.  Normal echotexture.  Right Kidney:  Echogenic cortex, likely representing medical renal disease.  11.5 cm long axis.  Normal central sinus echo complex.  Left Kidney:  Echogenic cortex, likely representing medical renal disease.  11.7 cm.  Normal central sinus echo complex.  Abdominal aorta:  Suboptimal  visualization of the mid abdominal aorta.  Mild fusiform dilation of the distal abdominal aorta on the prior CT.  Abdominal aortic atherosclerosis.  IMPRESSION: 1.Gallbladder wall thickening, pericholecystic fluid and positive sonographic Murphy's sign with echogenic foci in the gallbladder consistent with acute cholecystitis. Mild dilation of the common bile duct for age without common duct stone identified.  If cholecystectomy performed, intraoperative cholangiogram is recommended. 2.  Echogenic renal cortex compatible with medical renal disease. 3.  Abdominal aortic atherosclerosis.  Original Report Authenticated By: Andreas Newport, M.D.   Ct Abdomen Pelvis W Contrast  05/20/2011  *RADIOLOGY REPORT*  Clinical Data: Left lower quadrant pain  CT ABDOMEN AND PELVIS WITH CONTRAST  Technique:  Multidetector CT imaging of the abdomen and pelvis was performed following the standard protocol during bolus administration of intravenous contrast.  Contrast: 80mL OMNIPAQUE IOHEXOL 300 MG/ML IJ SOLN 80 ml Omnipaque- 300 IV  Comparison: [facility] CT abdomen pelvis dated 04/19/2011  Findings: Dependent atelectasis in the bilateral lower lobes.  Liver is notable for periportal edema.  Suspected cystic lesion in the right hepatic dome is not well visualized on the current study (series 2/image 13).  Spleen, pancreas, and adrenal glands are within normal limits.  Gallbladder is notable for gallbladder wall edema and gallbladder sludge (series 2/image 37).  Kidneys are within normal limits.  No hydronephrosis.  No evidence of bowel obstruction.  Status post right hemicolectomy. Colonic wall thickening, most prominent in the distal sigmoid colon (series 2/image 64), suspicious for infectious/inflammatory colitis such as pseudomembranous colitis.  Atherosclerotic calcifications of the abdominal aorta and branch vessels.  Diffuse mesenteric edema.  No free air.  Small retroperitoneal lymph nodes which do not meet pathologic CT  size criteria.  Prostate is unremarkable.  Bladder is within normal limits.  Degenerative changes of the visualized thoracolumbar spine.  Stable superior endplate changes at L4.  Stable fusion at L5-S1.  IMPRESSION: Status post right hemicolectomy.  Colonic wall thickening, most prominent in the distal sigmoid colon, suspicious for infectious/inflammatory colitis such as pseudomembranous colitis.  Diffuse mesenteric and periportal, likely secondary to hypoalbuminemia.  Gallbladder sludge.  Gallbladder wall edema, favored to be secondary to hypoalbuminemia, although acute cholecystitis cannot be entirely excluded.  Original Report Authenticated By: Charline Bills, M.D.    Medications:  Scheduled:    . amLODipine  5 mg Oral Daily  . fentaNYL  25 mcg Transdermal Q72H  . folic acid  1 mg Oral Daily  . metoprolol tartrate  12.5 mg Oral BID  . metroNIDAZOLE  500 mg Oral Q8H  . mulitivitamin with minerals  1 tablet Oral Daily  . saccharomyces boulardii  250 mg Oral BID  . sodium chloride  3 mL Intravenous Q12H  . warfarin  8 mg Oral ONCE-1800  . Warfarin - Pharmacist Dosing Inpatient   Does not apply q1800  . DISCONTD: ciprofloxacin  400 mg Intravenous Q12H    Assessment/Plan:  Principal Problem:  *Colitis Active Problems:  HTN (hypertension)  Diarrhea  H/O: CVA (cardiovascular accident)  Acute abdominal pain  Anemia  Long term current use of anticoagulant  Abnormal gallbladder ultrasound  Sacral decubitus ulcer, stage II  Malnutrition, calorie    #1 C-diff colitis: He was recently treated for C. Difficile Colitis back in February. He was treated for 2 weeks with Flagyl. So this episode will be considered a first relapse. Continue with Flagyl (started 3/29). Continue Florastor. Transition to oral pain meds. Advance diet.  #2 Abnormal gallbladder on [place] and CT: Apparently this is a chronic finding for him per surgery. They do not believe he has cholecystitis.   #3 long-term  anticoagulation: The reason for his warfarin is not entirely clear. Review of his previous records did not give a clear indication. We will need to pursue this further by contacting his PCP. Patient does not know. In the, meantime, continue with warfarin unless the patient undergo surgery.   #4 anemia: Stable. He does have chronic anemia. His blood counts will be monitored closely. He had an anemia panel recently in February, so, one would not be repeated.   #5 stage II sacral decubitus ulcer: Wound care.   #6 protein calorie malnutrition: Advance to full liquids. Start Ensure once he is tolerating clears.  #7 history of stroke is stable.   #8 history of hypertension. BP is stable. Monitor him closely.   DVT, prophylaxis. He is on therapeutic anticoagulation.   He is a full code   Disposition: Back to SNF when better. Hopefully in 1-2 days.    LOS: 2 days   [facility] Pager [phone] 05/22/2011, 9:07 AM

## 2011-05-22 NOTE — Progress Notes (Signed)
ANTICOAGULATION CONSULT NOTE - Follow Up Consult  Pharmacy Consult for Coumadin Indication: ? history of CVA, MD contacting PCP for indication   No Known Allergies  Patient Measurements: Height: 5\' 7"  (170.2 cm) Weight: 124 lb 9 oz (56.5 kg) IBW/kg (Calculated) : 66.1   Vital Signs: Temp: 98.5 F (36.9 C) (03/31 0855) Temp src: Oral (03/31 0855) BP: 110/76 mmHg (03/31 0855) Pulse Rate: 87  (03/31 0855)  Labs:  Basename 05/22/11 0653 05/21/11 0700 05/20/11 1658 05/20/11 1339 05/20/11 1314  HGB 8.1* 8.3* -- -- --  HCT 24.9* 26.0* -- 25.0* --  PLT 246 262 -- -- 265  APTT -- -- -- -- --  LABPROT 35.4* 24.6* 21.3* -- --  INR 3.47* 2.18* 1.81* -- --  HEPARINUNFRC -- -- -- -- --  CREATININE 1.18 1.19 -- 1.50* --  CKTOTAL -- -- -- -- --  CKMB -- -- -- -- --  TROPONINI -- -- -- -- --   Estimated Creatinine Clearance: 50.5 ml/min (by C-G formula based on Cr of 1.18).  Medications:  Scheduled:     . amLODipine  5 mg Oral Daily  . feeding supplement  237 mL Oral TID BM  . fentaNYL  25 mcg Transdermal Q72H  . folic acid  1 mg Oral Daily  . metoprolol tartrate  12.5 mg Oral BID  . metroNIDAZOLE  500 mg Oral Q8H  . mulitivitamin with minerals  1 tablet Oral Daily  . saccharomyces boulardii  250 mg Oral BID  . sodium chloride  3 mL Intravenous Q12H  . warfarin  8 mg Oral ONCE-1800  . Warfarin - Pharmacist Dosing Inpatient   Does not apply q1800   Infusions:     . dextrose 5 % and 0.9 % NaCl with KCl 20 mEq/L 80 mL/hr at 05/22/11 0908  . DISCONTD: sodium chloride 100 mL/hr at 05/22/11 0607   PRN: acetaminophen, acetaminophen, albuterol, HYDROcodone-acetaminophen, HYDROmorphone, ondansetron (ZOFRAN) IV, ondansetron, DISCONTD: oxyCODONE  Assessment: 66 y.o. male presents from NH with abd pain now positive for cdiff. Pt on coumadin PTA for ?indication - perhaps h/o CVA. MD to call PCP and find out indication.   INR jump from 2.18 to 3.47 today. No bleeding noted and CBC  remains stable. INR increase likely d/t flagyl.   Goal of Therapy:  INR 2-3   Plan:  1. No coumadin today - monitor closely on flagyl.  2. F/u INR in the AM  Thank you,  Brett Fairy, PharmD Pager: [phone]  05/22/2011 11:02 AM

## 2011-05-22 NOTE — Progress Notes (Signed)
General Surgery Note  LOS: 2 days   Assessment/Plan: 1.  Colitis - primarily distal sigmoid colon - C. Difficile  C. Difficile - positive, WBC - 5,100  (Right colectomy - 12/18/2010 - B. Leticia Penna at [facility] for ileitis/ischemia)  May advance diet.  No acute surgical issues, will sign off.  2.  HTN (hypertension)  3.  H/O: CVA (cardiovascular accident) - left sided weakness, walks with a walker. 4.  Anemia - Hgb: 8.1 - 05/22/2011.   5.  Long term current use of anticoagulant   INR - 2.18 - 05/22/2011 6.  Abnormal gallbladder ultrasound   These changes have shown up in old CT scans and no RUQ symptoms.    7.  Sacral decubitus ulcer, stage II  8.  Malnutrition, calorie    Albumin - 2.0 - 05/20/2011  Subjective:  Abdominal pain better. Wants some grits.  Diarrhea better, had one loose stool this AM. Objective:   Filed Vitals:   05/22/11 0515  BP: 124/80  Pulse: 75  Temp: 98.5 F (36.9 C)  Resp: 18     Intake/Output from previous day:  03/30 0701 - 03/31 0700 In: 3081.7 [P.O.:690; I.V.:2391.7] Out: 175 [Urine:175]  Intake/Output this shift:      Physical Exam:   General: Older AA M who is alert.    HEENT: Normal. Pupils equal. Poor dentition. .   Lungs: Clear   Abdomen: Old midline scar.  Less tender LLQ.  BS present.   Wound: Sacral decubiti okay.   Neurologic:  Weak left arm/leg.   Psychiatric: Has normal mood and affect. Behavior is normal   Lab Results:     Basename 05/22/11 0653 05/21/11 0700  WBC 5.1 8.7  HGB 8.1* 8.3*  HCT 24.9* 26.0*  PLT 246 262    BMET    Basename 05/21/11 0700 05/20/11 1339 05/20/11 1314  NA 136 139 --  K 4.2 4.3 --  CL 106 106 --  CO2 20 -- 22  GLUCOSE 60* 76 --  BUN 13 14 --  CREATININE 1.19 1.50* --  CALCIUM 8.4 -- 8.9    PT/INR    Basename 05/21/11 0700 05/20/11 1658  LABPROT 24.6* 21.3*  INR 2.18* 1.81*    ABG  No results found for this basename: PHART:2,PCO2:2,PO2:2,HCO3:2 in the last 72  hours   Studies/Results:  US Abdomen Complete  05/20/2011  *RADIOLOGY REPORT*  Clinical Data:  Abdominal pain.  History of hemicolectomy.  COMPLETE ABDOMINAL ULTRASOUND  Comparison:  04/19/2011.  Findings:  Gallbladder:  Positive sonographic Murphy's sign, wall thickening and pericholecystic fluid compatible with acute cholecystitis. Echogenic foci within thegallbladder are atypical for ordinary sludge or stones and probably represents tumefactive sludge, hemorrhage  or sloughing of the gallbladder wall. There is no sonographic evidence of emphysematous cholecystitis.  Common bile duct:  Mildly dilated for age at 9 mm.  If cholecystectomy is performed, intraoperative cholangiogram is recommended.  Liver:  No focal lesion identified.  Within normal limits in parenchymal echogenicity.  IVC:  Appears normal.  Pancreas:  No focal abnormality seen.  Spleen:  68 mm.  Normal echotexture.  Right Kidney:  Echogenic cortex, likely representing medical renal disease.  11.5 cm long axis.  Normal central sinus echo complex.  Left Kidney:  Echogenic cortex, likely representing medical renal disease.  11.7 cm.  Normal central sinus echo complex.  Abdominal aorta:  Suboptimal visualization of the mid abdominal aorta.  Mild fusiform dilation of the distal abdominal aorta on the prior CT.  Abdominal  aortic atherosclerosis.  IMPRESSION: 1.Gallbladder wall thickening, pericholecystic fluid and positive sonographic Murphy's sign with echogenic foci in the gallbladder consistent with acute cholecystitis. Mild dilation of the common bile duct for age without common duct stone identified.  If cholecystectomy performed, intraoperative cholangiogram is recommended. 2.  Echogenic renal cortex compatible with medical renal disease. 3.  Abdominal aortic atherosclerosis.  Original Report Authenticated By: Andreas Newport, M.D.   Ct Abdomen Pelvis W Contrast  05/20/2011  *RADIOLOGY REPORT*  Clinical Data: Left lower quadrant pain  CT  ABDOMEN AND PELVIS WITH CONTRAST  Technique:  Multidetector CT imaging of the abdomen and pelvis was performed following the standard protocol during bolus administration of intravenous contrast.  Contrast: 80mL OMNIPAQUE IOHEXOL 300 MG/ML IJ SOLN 80 ml Omnipaque- 300 IV  Comparison: [facility] CT abdomen pelvis dated 04/19/2011  Findings: Dependent atelectasis in the bilateral lower lobes.  Liver is notable for periportal edema.  Suspected cystic lesion in the right hepatic dome is not well visualized on the current study (series 2/image 13).  Spleen, pancreas, and adrenal glands are within normal limits.  Gallbladder is notable for gallbladder wall edema and gallbladder sludge (series 2/image 37).  Kidneys are within normal limits.  No hydronephrosis.  No evidence of bowel obstruction.  Status post right hemicolectomy. Colonic wall thickening, most prominent in the distal sigmoid colon (series 2/image 64), suspicious for infectious/inflammatory colitis such as pseudomembranous colitis.  Atherosclerotic calcifications of the abdominal aorta and branch vessels.  Diffuse mesenteric edema.  No free air.  Small retroperitoneal lymph nodes which do not meet pathologic CT size criteria.  Prostate is unremarkable.  Bladder is within normal limits.  Degenerative changes of the visualized thoracolumbar spine.  Stable superior endplate changes at L4.  Stable fusion at L5-S1.  IMPRESSION: Status post right hemicolectomy.  Colonic wall thickening, most prominent in the distal sigmoid colon, suspicious for infectious/inflammatory colitis such as pseudomembranous colitis.  Diffuse mesenteric and periportal, likely secondary to hypoalbuminemia.  Gallbladder sludge.  Gallbladder wall edema, favored to be secondary to hypoalbuminemia, although acute cholecystitis cannot be entirely excluded.  Original Report Authenticated By: Charline Bills, M.D.     Anti-infectives:   Anti-infectives     Start     Dose/Rate Route  Frequency Ordered Stop   05/20/11 2200   metroNIDAZOLE (FLAGYL) tablet 500 mg        500 mg Oral 3 times per day 05/20/11 1915     05/20/11 2000   ciprofloxacin (CIPRO) IVPB 400 mg  Status:  Discontinued        400 mg 200 mL/hr over 60 Minutes Intravenous Every 12 hours 05/20/11 1915 05/21/11 0944   05/20/11 1800   levofloxacin (LEVAQUIN) IVPB 750 mg  Status:  Discontinued        750 mg 100 mL/hr over 90 Minutes Intravenous To [facility] 05/20/11 1716 05/20/11 1915   05/20/11 1730   metroNIDAZOLE (FLAGYL) IVPB 500 mg        500 mg 100 mL/hr over 60 Minutes Intravenous  Once 05/20/11 1716 05/20/11 1826          Ovidio Kin, MD, FACS Pager: [phone],   [facility] Office: [phone] 05/22/2011

## 2011-05-23 LAB — BASIC METABOLIC PANEL
BUN: 9 mg/dL (ref 6–23)
CO2: 22 mEq/L (ref 19–32)
Calcium: 7.9 mg/dL — ABNORMAL LOW (ref 8.4–10.5)
Creatinine, Ser: 1.18 mg/dL (ref 0.50–1.35)
Glucose, Bld: 88 mg/dL (ref 70–99)

## 2011-05-23 LAB — CBC
MCH: 28.7 pg (ref 26.0–34.0)
MCV: 89.3 fL (ref 78.0–100.0)
Platelets: 252 10*3/uL (ref 150–400)
RDW: 15.3 % (ref 11.5–15.5)

## 2011-05-23 LAB — PROTIME-INR: INR: 3.62 — ABNORMAL HIGH (ref 0.00–1.49)

## 2011-05-23 MED ORDER — FUROSEMIDE 10 MG/ML IJ SOLN
20.0000 mg | Freq: Once | INTRAMUSCULAR | Status: AC
  Administered 2011-05-23: 20 mg via INTRAVENOUS
  Filled 2011-05-23: qty 2

## 2011-05-23 NOTE — Progress Notes (Signed)
PCP: Josue Hector, MD, MD  Brief HPI:  This is a 66 year old, African American male, who presented to the hospital with 2-3 day history of left lower abdominal pain, radiating to the back. Patient has a past medical history of hypertension, and a previous history of stroke. He tells that the abdominal pain was associated with diarrhea, which was small in quantity, but watery. He had multiple episodes over the course of 2-3 days, but hasn't had any episodes on the day of admission. He also mentioned, that he's had a lot of pain while having a bowel movement. Had some nausea, but denied any emesis. Denied any blood in the stool. Denied any fever. Patient was somewhat of a poor historian. Initially, he told me the pain was in the left side but then he also mentioned that he had some pain in the right side as well. The pain is 9/10 in intensity, constant pain, described as sharp in character. He could not think of any precipitating factors and there are no aggravating or relieving factors.  Past medical history:  Past Medical History   Diagnosis  Date   .  Hypertension    .  CVA (cerebral infarction)    .  Colitis  12/16/2010     Vasculitis   .  Renal disorder    .  Sepsis    .  Vascular insufficiency      Consultants: Gen Surgery has signed off  Procedures: None  Subjective: Patient feels much better. Denies any diarrhea. Minimal abdominal pain. Tolerating orally.  Objective: Vital signs in last 24 hours: Temp:  [97.6 F (36.4 C)-98.6 F (37 C)] 97.6 F (36.4 C) (04/01 0555) Pulse Rate:  [75-87] 82  (04/01 0555) Resp:  [18-20] 20  (04/01 0555) BP: (110-143)/(76-91) 126/91 mmHg (04/01 0555) SpO2:  [95 %-100 %] 96 % (04/01 0555) Weight change:  Last BM Date: 05/22/11  Intake/Output from previous day: 03/31 0701 - 04/01 0700 In: 2490 [P.O.:1530; I.V.:960] Out: 800 [Urine:800] Intake/Output this shift:    General appearance: alert, cooperative, appears stated age and  no distress Resp: clear to auscultation bilaterally Cardio: regular rate and rhythm, S1, S2 normal, no murmur, click, rub or gallop GI: Soft, non tender today. BS sluggish. No masses or organomegaly. Neurologic: Grossly normal  Lab Results:  Basename 05/23/11 0454 05/22/11 0653  WBC 5.0 5.1  HGB 7.5* 8.1*  HCT 23.3* 24.9*  PLT 252 246   BMET  Basename 05/23/11 0454 05/22/11 0653 05/21/11 0700 05/20/11 1314  NA 138 137 -- --  K 4.0 3.8 -- --  CL 110 108 -- --  CO2 22 23 -- --  GLUCOSE 88 88 -- --  BUN 9 9 -- --  CREATININE 1.18 1.18 -- --  CALCIUM 7.9* 8.4 -- --  ALT -- -- 14 15    Studies/Results: No results found.  Medications:  Scheduled:    . amLODipine  5 mg Oral Daily  . feeding supplement  237 mL Oral TID BM  . fentaNYL  25 mcg Transdermal Q72H  . folic acid  1 mg Oral Daily  . furosemide  20 mg Intravenous Once  . metoprolol tartrate  12.5 mg Oral BID  . metroNIDAZOLE  500 mg Oral Q8H  . mulitivitamin with minerals  1 tablet Oral Daily  . saccharomyces boulardii  250 mg Oral BID  . sodium chloride  3 mL Intravenous Q12H  . Warfarin - Pharmacist Dosing Inpatient   Does not apply [phone]  Assessment/Plan:  Principal Problem:  *Colitis Active Problems:  HTN (hypertension)  Diarrhea  H/O: CVA (cardiovascular accident)  Acute abdominal pain  Anemia  Long term current use of anticoagulant  Abnormal gallbladder ultrasound  Sacral decubitus ulcer, stage II  Malnutrition, calorie    #1 C-diff colitis: He was recently treated for C. Difficile Colitis back in February. He was treated for 2 weeks with Flagyl. So this episode will be considered a first relapse. Continue with Flagyl (started 3/29). Continue Florastor. Transition to oral pain meds. Advance diet.  #2 Abnormal gallbladder on [place] and CT: Apparently this is a chronic finding for him per surgery. They do not believe he has cholecystitis.   #3 long-term anticoagulation: The reason for his  warfarin is not entirely clear. I spoke to the nursing director at [facility] and she mentioned that he was on coumadin for hypercoaguable state. This was apparently diagnosed at [facility]. Further details were not available. Continue with warfarin for now. No overt bleeding.  #4 anemia: Drop in hemoglobin today. This is probably due to hemodilution. Due to weakness will go ahead and transfuse. He does have chronic anemia. He had an anemia panel recently in February, so, one would not be repeated. No point in checking FOBT as it will be positive due to colitis.  #5 stage II sacral decubitus ulcer: Wound care.   #6 protein calorie malnutrition: Tolerating diet. On Ensure as well.   #7 history of stroke is stable.   #8 history of hypertension. BP is stable. Monitor him closely.   DVT, prophylaxis. He is on therapeutic anticoagulation.   He is a full code   Disposition: Back to SNF 4/2.    LOS: 3 days   [facility] Pager [phone] 05/23/2011, 8:26 AM

## 2011-05-23 NOTE — Consult Note (Signed)
WOC consult Note Reason for Consult: req to eval pressure ulcer of the sacrum.  Pt reports he had an open sore there but it is healed now. Only using foam dressing at SNF for protection.  Assessment of this area, pt does have healed Stage II pressure ulcer of the right upper gluteal fold.  No need for further dressings at this time.  Wound type:healed Stage II pressure ulcer Education: provided pt with education on prevention of further breakdown and to turn and reposition off this area at least every 2 hours.  Educated him that this area will be prone to breakdown as the skin is not as strong as before.  He reports he has cushion in his WC at SNF and is turning in the bed while here.  Encouraged high protein diet as well for continued healing.   Re consult if needed, will not follow at this time. Thanks  Jami Bogdanski [employer], CWOCN [phone])

## 2011-05-23 NOTE — Progress Notes (Addendum)
OT Cancellation Note  Orders received for OT consult, note pt currently getting first unit of RBCs at this time.  Note pt from SNF and planning to return back to SNF, hopefully 05/24/11.  Will defer OT eval to SNF.  Nyaja Dubuque OTR/L 05/23/2011, 10:41 AM Pager number [phone]

## 2011-05-23 NOTE — Progress Notes (Signed)
ANTICOAGULATION CONSULT NOTE - Follow Up Consult  Pharmacy Consult for Coumadin Indication: hx hypercoagulable state  No Known Allergies  Patient Measurements: Height: 5\' 7"  (170.2 cm) Weight: 124 lb 9 oz (56.5 kg) IBW/kg (Calculated) : 66.1  Heparin Dosing Weight:   Vital Signs: Temp: 97.6 F (36.4 C) (04/01 0555) Temp src: Oral (04/01 0555) BP: 126/91 mmHg (04/01 0555) Pulse Rate: 82  (04/01 0555)  Labs:  Basename 05/23/11 0454 05/22/11 0653 05/21/11 0700  HGB 7.5* 8.1* --  HCT 23.3* 24.9* 26.0*  PLT 252 246 262  APTT -- -- --  LABPROT 36.6* 35.4* 24.6*  INR 3.62* 3.47* 2.18*  HEPARINUNFRC -- -- --  CREATININE 1.18 1.18 1.19  CKTOTAL -- -- --  CKMB -- -- --  TROPONINI -- -- --   Estimated Creatinine Clearance: 50.5 ml/min (by C-G formula based on Cr of 1.18).  Assessment: Patient is a 66 y.o M on coumadin for hypercoagulable state. INR continues to increase despite dose held yesterday. Increase in INR may be due to drug-drug intxn with flagyl.   Goal of Therapy:  INR 2-3   Plan:  1) Continue to hold coumadin today  Emerick Weatherly P 05/23/2011,9:44 AM

## 2011-05-23 NOTE — Progress Notes (Signed)
Clinical Social Work Department BRIEF PSYCHOSOCIAL ASSESSMENT 05/23/2011  Patient:  Brian [name], Brian [name]     Account Number:  [id number]     Admit date:  05/20/2011  Clinical Social Worker:  Dennison Bulla  Date/Time:  05/23/2011 02:30 PM  Referred by:  Physician  Date Referred:  05/23/2011 Referred for  SNF Placement   Other Referral:   Interview type:  Patient Other interview type:    PSYCHOSOCIAL DATA Living Status:  FACILITY Admitted from facility:  [facility], [place] Level of care:  Skilled Nursing Facility Primary support name:  Yolanda Primary support relationship to patient:  CHILD, ADULT Degree of support available:   Adequate    CURRENT CONCERNS Current Concerns  Post-Acute Placement   Other Concerns:    SOCIAL WORK ASSESSMENT / PLAN CSW received referral due to patient being admitted from SNF. CSW met with patient at bedside. CSW explained CSW role and dc process. Patient reported that he has been at [facility] for a couple of months. CSW confirmed that patient wanted to return to SNF at dc. Patient reported that family was aware that he was in the hospital and would return at dc. CSW called SNF who stated patient could return at dc. CSW completed FL2. CSW will continue to follow.   Assessment/plan status:  Psychosocial Support/Ongoing Assessment of Needs Other assessment/ plan:   Information/referral to community resources:   Will return to SNF    PATIENT'S/FAMILY'S RESPONSE TO PLAN OF CARE: Patient was alert and oriented. Patient reported he has been at [facility] for a couple of months and wishes to return to SNF at dc. Patient stated that family is aware.

## 2011-05-23 NOTE — Progress Notes (Signed)
Utilization Review Completed.  Brian [name] T  05/23/2011  

## 2011-05-23 NOTE — Progress Notes (Signed)
Pt refused to have his vital signs taken with in the required after the blood administration had stopped. He ripped his blood pressure cuff off and stated he wanted to eat first although his tray had just gotten there and he was informed that we wanted to get his BP before he started to eat and he still refused. So they were taken post transfusion as documented

## 2011-05-23 NOTE — Evaluation (Signed)
Physical Therapy Evaluation and Discharge. Patient Details Name: Brian [name] MRN: [id number] DOB: [birth year] Today's Date: 05/23/2011  Problem List:  Patient Active Problem List  Diagnoses  . Nausea and vomiting  . Abdominal pain, acute, epigastric  . Leukocytosis  . Colitis  . HTN (hypertension)  . GERD (gastroesophageal reflux disease)  . Vasculitis  . Renal failure, acute  . Diarrhea  . H/O: CVA (cardiovascular accident)  . Acute abdominal pain  . Anemia  . Long term current use of anticoagulant  . Abnormal gallbladder ultrasound  . Sacral decubitus ulcer, stage II  . Malnutrition, calorie    Past Medical History:  Past Medical History  Diagnosis Date  . Hypertension   . CVA (cerebral infarction)   . 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 Renal disorder   . Sepsis   . Vascular insufficiency    Past Surgical History:  Past Surgical History  Procedure Date  . Back surgery 2002/2009  . Laparotomy 12/18/2010    Procedure: EXPLORATORY LAPAROTOMY;  Surgeon: Fabio Bering;  Location: [facility];  Service: General;  Laterality: N/A;  . Partial colectomy 12/18/2010    Procedure: PARTIAL COLECTOMY;  Surgeon: Fabio Bering;  Location: [facility];  Service: General;  Laterality: N/A;    PT Assessment/Plan/Recommendation PT Assessment Clinical Impression Statement: Pt is a 66 y/o male admitted for collits.  Pt was living in SNF prior to admission and planning to return to the facility upon discharge tomorrow.  All further PT needs to be addressed in SNF.  Acute PT signing off.  PT Recommendation/Assessment: All further PT needs can be met in the next venue of care PT Problem List: Decreased activity tolerance;Decreased mobility;Pain PT Therapy Diagnosis : Generalized weakness;Difficulty walking PT Recommendation Follow Up Recommendations: Skilled nursing facility Equipment Recommended: None recommended by PT PT Goals     PT Evaluation Precautions/Restrictions    Precautions Precautions: Fall Restrictions Weight Bearing Restrictions: No Prior Functioning  Home Living Lives With: Other (Comment) Receives Help From: Other (Comment) Type of Home: Skilled Nursing Facility Home Layout: One level Prior Function Level of Independence: Needs assistance with ADLs;Needs assistance with gait;Needs assistance with tranfers Able to Take Stairs?: No Driving: No Vocation: On disability Leisure: Hobbies-no Cognition Cognition Arousal/Alertness: Awake/alert Overall Cognitive Status: Appears within functional limits for tasks assessed Sensation/Coordination Sensation Light Touch: Appears Intact Stereognosis: Not tested Hot/Cold: Not tested Proprioception: Not tested Coordination Gross Motor Movements are Fluid and Coordinated: Yes Fine Motor Movements are Fluid and Coordinated: Not tested Extremity Assessment RUE Assessment RUE Assessment: Not tested LUE Assessment LUE Assessment: Not tested RLE Assessment RLE Assessment: Within Functional Limits LLE Assessment LLE Assessment: Within Functional Limits Mobility (including Balance) Bed Mobility Bed Mobility: Yes Supine to Sit: 7: Independent;HOB flat Sit to Supine: 7: Independent;HOB flat Transfers Transfers: Yes Sit to Stand: 6: Modified independent (Device/Increase time);From bed;With upper extremity assist Stand to Sit: 6: Modified independent (Device/Increase time);To bed;To chair/3-in-1;With upper extremity assist Stand Pivot Transfers: 5: Supervision Stand Pivot Transfer Details (indicate cue type and reason): supervision for safety but no assistance required.  Ambulation/Gait Ambulation/Gait: No (Pt declined due to fatigue and pain. ) Stairs: No Wheelchair Mobility Wheelchair Mobility: No  Balance Balance Assessed: Yes Static Sitting Balance Static Sitting - Balance Support: Feet supported;No upper extremity supported Static Sitting - Level of Assistance: 7: Independent Static  Sitting - Comment/# of Minutes: 3+ minutes on EOB with no LOB Static Standing Balance Static Standing - Balance Support: No upper extremity supported  Static Standing - Level of Assistance: 5: Stand by assistance Static Standing - Comment/# of Minutes: <29min at side of bed with mild posterior lean.   Exercise    End of Session PT - End of Session Equipment Utilized During Treatment: Gait belt Activity Tolerance: Patient limited by fatigue Patient left: in bed;with call bell in reach;with bed alarm set Nurse Communication: Mobility status for transfers;Mobility status for ambulation General Behavior During Session: [facility] for tasks performed Cognition: [facility] for tasks performed  Miakoda Mcmillion 05/23/2011, 5:02 PM Artist Bloom L. Beyza Bellino DPT [phone]

## 2011-05-24 DIAGNOSIS — A0472 Enterocolitis due to Clostridium difficile, not specified as recurrent: Secondary | ICD-10-CM | POA: Diagnosis present

## 2011-05-24 DIAGNOSIS — D6859 Other primary thrombophilia: Secondary | ICD-10-CM | POA: Diagnosis present

## 2011-05-24 LAB — TYPE AND SCREEN
ABO/RH(D): O POS
Antibody Screen: NEGATIVE
Unit division: 0

## 2011-05-24 LAB — CBC
HCT: 30.7 % — ABNORMAL LOW (ref 39.0–52.0)
MCHC: 33.2 g/dL (ref 30.0–36.0)
MCV: 87.5 fL (ref 78.0–100.0)
RDW: 15.3 % (ref 11.5–15.5)

## 2011-05-24 MED ORDER — WARFARIN SODIUM 2.5 MG PO TABS: 5.0000 mg | ORAL_TABLET | Freq: Every evening | ORAL | Status: DC

## 2011-05-24 MED ORDER — ENSURE COMPLETE PO LIQD: 237.0000 mL | Freq: Three times a day (TID) | ORAL | Status: DC

## 2011-05-24 MED ORDER — HYDROCODONE-ACETAMINOPHEN 5-325 MG PO TABS: 1.0000 | ORAL_TABLET | ORAL | Status: AC | PRN

## 2011-05-24 MED ORDER — FENTANYL 25 MCG/HR TD PT72: 1.0000 | MEDICATED_PATCH | TRANSDERMAL | Status: DC

## 2011-05-24 MED ORDER — WARFARIN SODIUM 5 MG PO TABS
5.0000 mg | ORAL_TABLET | Freq: Once | ORAL | Status: DC
  Filled 2011-05-24: qty 1

## 2011-05-24 MED ORDER — METRONIDAZOLE 500 MG PO TABS: 500.0000 mg | ORAL_TABLET | Freq: Three times a day (TID) | ORAL | Status: AC

## 2011-05-24 MED ORDER — ALBUTEROL SULFATE (5 MG/ML) 0.5% IN NEBU: 2.5000 mg | INHALATION_SOLUTION | RESPIRATORY_TRACT | Status: DC | PRN

## 2011-05-24 NOTE — Progress Notes (Signed)
Pt discharged to SNF after d/c summary reviewed. Pt remains stable. No signs and symptoms of distress. Educated to return to ER in the event of SOB, dizziness, chest pain, or fainting. Tashonda Pinkus, RN  

## 2011-05-24 NOTE — Progress Notes (Signed)
CSW faxed dc summary to SNF. SNF stated patient could return at 1300. CSW prepared dc packet. CSW informed patient and RN of dc and all were agreeable. Patient stated he would inform family. CSW coordinated transportation via [place]. CSW is signing off. [place], [place] [phone]

## 2011-05-24 NOTE — Discharge Summary (Signed)
Physician Discharge Summary  Patient ID: BARD HAUPERT MRN: [id number] DOB/AGE: September 20, 1945 66 y.o.  Admit date: 05/20/2011 Discharge date: 05/24/2011  PCP: Josue Hector, MD, MD Being followed by Dr. Bufford Spikes at the SNF  Discharge Diagnoses:  Principal Problem:  *C. difficile colitis Active Problems:  HTN (hypertension)  H/O: CVA (cardiovascular accident)  Anemia  Long term current use of anticoagulant  Abnormal gallbladder ultrasound  Sacral decubitus ulcer, stage II  Malnutrition, calorie  Hypercoagulable state   Discharged Condition: fair  Initial History: This is a 66 year old, African American male, who presented to the hospital with 2-3 day history of left lower abdominal pain, radiating to the back. Patient has a past medical history of hypertension, and a previous history of stroke. He tells that the abdominal pain was associated with diarrhea, which was small in quantity, but watery. He had multiple episodes over the course of 2-3 days, but hasn't had any episodes on the day of admission. He also mentioned, that he's had a lot of pain while having a bowel movement. Had some nausea, but denied any emesis. Denied any blood in the stool. Denied any fever. Patient was somewhat of a poor historian. Initially, he told me the pain was in the left side but then he also mentioned that he had some pain in the right side as well. The pain is 9/10 in intensity, constant pain, described as sharp in character. He could not think of any precipitating factors and there are no aggravating or relieving factors.  Hospital Course:   #1 C-diff colitis: He was recently treated for C. Difficile Colitis back in February. He was treated for 2 weeks with Flagyl. So this episode will be considered a first relapse. Continue with Flagyl (started 3/29) for total of 14 days. Continue Florastor. Doing well with oral pain medications. Pain is much better. Tolerating diet.   #2 Abnormal  gallbladder on [place] and CT: Apparently this is a chronic finding for him per surgery. They do not believe he has cholecystitis. Patient currently asymptomatic.  #3 long-term anticoagulation: The reason for his warfarin is not entirely clear. I spoke to the nursing director at [facility] and she mentioned that, based on her notes, he was on coumadin for hypercoaguable state. This was apparently diagnosed at [facility]. Further details were not available. Continue with warfarin for now. No overt bleeding. Would encourage physician at [facility] to investigate this further.  #4 anemia: Drop in hemoglobin yesterday. This was probably due to hemodilution. Due to weakness he was transfused. He does have chronic anemia. He had an anemia panel recently in February, so, one was not be repeated. No point in checking FOBT as it will be positive due to colitis.   #5 stage II sacral decubitus ulcer: Continue Wound care.   #6 protein calorie malnutrition: Tolerating diet. On Ensure as well.   #7 history of stroke is stable.   #8 history of hypertension. BP is stable. Monitor him closely.   Overall patient has significantly improved. He does not have diarrhea. His pain is better. He is tolerating his diet. His vitals are stable. He is stable for discharge.   PERTINENT LABS  Hgb 10.2 today. Was 7.5 on 4/1. For the most part has been stable between 8 and 9.  IMAGING STUDIES US Abdomen Complete  05/20/2011  *RADIOLOGY REPORT*  Clinical Data:  Abdominal pain.  History of hemicolectomy.  COMPLETE ABDOMINAL ULTRASOUND  Comparison:  04/19/2011.  Findings:  Gallbladder:  Positive  sonographic Murphy's sign, wall thickening and pericholecystic fluid compatible with acute cholecystitis. Echogenic foci within thegallbladder are atypical for ordinary sludge or stones and probably represents tumefactive sludge, hemorrhage  or sloughing of the gallbladder wall. There is no sonographic evidence of emphysematous  cholecystitis.  Common bile duct:  Mildly dilated for age at 9 mm.  If cholecystectomy is performed, intraoperative cholangiogram is recommended.  Liver:  No focal lesion identified.  Within normal limits in parenchymal echogenicity.  IVC:  Appears normal.  Pancreas:  No focal abnormality seen.  Spleen:  68 mm.  Normal echotexture.  Right Kidney:  Echogenic cortex, likely representing medical renal disease.  11.5 cm long axis.  Normal central sinus echo complex.  Left Kidney:  Echogenic cortex, likely representing medical renal disease.  11.7 cm.  Normal central sinus echo complex.  Abdominal aorta:  Suboptimal visualization of the mid abdominal aorta.  Mild fusiform dilation of the distal abdominal aorta on the prior CT.  Abdominal aortic atherosclerosis.  IMPRESSION: 1.Gallbladder wall thickening, pericholecystic fluid and positive sonographic Murphy's sign with echogenic foci in the gallbladder consistent with acute cholecystitis. Mild dilation of the common bile duct for age without common duct stone identified.  If cholecystectomy performed, intraoperative cholangiogram is recommended. 2.  Echogenic renal cortex compatible with medical renal disease. 3.  Abdominal aortic atherosclerosis.  Original Report Authenticated By: Andreas Newport, M.D.   Ct Abdomen Pelvis W Contrast  05/20/2011  *RADIOLOGY REPORT*  Clinical Data: Left lower quadrant pain  CT ABDOMEN AND PELVIS WITH CONTRAST  Technique:  Multidetector CT imaging of the abdomen and pelvis was performed following the standard protocol during bolus administration of intravenous contrast.  Contrast: 80mL OMNIPAQUE IOHEXOL 300 MG/ML IJ SOLN 80 ml Omnipaque- 300 IV  Comparison: [facility] CT abdomen pelvis dated 04/19/2011  Findings: Dependent atelectasis in the bilateral lower lobes.  Liver is notable for periportal edema.  Suspected cystic lesion in the right hepatic dome is not well visualized on the current study (series 2/image 13).  Spleen, pancreas,  and adrenal glands are within normal limits.  Gallbladder is notable for gallbladder wall edema and gallbladder sludge (series 2/image 37).  Kidneys are within normal limits.  No hydronephrosis.  No evidence of bowel obstruction.  Status post right hemicolectomy. Colonic wall thickening, most prominent in the distal sigmoid colon (series 2/image 64), suspicious for infectious/inflammatory colitis such as pseudomembranous colitis.  Atherosclerotic calcifications of the abdominal aorta and branch vessels.  Diffuse mesenteric edema.  No free air.  Small retroperitoneal lymph nodes which do not meet pathologic CT size criteria.  Prostate is unremarkable.  Bladder is within normal limits.  Degenerative changes of the visualized thoracolumbar spine.  Stable superior endplate changes at L4.  Stable fusion at L5-S1.  IMPRESSION: Status post right hemicolectomy.  Colonic wall thickening, most prominent in the distal sigmoid colon, suspicious for infectious/inflammatory colitis such as pseudomembranous colitis.  Diffuse mesenteric and periportal, likely secondary to hypoalbuminemia.  Gallbladder sludge.  Gallbladder wall edema, favored to be secondary to hypoalbuminemia, although acute cholecystitis cannot be entirely excluded.  Original Report Authenticated By: Charline Bills, M.D.   US Renal  05/19/2011  *RADIOLOGY REPORT*  Clinical Data:  kidneys stones  RENAL/URINARY TRACT ULTRASOUND COMPLETE  Comparison:  Ultrasound 12/20/2010 and CT scan 04/19/2011  Findings:  Right Kidney:  Measures 11 cm in length.  No hydronephrosis or diagnostic renal calculus.  Again noted cortical increased echogenicity.  Left Kidney:  Measures 11.8 cm in length.  Again noted cortical  increased echogenicity.  No hydronephrosis or diagnostic renal calculus.  Bladder:  No bladder filling defects are identified.  There is abnormal appearance of visualized gallbladder. Heterogeneous irregular filling defect is noted within gallbladder. This may  represent sludge ball or gallbladder wall polyp.  There is no sonographic Murphy's sign.  IMPRESSION:  1.  No hydronephrosis or diagnostic renal calculus.  Again noted bilateral renal cortical increased echogenicity due to chronic medical renal disease. 2.  Abnormal appearance of the gallbladder with a heterogeneous irregular filling defect within gallbladder.  This may represent gallbladder polyp or sludge ball.  Clinical correlation is necessary.  Further evaluation with CT scan or MRCP could be performed as clinically warranted.  Original Report Authenticated By: Natasha Mead, M.D.    Discharge Exam: Blood pressure 132/84, pulse 84, temperature 98.4 F (36.9 C), temperature source Oral, resp. rate 20, height 5\' 7"  (1.702 m), weight 56.5 kg (124 lb 9 oz), SpO2 97.00%. General appearance: alert, cooperative, appears stated age and no distress Resp: clear to auscultation bilaterally Cardio: regular rate and rhythm, S1, S2 normal, no murmur, click, rub or gallop GI: soft, non-tender; bowel sounds normal; no masses,  no organomegaly Extremities: extremities normal, atraumatic, no cyanosis or edema Neurologic: Grossly normal  Disposition: 03-Skilled Nursing Facility  Discharge Orders    Future Orders Please Complete By Expires   Diet - low sodium heart healthy      Increase activity slowly      Discharge instructions      Comments:   Warfarin per pharmacy protocol. Keep INR between 2 and 3. May need higher dose once he is off of flagyl     Current Discharge Medication List    START taking these medications   Details  albuterol (PROVENTIL) (5 MG/ML) 0.5% nebulizer solution Take 0.5 mLs (2.5 mg total) by nebulization every 4 (four) hours as needed for wheezing. Qty: 20 mL    feeding supplement (ENSURE COMPLETE) LIQD Take 237 mLs by mouth 3 (three) times daily between meals.    HYDROcodone-acetaminophen (NORCO) 5-325 MG per tablet Take 1-2 tablets by mouth every 4 (four) hours as needed for  pain. Qty: 30 tablet, Refills: 0    metroNIDAZOLE (FLAGYL) 500 MG tablet Take 1 tablet (500 mg total) by mouth every 8 (eight) hours. For 11 days      CONTINUE these medications which have CHANGED   Details  fentaNYL (DURAGESIC - DOSED MCG/HR) 25 MCG/HR Place 1 patch (25 mcg total) onto the skin every 3 (three) days. Qty: 5 patch, Refills: 0    warfarin (COUMADIN) 2.5 MG tablet Take 2 tablets (5 mg total) by mouth every evening. May need higher dose after he finishes the course of flagyl      CONTINUE these medications which have NOT CHANGED   Details  acetaminophen (TYLENOL) 325 MG tablet Take 650 mg by mouth every 4 (four) hours as needed. For temp > 101.    amLODipine (NORVASC) 5 MG tablet Take 5 mg by mouth daily.    folic acid (FOLVITE) 1 MG tablet Take 1 mg by mouth daily.    metoprolol tartrate (LOPRESSOR) 25 MG tablet Take 12.5 mg by mouth 2 (two) times daily.    Multiple Vitamin (MULITIVITAMIN WITH MINERALS) TABS Take 1 tablet by mouth daily.    potassium chloride SA (K-DUR,KLOR-CON) 20 MEQ tablet Take 40 mEq by mouth daily.    promethazine (PHENERGAN) 25 MG tablet Take 25 mg by mouth every 6 (six) hours as needed. For nausea/vomiting.  saccharomyces boulardii (FLORASTOR) 250 MG capsule Take 250 mg by mouth 2 (two) times daily.    sevelamer (RENVELA) 800 MG tablet Take 2,400 mg by mouth 2 (two) times daily.      STOP taking these medications     bisacodyl (DULCOLAX) 10 MG suppository      oxyCODONE (OXY IR/ROXICODONE) 5 MG immediate release tablet        Follow-up Information    Follow up with Josue Hector, MD. Schedule an appointment as soon as possible for a visit in 2 months. (when you get out of rehab)       Follow up with REED,TIFFANY L., DO in 1 week. (ask about why you are on blood thinners)    Contact information:   [address] [place] [address] [phone]          Total Discharge Time: 35  mins  [facility]  [facility] Pager [phone]  05/24/2011, 9:24 AM

## 2011-05-24 NOTE — Progress Notes (Signed)
ANTICOAGULATION CONSULT NOTE - Follow Up Consult  Pharmacy Consult for Coumadin Indication: Hx hypercoagulable state   No Known Allergies  Patient Measurements: Height: 5\' 7"  (170.2 cm) Weight: 124 lb 9 oz (56.5 kg) IBW/kg (Calculated) : 66.1  Heparin Dosing Weight:   Vital Signs: Temp: 98.4 F (36.9 C) (04/02 0527) Temp src: Oral (04/02 0527) BP: 132/84 mmHg (04/02 0527) Pulse Rate: 84  (04/02 0527)  Labs:  Basename 05/24/11 0520 05/23/11 0454 05/22/11 0653  HGB 10.2* 7.5* --  HCT 30.7* 23.3* 24.9*  PLT 197 252 246  APTT -- -- --  LABPROT 28.6* 36.6* 35.4*  INR 2.64* 3.62* 3.47*  HEPARINUNFRC -- -- --  CREATININE -- 1.18 1.18  CKTOTAL -- -- --  CKMB -- -- --  TROPONINI -- -- --   Estimated Creatinine Clearance: 50.5 ml/min (by C-G formula based on Cr of 1.18).  Assessment: Patient is a 66y.o M on coumadin for hx hypercoagulable state.  INR now decreased to within therapeutic range after dose held for two days.  Hgb increased to 10.2 s/p blood transfusion on 4/01.  No bleeding noted.  Will be more conservative with coumadin dose since patient is currently on Flagyl for C.diff.  Goal of Therapy:  INR 2-3   Plan:  1) Coumadin 5mg  PO x1 today  Alfio Loescher P 05/24/2011,8:38 AM

## 2011-07-07 ENCOUNTER — Encounter (INDEPENDENT_AMBULATORY_CARE_PROVIDER_SITE_OTHER): Payer: Self-pay | Admitting: *Deleted

## 2011-08-23 ENCOUNTER — Ambulatory Visit (INDEPENDENT_AMBULATORY_CARE_PROVIDER_SITE_OTHER): Payer: Medicare Other | Admitting: Internal Medicine

## 2011-09-12 ENCOUNTER — Emergency Department (HOSPITAL_COMMUNITY)
Admission: EM | Admit: 2011-09-12 | Discharge: 2011-09-12 | Disposition: A | Discharge status: Home / Self Care | Payer: Medicare Other | Attending: Emergency Medicine | Admitting: Emergency Medicine

## 2011-09-12 ENCOUNTER — Other Ambulatory Visit: Payer: Self-pay

## 2011-09-12 DIAGNOSIS — I10 Essential (primary) hypertension: Secondary | ICD-10-CM | POA: Insufficient documentation

## 2011-09-12 DIAGNOSIS — E876 Hypokalemia: Secondary | ICD-10-CM | POA: Insufficient documentation

## 2011-09-12 DIAGNOSIS — Z7901 Long term (current) use of anticoagulants: Secondary | ICD-10-CM | POA: Insufficient documentation

## 2011-09-12 DIAGNOSIS — R51 Headache: Secondary | ICD-10-CM | POA: Insufficient documentation

## 2011-09-12 DIAGNOSIS — M542 Cervicalgia: Secondary | ICD-10-CM | POA: Insufficient documentation

## 2011-09-12 LAB — BASIC METABOLIC PANEL
BUN: 18 mg/dL (ref 6–23)
CO2: 23 mEq/L (ref 19–32)
Chloride: 103 mEq/L (ref 96–112)
GFR calc non Af Amer: 59 mL/min — ABNORMAL LOW (ref >90)
Glucose, Bld: 90 mg/dL (ref 70–99)
Potassium: 3.3 mEq/L — ABNORMAL LOW (ref 3.5–5.1)
Sodium: 133 mEq/L — ABNORMAL LOW (ref 135–145)

## 2011-09-12 LAB — PROTIME-INR: INR: 1.2 (ref 0.00–1.49)

## 2011-09-12 NOTE — ED Notes (Signed)
Please refer to downtime documentation forms.

## 2011-09-13 ENCOUNTER — Ambulatory Visit (INDEPENDENT_AMBULATORY_CARE_PROVIDER_SITE_OTHER): Payer: Medicare Other | Admitting: Internal Medicine

## 2011-09-15 ENCOUNTER — Ambulatory Visit (INDEPENDENT_AMBULATORY_CARE_PROVIDER_SITE_OTHER): Payer: Medicare Other | Admitting: Internal Medicine

## 2011-09-29 ENCOUNTER — Ambulatory Visit (INDEPENDENT_AMBULATORY_CARE_PROVIDER_SITE_OTHER): Payer: Medicare Other | Admitting: Internal Medicine

## 2011-09-29 ENCOUNTER — Encounter (INDEPENDENT_AMBULATORY_CARE_PROVIDER_SITE_OTHER): Payer: Self-pay | Admitting: Internal Medicine

## 2011-09-29 VITALS — BP 160/100 | HR 84 | Temp 97.9°F | Ht 67.5 in | Wt 140.1 lb

## 2011-09-29 DIAGNOSIS — R1013 Epigastric pain: Secondary | ICD-10-CM

## 2011-09-29 DIAGNOSIS — R109 Unspecified abdominal pain: Secondary | ICD-10-CM | POA: Insufficient documentation

## 2011-09-29 DIAGNOSIS — R1012 Left upper quadrant pain: Secondary | ICD-10-CM

## 2011-09-29 NOTE — Patient Instructions (Addendum)
Samples of Nexium given to patient. For 15 days. PR in 2 weeks. OV in 1 month. I will discuss this case with Dr. Karilyn Cota.

## 2011-09-29 NOTE — Progress Notes (Addendum)
Subjective:     Patient ID: Brian [name], male   DOB: November 10, 1945, 66 y.o.   MRN: [id number]  HPI Brian [name] is a 66 yr old male presenting today with c/o epigastric and left upper quadrant pain x 3 months.  The pain comes and goes.  He says he actually thought his problem was from his gallbladder. He was [facility]  in April to undergo a cholecystectomy but the surgery was cancelled.    Surgery did not believe he had cholecystitis.  Hx of CVA x 2.  First CVA at age 63.  He  has a hx of hypercoagable state and is chronic Coumadin therapy.  He wa diagnosed with enteritis back in October of 2012 to [facility].  He was found to have mesenteric ischemia and underwent a right hemicolectomy. Pathology suggested vasculitis and so, the patient was transferred to [facility]. Appetite is good. He has been gaining weight.  Some nausea in the am.  No melena or bright red rectal bleeding.  Treated for C.difficile in April of this year with Flagyl. Stools now are formed. No diarrhea. BM x 1 a day   04/2011 CT abdomen/pelvis with CM: IMPRESSION:  Status post right hemicolectomy. Colonic wall thickening, most  prominent in the distal sigmoid colon, suspicious for  infectious/inflammatory colitis such as pseudomembranous colitis.  Diffuse mesenteric and periportal, likely secondary to  hypoalbuminemia.  Gallbladder sludge. Gallbladder wall edema, favored to be  secondary to hypoalbuminemia, although acute cholecystitis cannot   be entirely excluded.  Original Report Authenticated By: Charline Bills, M.D.  CBC    Component Value Date/Time   WBC 6.4 05/24/2011 0520   RBC 3.51* 05/24/2011 0520   HGB 10.2* 05/24/2011 0520   HCT 30.7* 05/24/2011 0520   PLT 197 05/24/2011 0520   MCV 87.5 05/24/2011 0520   MCH 29.1 05/24/2011 0520   MCHC 33.2 05/24/2011 0520   RDW 15.3 05/24/2011 0520   LYMPHSABS 2.2 05/20/2011 1314   MONOABS 1.2* 05/20/2011 1314   EOSABS 0.2 05/20/2011 1314   BASOSABS 0.0 05/20/2011 1314    06/13/2011  H and H 10.7 and 34.1, MCV 91.9, Amylase 108, Lipase 41, Vitamin B 12 504, Folate greater than 20, TIBC 162 UIBC 110, % Sat 32., Iron 52 NA 137, K 4.6, Chloride 106, Glucose 78, BUN 20, Creatinine 1.09, Total bili 0.4, ALP 64, AST 18, ALT 17, Total protein 7.5, Albumin 3.3, Calcium 8.6. Iron/TIBC/Ferritin    Component Value Date/Time   IRON 22* 04/20/2011 0140   TIBC 77* 04/20/2011 0140   FERRITIN 1910* 04/20/2011 0140   CMP     Component Value Date/Time   NA 133* 09/12/2011 0327   K 3.3* 09/12/2011 0327   CL 103 09/12/2011 0327   CO2 23 09/12/2011 0327   GLUCOSE 90 09/12/2011 0327   BUN 18 09/12/2011 0327   CREATININE 1.25 09/12/2011 0327   CALCIUM 9.2 09/12/2011 0327   PROT 6.8 05/21/2011 0700   ALBUMIN 2.0* 05/21/2011 0700   AST 18 05/21/2011 0700   ALT 14 05/21/2011 0700   ALKPHOS 68 05/21/2011 0700   BILITOT 0.2* 05/21/2011 0700   GFRNONAA 59* 09/12/2011 0327   GFRAA 69* 09/12/2011 0327        Review of Systems see hpi  Current Outpatient Prescriptions  Medication Sig Dispense Refill  . acetaminophen (TYLENOL) 325 MG tablet Take 650 mg by mouth every 4 (four) hours as needed. For temp > 101.      . albuterol (PROVENTIL) (5  MG/ML) 0.5% nebulizer solution Take 0.5 mLs (2.5 mg total) by nebulization every 4 (four) hours as needed for wheezing.  20 mL    . amLODipine (NORVASC) 5 MG tablet Take 5 mg by mouth daily.      . meclizine (ANTIVERT) 25 MG tablet Take 25 mg by mouth 3 (three) times daily as needed.      . metoprolol tartrate (LOPRESSOR) 25 MG tablet Take 12.5 mg by mouth 2 (two) times daily.      . potassium chloride SA (K-DUR,KLOR-CON) 20 MEQ tablet Take 40 mEq by mouth daily.      . promethazine (PHENERGAN) 25 MG tablet Take 25 mg by mouth every 6 (six) hours as needed. For nausea/vomiting.      . sevelamer (RENVELA) 800 MG tablet Take 2,400 mg by mouth 2 (two) times daily.      . sevelamer (RENVELA) 800 MG tablet Take 800 mg by mouth 3 (three) times daily with meals.      .  warfarin (COUMADIN) 2.5 MG tablet Take 2 tablets (5 mg total) by mouth every evening. May need higher dose after he finishes the course of flagyl      . feeding supplement (ENSURE COMPLETE) LIQD Take 237 mLs by mouth 3 (three) times daily between meals.      . fentaNYL (DURAGESIC - DOSED MCG/HR) 25 MCG/HR Place 1 patch (25 mcg total) onto the skin every 3 (three) days.  5 patch  0  . folic acid (FOLVITE) 1 MG tablet Take 1 mg by mouth daily.      . Multiple Vitamin (MULITIVITAMIN WITH MINERALS) TABS Take 1 tablet by mouth daily.      Marland Kitchen saccharomyces boulardii (FLORASTOR) 250 MG capsule Take 250 mg by mouth 2 (two) times daily.       Past Medical History  Diagnosis Date  . Hypertension   . CVA (cerebral infarction)   . 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 Renal disorder   . Sepsis   . Vascular insufficiency    Past Surgical History  Procedure Date  . Back surgery 2002/2009  . Laparotomy 12/18/2010    Procedure: EXPLORATORY LAPAROTOMY;  Surgeon: Fabio Bering;  Location: [facility];  Service: General;  Laterality: N/A;  . Partial colectomy 12/18/2010    Procedure: PARTIAL COLECTOMY;  Surgeon: Fabio Bering;  Location: [facility];  Service: General;  Laterality: N/A;   Family Status  Relation Status Death Age  . Mother Deceased     natural age 42  . Father Deceased     MI   History   Social History  . Marital Status: Widowed    Spouse Name: N/A    Number of Children: 6  . Years of Education: N/A   Occupational History  . retired     Investment banker, operational   Social History Main Topics  . Smoking status: Former Smoker -- 0.2 packs/day for 40 years    Types: Cigarettes  . Smokeless tobacco: Never Used   Comment: 2-3 cigarettes per day  . Alcohol Use: No  . Drug Use: Yes    Special: Marijuana     tried it a couple of months ago per pt.  . Sexually Active: No   Other Topics Concern  . Not on file   Social History Narrative   Has 20 brothers/sisters. Six living children. One deceased  child.   No Known Allergies      Objective:   Physical Exam  Filed Vitals:  09/29/11 1121  Height: 5' 7.5" (1.715 m)  Weight: 140 lb 1.6 oz (63.549 kg)     Alert and oriented. Skin warm and dry. Oral mucosa is moist.   . Sclera anicteric, conjunctivae is pink. Thyroid not enlarged. No cervical lymphadenopathy. Lungs clear. Heart regular rate and rhythm.  Abdomen is soft. Bowel sounds are positive. No hepatomegaly. No abdominal masses felt. Epigastric tenderness. Stool brown and guaiac negative.  No edema to lower extremities. Patient is alert and oriented.     Assessment:   Epigastric tenderness.  Possible PUD.  Made need repeat US gallbladder.   I will discuss this case with Dr. Karilyn Cota.     Plan:    Samples of Nexium given to patient. OV in 1 month. PR in 2 weeks.

## 2011-10-04 ENCOUNTER — Encounter (INDEPENDENT_AMBULATORY_CARE_PROVIDER_SITE_OTHER): Payer: Self-pay

## 2011-10-27 ENCOUNTER — Ambulatory Visit (INDEPENDENT_AMBULATORY_CARE_PROVIDER_SITE_OTHER): Payer: Medicare Other | Admitting: Internal Medicine

## 2011-11-18 LAB — TYPE AND SCREEN: ABO/RH(D): O POS

## 2012-10-30 ENCOUNTER — Encounter (HOSPITAL_COMMUNITY): Payer: Self-pay
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
## 2012-10-31 ENCOUNTER — Emergency Department (HOSPITAL_COMMUNITY)
Admission: EM | Admit: 2012-10-31 | Discharge: 2012-10-31 | Disposition: A | Discharge status: Home / Self Care | Payer: Medicare Other | Attending: Emergency Medicine | Admitting: Emergency Medicine

## 2012-10-31 ENCOUNTER — Emergency Department (HOSPITAL_COMMUNITY): Payer: Medicare Other

## 2012-10-31 DIAGNOSIS — Z79899 Other long term (current) drug therapy: Secondary | ICD-10-CM | POA: Insufficient documentation

## 2012-10-31 DIAGNOSIS — Z7902 Long term (current) use of antithrombotics/antiplatelets: Secondary | ICD-10-CM | POA: Insufficient documentation

## 2012-10-31 DIAGNOSIS — S0003XA Contusion of scalp, initial encounter: Secondary | ICD-10-CM | POA: Insufficient documentation

## 2012-10-31 DIAGNOSIS — Z87448 Personal history of other diseases of urinary system: Secondary | ICD-10-CM | POA: Insufficient documentation

## 2012-10-31 DIAGNOSIS — Z8673 Personal history of transient ischemic attack (TIA), and cerebral infarction without residual deficits: Secondary | ICD-10-CM | POA: Insufficient documentation

## 2012-10-31 DIAGNOSIS — S0093XA Contusion of unspecified part of head, initial encounter: Secondary | ICD-10-CM

## 2012-10-31 DIAGNOSIS — F17200 Nicotine dependence, unspecified, uncomplicated: Secondary | ICD-10-CM | POA: Insufficient documentation

## 2012-10-31 DIAGNOSIS — I10 Essential (primary) hypertension: Secondary | ICD-10-CM | POA: Insufficient documentation

## 2012-10-31 MED ORDER — IBUPROFEN 800 MG PO TABS
800.0000 mg | ORAL_TABLET | Freq: Once | ORAL | Status: AC
  Administered 2012-10-31: 800 mg via ORAL
  Filled 2012-10-31: qty 1

## 2012-10-31 NOTE — ED Notes (Signed)
Pt removed from back board. C-collar remains in place.
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
## 2012-10-31 NOTE — ED Notes (Signed)
MD at bedside. 
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
## 2013-12-26 ENCOUNTER — Other Ambulatory Visit (HOSPITAL_COMMUNITY): Payer: Self-pay | Admitting: Respiratory Therapy

## 2013-12-26 DIAGNOSIS — G4730 Sleep apnea, unspecified: Secondary | ICD-10-CM

## 2014-02-19 ENCOUNTER — Emergency Department (HOSPITAL_COMMUNITY)
Admission: EM | Admit: 2014-02-19 | Discharge: 2014-02-19 | Disposition: A | Discharge status: Home / Self Care | Payer: Medicare Other | Source: Home / Self Care | Attending: Emergency Medicine | Admitting: Emergency Medicine

## 2014-02-19 ENCOUNTER — Encounter (HOSPITAL_COMMUNITY): Payer: Self-pay | Admitting: Emergency Medicine

## 2014-02-19 ENCOUNTER — Emergency Department (HOSPITAL_COMMUNITY): Payer: Medicare Other

## 2014-02-19 DIAGNOSIS — Z87448 Personal history of other diseases of urinary system: Secondary | ICD-10-CM | POA: Insufficient documentation

## 2014-02-19 DIAGNOSIS — Z8719 Personal history of other diseases of the digestive system: Secondary | ICD-10-CM

## 2014-02-19 DIAGNOSIS — I606 Nontraumatic subarachnoid hemorrhage from other intracranial arteries: Secondary | ICD-10-CM | POA: Diagnosis not present

## 2014-02-19 DIAGNOSIS — Z79899 Other long term (current) drug therapy: Secondary | ICD-10-CM

## 2014-02-19 DIAGNOSIS — Z720 Tobacco use: Secondary | ICD-10-CM | POA: Insufficient documentation

## 2014-02-19 DIAGNOSIS — Z8673 Personal history of transient ischemic attack (TIA), and cerebral infarction without residual deficits: Secondary | ICD-10-CM

## 2014-02-19 DIAGNOSIS — R197 Diarrhea, unspecified: Secondary | ICD-10-CM | POA: Insufficient documentation

## 2014-02-19 DIAGNOSIS — I10 Essential (primary) hypertension: Secondary | ICD-10-CM | POA: Insufficient documentation

## 2014-02-19 DIAGNOSIS — Z8619 Personal history of other infectious and parasitic diseases: Secondary | ICD-10-CM | POA: Insufficient documentation

## 2014-02-19 DIAGNOSIS — Z7901 Long term (current) use of anticoagulants: Secondary | ICD-10-CM

## 2014-02-19 DIAGNOSIS — R1110 Vomiting, unspecified: Secondary | ICD-10-CM

## 2014-02-19 DIAGNOSIS — M549 Dorsalgia, unspecified: Secondary | ICD-10-CM | POA: Diagnosis not present

## 2014-02-19 HISTORY — DX: Cerebral infarction, unspecified: I63.9

## 2014-02-19 LAB — COMPREHENSIVE METABOLIC PANEL
ALBUMIN: 3.6 g/dL (ref 3.5–5.2)
ALK PHOS: 74 U/L (ref 39–117)
ALT: 13 U/L (ref 0–53)
ANION GAP: 8 (ref 5–15)
AST: 19 U/L (ref 0–37)
BUN: 22 mg/dL (ref 6–23)
CALCIUM: 8.3 mg/dL — AB (ref 8.4–10.5)
CO2: 22 mmol/L (ref 19–32)
Chloride: 101 mEq/L (ref 96–112)
Creatinine, Ser: 0.96 mg/dL (ref 0.50–1.35)
GFR calc non Af Amer: 84 mL/min — ABNORMAL LOW (ref >90)
Glucose, Bld: 112 mg/dL — ABNORMAL HIGH (ref 70–99)
POTASSIUM: 3.2 mmol/L — AB (ref 3.5–5.1)
SODIUM: 131 mmol/L — AB (ref 135–145)
TOTAL PROTEIN: 8.2 g/dL (ref 6.0–8.3)
Total Bilirubin: 0.9 mg/dL (ref 0.3–1.2)

## 2014-02-19 LAB — CBC WITH DIFFERENTIAL/PLATELET
BASOS PCT: 0 % (ref 0–1)
Basophils Absolute: 0 10*3/uL (ref 0.0–0.1)
EOS ABS: 0 10*3/uL (ref 0.0–0.7)
EOS PCT: 0 % (ref 0–5)
HCT: 37 % — ABNORMAL LOW (ref 39.0–52.0)
Hemoglobin: 12.1 g/dL — ABNORMAL LOW (ref 13.0–17.0)
Lymphocytes Relative: 18 % (ref 12–46)
Lymphs Abs: 1.6 10*3/uL (ref 0.7–4.0)
MCH: 28.8 pg (ref 26.0–34.0)
MCHC: 32.7 g/dL (ref 30.0–36.0)
MCV: 88.1 fL (ref 78.0–100.0)
MONOS PCT: 9 % (ref 3–12)
Monocytes Absolute: 0.7 10*3/uL (ref 0.1–1.0)
NEUTROS PCT: 73 % (ref 43–77)
Neutro Abs: 6.3 10*3/uL (ref 1.7–7.7)
PLATELETS: 184 10*3/uL (ref 150–400)
RBC: 4.2 MIL/uL — ABNORMAL LOW (ref 4.22–5.81)
RDW: 14.5 % (ref 11.5–15.5)
WBC: 8.6 10*3/uL (ref 4.0–10.5)

## 2014-02-19 LAB — LIPASE, BLOOD: LIPASE: 15 U/L (ref 11–59)

## 2014-02-19 LAB — PROTIME-INR
INR: 1.48 (ref 0.00–1.49)
PROTHROMBIN TIME: 18.1 s — AB (ref 11.6–15.2)

## 2014-02-19 LAB — I-STAT CG4 LACTIC ACID, ED: Lactic Acid, Venous: 0.71 mmol/L (ref 0.5–2.2)

## 2014-02-19 LAB — TROPONIN I

## 2014-02-19 MED ORDER — POTASSIUM CHLORIDE CRYS ER 20 MEQ PO TBCR
40.0000 meq | EXTENDED_RELEASE_TABLET | Freq: Once | ORAL | Status: AC
  Administered 2014-02-19: 40 meq via ORAL
  Filled 2014-02-19: qty 2

## 2014-02-19 MED ORDER — DIPHENOXYLATE-ATROPINE 2.5-0.025 MG PO TABS: 1.0000 | ORAL_TABLET | Freq: Four times a day (QID) | ORAL | Status: DC | PRN

## 2014-02-19 MED ORDER — AMLODIPINE BESYLATE 5 MG PO TABS
5.0000 mg | ORAL_TABLET | Freq: Once | ORAL | Status: AC
  Administered 2014-02-19: 5 mg via ORAL
  Filled 2014-02-19: qty 1

## 2014-02-19 MED ORDER — SODIUM CHLORIDE 0.9 % IV BOLUS (SEPSIS)
500.0000 mL | Freq: Once | INTRAVENOUS | Status: AC
  Administered 2014-02-19: 500 mL via INTRAVENOUS

## 2014-02-19 MED ORDER — HYDRALAZINE HCL 20 MG/ML IJ SOLN
20.0000 mg | Freq: Once | INTRAMUSCULAR | Status: AC
Start: 2014-02-19 — End: 2014-02-19
  Administered 2014-02-19: 20 mg via INTRAVENOUS
  Filled 2014-02-19: qty 1

## 2014-02-19 MED ORDER — ONDANSETRON HCL 4 MG/2ML IJ SOLN
4.0000 mg | Freq: Once | INTRAMUSCULAR | Status: AC
  Administered 2014-02-19: 4 mg via INTRAVENOUS
  Filled 2014-02-19: qty 2

## 2014-02-19 MED ORDER — HYDROMORPHONE HCL 1 MG/ML IJ SOLN
1.0000 mg | Freq: Once | INTRAMUSCULAR | Status: AC
  Administered 2014-02-19: 1 mg via INTRAVENOUS
  Filled 2014-02-19: qty 1

## 2014-02-19 MED ORDER — ONDANSETRON HCL 4 MG PO TABS: 4.0000 mg | ORAL_TABLET | Freq: Four times a day (QID) | ORAL | Status: DC

## 2014-02-19 NOTE — ED Provider Notes (Addendum)
CSN: [id number]     Arrival date & time 02/19/14  0110 History   None    Chief Complaint  Patient presents with  . Emesis  . Diarrhea     (Consider location/radiation/quality/duration/timing/severity/associated sxs/prior Treatment) HPI Comments: Patient presents to the ER for evaluation of nausea, vomiting and diarrhea. Patient reports that symptoms have been ongoing for 3 days. He has had some abdominal discomfort and cramping associated with the symptoms. He has had generalized weakness. There is no fever. He has not had chest pain, cough or shortness of breath. Patient does report that he has a history of hypertension, has been out of his medications for 1 week. He is brought to the ER by EMS. They have noticed elevated blood pressures. Patient reports that he did get his medications filled today, but has been vomiting it back up, has not been able to hold his medications down.  Patient is a 68 y.o. male presenting with vomiting and diarrhea.  Emesis Associated symptoms: abdominal pain and diarrhea   Diarrhea Associated symptoms: abdominal pain and vomiting   Associated symptoms: no fever     Past Medical History  Diagnosis Date  . Hypertension   . CVA (cerebral infarction)   . 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 Renal disorder   . Sepsis(995.91)   . Vascular insufficiency   . Stroke    Past Surgical History  Procedure Laterality Date  . Back surgery  2002/2009  . Laparotomy  12/18/2010    Procedure: EXPLORATORY LAPAROTOMY;  Surgeon: Donato Heinz;  Location: [facility];  Service: General;  Laterality: N/A;  . Partial colectomy  12/18/2010    Procedure: PARTIAL COLECTOMY;  Surgeon: Donato Heinz;  Location: [facility];  Service: General;  Laterality: N/A;   Family History  Problem Relation Age of Onset  . Colon cancer Neg Hx   . Liver disease Neg Hx    History  Substance Use Topics  . Smoking status: Current Every Day Smoker -- 0.25 packs/day for 40 years    Types:  Cigarettes  . Smokeless tobacco: Never Used     Comment: 2-3 cigarettes per day  . Alcohol Use: Yes    Review of Systems  Constitutional: Negative for fever.  Gastrointestinal: Positive for vomiting, abdominal pain and diarrhea.  All other systems reviewed and are negative.     Allergies  Review of patient's allergies indicates no known allergies.  Home Medications   Prior to Admission medications   Medication Sig Start Date End Date Taking? Authorizing Provider  acetaminophen (TYLENOL) 325 MG tablet Take 650 mg by mouth every 4 (four) hours as needed. For temp > 101.    Historical Provider, MD  albuterol (PROVENTIL) (5 MG/ML) 0.5% nebulizer solution Take 0.5 mLs (2.5 mg total) by nebulization every 4 (four) hours as needed for wheezing. 05/24/11 05/23/12  Bonnielee Haff, MD  amLODipine (NORVASC) 5 MG tablet Take 5 mg by mouth daily.    Historical Provider, MD  feeding supplement (ENSURE COMPLETE) LIQD Take 237 mLs by mouth 3 (three) times daily between meals. 05/24/11   Bonnielee Haff, MD  fentaNYL (DURAGESIC - DOSED MCG/HR) 25 MCG/HR Place 1 patch (25 mcg total) onto the skin every 3 (three) days. 05/24/11   Bonnielee Haff, MD  folic acid (FOLVITE) 1 MG tablet Take 1 mg by mouth daily.    Historical Provider, MD  meclizine (ANTIVERT) 25 MG tablet Take 25 mg by mouth 3 (three) times daily as needed.  Historical Provider, MD  metoprolol tartrate (LOPRESSOR) 25 MG tablet Take 12.5 mg by mouth 2 (two) times daily.    Historical Provider, MD  Multiple Vitamin (MULITIVITAMIN WITH MINERALS) TABS Take 1 tablet by mouth daily.    Historical Provider, MD  potassium chloride SA (K-DUR,KLOR-CON) 20 MEQ tablet Take 40 mEq by mouth daily.    Historical Provider, MD  promethazine (PHENERGAN) 25 MG tablet Take 25 mg by mouth every 6 (six) hours as needed. For nausea/vomiting.    Historical Provider, MD  saccharomyces boulardii (FLORASTOR) 250 MG capsule Take 250 mg by mouth 2 (two) times daily.     Historical Provider, MD  sevelamer (RENVELA) 800 MG tablet Take 2,400 mg by mouth 2 (two) times daily.    Historical Provider, MD  sevelamer (RENVELA) 800 MG tablet Take 800 mg by mouth 3 (three) times daily with meals.    Historical Provider, MD  warfarin (COUMADIN) 2.5 MG tablet Take 2 tablets (5 mg total) by mouth every evening. May need higher dose after he finishes the course of flagyl 05/24/11   Bonnielee Haff, MD   BP 161/113 mmHg  Pulse 102  Temp(Src) 97.5 F (36.4 C) (Oral)  Resp 22  Wt 148 lb (67.132 kg)  SpO2 100% Physical Exam  Constitutional: He is oriented to person, place, and time. He appears well-developed and well-nourished. No distress.  HENT:  Head: Normocephalic and atraumatic.  Right Ear: Hearing normal.  Left Ear: Hearing normal.  Nose: Nose normal.  Mouth/Throat: Oropharynx is clear and moist and mucous membranes are normal.  Eyes: Conjunctivae and EOM are normal. Pupils are equal, round, and reactive to light.  Neck: Normal range of motion. Neck supple.  Cardiovascular: Regular rhythm, S1 normal and S2 normal.  Exam reveals no gallop and no friction rub.   No murmur heard. Pulmonary/Chest: Effort normal and breath sounds normal. No respiratory distress. He exhibits no tenderness.  Abdominal: Soft. Normal appearance and bowel sounds are normal. There is no hepatosplenomegaly. There is no tenderness. There is no rebound, no guarding, no tenderness at McBurney's point and negative Murphy's sign. No hernia.  Musculoskeletal: Normal range of motion.  Neurological: He is alert and oriented to person, place, and time. He has normal strength. No cranial nerve deficit or sensory deficit. Coordination normal. GCS eye subscore is 4. GCS verbal subscore is 5. GCS motor subscore is 6.  Skin: Skin is warm, dry and intact. No rash noted. No cyanosis.  Psychiatric: He has a normal mood and affect. His speech is normal and behavior is normal. Thought content normal.  Nursing note  and vitals reviewed.   ED Course  Procedures (including critical care time) Labs Review Labs Reviewed  CBC WITH DIFFERENTIAL - Abnormal; Notable for the following:    RBC 4.20 (*)    Hemoglobin 12.1 (*)    HCT 37.0 (*)    All other components within normal limits  COMPREHENSIVE METABOLIC PANEL - Abnormal; Notable for the following:    Sodium 131 (*)    Potassium 3.2 (*)    Glucose, Bld 112 (*)    Calcium 8.3 (*)    GFR calc non Af Amer 84 (*)    All other components within normal limits  PROTIME-INR - Abnormal; Notable for the following:    Prothrombin Time 18.1 (*)    All other components within normal limits  LIPASE, BLOOD  TROPONIN I  URINALYSIS, ROUTINE W REFLEX MICROSCOPIC  URINE RAPID DRUG SCREEN (HOSP PERFORMED)  I-STAT CG4  LACTIC ACID, ED    Imaging Review Dg Abd Acute W/chest  02/19/2014   CLINICAL DATA:  Vomiting and diarrhea  EXAM: ACUTE ABDOMEN SERIES (ABDOMEN 2 VIEW & CHEST 1 VIEW)  COMPARISON:  04/12/2013  FINDINGS: There is no evidence of bowel obstruction or perforation. Bowel sutures are noted in the left abdomen. No concerning intra-abdominal mass effect or calcification.  Asymmetric density of the lungs which is likely related to overlapping soft tissue differences. There is no edema, consolidation, effusion, or pneumothorax. Heart size is normal and aortic tortuosity is unchanged. Rounded density overlapping the left 6th anterior rib is most consistent with a nipple shadow, especially in light of previous coned in the imaging of the lower chest where bilateral nipple shadows were noted.  IMPRESSION: Negative abdominal radiographs.  No acute cardiopulmonary disease.   Electronically Signed   By: Jorje Guild M.D.   On: 02/19/2014 03:33     EKG Interpretation   Date/Time:  Wednesday February 19 2014 01:15:09 EST Ventricular Rate:  60 PR Interval:  183 QRS Duration: 90 QT Interval:  471 QTC Calculation: 471 R Axis:   48 Text Interpretation:  Sinus  rhythm Probable left atrial enlargement RSR'  in V1 or V2, probably normal variant Left ventricular hypertrophy  Nonspecific T abnrm, anterolateral leads No significant change since last  tracing Confirmed by Maressa Apollo  MD, Janyce Ellinger [phone]) on 02/19/2014  1:17:46 AM      MDM   Final diagnoses:  Vomiting and diarrhea   hypertension  Patient presents to the ER for evaluation of nausea, vomiting and diarrhea. Patient has had symptoms for 3 days. He has not been able to take his medication because of the nausea and vomiting. Patient had a benign abdominal exam. Lab work was unremarkable. Patient administered hydralazine with some improvement of his blood pressure. He also has chronic pain issues, was administered Dilaudid for this. Patient has had improvement of his nausea and vomiting with treatment. With a normal workup and normal examination, patient is appropriate for discharge, follow-up with primary doctor.  Patient does have a previous history of C. difficile colitis. He has not been on any antibiotics recently. Patient has not had any diarrhea or bowel movements here in the ER for any testing. This can be deferred for outpatient with primary doctor.  Orpah Greek, MD 02/19/14 [place], MD 02/19/14 [phone]

## 2014-02-19 NOTE — Discharge Instructions (Signed)

## 2014-02-19 NOTE — ED Notes (Signed)
Per EMS, patient has been having vomiting and diarrhea for 3 days.  Patient got BP medication today and patient BP up.

## 2014-02-20 ENCOUNTER — Inpatient Hospital Stay (HOSPITAL_COMMUNITY): Payer: Medicare Other | Admitting: Certified Registered Nurse Anesthetist

## 2014-02-20 ENCOUNTER — Inpatient Hospital Stay (HOSPITAL_COMMUNITY): Payer: Medicare Other

## 2014-02-20 ENCOUNTER — Emergency Department (HOSPITAL_COMMUNITY): Payer: Medicare Other

## 2014-02-20 ENCOUNTER — Encounter (HOSPITAL_COMMUNITY): Payer: Self-pay | Admitting: *Deleted

## 2014-02-20 ENCOUNTER — Inpatient Hospital Stay (HOSPITAL_COMMUNITY)
Admission: EM | Admit: 2014-02-20 | Discharge: 2014-03-03 | DRG: 021 | Disposition: A | Discharge status: Rehabilitation Facility | Payer: Medicare Other | Attending: Neurosurgery | Admitting: Neurosurgery

## 2014-02-20 ENCOUNTER — Encounter (HOSPITAL_COMMUNITY)
Admission: EM | Disposition: A | Discharge status: Rehabilitation Facility | Payer: Self-pay | Source: Home / Self Care | Attending: Neurosurgery

## 2014-02-20 DIAGNOSIS — R51 Headache: Secondary | ICD-10-CM

## 2014-02-20 DIAGNOSIS — I608 Other nontraumatic subarachnoid hemorrhage: Secondary | ICD-10-CM | POA: Diagnosis present

## 2014-02-20 DIAGNOSIS — Z4659 Encounter for fitting and adjustment of other gastrointestinal appliance and device: Secondary | ICD-10-CM

## 2014-02-20 DIAGNOSIS — Z79899 Other long term (current) drug therapy: Secondary | ICD-10-CM

## 2014-02-20 DIAGNOSIS — G8194 Hemiplegia, unspecified affecting left nondominant side: Secondary | ICD-10-CM | POA: Diagnosis present

## 2014-02-20 DIAGNOSIS — I161 Hypertensive emergency: Secondary | ICD-10-CM

## 2014-02-20 DIAGNOSIS — G8929 Other chronic pain: Secondary | ICD-10-CM | POA: Diagnosis present

## 2014-02-20 DIAGNOSIS — S066XAA Traumatic subarachnoid hemorrhage with loss of consciousness status unknown, initial encounter: Secondary | ICD-10-CM | POA: Diagnosis present

## 2014-02-20 DIAGNOSIS — M25552 Pain in left hip: Secondary | ICD-10-CM | POA: Diagnosis present

## 2014-02-20 DIAGNOSIS — I639 Cerebral infarction, unspecified: Secondary | ICD-10-CM

## 2014-02-20 DIAGNOSIS — I10 Essential (primary) hypertension: Secondary | ICD-10-CM | POA: Diagnosis present

## 2014-02-20 DIAGNOSIS — I609 Nontraumatic subarachnoid hemorrhage, unspecified: Secondary | ICD-10-CM | POA: Diagnosis not present

## 2014-02-20 DIAGNOSIS — M542 Cervicalgia: Secondary | ICD-10-CM | POA: Diagnosis present

## 2014-02-20 DIAGNOSIS — D6859 Other primary thrombophilia: Secondary | ICD-10-CM | POA: Diagnosis present

## 2014-02-20 DIAGNOSIS — M25559 Pain in unspecified hip: Secondary | ICD-10-CM

## 2014-02-20 DIAGNOSIS — M5442 Lumbago with sciatica, left side: Secondary | ICD-10-CM

## 2014-02-20 DIAGNOSIS — M549 Dorsalgia, unspecified: Secondary | ICD-10-CM | POA: Diagnosis present

## 2014-02-20 DIAGNOSIS — Z8673 Personal history of transient ischemic attack (TIA), and cerebral infarction without residual deficits: Secondary | ICD-10-CM

## 2014-02-20 DIAGNOSIS — F17210 Nicotine dependence, cigarettes, uncomplicated: Secondary | ICD-10-CM | POA: Diagnosis present

## 2014-02-20 DIAGNOSIS — I606 Nontraumatic subarachnoid hemorrhage from other intracranial arteries: Principal | ICD-10-CM | POA: Diagnosis present

## 2014-02-20 DIAGNOSIS — S066X9A Traumatic subarachnoid hemorrhage with loss of consciousness of unspecified duration, initial encounter: Secondary | ICD-10-CM | POA: Diagnosis present

## 2014-02-20 DIAGNOSIS — R519 Headache, unspecified: Secondary | ICD-10-CM

## 2014-02-20 HISTORY — PX: RADIOLOGY WITH ANESTHESIA: SHX6223

## 2014-02-20 LAB — CBC WITH DIFFERENTIAL/PLATELET
Basophils Absolute: 0 10*3/uL (ref 0.0–0.1)
Basophils Relative: 0 % (ref 0–1)
EOS ABS: 0 10*3/uL (ref 0.0–0.7)
Eosinophils Relative: 0 % (ref 0–5)
HCT: 41.2 % (ref 39.0–52.0)
Hemoglobin: 13.6 g/dL (ref 13.0–17.0)
Lymphocytes Relative: 16 % (ref 12–46)
Lymphs Abs: 2.3 10*3/uL (ref 0.7–4.0)
MCH: 29 pg (ref 26.0–34.0)
MCHC: 33 g/dL (ref 30.0–36.0)
MCV: 87.8 fL (ref 78.0–100.0)
Monocytes Absolute: 1.5 10*3/uL — ABNORMAL HIGH (ref 0.1–1.0)
Monocytes Relative: 11 % (ref 3–12)
NEUTROS ABS: 10.6 10*3/uL — AB (ref 1.7–7.7)
Neutrophils Relative %: 73 % (ref 43–77)
PLATELETS: 220 10*3/uL (ref 150–400)
RBC: 4.69 MIL/uL (ref 4.22–5.81)
RDW: 14.7 % (ref 11.5–15.5)
WBC: 14.4 10*3/uL — ABNORMAL HIGH (ref 4.0–10.5)

## 2014-02-20 LAB — BASIC METABOLIC PANEL
ANION GAP: 6 (ref 5–15)
BUN: 22 mg/dL (ref 6–23)
CALCIUM: 9.2 mg/dL (ref 8.4–10.5)
CHLORIDE: 97 meq/L (ref 96–112)
CO2: 29 mmol/L (ref 19–32)
Creatinine, Ser: 1.15 mg/dL (ref 0.50–1.35)
GFR calc Af Amer: 75 mL/min — ABNORMAL LOW (ref >90)
GFR, EST NON AFRICAN AMERICAN: 65 mL/min — AB (ref >90)
GLUCOSE: 99 mg/dL (ref 70–99)
Potassium: 3.9 mmol/L (ref 3.5–5.1)
SODIUM: 132 mmol/L — AB (ref 135–145)

## 2014-02-20 LAB — APTT: APTT: 37 s (ref 24–37)

## 2014-02-20 LAB — TYPE AND SCREEN
ABO/RH(D): O POS
Antibody Screen: NEGATIVE

## 2014-02-20 LAB — PROTIME-INR
INR: 1.46 (ref 0.00–1.49)
PROTHROMBIN TIME: 17.9 s — AB (ref 11.6–15.2)

## 2014-02-20 SURGERY — RADIOLOGY WITH ANESTHESIA
Anesthesia: General

## 2014-02-20 MED ORDER — HYDROMORPHONE HCL 1 MG/ML IJ SOLN: 0.5000 mg | INTRAMUSCULAR | Status: AC | PRN

## 2014-02-20 MED ORDER — HYDROMORPHONE HCL 1 MG/ML IJ SOLN
1.0000 mg | Freq: Once | INTRAMUSCULAR | Status: AC
  Administered 2014-02-20: 1 mg via INTRAVENOUS
  Filled 2014-02-20: qty 1

## 2014-02-20 MED ORDER — GLYCOPYRROLATE 0.2 MG/ML IJ SOLN
INTRAMUSCULAR | Status: DC | PRN
  Administered 2014-02-20: 0.4 mg via INTRAVENOUS

## 2014-02-20 MED ORDER — PHENYLEPHRINE HCL 10 MG/ML IJ SOLN
10.0000 mg | INTRAVENOUS | Status: DC | PRN
  Administered 2014-02-20: 30 ug/min via INTRAVENOUS

## 2014-02-20 MED ORDER — PHENYLEPHRINE HCL 10 MG/ML IJ SOLN
INTRAMUSCULAR | Status: DC | PRN
  Administered 2014-02-20 (×2): 40 ug via INTRAVENOUS

## 2014-02-20 MED ORDER — LABETALOL HCL 5 MG/ML IV SOLN
10.0000 mg | INTRAVENOUS | Status: DC | PRN
  Administered 2014-02-22: 40 mg via INTRAVENOUS
  Administered 2014-02-22 – 2014-02-26 (×5): 20 mg via INTRAVENOUS
  Filled 2014-02-20 (×2): qty 8
  Filled 2014-02-20 (×3): qty 4
  Filled 2014-02-20: qty 8

## 2014-02-20 MED ORDER — LACTATED RINGERS IV SOLN
INTRAVENOUS | Status: DC | PRN
  Administered 2014-02-20: 21:00:00 via INTRAVENOUS

## 2014-02-20 MED ORDER — SODIUM CHLORIDE 0.9 % IV SOLN
INTRAVENOUS | Status: DC
  Administered 2014-02-20 – 2014-02-27 (×8): via INTRAVENOUS
  Administered 2014-02-28: 100 mL/h via INTRAVENOUS
  Administered 2014-03-01 – 2014-03-03 (×5): via INTRAVENOUS

## 2014-02-20 MED ORDER — IOHEXOL 300 MG/ML  SOLN
150.0000 mL | Freq: Once | INTRAMUSCULAR | Status: AC | PRN
  Administered 2014-02-20: 110 mL via INTRA_ARTERIAL

## 2014-02-20 MED ORDER — NIMODIPINE 30 MG PO CAPS
60.0000 mg | ORAL_CAPSULE | ORAL | Status: DC
  Administered 2014-02-21 – 2014-02-24 (×2): 60 mg via ORAL
  Filled 2014-02-20 (×70): qty 2

## 2014-02-20 MED ORDER — NICARDIPINE HCL IN NACL 20-0.86 MG/200ML-% IV SOLN
3.0000 mg/h | Freq: Once | INTRAVENOUS | Status: AC
  Administered 2014-02-20: 5 mg/h via INTRAVENOUS
  Filled 2014-02-20: qty 200

## 2014-02-20 MED ORDER — LIDOCAINE HCL (CARDIAC) 20 MG/ML IV SOLN
INTRAVENOUS | Status: DC | PRN
  Administered 2014-02-20: 50 mg via INTRAVENOUS

## 2014-02-20 MED ORDER — LABETALOL HCL 5 MG/ML IV SOLN
INTRAVENOUS | Status: DC | PRN
  Administered 2014-02-20: 5 mg via INTRAVENOUS

## 2014-02-20 MED ORDER — ROCURONIUM BROMIDE 100 MG/10ML IV SOLN
INTRAVENOUS | Status: DC | PRN
  Administered 2014-02-20: 50 mg via INTRAVENOUS
  Administered 2014-02-20 (×2): 10 mg via INTRAVENOUS

## 2014-02-20 MED ORDER — SUCCINYLCHOLINE CHLORIDE 20 MG/ML IJ SOLN
INTRAMUSCULAR | Status: DC | PRN
Start: 2014-02-20 — End: 2014-02-20
  Administered 2014-02-20: 100 mg via INTRAVENOUS

## 2014-02-20 MED ORDER — NIMODIPINE 60 MG/20ML PO SOLN
60.0000 mg | ORAL | Status: DC
  Administered 2014-02-21 – 2014-03-03 (×61): 60 mg
  Filled 2014-02-20 (×74): qty 20

## 2014-02-20 MED ORDER — ACETAMINOPHEN 325 MG PO TABS
650.0000 mg | ORAL_TABLET | ORAL | Status: DC | PRN
  Administered 2014-02-21 – 2014-03-03 (×5): 650 mg via ORAL
  Filled 2014-02-20 (×5): qty 2

## 2014-02-20 MED ORDER — PROPOFOL 10 MG/ML IV BOLUS
INTRAVENOUS | Status: DC | PRN
  Administered 2014-02-20: 100 mg via INTRAVENOUS
  Administered 2014-02-20: 30 mg via INTRAVENOUS
  Administered 2014-02-20: 50 mg via INTRAVENOUS

## 2014-02-20 MED ORDER — NICARDIPINE HCL IN NACL 20-0.86 MG/200ML-% IV SOLN
3.0000 mg/h | INTRAVENOUS | Status: DC
  Administered 2014-02-20 – 2014-02-21 (×2): 5 mg/h via INTRAVENOUS
  Filled 2014-02-20 (×2): qty 200

## 2014-02-20 MED ORDER — ACETAMINOPHEN 650 MG RE SUPP: 650.0000 mg | RECTAL | Status: DC | PRN

## 2014-02-20 MED ORDER — SODIUM CHLORIDE 0.9 % IV SOLN: Freq: Once | INTRAVENOUS | Status: DC

## 2014-02-20 MED ORDER — ONDANSETRON HCL 4 MG/2ML IJ SOLN: 4.0000 mg | Freq: Three times a day (TID) | INTRAMUSCULAR | Status: AC | PRN

## 2014-02-20 MED ORDER — IOHEXOL 350 MG/ML SOLN
150.0000 mL | Freq: Once | INTRAVENOUS | Status: AC | PRN
  Administered 2014-02-20: 150 mL via INTRAVENOUS

## 2014-02-20 MED ORDER — PANTOPRAZOLE SODIUM 40 MG IV SOLR
40.0000 mg | Freq: Every day | INTRAVENOUS | Status: DC
  Administered 2014-02-21 (×2): 40 mg via INTRAVENOUS
  Filled 2014-02-20 (×3): qty 40

## 2014-02-20 MED ORDER — SENNOSIDES-DOCUSATE SODIUM 8.6-50 MG PO TABS
1.0000 | ORAL_TABLET | Freq: Two times a day (BID) | ORAL | Status: DC
  Administered 2014-02-21 – 2014-03-02 (×6): 1 via ORAL
  Filled 2014-02-20 (×23): qty 1

## 2014-02-20 MED ORDER — FENTANYL CITRATE 0.05 MG/ML IJ SOLN
INTRAMUSCULAR | Status: DC | PRN
  Administered 2014-02-20 (×2): 50 ug via INTRAVENOUS
  Administered 2014-02-20: 100 ug via INTRAVENOUS
  Administered 2014-02-20 (×4): 50 ug via INTRAVENOUS

## 2014-02-20 MED ORDER — NICARDIPINE HCL IN NACL 20-0.86 MG/200ML-% IV SOLN
INTRAVENOUS | Status: DC | PRN
  Administered 2014-02-20: 2.5 mg/h via INTRAVENOUS
  Administered 2014-02-20: 3.5 mg/h via INTRAVENOUS

## 2014-02-20 MED ORDER — NEOSTIGMINE METHYLSULFATE 10 MG/10ML IV SOLN
INTRAVENOUS | Status: DC | PRN
  Administered 2014-02-20: 3 mg via INTRAVENOUS

## 2014-02-20 MED ORDER — SODIUM CHLORIDE 0.9 % IV BOLUS (SEPSIS)
500.0000 mL | Freq: Once | INTRAVENOUS | Status: AC
  Administered 2014-02-20: 500 mL via INTRAVENOUS

## 2014-02-20 MED ORDER — METOPROLOL TARTRATE 1 MG/ML IV SOLN
INTRAVENOUS | Status: DC | PRN
  Administered 2014-02-20: 1 mg via INTRAVENOUS

## 2014-02-20 MED ORDER — SODIUM CHLORIDE 0.9 % IV SOLN
Freq: Once | INTRAVENOUS | Status: AC
  Administered 2014-02-20: 19:00:00 via INTRAVENOUS

## 2014-02-20 MED ORDER — HYDROMORPHONE HCL 1 MG/ML IJ SOLN
0.5000 mg | Freq: Once | INTRAMUSCULAR | Status: AC
  Administered 2014-02-20: 0.5 mg via INTRAVENOUS
  Filled 2014-02-20: qty 1

## 2014-02-20 MED ORDER — SODIUM CHLORIDE 0.9 % IV SOLN
INTRAVENOUS | Status: AC
  Administered 2014-02-20: 19:00:00 via INTRAVENOUS

## 2014-02-20 MED ORDER — STROKE: EARLY STAGES OF RECOVERY BOOK
Freq: Once | Status: AC
  Administered 2014-02-21
  Filled 2014-02-20: qty 1

## 2014-02-20 NOTE — ED Notes (Signed)
[facility] is here to transport pt, report given to them by phone earlier

## 2014-02-20 NOTE — Anesthesia Preprocedure Evaluation (Signed)
Anesthesia Evaluation  Patient identified by MRN, date of birth, ID band Patient awake    Reviewed: Allergy & Precautions, H&P , NPO status , Patient's Chart, lab work & pertinent test results  Airway Mallampati: II  TM Distance: >3 FB Neck ROM: Full    Dental no notable dental hx. (+) Teeth Intact, Dental Advisory Given   Pulmonary Current Smoker,  breath sounds clear to auscultation  Pulmonary exam normal       Cardiovascular hypertension, Pt. on medications Rhythm:Regular Rate:Normal     Neuro/Psych CVA, Residual Symptoms negative psych ROS   GI/Hepatic negative GI ROS, Neg liver ROS, GERD-  Controlled,  Endo/Other  negative endocrine ROS  Renal/GU negative Renal ROS  negative genitourinary   Musculoskeletal   Abdominal   Peds  Hematology negative hematology ROS (+)   Anesthesia Other Findings   Reproductive/Obstetrics negative OB ROS                             Anesthesia Physical Anesthesia Plan  ASA: III and emergent  Anesthesia Plan: General   Post-op Pain Management:    Induction: Intravenous, Rapid sequence and Cricoid pressure planned  Airway Management Planned: Oral ETT  Additional Equipment: Arterial line  Intra-op Plan:   Post-operative Plan: Post-operative intubation/ventilation  Informed Consent: I have reviewed the patients History and Physical, chart, labs and discussed the procedure including the risks, benefits and alternatives for the proposed anesthesia with the patient or authorized representative who has indicated his/her understanding and acceptance.   Dental advisory given  Plan Discussed with: CRNA  Anesthesia Plan Comments:         Anesthesia Quick Evaluation

## 2014-02-20 NOTE — Sedation Documentation (Signed)
Right groin C/D/I no signs of hematoma or bleeding at this time.

## 2014-02-20 NOTE — ED Notes (Signed)
Pt on phone with daughter, pt states she had not had medication in 3 days.

## 2014-02-20 NOTE — ED Notes (Addendum)
Patient c/o head, neck,  Back and left hip pain started yesterday, seen here last night for same but discharged home; patient states the medicine the gave him last night helped the pain and he wants more of that

## 2014-02-20 NOTE — H&P (Signed)
CC:  Headache  HPI: Brian [name] is a 68 y.o. male transferred from [facility] after presenting with HA and neck pain. He does also have chronic back and leg pain. He says the pain started about 4 days ago. He apparently is a hypertensive and says he ran out of medications and hadn't taken them for a while.   Of note, he is a current smoker, and a hypertensive.  PMH: Past Medical History  Diagnosis Date  . Hypertension   . CVA (cerebral infarction)   . 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 Renal disorder   . Sepsis(995.91)   . Vascular insufficiency   . Stroke     PSH: Past Surgical History  Procedure Laterality Date  . Back surgery  2002/2009  . Laparotomy  12/18/2010    Procedure: EXPLORATORY LAPAROTOMY;  Surgeon: Donato Heinz;  Location: [facility];  Service: General;  Laterality: N/A;  . Partial colectomy  12/18/2010    Procedure: PARTIAL COLECTOMY;  Surgeon: Donato Heinz;  Location: [facility];  Service: General;  Laterality: N/A;    SH: History  Substance Use Topics  . Smoking status: Current Every Day Smoker -- 0.25 packs/day for 40 years    Types: Cigarettes  . Smokeless tobacco: Never Used     Comment: 2-3 cigarettes per day  . Alcohol Use: Yes    MEDS: Prior to Admission medications   Medication Sig Start Date End Date Taking? Authorizing Provider  acetaminophen (TYLENOL) 325 MG tablet Take 650 mg by mouth every 4 (four) hours as needed. For temp > 101.   Yes Historical Provider, MD  albuterol (PROVENTIL) (5 MG/ML) 0.5% nebulizer solution Take 0.5 mLs (2.5 mg total) by nebulization every 4 (four) hours as needed for wheezing. 05/24/11 02/20/14 Yes Bonnielee Haff, MD  amLODipine (NORVASC) 10 MG tablet Take 10 mg by mouth daily.   Yes Historical Provider, MD  doxepin (SINEQUAN) 25 MG capsule Take 50 mg by mouth at bedtime.   Yes Historical Provider, MD  HYDROcodone-acetaminophen (NORCO) 10-325 MG per tablet Take 1 tablet by mouth every 6 (six) hours as needed for  moderate pain or severe pain.   Yes Historical Provider, MD  tiZANidine (ZANAFLEX) 2 MG tablet Take 2 mg by mouth every 8 (eight) hours as needed for muscle spasms.   Yes Historical Provider, MD  traMADol (ULTRAM) 50 MG tablet Take 50-100 mg by mouth every 6 (six) hours as needed for moderate pain.   Yes Historical Provider, MD  triamterene-hydrochlorothiazide (DYAZIDE) 37.5-25 MG per capsule Take 1 capsule by mouth daily.   Yes Historical Provider, MD  diphenoxylate-atropine (LOMOTIL) 2.5-0.025 MG per tablet Take 1 tablet by mouth 4 (four) times daily as needed for diarrhea or loose stools. 02/19/14   Orpah Greek, MD  feeding supplement (ENSURE COMPLETE) LIQD Take 237 mLs by mouth 3 (three) times daily between meals. Patient not taking: Reported on 02/20/2014 05/24/11   Bonnielee Haff, MD  fentaNYL (DURAGESIC - DOSED MCG/HR) 25 MCG/HR Place 1 patch (25 mcg total) onto the skin every 3 (three) days. Patient not taking: Reported on 02/20/2014 05/24/11   Bonnielee Haff, MD  ondansetron (ZOFRAN) 4 MG tablet Take 1 tablet (4 mg total) by mouth every 6 (six) hours. Patient not taking: Reported on 02/20/2014 02/19/14   Orpah Greek, MD  warfarin (COUMADIN) 2.5 MG tablet Take 2 tablets (5 mg total) by mouth every evening. May need higher dose after he finishes the course of flagyl Patient  not taking: Reported on 02/20/2014 05/24/11   Bonnielee Haff, MD    ALLERGY: No Known Allergies  ROS: Review of Systems  Constitutional: Negative for fever and chills.  HENT: Negative for hearing loss.   Eyes: Negative for blurred vision and double vision.  Respiratory: Negative for cough and shortness of breath.   Cardiovascular: Negative for chest pain.  Gastrointestinal: Positive for nausea. Negative for heartburn.  Genitourinary: Negative for dysuria.  Musculoskeletal: Positive for back pain and neck pain. Negative for myalgias.  Skin: Negative for rash.  Neurological: Positive for  headaches. Negative for dizziness, tingling, sensory change and focal weakness.  Endo/Heme/Allergies: Does not bruise/bleed easily.    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 There is primarily right-sided sylvian and lateral convexity SAH. No HCP.  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 I explained to him that intracranial aneurysm was the most common non-traumatic cause for [facility] and that the definitive diagnosis is made by diagnostic angiogram. I also explained to them the possible treatment options for intracranial aneurysms including endovascular coiling and open clip ligation. The risks of the angiogram, coiling, and surgical clipping were also reviewed to include, but not limited to stroke and aneurysm re-rupture leading to weakness/paralysis/coma/death.  The patient appeared to understand our discussion and the provided consent to proceed with diagnostic angiogram and the appropriate treatment for any identified aneurysm.

## 2014-02-20 NOTE — Brief Op Note (Addendum)
PREOP DX: Subarachnoid Hemorrhage  POSTOP DX: Same  PROCEDURE: Diagnostic cerebral angiogram, Balloon assisted coil embolization RICA aneurysm  SURGEON: Dr. [name], MD  ANESTHESIA: GETA  EBL: Minimal  SPECIMENS: None  COMPLICATIONS: None immediate  CONDITION: Stable to neuro ICU  FINDINGS: 1. 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 2. No other aneurysms, AVM, or high-flow fistulas 3. Significantly prolonged mean transit time before and after coiling which may indicate significantly diminished cardiac output 4. Distal M3/M4 segment stasis seen post-coiling with small fronto-parietal perfusion deficit.  PLAN: 1. Will get cardiac echo tomorrow to assess EF 2. Allow SBP to be up to 168mmHg initially 3. Standard spasm mgmt/prophylaxis (Nimotop/Zocor, TCD)

## 2014-02-20 NOTE — ED Provider Notes (Signed)
CSN: [id number]     Arrival date & time 02/20/14  1309 History  This chart was scribed for Mariea Clonts, MD by Chester Holstein, ED Scribe. This patient was seen in room [facility]04[facility] and the patient's care was started at 1:29 PM.    Chief Complaint  Patient presents with  . Back Pain     The history is provided by the patient. No language interpreter was used.    HPI Comments: Brian [name] is a 68 y.o. male with PMHx of CVA who presents to the Emergency Department complaining of sharp achy hip pain with onset 5 days ago. Pt notes pain started in his head and neck and radiated down to his shoulders, back, hips, and legs. He notes pain is mostly on his left side. Pt notes the pain worsens with movement. Pt has taken hydrocodone without relief. Pt states he was without his Tramadol and triamterene. Pt notes he is no longer on Coumadin.  Pt was seen in the ED yesterday. Pt has NKDA. Pt denies any new numbness, abdominal pain, chest pain, and LOC. TP has a PCP.    Past Medical History  Diagnosis Date  . Hypertension   . CVA (cerebral infarction)   . 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 Renal disorder   . Sepsis(995.91)   . Vascular insufficiency   . Stroke    Past Surgical History  Procedure Laterality Date  . Back surgery  2002/2009  . Laparotomy  12/18/2010    Procedure: EXPLORATORY LAPAROTOMY;  Surgeon: Donato Heinz;  Location: [facility];  Service: General;  Laterality: N/A;  . Partial colectomy  12/18/2010    Procedure: PARTIAL COLECTOMY;  Surgeon: Donato Heinz;  Location: [facility];  Service: General;  Laterality: N/A;   Family History  Problem Relation Age of Onset  . Colon cancer Neg Hx   . Liver disease Neg Hx    History  Substance Use Topics  . Smoking status: Current Every Day Smoker -- 0.25 packs/day for 40 years    Types: Cigarettes  . Smokeless tobacco: Never Used     Comment: 2-3 cigarettes per day  . Alcohol Use: Yes    Review of Systems  Cardiovascular:  Negative for chest pain.  Gastrointestinal: Negative for abdominal pain.  Musculoskeletal: Positive for myalgias, back pain, arthralgias and neck pain.  Neurological: Positive for headaches. Negative for syncope.  All other systems reviewed and are negative.     Allergies  Review of patient's allergies indicates no known allergies.  Home Medications   Prior to Admission medications   Medication Sig Start Date End Date Taking? Authorizing Provider  acetaminophen (TYLENOL) 325 MG tablet Take 650 mg by mouth every 4 (four) hours as needed. For temp > 101.   Yes Historical Provider, MD  albuterol (PROVENTIL) (5 MG/ML) 0.5% nebulizer solution Take 0.5 mLs (2.5 mg total) by nebulization every 4 (four) hours as needed for wheezing. 05/24/11 02/20/14 Yes Bonnielee Haff, MD  amLODipine (NORVASC) 10 MG tablet Take 10 mg by mouth daily.   Yes Historical Provider, MD  doxepin (SINEQUAN) 25 MG capsule Take 50 mg by mouth at bedtime.   Yes Historical Provider, MD  HYDROcodone-acetaminophen (NORCO) 10-325 MG per tablet Take 1 tablet by mouth every 6 (six) hours as needed for moderate pain or severe pain.   Yes Historical Provider, MD  tiZANidine (ZANAFLEX) 2 MG tablet Take 2 mg by mouth every 8 (eight) hours as needed for muscle spasms.  Yes Historical Provider, MD  traMADol (ULTRAM) 50 MG tablet Take 50-100 mg by mouth every 6 (six) hours as needed for moderate pain.   Yes Historical Provider, MD  triamterene-hydrochlorothiazide (DYAZIDE) 37.5-25 MG per capsule Take 1 capsule by mouth daily.   Yes Historical Provider, MD  diphenoxylate-atropine (LOMOTIL) 2.5-0.025 MG per tablet Take 1 tablet by mouth 4 (four) times daily as needed for diarrhea or loose stools. 02/19/14   Orpah Greek, MD  feeding supplement (ENSURE COMPLETE) LIQD Take 237 mLs by mouth 3 (three) times daily between meals. Patient not taking: Reported on 02/20/2014 05/24/11   Bonnielee Haff, MD  fentaNYL (DURAGESIC - DOSED MCG/HR)  25 MCG/HR Place 1 patch (25 mcg total) onto the skin every 3 (three) days. Patient not taking: Reported on 02/20/2014 05/24/11   Bonnielee Haff, MD  ondansetron (ZOFRAN) 4 MG tablet Take 1 tablet (4 mg total) by mouth every 6 (six) hours. Patient not taking: Reported on 02/20/2014 02/19/14   Orpah Greek, MD  warfarin (COUMADIN) 2.5 MG tablet Take 2 tablets (5 mg total) by mouth every evening. May need higher dose after he finishes the course of flagyl Patient not taking: Reported on 02/20/2014 05/24/11   Bonnielee Haff, MD   BP 138/95 mmHg  Pulse 89  Temp(Src) 97.5 F (36.4 C) (Oral)  Resp 11  Ht 5' 7.5" (1.715 m)  Wt 148 lb (67.132 kg)  BMI 22.82 kg/m2  SpO2 100% Physical Exam  Constitutional: He is oriented to person, place, and time. He appears well-developed and well-nourished.  HENT:  Head: Normocephalic.  Eyes: Conjunctivae and EOM are normal. Pupils are equal, round, and reactive to light.  Visual abilities intact chronic scleral changes   Neck: Normal range of motion. Neck supple.  no meningismus pain with normal ROM  Cardiovascular: Regular rhythm and intact distal pulses.  Tachycardia present.   No murmur heard. Equal pulses in upper and lower extremities  Pulmonary/Chest: Effort normal.  Abdominal: Soft. There is tenderness (mild epigastric discomfort). There is no guarding.  Musculoskeletal: Normal range of motion. He exhibits no edema.       Cervical back: He exhibits tenderness (bilateral posterior cervical paraspinal).  Pain with flexion left hip no injuries Pain with external ROM Paraspinal left back tenderness, reproducible  Neurological: He is alert and oriented to person, place, and time. Tremors: .jzneuro. GCS eye subscore is 4. GCS verbal subscore is 5. GCS motor subscore is 6.  5+ strength in UE and LE with f/e at major joints. Sensation to palpation intact in UE and LE. CNs 2-12 grossly intact.  EOMFI.  PERRL.   Finger nose and coordination  intact bilateral.   Visual fields intact to finger testing.   Skin: Skin is warm and dry.  Psychiatric: He has a normal mood and affect. His behavior is normal.  Nursing note and vitals reviewed.   ED Course  Procedures (including critical care time) CRITICAL CARE Performed by: Mariea Clonts   Total critical care time: 75 min  Critical care time was exclusive of separately billable procedures and treating other patients.  Critical care was necessary to treat or prevent imminent or life-threatening deterioration.  Critical care was time spent personally by me on the following activities: development of treatment plan with patient and/or surrogate as well as nursing, discussions with consultants, evaluation of patient's response to treatment, examination of patient, obtaining history from patient or surrogate, ordering and performing treatments and interventions, ordering and review of laboratory studies, ordering  and review of radiographic studies, pulse oximetry and re-evaluation of patient's condition.  DIAGNOSTIC STUDIES: Oxygen Saturation is 100% on room air, normal by my interpretation.    COORDINATION OF CARE: 1:39 PM Discussed treatment plan with patient at beside, the patient agrees with the plan and has no further questions at this time.   Labs Review Labs Reviewed  CBC WITH DIFFERENTIAL - Abnormal; Notable for the following:    WBC 14.4 (*)    Neutro Abs 10.6 (*)    Monocytes Absolute 1.5 (*)    All other components within normal limits  PROTIME-INR - Abnormal; Notable for the following:    Prothrombin Time 17.9 (*)    All other components within normal limits  BASIC METABOLIC PANEL - Abnormal; Notable for the following:    Sodium 132 (*)    GFR calc non Af Amer 65 (*)    GFR calc Af Amer 75 (*)    All other components within normal limits  APTT    Imaging Review Dg Abd Acute W/chest  02/19/2014   CLINICAL DATA:  Vomiting and diarrhea  EXAM: ACUTE ABDOMEN  SERIES (ABDOMEN 2 VIEW & CHEST 1 VIEW)  COMPARISON:  04/12/2013  FINDINGS: There is no evidence of bowel obstruction or perforation. Bowel sutures are noted in the left abdomen. No concerning intra-abdominal mass effect or calcification.  Asymmetric density of the lungs which is likely related to overlapping soft tissue differences. There is no edema, consolidation, effusion, or pneumothorax. Heart size is normal and aortic tortuosity is unchanged. Rounded density overlapping the left 6th anterior rib is most consistent with a nipple shadow, especially in light of previous coned in the imaging of the lower chest where bilateral nipple shadows were noted.  IMPRESSION: Negative abdominal radiographs.  No acute cardiopulmonary disease.   Electronically Signed   By: Jorje Guild M.D.   On: 02/19/2014 03:33   Ct Angio Chest Aorta W/cm &/or Wo/cm  02/20/2014   CLINICAL DATA:  Initial encounter for back and left hip pain that started yesterday.  EXAM: CT ANGIOGRAPHY CHEST, ABDOMEN AND PELVIS  TECHNIQUE: Multidetector CT imaging through the chest, abdomen and pelvis was performed using the standard protocol during bolus administration of intravenous contrast. Multiplanar reconstructed images and MIPs were obtained and reviewed to evaluate the vascular anatomy.  CONTRAST:  177mL OMNIPAQUE IOHEXOL 350 MG/ML SOLN  COMPARISON:  Abdomen and pelvis CT from 05/20/2011.  FINDINGS: CTA CHEST FINDINGS  Soft tissue / Mediastinum: There is no axillary, mediastinal, or hilar lymphadenopathy. Thoracic esophagus is unremarkable. Heart size is normal. No evidence of pericardial effusion.  Vasculature: Pre contrast imaging shows no hyperdense crescent in the wall of the thoracic aorta to suggest the presence of an acute intramural hematoma. There is no dissection flap within the lumen of the thoracic aorta. Branch vessel origins from the aortic arch are normal in patent. There is no thoracic aortic aneurysm.  Lungs / Pleura: Lung  windows show paraseptal emphysema in the upper lobes, left greater than right. No pulmonary edema. No focal airspace consolidation. No pleural effusion. No worrisome parenchymal nodule or mass. There is some subsegmental atelectasis in the lingula and left lower lobe.  Bones: Bone windows reveal no worrisome lytic or sclerotic osseous lesions.  Review of the MIP images confirms the above findings.  CTA ABDOMEN AND PELVIS FINDINGS  Hepatobiliary: There is an area of abnormal enhancement in the posterior right hepatic dome without an underlying discrete mass lesion. Portal vein opacification in both the  right and left liver is heterogeneous in this may be related to early phase contrast bolus. Multiple mucosal folds are seen in the gallbladder, but I think there are also associated gallstones. No intrahepatic or extrahepatic biliary dilation.  Pancreas: No focal mass lesion. No dilatation of the main duct. No intraparenchymal cyst. No peripancreatic edema.  Spleen: No splenomegaly. No focal mass lesion.  Adrenals/Urinary Tract: No adrenal nodule or mass. No hydronephrosis or mass lesion seen in either kidney on this arterial phase study. No evidence for hydroureter. Urinary bladder has normal CT imaging features.  Stomach/Bowel: Stomach is nondistended. No gastric wall thickening. No evidence of outlet obstruction. Duodenum is normally positioned as is the ligament of Treitz. No small bowel wall thickening. No small bowel dilatation. Patient is status post right hemicolectomy. There is some diverticular change in the left colon without evidence of diverticulitis.  Vascular/Lymphatic: Atherosclerotic calcification is noted in the wall of the abdominal aorta without aneurysm. No evidence for dissection flap in the abdominal aorta. The celiac axis is widely patent. Superior mesenteric artery is widely patent. The main renal arteries are widely patent bilaterally inferior mesenteric artery is patent. Both common iliac  arteries are ectatic, measuring 1.5 cm in diameter on the right and 1.2 cm in diameter on the left.  No lymphadenopathy in the abdomen. No pelvic sidewall lymphadenopathy.  Reproductive: Prostate gland and seminal vesicles are unremarkable.  Other: No intraperitoneal free fluid.  Musculoskeletal: Advanced degenerative changes are noted in the lumbar spine, stable.  Review of the MIP images confirms the above findings.  IMPRESSION: 1. No evidence for thoracoabdominal aortic aneurysm or dissection. 2. No acute findings in the chest, abdomen, or pelvis. 3. Probable cholelithiasis   Electronically Signed   By: Misty Stanley M.D.   On: 02/20/2014 15:27     EKG Interpretation None      MDM   Final diagnoses:  Back pain  Headache  Hypertensive emergency  Subarachnoid hemorrhage due to ruptured aneurysm   I personally performed the services described in this documentation, which was scribed in my presence. The recorded information has been reviewed and is accurate.   patientpresents with worsening headache,  Left-sided back pain and neck pain.  With recurrent visit , elevated blood pressure and worsening pain that is new for the past 4-5 days CT scans ordered to look for dissection  And/or aneurysm.  Radiology called to discuss abnormalities, ruptured aneurysm noted on CT.  Patient's blood pressure remained elevated despite pain meds, Cardene ordered, repeat blood work , coag study and neurosurgery paged immediately. NSGY Dr Trenton Gammon rec immediate transfer to neuro ICU. On recheck mild fatigue appearance however pt feels no changes, mild headache.  blood pressure improved to 120s to 140s, discussed goal of 140 with nursing staff. The patients results and plan were reviewed and discussed.   Any x-rays performed were personally reviewed by myself.   Differential diagnosis were considered with the presenting HPI.  Medications  0.9 %  sodium chloride infusion (not administered)  0.9 %  sodium  chloride infusion (not administered)  HYDROmorphone (DILAUDID) injection 0.5 mg (not administered)  ondansetron (ZOFRAN) injection 4 mg (not administered)  HYDROmorphone (DILAUDID) injection 1 mg (1 mg Intravenous Given 02/20/14 1355)  sodium chloride 0.9 % bolus 500 mL (0 mLs Intravenous Stopped 02/20/14 1538)  iohexol (OMNIPAQUE) 350 MG/ML injection 150 mL (150 mLs Intravenous Contrast Given 02/20/14 1418)  nicardipine (CARDENE) 20mg  in 0.86% saline 282ml IV infusion (0.1 mg/ml) (5 mg/hr Intravenous New Bag/Given  02/20/14 1557)  HYDROmorphone (DILAUDID) injection 0.5 mg (0.5 mg Intravenous Given 02/20/14 1613)    Filed Vitals:   02/20/14 1615 02/20/14 1620 02/20/14 1625 02/20/14 1630  BP: 145/97 132/88 144/98 138/95  Pulse: 98 101 94 89  Temp:      TempSrc:      Resp: 16 19 14 11   Height:      Weight:      SpO2: 99% 100% 100% 100%    Final diagnoses:  Back pain  Headache  Hypertensive emergency  Subarachnoid hemorrhage due to ruptured aneurysm    Admission/ observation were discussed with the admitting physician, patient and/or family and they are comfortable with the plan.    Mariea Clonts, MD 02/21/14 2036

## 2014-02-20 NOTE — Transfer of Care (Signed)
Immediate Anesthesia Transfer of Care Note  Patient: Brian [name]  Procedure(s) Performed: Procedure(s): RADIOLOGY WITH ANESTHESIA (N/A)  Patient Location: ICU  Anesthesia Type:General  Level of Consciousness: sedated, patient cooperative and responds to stimulation  Airway & Oxygen Therapy: Patient Spontanous Breathing and Patient connected to nasal cannula oxygen  Post-op Assessment: Post -op Vital signs reviewed and stable and report to icu rn  Post vital signs: Reviewed and stable  Complications: No apparent anesthesia complications

## 2014-02-20 NOTE — Anesthesia Postprocedure Evaluation (Signed)
  Anesthesia Post-op Note  Patient: Brian [name]  Procedure(s) Performed: Procedure(s): RADIOLOGY WITH ANESTHESIA (N/A)  Patient Location: PACU  Anesthesia Type:General  Level of Consciousness: awake and alert   Airway and Oxygen Therapy: Patient Spontanous Breathing  Post-op Pain: mild  Post-op Assessment: Post-op Vital signs reviewed, Patient's Cardiovascular Status Stable and Respiratory Function Stable  Post-op Vital Signs: Reviewed  Filed Vitals:   02/20/14 2300  BP: 135/80  Pulse: 74  Temp:   Resp: 10    Complications: No apparent anesthesia complications

## 2014-02-21 ENCOUNTER — Inpatient Hospital Stay (HOSPITAL_COMMUNITY): Payer: Medicare Other

## 2014-02-21 ENCOUNTER — Encounter (HOSPITAL_COMMUNITY): Payer: Self-pay | Admitting: Radiology

## 2014-02-21 LAB — MRSA PCR SCREENING: MRSA by PCR: POSITIVE — AB

## 2014-02-21 MED ORDER — INFLUENZA VAC SPLIT QUAD 0.5 ML IM SUSY
0.5000 mL | PREFILLED_SYRINGE | INTRAMUSCULAR | Status: AC
  Administered 2014-02-22: 0.5 mL via INTRAMUSCULAR
  Filled 2014-02-21: qty 0.5

## 2014-02-21 MED ORDER — MUPIROCIN 2 % EX OINT
1.0000 "application " | TOPICAL_OINTMENT | Freq: Two times a day (BID) | CUTANEOUS | Status: AC
  Administered 2014-02-21 – 2014-02-25 (×10): 1 via NASAL
  Filled 2014-02-21 (×2): qty 22

## 2014-02-21 MED ORDER — CHLORHEXIDINE GLUCONATE CLOTH 2 % EX PADS
6.0000 | MEDICATED_PAD | Freq: Every day | CUTANEOUS | Status: AC
  Administered 2014-02-21 – 2014-02-25 (×5): 6 via TOPICAL

## 2014-02-21 NOTE — Evaluation (Signed)
Clinical/Bedside Swallow Evaluation Patient Details  Name: Brian [name] MRN: [id number] Date of Birth: 06/30/1945  Today's Date: 02/21/2014 Time: 3875-6433 SLP Time Calculation (min) (ACUTE ONLY): 20 min  Past Medical History:  Past Medical History  Diagnosis Date  . Hypertension   . CVA (cerebral infarction)   . 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 Renal disorder   . Sepsis(995.91)   . Vascular insufficiency   . Stroke    Past Surgical History:  Past Surgical History  Procedure Laterality Date  . Back surgery  2002/2009  . Laparotomy  12/18/2010    Procedure: EXPLORATORY LAPAROTOMY;  Surgeon: Donato Heinz;  Location: [facility];  Service: General;  Laterality: N/A;  . 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 PMH of HTN, CVA, colitis, renal disorder, sepsis.    Assessment / Plan / Recommendation Clinical Impression  Patient presents with a mild-moderate oral phase dysphagia characterized by delayed and discoordinated oral transit of bolus due to combination of left sided oral weakness and decreased awareness and resulting in anterior labial spillage of liquids and moderate left sided buccal pocketing of solids. SLP provided straw which aided in improved labial seal and decreased anterior spillage of liquids as well as moderate verbal and visual cueing for use of lingual sweep which effectively cleared residuals along with puree wash. Education complete with patient regarding diet recommendations, risk of aspiration, and use of compensatory strategies. Will initiate diet and f/u for treatment at bedside. Also noted is significant left sided neglect vs inattention and decreased awareness. Recommend cognitive evaluation. MD please order if agree.     Aspiration  Risk  Moderate    Diet Recommendation Dysphagia 2 (Fine chop);Thin liquid   Liquid Administration via: Straw Medication Administration: Crushed with puree Supervision: Patient able to self feed;Full supervision/cueing for compensatory strategies Compensations: Slow rate;Small sips/bites;Check for pocketing Postural Changes and/or Swallow Maneuvers: Seated upright 90 degrees    Other  Recommendations Oral Care Recommendations: Oral care BID   Follow Up Recommendations  Inpatient Rehab    Frequency and Duration min 2x/week  1 week        Swallow Study    General HPI: 69 year old male admitted with 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 PMH of HTN, CVA, colitis, renal disorder, sepsis.  Type of Study: Bedside swallow evaluation Previous Swallow Assessment: none Diet Prior to this Study: NPO Temperature Spikes Noted: No Respiratory Status: Room air History of Recent Intubation: No Behavior/Cognition: Alert;Cooperative;Pleasant mood Oral Cavity - Dentition: Poor condition;Missing dentition Self-Feeding Abilities: Able to feed self Patient Positioning: Upright in bed Baseline Vocal Quality: Clear Volitional Cough: Strong Volitional Swallow: Able to elicit    Oral/Motor/Sensory Function Overall Oral Motor/Sensory Function: Impaired Labial ROM: Reduced left Labial Symmetry: Within Functional Limits Labial Strength: Reduced Labial Sensation: Reduced (vs poor awareness) Lingual ROM: Within Functional Limits Lingual Symmetry: Within Functional Limits Lingual Strength: Reduced Lingual Sensation: Within Functional Limits Facial ROM: Reduced left Facial Symmetry: Left droop (subtle) Facial Strength: Within Functional Limits Facial Sensation: Within Functional Limits Velum: Within Functional Limits Mandible: Within Functional Limits   Ice Chips Ice chips: Not tested   Thin Liquid Thin Liquid: Impaired Presentation: Cup;Self  Fed;Straw Oral Phase Impairments: Reduced labial seal Oral Phase Functional Implications: Left  anterior spillage (via cup, improved with use of straw)    Nectar Thick Nectar Thick Liquid: Not tested   Honey Thick Honey Thick Liquid: Not tested   Puree Puree: Within functional limits Presentation: Spoon   Solid   GO    Solid: Impaired Presentation: Self Fed Oral Phase Impairments: Reduced lingual movement/coordination;Impaired anterior to posterior transit;Impaired mastication Oral Phase Functional Implications: Left lateral sulci pocketing;Oral residue      [employer] MA, CCC-SLP [phone]  Giada Schoppe Meryl 02/21/2014,9:24 AM

## 2014-02-21 NOTE — Progress Notes (Signed)
Patient ID: Brian [name], male   DOB: 1945-10-12, 69 y.o.   MRN: [id number] Subjective:  The patient is alert and pleasant. He has some left-sided neglect and left hemiparesthesias. She is in no distress.  Objective: Vital signs in last 24 hours: Temp:  [97.5 F (36.4 C)-98.3 F (36.8 C)] 98.3 F (36.8 C) (01/01 0800) Pulse Rate:  [68-111] 95 (01/01 0730) Resp:  [9-23] 14 (01/01 0730) BP: (115-172)/(70-119) 143/82 mmHg (01/01 0730) SpO2:  [98 %-100 %] 99 % (01/01 0730) Weight:  [67.132 kg (148 lb)] 67.132 kg (148 lb) (12/31 1303)  Intake/Output from previous day: 12/31 0701 - 01/01 0700 In: 2100 [I.V.:2100] Out: 2275 [Urine:2225; Blood:50] Intake/Output this shift:    Physical exam the patient is alert and oriented 3. His pupils are equal. He follows commands bilaterally. He has left-sided neglect and left hemiparesis.  The patient's follow-up head CT this morning demonstrates no obvious infarct. He has a right subarachnoid hemorrhage and aneurysm has been coiled.  Lab Results:  Recent Labs  02/19/14 0205 02/20/14 1533  WBC 8.6 14.4*  HGB 12.1* 13.6  HCT 37.0* 41.2  PLT 184 220   BMET  Recent Labs  02/19/14 0205 02/20/14 1533  NA 131* 132*  K 3.2* 3.9  CL 101 97  CO2 22 29  GLUCOSE 112* 99  BUN 22 22  CREATININE 0.96 1.15  CALCIUM 8.3* 9.2    Studies/Results: Ct Head Wo Contrast  02/21/2014   CLINICAL DATA:  Altered mental status. 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  EXAM: CT HEAD WITHOUT CONTRAST  TECHNIQUE: Contiguous axial images were obtained from the base of the skull through the vertex without intravenous contrast.  COMPARISON:  CT of the head February 20, 5973  FINDINGS: Embolic material within RIGHT carotid terminus results in streak artifact, limiting evaluation. Increasing density within the RIGHT sylvian fissure, to lesser extent frontoparietal sulci. Small amount intravenous  contrast.  Mild ventriculomegaly, unchanged from prior imaging, no intraventricular hemorrhage. Diffuse mild sulcal effacement, effaced basal cisterns. No acute large vascular territory infarct. No midline shift.  Atretic RIGHT maxillary sinus suggest sequelae of chronic sinusitis. No paranasal sinus air-fluid levels. Mild paranasal sinus mucosal thickening. Mastoid air cells are well-aerated. No skull fracture.  IMPRESSION: Increasing extra-axial density suggesting blood products and contrast. Increasing basal cistern of effacement concerning for component of global parenchymal edema.  No acute large vascular territory infarct.   Electronically Signed   By: Brian [name]   On: 02/21/2014 01:31   Ct Head Wo Contrast  02/20/2014   CLINICAL DATA:  Initial evaluation for headache.  EXAM: CT HEAD WITHOUT CONTRAST  TECHNIQUE: Contiguous axial images were obtained from the base of the skull through the vertex without intravenous contrast.  COMPARISON:  Prior study from 10/31/2012.  FINDINGS: An ovoid hyperdense lesion measuring 11 x 10 mm present in the region of the right ICA terminus is concerning for aneurysm. A portion of this hyperdensity appears more fusiform in shape extending along the proximal right M1 segment. Scattered hyperdensity within the right frontotemporal region and within the right sylvian fissure concerning for acute subarachnoid hemorrhage, likely related to aneurysm rupture. No extra-axial fluid collection. No intraventricular hemorrhage identified.  No hydrocephalus.  No acute large vessel territory infarct.  No acute abnormality seen about the orbits.  Calvarium intact. Paranasal sinuses and mastoid air cells are clear.  IMPRESSION: 11 x 10 mm hyperdense lesion in the region of the right ICA  terminus, concerning for aneurysm. Scattered hyperdensity within the right frontotemporal region and right sylvian fissure most consistent with acute subarachnoid hemorrhage, likely related to aneurysm  rupture.  Critical Value/emergent results were called by telephone at the time of interpretation on 02/20/2014 at 3:19 pm to Dr. Elnora Morrison , who verbally acknowledged these results.   Electronically Signed   By: Brian [name] M.[name].   On: 02/20/2014 15:37   Ct Angio Neck W/cm &/or Wo/cm  02/20/2014   CLINICAL DATA:  Initial evaluation for acute head and neck pain.  EXAM: CT ANGIOGRAPHY NECK  TECHNIQUE: Multidetector CT imaging of the neck was performed using the standard protocol during bolus administration of intravenous contrast. Multiplanar CT image reconstructions and MIPs were obtained to evaluate the vascular anatomy. Carotid stenosis measurements (when applicable) are obtained utilizing NASCET criteria, using the distal internal carotid diameter as the denominator.  CONTRAST:  129mL OMNIPAQUE IOHEXOL 350 MG/ML SOLN  COMPARISON:  None available.  FINDINGS: Aortic arch: Partially visualized aortic arch is somewhat ectatic without definite aneurysm. Origin of the great vessels not included on the study provided. Scattered calcified plaque present within the proximal left subclavian artery without significant stenosis. Subclavian arteries otherwise unremarkable.  Right carotid system: Proximal right common carotid artery mildly tortuous. Right common carotid artery widely patent without significant stenosis. Scattered calcified plaque present about the right carotid bifurcation and proximal right internal carotid artery without hemodynamically significant stenosis. Right ICA is mildly tortuous but widely patent to the level of the cavernous sinus. Right ICA terminus aneurysm seen on corresponding head CT in not included on this exam.  Right external carotid artery and its branches are within normal limits.  Left carotid system: Proximal left common carotid artery is mildly tortuous there is scattered calcified and noncalcified plaque about the proximal left common carotid artery without  hemodynamically significant stenosis. Minimal plaque noted about the left carotid bifurcation and proximal left internal carotid artery without significant stenosis. Left ICA is mildly tortuous but well opacified and widely patent to the level of the cavernous sinus.  Left external carotid artery and its branches are within normal limits.  Vertebral arteries:Both vertebral arteries arise from the subclavian arteries. The left vertebral artery is slightly dominant. Vertebral arteries well opacified to the level of the vertebrobasilar junction without significant stenosis, dissection, or occlusion.  No acute soft tissue abnormality identified within the neck. Thyroid gland is normal. No adenopathy. No mass lesion or loculated fluid collection.  Paraseptal and centrilobular emphysema noted within the bilateral lung apices, left greater than right.  Advanced multilevel degenerative disc disease noted throughout the cervical spine. No worrisome lytic or blastic osseous lesion. No acute osseous abnormality.  IMPRESSION: 1. No hemodynamically significant stenosis or acute abnormality identified within the major arterial vasculature of the neck. 2. 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   Electronically Signed   By: Brian [name] M.[name].   On: 02/20/2014 15:50   Dg Abd Portable 1v  02/21/2014   CLINICAL DATA:  Nasogastric tube placement  EXAM: PORTABLE ABDOMEN - 1 VIEW  COMPARISON:  None.  FINDINGS: Feeding tube tip in the proximal stomach. The bowel gas pattern is nonobstructive. The lung bases are clear.  IMPRESSION: Feeding tube tip in the proximal stomach.   Electronically Signed   By: Brian [name] M.[name].   On: 02/21/2014 03:13   Ct Angio Chest Aorta W/cm &/or Wo/cm  02/20/2014   ADDENDUM REPORT: 02/20/2014 17:56  ADDENDUM: With regards to the  area of abnormal enhancement in the posterior right hepatic dome, this may well represent a vascular malformation. MRI without and  with contrast could be used to further evaluate and exclude underlying mass lesion.   Electronically Signed   By: Brian [name] M.[name].   On: 02/20/2014 17:56   02/20/2014   CLINICAL DATA:  Initial encounter for back and left hip pain that started yesterday.  EXAM: CT ANGIOGRAPHY CHEST, ABDOMEN AND PELVIS  TECHNIQUE: Multidetector CT imaging through the chest, abdomen and pelvis was performed using the standard protocol during bolus administration of intravenous contrast. Multiplanar reconstructed images and MIPs were obtained and reviewed to evaluate the vascular anatomy.  CONTRAST:  170mL OMNIPAQUE IOHEXOL 350 MG/ML SOLN  COMPARISON:  Abdomen and pelvis CT from 05/20/2011.  FINDINGS: CTA CHEST FINDINGS  Soft tissue / Mediastinum: There is no axillary, mediastinal, or hilar lymphadenopathy. Thoracic esophagus is unremarkable. Heart size is normal. No evidence of pericardial effusion.  Vasculature: Pre contrast imaging shows no hyperdense crescent in the wall of the thoracic aorta to suggest the presence of an acute intramural hematoma. There is no dissection flap within the lumen of the thoracic aorta. Branch vessel origins from the aortic arch are normal in patent. There is no thoracic aortic aneurysm.  Lungs / Pleura: Lung windows show paraseptal emphysema in the upper lobes, left greater than right. No pulmonary edema. No focal airspace consolidation. No pleural effusion. No worrisome parenchymal nodule or mass. There is some subsegmental atelectasis in the lingula and left lower lobe.  Bones: Bone windows reveal no worrisome lytic or sclerotic osseous lesions.  Review of the MIP images confirms the above findings.  CTA ABDOMEN AND PELVIS FINDINGS  Hepatobiliary: There is an area of abnormal enhancement in the posterior right hepatic dome without an underlying discrete mass lesion. Portal vein opacification in both the right and left liver is heterogeneous in this may be related to early phase contrast bolus.  Multiple mucosal folds are seen in the gallbladder, but I think there are also associated gallstones. No intrahepatic or extrahepatic biliary dilation.  Pancreas: No focal mass lesion. No dilatation of the main duct. No intraparenchymal cyst. No peripancreatic edema.  Spleen: No splenomegaly. No focal mass lesion.  Adrenals/Urinary Tract: No adrenal nodule or mass. No hydronephrosis or mass lesion seen in either kidney on this arterial phase study. No evidence for hydroureter. Urinary bladder has normal CT imaging features.  Stomach/Bowel: Stomach is nondistended. No gastric wall thickening. No evidence of outlet obstruction. Duodenum is normally positioned as is the ligament of Treitz. No small bowel wall thickening. No small bowel dilatation. Patient is status post right hemicolectomy. There is some diverticular change in the left colon without evidence of diverticulitis.  Vascular/Lymphatic: Atherosclerotic calcification is noted in the wall of the abdominal aorta without aneurysm. No evidence for dissection flap in the abdominal aorta. The celiac axis is widely patent. Superior mesenteric artery is widely patent. The main renal arteries are widely patent bilaterally inferior mesenteric artery is patent. Both common iliac arteries are ectatic, measuring 1.5 cm in diameter on the right and 1.2 cm in diameter on the left.  No lymphadenopathy in the abdomen. No pelvic sidewall lymphadenopathy.  Reproductive: Prostate gland and seminal vesicles are unremarkable.  Other: No intraperitoneal free fluid.  Musculoskeletal: Advanced degenerative changes are noted in the lumbar spine, stable.  Review of the MIP images confirms the above findings.  IMPRESSION: 1. No evidence for thoracoabdominal aortic aneurysm or dissection. 2. No acute findings  in the chest, abdomen, or pelvis. 3. Probable cholelithiasis  Electronically Signed: By: Brian [name] M.[name]. On: 02/20/2014 15:27   Ct Angio Abd/pel W/ And/or W/o  02/20/2014    ADDENDUM REPORT: 02/20/2014 17:56  ADDENDUM: With regards to the area of abnormal enhancement in the posterior right hepatic dome, this may well represent a vascular malformation. MRI without and with contrast could be used to further evaluate and exclude underlying mass lesion.   Electronically Signed   By: Brian [name] M.[name].   On: 02/20/2014 17:56   02/20/2014   CLINICAL DATA:  Initial encounter for back and left hip pain that started yesterday.  EXAM: CT ANGIOGRAPHY CHEST, ABDOMEN AND PELVIS  TECHNIQUE: Multidetector CT imaging through the chest, abdomen and pelvis was performed using the standard protocol during bolus administration of intravenous contrast. Multiplanar reconstructed images and MIPs were obtained and reviewed to evaluate the vascular anatomy.  CONTRAST:  113mL OMNIPAQUE IOHEXOL 350 MG/ML SOLN  COMPARISON:  Abdomen and pelvis CT from 05/20/2011.  FINDINGS: CTA CHEST FINDINGS  Soft tissue / Mediastinum: There is no axillary, mediastinal, or hilar lymphadenopathy. Thoracic esophagus is unremarkable. Heart size is normal. No evidence of pericardial effusion.  Vasculature: Pre contrast imaging shows no hyperdense crescent in the wall of the thoracic aorta to suggest the presence of an acute intramural hematoma. There is no dissection flap within the lumen of the thoracic aorta. Branch vessel origins from the aortic arch are normal in patent. There is no thoracic aortic aneurysm.  Lungs / Pleura: Lung windows show paraseptal emphysema in the upper lobes, left greater than right. No pulmonary edema. No focal airspace consolidation. No pleural effusion. No worrisome parenchymal nodule or mass. There is some subsegmental atelectasis in the lingula and left lower lobe.  Bones: Bone windows reveal no worrisome lytic or sclerotic osseous lesions.  Review of the MIP images confirms the above findings.  CTA ABDOMEN AND PELVIS FINDINGS  Hepatobiliary: There is an area of abnormal enhancement in the posterior  right hepatic dome without an underlying discrete mass lesion. Portal vein opacification in both the right and left liver is heterogeneous in this may be related to early phase contrast bolus. Multiple mucosal folds are seen in the gallbladder, but I think there are also associated gallstones. No intrahepatic or extrahepatic biliary dilation.  Pancreas: No focal mass lesion. No dilatation of the main duct. No intraparenchymal cyst. No peripancreatic edema.  Spleen: No splenomegaly. No focal mass lesion.  Adrenals/Urinary Tract: No adrenal nodule or mass. No hydronephrosis or mass lesion seen in either kidney on this arterial phase study. No evidence for hydroureter. Urinary bladder has normal CT imaging features.  Stomach/Bowel: Stomach is nondistended. No gastric wall thickening. No evidence of outlet obstruction. Duodenum is normally positioned as is the ligament of Treitz. No small bowel wall thickening. No small bowel dilatation. Patient is status post right hemicolectomy. There is some diverticular change in the left colon without evidence of diverticulitis.  Vascular/Lymphatic: Atherosclerotic calcification is noted in the wall of the abdominal aorta without aneurysm. No evidence for dissection flap in the abdominal aorta. The celiac axis is widely patent. Superior mesenteric artery is widely patent. The main renal arteries are widely patent bilaterally inferior mesenteric artery is patent. Both common iliac arteries are ectatic, measuring 1.5 cm in diameter on the right and 1.2 cm in diameter on the left.  No lymphadenopathy in the abdomen. No pelvic sidewall lymphadenopathy.  Reproductive: Prostate gland and seminal vesicles are unremarkable.  Other: No intraperitoneal free fluid.  Musculoskeletal: Advanced degenerative changes are noted in the lumbar spine, stable.  Review of the MIP images confirms the above findings.  IMPRESSION: 1. No evidence for thoracoabdominal aortic aneurysm or dissection. 2. No  acute findings in the chest, abdomen, or pelvis. 3. Probable cholelithiasis  Electronically Signed: By: Brian [name] M.[name]. On: 02/20/2014 15:27    Assessment/Plan: Aneurysmal subarachnoid hemorrhage: We will continue supportive care. It's a bit early for vasospasm.  LOS: 1 day     Brian [name] 02/21/2014, 8:23 AM

## 2014-02-21 NOTE — Progress Notes (Signed)
Attempt to give pt Nimotop sublingual per Dr. Annette Stable. Pt coughs after attempts.  Pt awakens more easily to sound but does not speak. Paged Dr. Annette Stable. Informed on Nimotop concerns and also Pt's new NIH and present condition. Order for Head scan and Panda placement.

## 2014-02-22 DIAGNOSIS — I606 Nontraumatic subarachnoid hemorrhage from other intracranial arteries: Secondary | ICD-10-CM | POA: Diagnosis not present

## 2014-02-22 LAB — CLOSTRIDIUM DIFFICILE BY PCR: CDIFFPCR: NEGATIVE

## 2014-02-22 MED ORDER — ENSURE COMPLETE PO LIQD
237.0000 mL | Freq: Two times a day (BID) | ORAL | Status: DC
  Administered 2014-02-22 – 2014-02-26 (×8): 237 mL via ORAL

## 2014-02-22 MED ORDER — PANTOPRAZOLE SODIUM 40 MG PO PACK
40.0000 mg | PACK | Freq: Every day | ORAL | Status: DC
  Administered 2014-02-22 – 2014-02-28 (×7): 40 mg
  Filled 2014-02-22 (×9): qty 20

## 2014-02-22 NOTE — Progress Notes (Signed)
INITIAL NUTRITION ASSESSMENT  DOCUMENTATION CODES Per approved criteria  -Not Applicable   INTERVENTION: Provide Ensure Complete po BID, each supplement provides 350 kcal and 13 grams of protein RD to continue to monitor   NUTRITION DIAGNOSIS: Predicted sub optimal energy intake related to recent [place] as evidenced by 50% meal completion.   Goal: Pt to meet >/= 90% of their estimated nutrition needs   Monitor:  PO intake, weight trend, labs  Reason for Assessment: Malnutrition Screening Tool, score of 2  69 y.o. male  Admitting Dx: <principal problem not specified>  ASSESSMENT: 69 y.o. male transferred from [facility] after presenting with HA and neck pain. 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   Per Malnutrition Screening Tool, pt reported unintentional weight loss PTA. Per PTA medication list, pt has not been taking Ensure Complete 3 times daily. Pt asleep at time of visit; appears well-nourished. Per nursing notes, pt consumed 50% of dinner last night. No evidence of recent weight loss per weight history. Labs: low sodium  Height: Ht Readings from Last 1 Encounters:  02/20/14 5' 7.5" (1.715 m)    Weight: Wt Readings from Last 1 Encounters:  02/20/14 148 lb (67.132 kg)    Ideal Body Weight: 150 lbs  % Ideal Body Weight: 98%  Wt Readings from Last 10 Encounters:  02/20/14 148 lb (67.132 kg)  02/19/14 148 lb (67.132 kg)  10/30/12 135 lb (61.236 kg)  09/29/11 140 lb 1.6 oz (63.549 kg)  05/21/11 124 lb 9 oz (56.5 kg)  04/20/11 115 lb (52.164 kg)  12/24/10 179 lb 14.4 oz (81.602 kg)    Usual Body Weight: unknown  % Usual Body Weight: NA  BMI:  Body mass index is 22.82 kg/(m^2).  Estimated Nutritional Needs: Kcal: 1650-1850 Protein: 80-90 grams Fluid: 1.7-1.9 L/day  Skin: closed surgical wound on right groin  Diet Order: DIET DYS 2, thin liquids  EDUCATION NEEDS: -No education needs identified at this time   Intake/Output Summary (Last  24 hours) at 02/22/14 1228 Last data filed at 02/22/14 1000  Gross per 24 hour  Intake   2400 ml  Output   3050 ml  Net   -650 ml    Last BM: WDL   Labs:   Recent Labs Lab 02/19/14 0205 02/20/14 1533  NA 131* 132*  K 3.2* 3.9  CL 101 97  CO2 22 29  BUN 22 22  CREATININE 0.96 1.15  CALCIUM 8.3* 9.2  GLUCOSE 112* 99    CBG (last 3)  No results for input(s): GLUCAP in the last 72 hours.  Scheduled Meds: . sodium chloride   Intravenous Once  . Chlorhexidine Gluconate Cloth  6 each Topical Q0600  . Influenza vac split quadrivalent PF  0.5 mL Intramuscular Tomorrow-1000  . mupirocin ointment  1 application Nasal BID  . niMODipine  60 mg Oral Q4H   Or  . NiMODipine  60 mg Per Tube Q4H  . pantoprazole (PROTONIX) IV  40 mg Intravenous QHS  . senna-docusate  1 tablet Oral BID    Continuous Infusions: . sodium chloride 100 mL/hr at 02/22/14 0605  . niCARDipine Stopped (02/21/14 0600)    Past Medical History  Diagnosis Date  . Hypertension   . CVA (cerebral infarction)   . 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 Renal disorder   . Sepsis(995.91)   . Vascular insufficiency   . Stroke     Past Surgical History  Procedure Laterality Date  . Back surgery  2002/2009  .  Laparotomy  12/18/2010    Procedure: EXPLORATORY LAPAROTOMY;  Surgeon: Donato Heinz;  Location: [facility];  Service: General;  Laterality: N/A;  . Partial colectomy  12/18/2010    Procedure: PARTIAL COLECTOMY;  Surgeon: Donato Heinz;  Location: [facility];  Service: General;  Laterality: N/A;    Pryor Ochoa RD, LDN Inpatient Clinical Dietitian Pager: [phone] After Hours Pager: [phone]

## 2014-02-22 NOTE — Progress Notes (Signed)
Subjective: Patient reports Slight headache but otherwise awake alert  Objective: Vital signs in last 24 hours: Temp:  [97.4 F (36.3 C)-100.2 F (37.9 C)] 100.2 F (37.9 C) (01/02 0735) Pulse Rate:  [66-113] 96 (01/02 0800) Resp:  [14-22] 19 (01/02 0800) BP: (116-168)/(75-107) 147/104 mmHg (01/02 0800) SpO2:  [96 %-100 %] 99 % (01/02 0800)  Intake/Output from previous day: 01/01 0701 - 01/02 0700 In: 2600 [P.O.:200; I.V.:2400] Out: 3400 [Urine:3400] Intake/Output this shift: Total I/O In: 100 [I.V.:100] Out: -   Left hemiparesis status post subarachnoid hemorrhage and coiling  Lab Results:  Recent Labs  02/20/14 1533  WBC 14.4*  HGB 13.6  HCT 41.2  PLT 220   BMET  Recent Labs  02/20/14 1533  NA 132*  K 3.9  CL 97  CO2 29  GLUCOSE 99  BUN 22  CREATININE 1.15  CALCIUM 9.2    Studies/Results: Ct Head Wo Contrast  02/21/2014   CLINICAL DATA:  Altered mental status. 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  EXAM: CT HEAD WITHOUT CONTRAST  TECHNIQUE: Contiguous axial images were obtained from the base of the skull through the vertex without intravenous contrast.  COMPARISON:  CT of the head February 20, 3874  FINDINGS: Embolic material within RIGHT carotid terminus results in streak artifact, limiting evaluation. Increasing density within the RIGHT sylvian fissure, to lesser extent frontoparietal sulci. Small amount intravenous contrast.  Mild ventriculomegaly, unchanged from prior imaging, no intraventricular hemorrhage. Diffuse mild sulcal effacement, effaced basal cisterns. No acute large vascular territory infarct. No midline shift.  Atretic RIGHT maxillary sinus suggest sequelae of chronic sinusitis. No paranasal sinus air-fluid levels. Mild paranasal sinus mucosal thickening. Mastoid air cells are well-aerated. No skull fracture.  IMPRESSION: Increasing extra-axial density suggesting blood products and  contrast. Increasing basal cistern of effacement concerning for component of global parenchymal edema.  No acute large vascular territory infarct.   Electronically Signed   By: Elon Alas   On: 02/21/2014 01:31   Ct Head Wo Contrast  02/20/2014   CLINICAL DATA:  Initial evaluation for headache.  EXAM: CT HEAD WITHOUT CONTRAST  TECHNIQUE: Contiguous axial images were obtained from the base of the skull through the vertex without intravenous contrast.  COMPARISON:  Prior study from 10/31/2012.  FINDINGS: An ovoid hyperdense lesion measuring 11 x 10 mm present in the region of the right ICA terminus is concerning for aneurysm. A portion of this hyperdensity appears more fusiform in shape extending along the proximal right M1 segment. Scattered hyperdensity within the right frontotemporal region and within the right sylvian fissure concerning for acute subarachnoid hemorrhage, likely related to aneurysm rupture. No extra-axial fluid collection. No intraventricular hemorrhage identified.  No hydrocephalus.  No acute large vessel territory infarct.  No acute abnormality seen about the orbits.  Calvarium intact. Paranasal sinuses and mastoid air cells are clear.  IMPRESSION: 11 x 10 mm hyperdense lesion in the region of the right ICA terminus, concerning for aneurysm. Scattered hyperdensity within the right frontotemporal region and right sylvian fissure most consistent with acute subarachnoid hemorrhage, likely related to aneurysm rupture.  Critical Value/emergent results were called by telephone at the time of interpretation on 02/20/2014 at 3:19 pm to Dr. Elnora Morrison , who verbally acknowledged these results.   Electronically Signed   By: Jeannine Boga M.D.   On: 02/20/2014 15:37   Ct Angio Neck W/cm &/or Wo/cm  02/20/2014   CLINICAL DATA:  Initial evaluation for acute  head and neck pain.  EXAM: CT ANGIOGRAPHY NECK  TECHNIQUE: Multidetector CT imaging of the neck was performed using the standard  protocol during bolus administration of intravenous contrast. Multiplanar CT image reconstructions and MIPs were obtained to evaluate the vascular anatomy. Carotid stenosis measurements (when applicable) are obtained utilizing NASCET criteria, using the distal internal carotid diameter as the denominator.  CONTRAST:  167mL OMNIPAQUE IOHEXOL 350 MG/ML SOLN  COMPARISON:  None available.  FINDINGS: Aortic arch: Partially visualized aortic arch is somewhat ectatic without definite aneurysm. Origin of the great vessels not included on the study provided. Scattered calcified plaque present within the proximal left subclavian artery without significant stenosis. Subclavian arteries otherwise unremarkable.  Right carotid system: Proximal right common carotid artery mildly tortuous. Right common carotid artery widely patent without significant stenosis. Scattered calcified plaque present about the right carotid bifurcation and proximal right internal carotid artery without hemodynamically significant stenosis. Right ICA is mildly tortuous but widely patent to the level of the cavernous sinus. Right ICA terminus aneurysm seen on corresponding head CT in not included on this exam.  Right external carotid artery and its branches are within normal limits.  Left carotid system: Proximal left common carotid artery is mildly tortuous there is scattered calcified and noncalcified plaque about the proximal left common carotid artery without hemodynamically significant stenosis. Minimal plaque noted about the left carotid bifurcation and proximal left internal carotid artery without significant stenosis. Left ICA is mildly tortuous but well opacified and widely patent to the level of the cavernous sinus.  Left external carotid artery and its branches are within normal limits.  Vertebral arteries:Both vertebral arteries arise from the subclavian arteries. The left vertebral artery is slightly dominant. Vertebral arteries well opacified  to the level of the vertebrobasilar junction without significant stenosis, dissection, or occlusion.  No acute soft tissue abnormality identified within the neck. Thyroid gland is normal. No adenopathy. No mass lesion or loculated fluid collection.  Paraseptal and centrilobular emphysema noted within the bilateral lung apices, left greater than right.  Advanced multilevel degenerative disc disease noted throughout the cervical spine. No worrisome lytic or blastic osseous lesion. No acute osseous abnormality.  IMPRESSION: 1. No hemodynamically significant stenosis or acute abnormality identified within the major arterial vasculature of the neck. 2. 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   Electronically Signed   By: Jeannine Boga M.D.   On: 02/20/2014 15:50   Dg Abd Portable 1v  02/21/2014   CLINICAL DATA:  Nasogastric tube placement  EXAM: PORTABLE ABDOMEN - 1 VIEW  COMPARISON:  None.  FINDINGS: Feeding tube tip in the proximal stomach. The bowel gas pattern is nonobstructive. The lung bases are clear.  IMPRESSION: Feeding tube tip in the proximal stomach.   Electronically Signed   By: Jorje Guild M.D.   On: 02/21/2014 03:13   Ct Angio Chest Aorta W/cm &/or Wo/cm  02/20/2014   ADDENDUM REPORT: 02/20/2014 17:56  ADDENDUM: With regards to the area of abnormal enhancement in the posterior right hepatic dome, this may well represent a vascular malformation. MRI without and with contrast could be used to further evaluate and exclude underlying mass lesion.   Electronically Signed   By: Misty Stanley M.D.   On: 02/20/2014 17:56   02/20/2014   CLINICAL DATA:  Initial encounter for back and left hip pain that started yesterday.  EXAM: CT ANGIOGRAPHY CHEST, ABDOMEN AND PELVIS  TECHNIQUE: Multidetector CT imaging through the chest, abdomen and pelvis was performed  using the standard protocol during bolus administration of intravenous contrast. Multiplanar reconstructed  images and MIPs were obtained and reviewed to evaluate the vascular anatomy.  CONTRAST:  174mL OMNIPAQUE IOHEXOL 350 MG/ML SOLN  COMPARISON:  Abdomen and pelvis CT from 05/20/2011.  FINDINGS: CTA CHEST FINDINGS  Soft tissue / Mediastinum: There is no axillary, mediastinal, or hilar lymphadenopathy. Thoracic esophagus is unremarkable. Heart size is normal. No evidence of pericardial effusion.  Vasculature: Pre contrast imaging shows no hyperdense crescent in the wall of the thoracic aorta to suggest the presence of an acute intramural hematoma. There is no dissection flap within the lumen of the thoracic aorta. Branch vessel origins from the aortic arch are normal in patent. There is no thoracic aortic aneurysm.  Lungs / Pleura: Lung windows show paraseptal emphysema in the upper lobes, left greater than right. No pulmonary edema. No focal airspace consolidation. No pleural effusion. No worrisome parenchymal nodule or mass. There is some subsegmental atelectasis in the lingula and left lower lobe.  Bones: Bone windows reveal no worrisome lytic or sclerotic osseous lesions.  Review of the MIP images confirms the above findings.  CTA ABDOMEN AND PELVIS FINDINGS  Hepatobiliary: There is an area of abnormal enhancement in the posterior right hepatic dome without an underlying discrete mass lesion. Portal vein opacification in both the right and left liver is heterogeneous in this may be related to early phase contrast bolus. Multiple mucosal folds are seen in the gallbladder, but I think there are also associated gallstones. No intrahepatic or extrahepatic biliary dilation.  Pancreas: No focal mass lesion. No dilatation of the main duct. No intraparenchymal cyst. No peripancreatic edema.  Spleen: No splenomegaly. No focal mass lesion.  Adrenals/Urinary Tract: No adrenal nodule or mass. No hydronephrosis or mass lesion seen in either kidney on this arterial phase study. No evidence for hydroureter. Urinary bladder has  normal CT imaging features.  Stomach/Bowel: Stomach is nondistended. No gastric wall thickening. No evidence of outlet obstruction. Duodenum is normally positioned as is the ligament of Treitz. No small bowel wall thickening. No small bowel dilatation. Patient is status post right hemicolectomy. There is some diverticular change in the left colon without evidence of diverticulitis.  Vascular/Lymphatic: Atherosclerotic calcification is noted in the wall of the abdominal aorta without aneurysm. No evidence for dissection flap in the abdominal aorta. The celiac axis is widely patent. Superior mesenteric artery is widely patent. The main renal arteries are widely patent bilaterally inferior mesenteric artery is patent. Both common iliac arteries are ectatic, measuring 1.5 cm in diameter on the right and 1.2 cm in diameter on the left.  No lymphadenopathy in the abdomen. No pelvic sidewall lymphadenopathy.  Reproductive: Prostate gland and seminal vesicles are unremarkable.  Other: No intraperitoneal free fluid.  Musculoskeletal: Advanced degenerative changes are noted in the lumbar spine, stable.  Review of the MIP images confirms the above findings.  IMPRESSION: 1. No evidence for thoracoabdominal aortic aneurysm or dissection. 2. No acute findings in the chest, abdomen, or pelvis. 3. Probable cholelithiasis  Electronically Signed: By: Misty Stanley M.D. On: 02/20/2014 15:27   Ct Angio Abd/pel W/ And/or W/o  02/20/2014   ADDENDUM REPORT: 02/20/2014 17:56  ADDENDUM: With regards to the area of abnormal enhancement in the posterior right hepatic dome, this may well represent a vascular malformation. MRI without and with contrast could be used to further evaluate and exclude underlying mass lesion.   Electronically Signed   By: Misty Stanley M.D.   On:  02/20/2014 17:56   02/20/2014   CLINICAL DATA:  Initial encounter for back and left hip pain that started yesterday.  EXAM: CT ANGIOGRAPHY CHEST, ABDOMEN AND PELVIS   TECHNIQUE: Multidetector CT imaging through the chest, abdomen and pelvis was performed using the standard protocol during bolus administration of intravenous contrast. Multiplanar reconstructed images and MIPs were obtained and reviewed to evaluate the vascular anatomy.  CONTRAST:  139mL OMNIPAQUE IOHEXOL 350 MG/ML SOLN  COMPARISON:  Abdomen and pelvis CT from 05/20/2011.  FINDINGS: CTA CHEST FINDINGS  Soft tissue / Mediastinum: There is no axillary, mediastinal, or hilar lymphadenopathy. Thoracic esophagus is unremarkable. Heart size is normal. No evidence of pericardial effusion.  Vasculature: Pre contrast imaging shows no hyperdense crescent in the wall of the thoracic aorta to suggest the presence of an acute intramural hematoma. There is no dissection flap within the lumen of the thoracic aorta. Branch vessel origins from the aortic arch are normal in patent. There is no thoracic aortic aneurysm.  Lungs / Pleura: Lung windows show paraseptal emphysema in the upper lobes, left greater than right. No pulmonary edema. No focal airspace consolidation. No pleural effusion. No worrisome parenchymal nodule or mass. There is some subsegmental atelectasis in the lingula and left lower lobe.  Bones: Bone windows reveal no worrisome lytic or sclerotic osseous lesions.  Review of the MIP images confirms the above findings.  CTA ABDOMEN AND PELVIS FINDINGS  Hepatobiliary: There is an area of abnormal enhancement in the posterior right hepatic dome without an underlying discrete mass lesion. Portal vein opacification in both the right and left liver is heterogeneous in this may be related to early phase contrast bolus. Multiple mucosal folds are seen in the gallbladder, but I think there are also associated gallstones. No intrahepatic or extrahepatic biliary dilation.  Pancreas: No focal mass lesion. No dilatation of the main duct. No intraparenchymal cyst. No peripancreatic edema.  Spleen: No splenomegaly. No focal mass  lesion.  Adrenals/Urinary Tract: No adrenal nodule or mass. No hydronephrosis or mass lesion seen in either kidney on this arterial phase study. No evidence for hydroureter. Urinary bladder has normal CT imaging features.  Stomach/Bowel: Stomach is nondistended. No gastric wall thickening. No evidence of outlet obstruction. Duodenum is normally positioned as is the ligament of Treitz. No small bowel wall thickening. No small bowel dilatation. Patient is status post right hemicolectomy. There is some diverticular change in the left colon without evidence of diverticulitis.  Vascular/Lymphatic: Atherosclerotic calcification is noted in the wall of the abdominal aorta without aneurysm. No evidence for dissection flap in the abdominal aorta. The celiac axis is widely patent. Superior mesenteric artery is widely patent. The main renal arteries are widely patent bilaterally inferior mesenteric artery is patent. Both common iliac arteries are ectatic, measuring 1.5 cm in diameter on the right and 1.2 cm in diameter on the left.  No lymphadenopathy in the abdomen. No pelvic sidewall lymphadenopathy.  Reproductive: Prostate gland and seminal vesicles are unremarkable.  Other: No intraperitoneal free fluid.  Musculoskeletal: Advanced degenerative changes are noted in the lumbar spine, stable.  Review of the MIP images confirms the above findings.  IMPRESSION: 1. No evidence for thoracoabdominal aortic aneurysm or dissection. 2. No acute findings in the chest, abdomen, or pelvis. 3. Probable cholelithiasis  Electronically Signed: By: Misty Stanley M.D. On: 02/20/2014 15:27    Assessment/Plan: Post-coil day 2 from aneurysmal subarachnoid hemorrhage continue blood pressure monitoring and hydration  LOS: 2 days     Bridgid Printz P  02/22/2014, 8:54 AM

## 2014-02-23 DIAGNOSIS — I517 Cardiomegaly: Secondary | ICD-10-CM

## 2014-02-23 NOTE — Progress Notes (Signed)
Subjective: Patient reports mild headache  Objective: Vital signs in last 24 hours: Temp:  [98.1 F (36.7 C)-100.2 F (37.9 C)] 98.1 F (36.7 C) (01/03 0800) Pulse Rate:  [64-117] 74 (01/03 0800) Resp:  [15-28] 18 (01/03 0800) BP: (111-191)/(63-142) 166/86 mmHg (01/03 0800) SpO2:  [97 %-100 %] 97 % (01/03 0800)  Intake/Output from previous day: 01/02 0730 - 01/03 0729 In: 2400 [I.V.:2400] Out: 2950 [Urine:2950] Intake/Output this shift: Total I/O In: 100 [I.V.:100] Out: -   He is awake and alert and follows commands.  Lab Results: Lab Results  Component Value Date   WBC 14.4* 02/20/2014   HGB 13.6 02/20/2014   HCT 41.2 02/20/2014   MCV 87.8 02/20/2014   PLT 220 02/20/2014   Lab Results  Component Value Date   INR 1.46 02/20/2014   BMET Lab Results  Component Value Date   NA 132* 02/20/2014   K 3.9 02/20/2014   CL 97 02/20/2014   CO2 29 02/20/2014   GLUCOSE 99 02/20/2014   BUN 22 02/20/2014   CREATININE 1.15 02/20/2014   CALCIUM 9.2 02/20/2014    Studies/Results: No results found.  Assessment/Plan: Appears to be stable.   LOS: 3 days    Lysle Yero S 02/23/2014, 9:02 AM

## 2014-02-23 NOTE — Progress Notes (Signed)
Echocardiogram 2D Echocardiogram has been performed.  Brian [name] 02/23/2014, 11:17 AM

## 2014-02-24 ENCOUNTER — Encounter (HOSPITAL_COMMUNITY): Payer: Self-pay | Admitting: Neurosurgery

## 2014-02-24 NOTE — Progress Notes (Signed)
Pt seen and examined. No issues overnight. Pt denies HA.   EXAM: Temp:  [97.9 F (36.6 C)-100.6 F (38.1 C)] 98.8 F (37.1 C) (01/04 0800) Pulse Rate:  [64-103] 74 (01/04 0900) Resp:  [14-22] 19 (01/04 0900) BP: (99-163)/(40-98) 112/73 mmHg (01/04 0900) SpO2:  [94 %-98 %] 97 % (01/04 0900) Intake/Output      01/03 0701 - 01/04 0700 01/04 0701 - 01/05 0700   P.O. 1440    I.V. (mL/kg) 2400 (35.8) 100 (1.5)   Total Intake(mL/kg) 3840 (57.2) 100 (1.5)   Urine (mL/kg/hr) 2400 (1.5)    Total Output 2400     Net +1440 +100         Awake, alert Speech fluent CN grossly intact Left-sided neglect At least 4/5 strength LUE/LLE 5/5 RUE/RLE  LABS: Lab Results  Component Value Date   CREATININE 1.15 02/20/2014   BUN 22 02/20/2014   NA 132* 02/20/2014   K 3.9 02/20/2014   CL 97 02/20/2014   CO2 29 02/20/2014   Lab Results  Component Value Date   WBC 14.4* 02/20/2014   HGB 13.6 02/20/2014   HCT 41.2 02/20/2014   MCV 87.8 02/20/2014   PLT 220 02/20/2014    IMAGING: 2D Echo results reviewed. Significant concentric hypertrophy, normal EF, mild diastolic dysfunction.  IMPRESSION: - 69 y.o. male [place] d# 7 (pt presented 3-4 days after HA onset) s/p coiling of RICA aneurysm, neurologically stable with left-sided neglect  PLAN: - Cont to observe for spasm - Nimotop/TCD - Attempt d/c foley again today - Can try to mobilize with PT/OT

## 2014-02-24 NOTE — Progress Notes (Signed)
Speech Language Pathology Treatment: Dysphagia  Patient Details Name: Brian [name] MRN: [id number] DOB: [birth year] Today's Date: 02/24/2014 Time: 2122-4825 SLP Time Calculation (min) (ACUTE ONLY): 17 min  Assessment / Plan / Recommendation Clinical Impression  Dysphagia intervention to determine safety with current recommendations and diagnostic treatment with higher textures. Pt required min-mod prompting to verbalize strategy for left buccal cavity pocketing as well as removal of minimal amount in left corner of mouth. Consumed straw sips thin without difficulty. SLP educated and discussed upgrading to higher texture solids. Recommend upgrade to Dys 3 with pt diligent use of tongue sweep to check left side especially at end of meals. ST will continue to facilitate safe swallowing.   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 PMH of HTN, CVA, colitis, renal disorder, sepsis.    Pertinent Vitals Pain Assessment: No/denies pain  SLP Plan  Continue with current plan of care    Recommendations Diet recommendations: Dysphagia 3 (mechanical soft);Thin liquid Liquids provided via: Cup;Straw Medication Administration: Whole meds with puree Supervision: Patient able to self feed;Intermittent supervision to cue for compensatory strategies Compensations: Slow rate;Small sips/bites;Check for pocketing Postural Changes and/or Swallow Maneuvers: Seated upright 90 degrees              Oral Care Recommendations: Oral care BID Follow up Recommendations: Inpatient Rehab Plan: Continue with current plan of care    GO     Houston Siren 02/24/2014, 11:45 AM   [name] [phone]

## 2014-02-25 NOTE — Progress Notes (Signed)
UR completed.  Krist Rosenboom, RN BSN [facility] CCM Trauma/Neuro ICU Case Manager [phone]  

## 2014-02-25 NOTE — Evaluation (Signed)
Occupational Therapy Evaluation Patient Details Name: Brian [name] MRN: [id number] DOB: 1945-10-30 Today's Date: 02/25/2014    History of Present Illness pt presents post RICA Aneurysm coiling.     Clinical Impression   Pt admitted with RICA aneurysm . Pt currently with functional limitations due to the deficits listed below (see OT Problem List).  Pt will benefit from skilled OT to increase their safety and independence with ADL and functional mobility for ADL to facilitate discharge to venue listed below.      Follow Up Recommendations  CIR    Equipment Recommendations  Other (comment) (TBD)       Precautions / Restrictions Precautions Precautions: Fall Restrictions Weight Bearing Restrictions: No      Mobility Bed Mobility Overal bed mobility: Needs Assistance             General bed mobility comments: overall min a repositioning in bed  Transfers                 General transfer comment: NT         ADL   Eating/Feeding: Bed level;Minimal assistance Eating/Feeding Details (indicate cue type and reason): pt not using L UE.  Educated pt to try to use LUE as he can even though R handed                                   General ADL Comments: Bed eval only as pt eating lunch.  Pt did not want to sit back on EOB- but is willing to do more OOB activity next OT session     Vision   Alignment/Gaze Preference: Head turned;Gaze right Ocular Range of Motion: Within Functional Limits                      Pertinent Vitals/Pain Pain Assessment: No/denies pain     Hand Dominance     Extremity/Trunk Assessment Upper Extremity Assessment Upper Extremity Assessment: LUE deficits/detail LUE Deficits / Details: overall 2-/5 LUE Sensation: decreased proprioception LUE Coordination: decreased fine motor;decreased gross motor           Communication Communication Communication: No difficulties   Cognition Arousal/Alertness:  Awake/alert Behavior During Therapy: Restless Overall Cognitive Status: Impaired/Different from baseline Area of Impairment: Attention;Following commands;Safety/judgement;Awareness;Problem solving   Current Attention Level: Selective   Following Commands: Follows one step commands consistently;Follows multi-step commands with increased time Safety/Judgement: Decreased awareness of safety;Decreased awareness of deficits Awareness: Emergent Problem Solving: Difficulty sequencing;Requires verbal cues;Requires tactile cues General Comments: pt impulsive, but can be re-directed back to task.  pt can be tangential in conversation, but again can be re-directed.                Home Living Family/patient expects to be discharged to:: Inpatient rehab                                        Prior Functioning/Environment Level of Independence: Independent             OT Diagnosis: Generalized weakness;Cognitive deficits;Disturbance of vision;Hemiplegia non-dominant side   OT Problem List: Decreased strength;Decreased range of motion;Decreased activity tolerance;Impaired balance (sitting and/or standing);Impaired vision/perception;Decreased knowledge of use of DME or AE;Decreased safety awareness;Decreased cognition;Decreased coordination;Impaired UE functional use   OT Treatment/Interventions: Self-care/ADL training;Patient/family education;Neuromuscular education;Therapeutic exercise;Therapeutic activities  OT Goals(Current goals can be found in the care plan section) Acute Rehab OT Goals Patient Stated Goal: Go home and eat. OT Goal Formulation: With patient Time For Goal Achievement: 03/11/14 Potential to Achieve Goals: Good ADL Goals Pt Will Perform Eating: with set-up;sitting Pt Will Perform Grooming: with set-up;sitting Pt Will Perform Toileting - Clothing Manipulation and hygiene: with set-up;with min guard assist Pt/caregiver will Perform Home Exercise  Program: Left upper extremity;With Supervision;With written HEP provided;Increased strength;Increased ROM Additional ADL Goal #1: Pt will perform ADL activity on Left with eyes maintaining midline or left gaze with min VC in sitting positioni  OT Frequency: Min 2X/week           Co-evaluation    would be benefical          End of Session    Activity Tolerance: Patient tolerated treatment well Patient left: in bed;with family/visitor present;with call bell/phone within reach   Time: 1350-1410 OT Time Calculation (min): 20 min Charges:  OT General Charges $OT Visit: 1 Procedure OT Evaluation $Initial OT Evaluation Tier I: 1 Procedure OT Treatments $Self Care/Home Management : 8-22 mins G-Codes:    [name] 03-21-2014, 2:33 PM

## 2014-02-25 NOTE — Progress Notes (Signed)
Rehab Admissions Coordinator Note:  Patient was screened by Loraina Stauffer L for appropriateness for an Inpatient Acute Rehab Consult.  At this time, we are recommending Inpatient Rehab consult.  Catalia Massett L 02/25/2014, 11:02 AM  I can be reached at [phone].

## 2014-02-25 NOTE — Progress Notes (Signed)
Speech Language Pathology Treatment: Dysphagia  Patient Details Name: Brian [name] MRN: [id number] DOB: 09-06-1945 Today's Date: 02/25/2014 Time: 0822-0852 SLP Time Calculation (min) (ACUTE ONLY): 30 min  Assessment / Plan / Recommendation Clinical Impression  Dysphagia intervention during breakfast (upgraded Dys 3 yesterday) this morning requiring max-total tactile/visual/verbal cues entire session to decrease frequency of bites and check/remove max left sided pocketing. Pt impulsive with SLP provided tactile cues to remove fork from hand. Eventually cues decreased slightly to max as he responded to constant cueing pattern of "2 bites, chew, take sip liquid, chew, swallow", however, he requires full and constant supervision for decreased awareness.  Discussed session with RN and reviewed strategies. Downgraded diet back to Dys 2, continue thin. 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 PMH of HTN, CVA, colitis, renal disorder, sepsis.    Pertinent Vitals Pain Assessment: No/denies pain  SLP Plan  Continue with current plan of care    Recommendations Diet recommendations: Dysphagia 2 (fine chop);Thin liquid Liquids provided via: Cup;Straw Medication Administration: Whole meds with puree Supervision: Patient able to self feed;Full supervision/cueing for compensatory strategies Compensations: Slow rate;Small sips/bites;Check for pocketing Postural Changes and/or Swallow Maneuvers: Seated upright 90 degrees              Oral Care Recommendations: Oral care BID Follow up Recommendations:  (TBD) Plan: Continue with current plan of care    GO     Brian [name] 02/25/2014, 10:36 AM   Brian [name] Brian [name].Ed [employer] [phone]

## 2014-02-25 NOTE — Evaluation (Signed)
Physical Therapy Evaluation Patient Details Name: Brian [name] MRN: [id number] DOB: 1945-10-04 Today's Date: 02/25/2014   History of Present Illness  pt presents post RICA Aneurysm coiling.    Clinical Impression  Pt impulsive and with poor awareness of deficits.  Pt at this time needing A for all aspects of mobility and would benefit from CIR at D/C to maximize independence.      Follow Up Recommendations CIR    Equipment Recommendations  None recommended by PT    Recommendations for Other Services Rehab consult     Precautions / Restrictions Precautions Precautions: Fall Restrictions Weight Bearing Restrictions: No      Mobility  Bed Mobility Overal bed mobility: Needs Assistance Bed Mobility: Supine to Sit     Supine to sit: Min assist;HOB elevated     General bed mobility comments: pt utilizes bed rails and HOB elevated to begin wiggling himself to EOB.  needs MinA to complete task.    Transfers Overall transfer level: Needs assistance Equipment used: 2 person hand held assist Transfers: Sit to/from [facility] Sit to Stand: Mod assist;+2 physical assistance Stand pivot transfers: Mod assist;+2 physical assistance       General transfer comment: pt needs cues for safety and impulsivity.  pt needs increased A with L LE movement and maintaining balance.    Ambulation/Gait                Stairs            Wheelchair Mobility    Modified Rankin (Stroke Patients Only) Modified Rankin (Stroke Patients Only) Pre-Morbid Rankin Score: No symptoms Modified Rankin: Severe disability     Balance Overall balance assessment: Needs assistance Sitting-balance support: Bilateral upper extremity supported;Feet supported Sitting balance-Leahy Scale: Poor     Standing balance support: During functional activity Standing balance-Leahy Scale: Poor                               Pertinent Vitals/Pain Pain Assessment:  No/denies pain    Home Living Family/patient expects to be discharged to:: Inpatient rehab                      Prior Function Level of Independence: Independent               Hand Dominance        Extremity/Trunk Assessment   Upper Extremity Assessment: Defer to OT evaluation           Lower Extremity Assessment: LLE deficits/detail   LLE Deficits / Details: Generally weak 3+/5 with decreased coordination and proprioception.    Cervical / Trunk Assessment: Normal  Communication   Communication: No difficulties  Cognition Arousal/Alertness: Awake/alert Behavior During Therapy: Impulsive Overall Cognitive Status: Impaired/Different from baseline Area of Impairment: Attention;Following commands;Safety/judgement;Awareness;Problem solving   Current Attention Level: Selective   Following Commands: Follows one step commands consistently;Follows multi-step commands with increased time Safety/Judgement: Decreased awareness of safety;Decreased awareness of deficits Awareness: Emergent Problem Solving: Difficulty sequencing;Requires verbal cues;Requires tactile cues General Comments: pt impulsive, but can be re-directed back to task.  pt can be tangential in conversation, but again can be re-directed.      General Comments      Exercises        Assessment/Plan    PT Assessment Patient needs continued PT services  PT Diagnosis Difficulty walking   PT Problem List Decreased strength;Decreased activity tolerance;Decreased balance;Decreased  mobility;Decreased coordination;Decreased cognition;Decreased knowledge of use of DME;Decreased safety awareness;Impaired sensation  PT Treatment Interventions DME instruction;Gait training;Functional mobility training;Therapeutic activities;Therapeutic exercise;Balance training;Neuromuscular re-education;Cognitive remediation;Patient/family education   PT Goals (Current goals can be found in the Care Plan section) Acute  Rehab PT Goals Patient Stated Goal: Go home and eat. PT Goal Formulation: With patient Time For Goal Achievement: 03/11/14 Potential to Achieve Goals: Good    Frequency Min 3X/week   Barriers to discharge        Co-evaluation               End of Session Equipment Utilized During Treatment: Gait belt Activity Tolerance: Patient tolerated treatment well Patient left: in chair;with call bell/phone within reach;with chair alarm set Nurse Communication: Mobility status         Time: 5329-9242 PT Time Calculation (min) (ACUTE ONLY): 27 min   Charges:   PT Evaluation $Initial PT Evaluation Tier I: 1 Procedure PT Treatments $Therapeutic Activity: 8-22 mins   PT G CodesCatarina Hartshorn, [place] [phone] 02/25/2014, 10:54 AM

## 2014-02-25 NOTE — Progress Notes (Signed)
Pt seen and examined. No issues overnight. Pt reports no HA. No complaints this am. He is sitting in bedside chair.  EXAM: Temp:  [98.2 F (36.8 C)-98.7 F (37.1 C)] 98.4 F (36.9 C) (01/05 0000) Pulse Rate:  [69-104] 104 (01/05 0900) Resp:  [14-23] 17 (01/05 0900) BP: (119-172)/(78-111) 134/85 mmHg (01/05 0900) SpO2:  [96 %-100 %] 98 % (01/05 0900) Intake/Output      01/04 0701 - 01/05 0700 01/05 0701 - 01/06 0700   P.O.     I.V. (mL/kg) 2300 (34.3) 300 (4.5)   Other 1200    Total Intake(mL/kg) 3500 (52.1) 300 (4.5)   Urine (mL/kg/hr) 1350 (0.8) 100 (0.6)   Total Output 1350 100   Net +2150 +200         Awake, alert, oriented to person, place, year, reason for admission Speech fluent, appopriate CN grossly intact Mild L neglect appears improved from yesterday At least 4/5 LUE/LLE 5/5 RUE/RLE  LABS: Lab Results  Component Value Date   CREATININE 1.15 02/20/2014   BUN 22 02/20/2014   NA 132* 02/20/2014   K 3.9 02/20/2014   CL 97 02/20/2014   CO2 29 02/20/2014   Lab Results  Component Value Date   WBC 14.4* 02/20/2014   HGB 13.6 02/20/2014   HCT 41.2 02/20/2014   MCV 87.8 02/20/2014   PLT 220 02/20/2014    IMPRESSION: - 69 y.o. male SAH d# 8 s/p RICA coiling, neurologically stable to improved  PLAN: - Cont current mgmt - Mobilization with PT/OT

## 2014-02-26 DIAGNOSIS — I608 Other nontraumatic subarachnoid hemorrhage: Secondary | ICD-10-CM

## 2014-02-26 DIAGNOSIS — G8190 Hemiplegia, unspecified affecting unspecified side: Secondary | ICD-10-CM

## 2014-02-26 NOTE — Progress Notes (Signed)
Pt seen and examined. No issues overnight. Pt denies HA. No complaints of new weakness/numbness  EXAM: Temp:  [98.3 F (36.8 C)-98.8 F (37.1 C)] 98.5 F (36.9 C) (01/06 0758) Pulse Rate:  [74-109] 102 (01/06 0900) Resp:  [17-25] 21 (01/06 0900) BP: (118-157)/(81-103) 118/85 mmHg (01/06 0900) SpO2:  [95 %-100 %] 96 % (01/06 0900) Intake/Output      01/05 0701 - 01/06 0700 01/06 0701 - 01/07 0700   I.V. (mL/kg) 2400 (35.8) 200 (3)   Other     Total Intake(mL/kg) 2400 (35.8) 200 (3)   Urine (mL/kg/hr) 2175 (1.3)    Total Output 2175     Net +225 +200        Urine Occurrence 1 x     Awake, alert, oriented Speech fluent CN grossly intact >4/5 on Right 5/5 Left  LABS: Lab Results  Component Value Date   CREATININE 1.15 02/20/2014   BUN 22 02/20/2014   NA 132* 02/20/2014   K 3.9 02/20/2014   CL 97 02/20/2014   CO2 29 02/20/2014   Lab Results  Component Value Date   WBC 14.4* 02/20/2014   HGB 13.6 02/20/2014   HCT 41.2 02/20/2014   MCV 87.8 02/20/2014   PLT 220 02/20/2014    IMPRESSION: - 69 y.o. male SAH d# 9 s/p RICA coiling, neurologically stable  PLAN: - Cont spasm observation, prophylaxis with Nimotop - Consult PMR for CIR upon d/c

## 2014-02-26 NOTE — Progress Notes (Signed)
Speech Language Pathology Treatment: Dysphagia  Patient Details Name: SHERREL SHAFER MRN: [id number] DOB: [birth year] Today's Date: 02/26/2014 Time: 0850-     Assessment / Plan / Recommendation Clinical Impression  Dysphagia intervention during breakfast meal. Less impulsive today and able to state swallow strategies with 90% accuracy. Needed only moderate cues to check and remove left sided pocketing. Pt mixed eggs and sausage in girts resulting in decreased pocketing and mastication needed. No indications of aspiration. Progressing well with swallow and continues to need full supervision.    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 PMH of HTN, CVA, colitis, renal disorder, sepsis.    Pertinent Vitals Pain Assessment: No/denies pain  SLP Plan  Continue with current plan of care    Recommendations Diet recommendations: Dysphagia 2 (fine chop);Thin liquid Liquids provided via: Cup;Straw Medication Administration: Whole meds with puree Supervision: Patient able to self feed;Full supervision/cueing for compensatory strategies Compensations: Slow rate;Small sips/bites;Check for pocketing Postural Changes and/or Swallow Maneuvers: Seated upright 90 degrees              Oral Care Recommendations: Oral care BID Follow up Recommendations:  (TBD) Plan: Continue with current plan of care    GO     Houston Siren 02/26/2014, 9:06 AM   [name] [phone]

## 2014-02-26 NOTE — Progress Notes (Signed)
Physical Therapy Treatment Patient Details Name: Brian [name] MRN: [id number] DOB: [birth year] Today's Date: 02/26/2014    History of Present Illness pt presents post RICA Aneurysm coiling.      PT Comments    Pt continues to be impulsive and needing cues for safety and attending to task.  Pt continues to need 2 person A for OOB activity.  Still feel pt would benefit from CIR at D/C.    Follow Up Recommendations  CIR     Equipment Recommendations  None recommended by PT    Recommendations for Other Services       Precautions / Restrictions Precautions Precautions: Fall Restrictions Weight Bearing Restrictions: No    Mobility  Bed Mobility Overal bed mobility: Needs Assistance Bed Mobility: Supine to Sit     Supine to sit: Min assist;HOB elevated     General bed mobility comments: A with bringing trunk up to sitting and scooting hips to EOB.  pt utilizes bed rails and HOB elevated.    Transfers Overall transfer level: Needs assistance Equipment used: 2 person hand held assist Transfers: Sit to/from [facility] Sit to Stand: Mod assist;+2 physical assistance Stand pivot transfers: Mod assist;+2 physical assistance       General transfer comment: cues for safe technique and attending to task.  pt with difficulty with upright posture today.    Ambulation/Gait                 Stairs            Wheelchair Mobility    Modified Rankin (Stroke Patients Only)       Balance Overall balance assessment: Needs assistance Sitting-balance support: Bilateral upper extremity supported;Feet supported Sitting balance-Leahy Scale: Poor     Standing balance support: During functional activity Standing balance-Leahy Scale: Poor                      Cognition Arousal/Alertness: Awake/alert Behavior During Therapy: Impulsive Overall Cognitive Status: Impaired/Different from baseline Area of Impairment: Attention;Following  commands;Safety/judgement;Awareness;Problem solving   Current Attention Level: Selective   Following Commands: Follows one step commands consistently;Follows multi-step commands with increased time Safety/Judgement: Decreased awareness of safety;Decreased awareness of deficits Awareness: Emergent Problem Solving: Difficulty sequencing;Requires verbal cues;Requires tactile cues General Comments: pt continues to be impulsive, but does well with re-direction to task and safety cues.      Exercises      General Comments        Pertinent Vitals/Pain Pain Assessment: No/denies pain    Home Living                      Prior Function            PT Goals (current goals can now be found in the care plan section) Acute Rehab PT Goals Patient Stated Goal: Go home and eat. PT Goal Formulation: With patient Time For Goal Achievement: 03/11/14 Potential to Achieve Goals: Good Progress towards PT goals: Progressing toward goals    Frequency  Min 3X/week    PT Plan Current plan remains appropriate    Co-evaluation             End of Session Equipment Utilized During Treatment: Gait belt Activity Tolerance: Patient tolerated treatment well Patient left: in chair;with call bell/phone within reach;with chair alarm set     Time: 7824-2353 PT Time Calculation (min) (ACUTE ONLY): 19 min  Charges:  $Therapeutic Activity: 8-22 mins  G CodesCatarina Hartshorn, [place] 02/26/2014, 1:23 PM

## 2014-02-26 NOTE — Progress Notes (Signed)
I spoke with pt's daughter, Denman George, by phone. Pt lives in [place], and his son, Jack Quarto, lives with him. Pt has a PCS aide 2 to 3 hrs per day with his Medicaid. Pt used his cane in the home and a power chair in the community for he can not walk long distances. He cooks and does his own adls. Pt was at [facility] for a few months in 2013 due to a prolonged hospitalization. I will contact pt's son, Jack Quarto and further discuss with pt and family options for his rehab recovery. [phone]
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
## 2014-02-27 MED ORDER — ENSURE COMPLETE PO LIQD
237.0000 mL | Freq: Two times a day (BID) | ORAL | Status: DC
  Administered 2014-02-27 – 2014-03-02 (×8): 237 mL via ORAL

## 2014-02-27 NOTE — Progress Notes (Signed)
NUTRITION FOLLOW-UP   INTERVENTION: Provide Ensure Complete po BID, each supplement provides 350 kcal and 13 grams of protein  NUTRITION DIAGNOSIS: Predicted sub optimal energy intake related to recent [place] as evidenced by 50% meal completion; resolved.   Goal: Pt to meet >/= 90% of their estimated nutrition needs; met.   Monitor:  PO intake, weight trend, labs   ASSESSMENT: 69 y.o. male transferred from [facility] after presenting with HA and neck pain. 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   Pt reports no recent weight changes and a good appetite. Pt eating 100% of his meals and states he would eat more. Pt likes ensure and would like to have them ordered, specifically requested one at night.   Nutrition Focused Physical Exam:  Subcutaneous Fat:  Orbital Region: WDL Upper Arm Region: WDL Thoracic and Lumbar Region: WDL  Muscle:  Temple Region: severe depletion, pt does not feel this is different from his normal Clavicle Bone Region: WDL Clavicle and Acromion Bone Region: WDL Scapular Bone Region: WDL Dorsal Hand: WDL  Patellar Region: WDL Anterior Thigh Region: WDL Posterior Calf Region: WDL  Edema: not present   Height: Ht Readings from Last 1 Encounters:  02/20/14 5' 7.5" (1.715 m)    Weight: Wt Readings from Last 1 Encounters:  02/20/14 148 lb (67.132 kg)    BMI:  Body mass index is 22.82 kg/(m^2).  Estimated Nutritional Needs: Kcal: 1650-1850 Protein: 80-90 grams Fluid: 1.7-1.9 L/day  Skin: closed surgical wound on right groin  Diet Order: DIET DYS 2, thin liquids Meal Completion: 100%   Intake/Output Summary (Last 24 hours) at 02/27/14 0947 Last data filed at 02/27/14 0800  Gross per 24 hour  Intake   2100 ml  Output   3875 ml  Net  -1775 ml    Last BM: 1/7  Labs:   Recent Labs Lab 02/20/14 1533  NA 132*  K 3.9  CL 97  CO2 29  BUN 22  CREATININE 1.15  CALCIUM 9.2  GLUCOSE 99    CBG (last 3)  No results for  input(s): GLUCAP in the last 72 hours.  Scheduled Meds: . feeding supplement (ENSURE COMPLETE)  237 mL Oral BID BM  . niMODipine  60 mg Oral Q4H   Or  . NiMODipine  60 mg Per Tube Q4H  . pantoprazole sodium  40 mg Per Tube QHS  . senna-docusate  1 tablet Oral BID    Continuous Infusions: . sodium chloride 100 mL/hr at 02/27/14 0300  . niCARDipine Stopped (02/21/14 0600)   Maylon Peppers RD, [place], [place] Pager [phone] After Hours Pager

## 2014-02-27 NOTE — Progress Notes (Signed)
Pt seen and examined. No issues overnight. Pt doing well, no complaints. Sitting in bedside chair.  EXAM: Temp:  [98.1 F (36.7 C)-99.7 F (37.6 C)] 98.1 F (36.7 C) (01/07 0824) Pulse Rate:  [64-96] 79 (01/07 0800) Resp:  [13-26] 20 (01/07 0800) BP: (113-169)/(75-111) 125/87 mmHg (01/07 0800) SpO2:  [94 %-100 %] 98 % (01/07 0800) Intake/Output      01/06 0701 - 01/07 0700 01/07 0701 - 01/08 0700   I.V. (mL/kg) 2200 (32.8) 100 (1.5)   Total Intake(mL/kg) 2200 (32.8) 100 (1.5)   Urine (mL/kg/hr) 3875 (2.4)    Total Output 3875     Net -1675 +100        Stool Occurrence 2 x     Awake, alert, oriented Speech fluent CN intact Good strength throughout, No appreciable neglect on left  LABS: Lab Results  Component Value Date   CREATININE 1.15 02/20/2014   BUN 22 02/20/2014   NA 132* 02/20/2014   K 3.9 02/20/2014   CL 97 02/20/2014   CO2 29 02/20/2014   Lab Results  Component Value Date   WBC 14.4* 02/20/2014   HGB 13.6 02/20/2014   HCT 41.2 02/20/2014   MCV 87.8 02/20/2014   PLT 220 02/20/2014    IMPRESSION: - 69 y.o. male SAH d# 10 s/p RICA coiling, neurologically stable  PLAN: - Cont observation in ICU - Mobilize with PT/OT - If stable, can be transferred on Mon

## 2014-02-27 NOTE — H&P (Signed)
Physical Medicine and Rehabilitation Admission H&P    Chief Complaint  Patient presents with  . Back Pain  : Brian [name] is a 69 y.o. right handed male with history of tobacco abuse, hypertension,? CVA 2 as well as hypercoagulable state maintained on chronic Coumadin. As reported patient had recently ran out of his medication for the past week. He lives with his son used a walker prior to admission and has a home health aide 2-3 hours per day. Presented to [facility] 02/19/2014 with headache and neck pain. Blood pressure 161/113. INR upon admission of 1.48. 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 Patient was transferred to [facility] for ongoing evaluation. 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 Kathyrn Sheriff 02/20/2014. Patient remains Nimotop monitoring for any vasospasms. MRSA PCR screening positive maintained on contact precautions. He is on a dysphagia 2 thin liquid diet. Reported to be impulsive with poor safety awareness. Physical and occupational therapy evaluations completed 02/25/2014 with recommendations of physical medicine rehabilitation consult. Patient was admitted for comprehensive rehabilitation program   ROS Review of Systems  Gastrointestinal: Positive for nausea.  Musculoskeletal: Positive for myalgias and back pain.  Neurological: Positive for weakness.  All other systems reviewed and are negative    Past Medical History  Diagnosis Date  . Hypertension   . CVA (cerebral infarction)   . 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 Renal disorder   . Sepsis(995.91)   . Vascular insufficiency   . Stroke    Past Surgical History  Procedure Laterality Date  . Back surgery  2002/2009  . Laparotomy  12/18/2010    Procedure: EXPLORATORY LAPAROTOMY;  Surgeon: Donato Heinz;  Location: [facility];  Service: General;   Laterality: N/A;  . Partial colectomy  12/18/2010    Procedure: PARTIAL COLECTOMY;  Surgeon: Donato Heinz;  Location: [facility];  Service: General;  Laterality: N/A;  . Radiology with anesthesia N/A 02/20/2014    Procedure: RADIOLOGY WITH ANESTHESIA;  Surgeon: [name], MD;  Location: [place];  Service: Radiology;  Laterality: N/A;   Family History  Problem Relation Age of Onset  . Colon cancer Neg Hx   . Liver disease Neg Hx    Social History:  reports that he has been smoking Cigarettes.  He has a 10 pack-year smoking history. He has never used smokeless tobacco. He reports that he drinks alcohol. He reports that he uses illicit drugs (Marijuana). Allergies: No Known Allergies Medications Prior to Admission  Medication Sig Dispense Refill  . acetaminophen (TYLENOL) 325 MG tablet Take 650 mg by mouth every 4 (four) hours as needed. For temp > 101.    . albuterol (PROVENTIL) (5 MG/ML) 0.5% nebulizer solution Take 0.5 mLs (2.5 mg total) by nebulization every 4 (four) hours as needed for wheezing. 20 mL   . amLODipine (NORVASC) 10 MG tablet Take 10 mg by mouth daily.    Marland Kitchen doxepin (SINEQUAN) 25 MG capsule Take 50 mg by mouth at bedtime.    Marland Kitchen HYDROcodone-acetaminophen (NORCO) 10-325 MG per tablet Take 1 tablet by mouth every 6 (six) hours as needed for moderate pain or severe pain.    Marland Kitchen tiZANidine (ZANAFLEX) 2 MG tablet Take 2 mg by mouth every 8 (eight) hours as needed for muscle spasms.    . traMADol (ULTRAM) 50 MG tablet Take 50-100 mg by  mouth every 6 (six) hours as needed for moderate pain.    Marland Kitchen triamterene-hydrochlorothiazide (DYAZIDE) 37.5-25 MG per capsule Take 1 capsule by mouth daily.    . diphenoxylate-atropine (LOMOTIL) 2.5-0.025 MG per tablet Take 1 tablet by mouth 4 (four) times daily as needed for diarrhea or loose stools. 15 tablet 0  . feeding supplement (ENSURE COMPLETE) LIQD Take 237 mLs by mouth 3 (three) times daily between meals. (Patient not taking: Reported on  02/20/2014)    . fentaNYL (DURAGESIC - DOSED MCG/HR) 25 MCG/HR Place 1 patch (25 mcg total) onto the skin every 3 (three) days. (Patient not taking: Reported on 02/20/2014) 5 patch 0  . ondansetron (ZOFRAN) 4 MG tablet Take 1 tablet (4 mg total) by mouth every 6 (six) hours. (Patient not taking: Reported on 02/20/2014) 12 tablet 0  . warfarin (COUMADIN) 2.5 MG tablet Take 2 tablets (5 mg total) by mouth every evening. May need higher dose after he finishes the course of flagyl (Patient not taking: Reported on 02/20/2014)      Home: Home Living Family/patient expects to be discharged to:: Inpatient rehab Living Arrangements: Children   Functional History: Prior Function Level of Independence: Independent  Functional Status:  Mobility: Bed Mobility Overal bed mobility: Needs Assistance Bed Mobility: Supine to Sit Supine to sit: Min assist, HOB elevated General bed mobility comments: A with bringing trunk up to sitting and scooting hips to EOB.  pt utilizes bed rails and HOB elevated.   Transfers Overall transfer level: Needs assistance Equipment used: 2 person hand held assist Transfers: Sit to/from Stand, Stand Pivot Transfers Sit to Stand: Mod assist, +2 physical assistance Stand pivot transfers: Mod assist, +2 physical assistance General transfer comment: cues for safe technique and attending to task.  pt with difficulty with upright posture today.        ADL: ADL Eating/Feeding: Bed level, Minimal assistance Eating/Feeding Details (indicate cue type and reason): pt not using L UE.  Educated pt to try to use LUE as he can even though R handed General ADL Comments: Bed eval only as pt eating lunch.  Pt did not want to sit back on EOB- but is willing to do more OOB activity next OT session  Cognition: Cognition Overall Cognitive Status: Impaired/Different from baseline Orientation Level: Oriented X4 Cognition Arousal/Alertness: Awake/alert Behavior During Therapy:  Impulsive Overall Cognitive Status: Impaired/Different from baseline Area of Impairment: Attention, Following commands, Safety/judgement, Awareness, Problem solving Current Attention Level: Selective Following Commands: Follows one step commands consistently, Follows multi-step commands with increased time Safety/Judgement: Decreased awareness of safety, Decreased awareness of deficits Awareness: Emergent Problem Solving: Difficulty sequencing, Requires verbal cues, Requires tactile cues General Comments: pt continues to be impulsive, but does well with re-direction to task and safety cues.    Physical Exam: Blood pressure 130/81, pulse 71, temperature 98.3 F (36.8 C), temperature source Oral, resp. rate 16, height 5' 7.5" (1.715 m), weight 67.132 kg (148 lb), SpO2 96 %. Physical Exam HENT:  Poor dentition. Right gaze preference. Thrush over tongue Eyes:  Pupils reactive to light  Neck: Normal range of motion. Neck supple. No thyromegaly present.  Cardiovascular: Normal rate and regular rhythm.  Respiratory: Effort normal and breath sounds normal. No respiratory distress.  GI: Soft. Bowel sounds are normal. He exhibits no distension.  Neurological: He is alert.   Motor strength is 5/5 in the right deltoid, biceps, triceps, grip, hip flexor, knee extensor, ankle dorsi flexion 5 plantar Motor strength is 4/5 in the left deltoid,  biceps, triceps, grip. 3/5 hip flexor, knee extensor,3-/5 ankle dorsi flexion Patient reports equal sensation to light touch bilateral upper and lower limbs but describes a tingling he experiences at night. There is no evidence of left facial droop or tongue deviation Visual fields appear intact. Tracks to all fields Skin: generally intact Psych: has fair insight and awareness. Mood pleasant and he's cooperative  No results found for this or any previous visit (from the past 58 hour(s)). No results found.     Medical Problem List and Plan: 1. Functional  deficits secondary to right ICA aneurysm rupture status post coiling with right subarachnoid hemorrhage 2.  DVT Prophylaxis/Anticoagulation: SCDs. Monitor for any signs of DVT 3. Pain Management: Tylenol as needed 4. History of CVA 2 with hypercoagulable state. Coumadin on hold due to subarachnoid hemorrhage. 5. Neuropsych: This patient is capable of making decisions on his own behalf. 6. Skin/Wound Care: Routine skin checks 7. Fluids/Electrolytes/Nutrition: Strict I and O follow-up chemistries 8. Hypertension. Nimotop protocol to end  03/13/2014. Monitor with increased mobility 9. Dysphagia. Dysphagia 2 thin liquid diet. Monitor for any aspiration. Follow-up speech therapy 10. MRSA PCR screening positive. Contact precautions 11. Thrus: nystatin swish and swallow     Post Admission Physician Evaluation: 1. Functional deficits secondary  to right ICA aneuyrsm rupture with resulting subarachnoid hemorrhage. 2. Patient is admitted to receive collaborative, interdisciplinary care between the physiatrist, rehab nursing staff, and therapy team. 3. Patient's level of medical complexity and substantial therapy needs in context of that medical necessity cannot be provided at a lesser intensity of care such as a SNF. 4. Patient has experienced substantial functional loss from his/her baseline which was documented above under the "Functional History" and "Functional Status" headings.  Judging by the patient's diagnosis, physical exam, and functional history, the patient has potential for functional progress which will result in measurable gains while on inpatient rehab.  These gains will be of substantial and practical use upon discharge  in facilitating mobility and self-care at the household level. 5. Physiatrist will provide 24 hour management of medical needs as well as oversight of the therapy plan/treatment and provide guidance as appropriate regarding the interaction of the two. 6. 24 hour rehab  nursing will assist with bladder management, bowel management, safety, skin/wound care, disease management, medication administration, pain management and patient education  and help integrate therapy concepts, techniques,education, etc. 7. PT will assess and treat for/with: Lower extremity strength, range of motion, stamina, balance, functional mobility, safety, adaptive techniques and equipment, NMR, cognitive perceptual awareness.   Goals are: supervision. 8. OT will assess and treat for/with: ADL's, functional mobility, safety, upper extremity strength, adaptive techniques and equipment, NMR, cognitive perceptual awareness, pain mgt, visual perceptual awareness.   Goals are: supervision. Therapy may proceed with showering this patient. 9. SLP will assess and treat for/with: cognition, communication.  Goals are: supervision to mod I1. 10. Case Management and Social Worker will assess and treat for psychological issues and discharge planning. 11. Team conference will be held weekly to assess progress toward goals and to determine barriers to discharge. 12. Patient will receive at least 3 hours of therapy per day at least 5 days per week. 13. ELOS: 10-15 days       14. Prognosis:  excellent     Meredith Staggers, MD, [place] [facility] 03/03/2014   02/27/2014

## 2014-02-27 NOTE — Progress Notes (Signed)
Occupational Therapy Treatment Patient Details Name: Brian [name] MRN: [id number] DOB: Nov 17, 1945 Today's Date: 02/27/2014    History of present illness pt presents post RICA Aneurysm coiling. Pt with hx of CVA and residual L side weakness, but unclear to what degree.   OT comments  Pt using L UE as a functional stabilizer for grooming. Pt unable to describe the functionality of his L UE prior to this admission, but did state he would not trust it to carry a cup of coffee.  Needs verbal cues for sequencing and to remain on task.  Continues to have R gaze preference, worked on locating ADL items in L visual space.  Attempted further vision testing, but pt with difficulty complying/attending to formal assessment.  Pt repeating that he would be fine if he could just back in his kitchen and start cooking. Pt will be a good rehab candidate.  Follow Up Recommendations  CIR    Equipment Recommendations       Recommendations for Other Services      Precautions / Restrictions Precautions Precautions: Fall Restrictions Weight Bearing Restrictions: No       Mobility Bed Mobility                  Transfers                      Balance                                   ADL Overall ADL's : Needs assistance/impaired Eating/Feeding: Supervision/ safety;Bed level Eating/Feeding Details (indicate cue type and reason): Pt compliant with sipping mild as instructed by ST. Grooming: Oral care;Wash/dry hands;Wash/dry face;Bed level;Minimal assistance Grooming Details (indicate cue type and reason): Pt spontaneously used L UE to stabilize toothpaste box and tube while R opened.  Grasp of toothbrush with L hand while placing toothpaste was ineffective.                               General ADL Comments: Pt required moderate verbal cues to sequence setting up for toothbrushing, fair thoroughness with oral care and face and hand washing.      Vision  Eye Alignment: Impaired (comment) Alignment/Gaze Preference: Head turned;Gaze right Ocular Range of Motion: Restricted on the left Tracking/Visual Pursuits: Left eye does not track laterally;Unable to hold eye position out of midline;Decreased smoothness of eye movement to RIGHT superior field;Decreased smoothness of eye movement to LEFT superior field;Decreased smoothness of eye movement to RIGHT inferior field;Decreased smoothness of eye movement to LEFT inferior field Saccades: Additional eye shifts occurred during testing;Additional head turns occurred during testing;Decreased speed of saccadic movement       Additional Comments: Able to read large print with min assist. Placed ADL items in L hemispace to encourage scanning. Pt's poor attention interferes with ability to comply with requirements of accurate vision testing.   Perception     Praxis      Cognition   Behavior During Therapy: Impulsive Overall Cognitive Status: Impaired/Different from baseline Area of Impairment: Attention;Following commands;Safety/judgement;Awareness;Problem solving   Current Attention Level: Selective    Following Commands: Follows one step commands consistently;Follows multi-step commands with increased time Safety/Judgement: Decreased awareness of safety;Decreased awareness of deficits Awareness: Emergent Problem Solving: Difficulty sequencing;Requires verbal cues;Requires tactile cues      Extremity/Trunk Assessment  Exercises General Exercises - Upper Extremity Shoulder Flexion: AAROM;Left;10 reps;Seated Shoulder ABduction: AAROM;Left;10 reps;Seated Shoulder Horizontal ABduction: AAROM;Left;10 reps;Seated Shoulder Horizontal ADduction: AAROM;Left;10 reps;Seated Elbow Flexion: AROM;Left;10 reps;Seated Elbow Extension: AAROM;Left;10 reps;Seated Digit Composite Flexion: AROM;Left;10 reps;Seated Composite Extension: AROM;Left;10 reps;Seated   Shoulder Instructions        General Comments      Pertinent Vitals/ Pain       Pain Assessment: No/denies pain  Home Living                                          Prior Functioning/Environment              Frequency Min 2X/week     Progress Toward Goals  OT Goals(current goals can now be found in the care plan section)  Progress towards OT goals: Progressing toward goals     Plan Discharge plan remains appropriate    Co-evaluation                 End of Session     Activity Tolerance Patient tolerated treatment well   Patient Left in bed;with call bell/phone within reach   Nurse Communication Other (comment) (ok to give milk)        Time: 7619-5093 OT Time Calculation (min): 37 min  Charges: OT General Charges $OT Visit: 1 Procedure OT Treatments $Self Care/Home Management : 8-22 mins $Neuromuscular Re-education: 8-22 mins  Malka So 02/27/2014, 12:50 PM [phone]

## 2014-02-27 NOTE — Progress Notes (Signed)
Physical Therapy Treatment Patient Details Name: Brian [name] MRN: [id number] DOB: December 27, 1945 Today's Date: 02/27/2014    History of Present Illness pt presents post RICA Aneurysm coiling. Pt with hx of CVA and residual L side weakness, but unclear to what degree.    PT Comments    Pt able to initiate ambulation today with 2 person A.  Pt continues to be impulsive, but is beginning to demo safer technique with mobility.  Still feel pt would benefit from CIR to maximize independence.  Will continue to follow.    Follow Up Recommendations  CIR     Equipment Recommendations  None recommended by PT    Recommendations for Other Services       Precautions / Restrictions Precautions Precautions: Fall Restrictions Weight Bearing Restrictions: No    Mobility  Bed Mobility Overal bed mobility: Needs Assistance Bed Mobility: Supine to Sit     Supine to sit: Min assist;HOB elevated     General bed mobility comments: pt needs increased time, but continues to need A bringing trunk up to sitting.    Transfers Overall transfer level: Needs assistance Equipment used: 2 person hand held assist Transfers: Sit to/from Stand Sit to Stand: Mod assist;+2 physical assistance         General transfer comment: pt demos good use of UEs today, but needs A positioning LEs prior to coming to stand and controlling descent to sitting.    Ambulation/Gait Ambulation/Gait assistance: Mod assist;+2 physical assistance;+2 safety/equipment Ambulation Distance (Feet): 8 Feet Assistive device: 2 person hand held assist Gait Pattern/deviations: Step-through pattern;Decreased stride length;Narrow base of support;Scissoring;Trunk flexed     General Gait Details: pt needs 2 person A for balance and close chair follow.  pt has difficulty maintaining a wider BOS 2/2 increased tone and ataxic type gait.     Stairs            Wheelchair Mobility    Modified Rankin (Stroke Patients Only)        Balance Overall balance assessment: Needs assistance Sitting-balance support: Bilateral upper extremity supported;Feet supported Sitting balance-Leahy Scale: Poor     Standing balance support: During functional activity Standing balance-Leahy Scale: Poor                      Cognition Arousal/Alertness: Awake/alert Behavior During Therapy: Impulsive Overall Cognitive Status: Impaired/Different from baseline Area of Impairment: Attention;Following commands;Safety/judgement;Awareness;Problem solving   Current Attention Level: Selective   Following Commands: Follows one step commands consistently;Follows multi-step commands with increased time Safety/Judgement: Decreased awareness of safety;Decreased awareness of deficits Awareness: Emergent Problem Solving: Difficulty sequencing;Requires verbal cues;Requires tactile cues General Comments: pt continues to have decreased awareness of deficits and poor safety.      Exercises    General Comments        Pertinent Vitals/Pain Pain Assessment: No/denies pain    Home Living                      Prior Function            PT Goals (current goals can now be found in the care plan section) Acute Rehab PT Goals Patient Stated Goal: Go home and eat. PT Goal Formulation: With patient Time For Goal Achievement: 03/11/14 Potential to Achieve Goals: Good Progress towards PT goals: Progressing toward goals    Frequency  Min 3X/week    PT Plan Current plan remains appropriate    Co-evaluation  End of Session Equipment Utilized During Treatment: Gait belt Activity Tolerance: Patient tolerated treatment well Patient left: in chair;with call bell/phone within reach;with chair alarm set     Time: [phone] PT Time Calculation (min) (ACUTE ONLY): 22 min  Charges:  $Therapeutic Activity: 8-22 mins                    G CodesCatarina Hartshorn, [place] 02/27/2014, 2:08 PM

## 2014-02-27 NOTE — Progress Notes (Signed)
I met with pt at bedside. We discussed an inpt rehab admission before ready for discharge home. Pt is in agreement. Noted likely ready Monday. I will follow. [phone]

## 2014-02-28 NOTE — Progress Notes (Signed)
Pt seen and examined. No issues overnight. No complaints x insomnia.  EXAM: Temp:  [97.7 F (36.5 C)-99.4 F (37.4 C)] 97.7 F (36.5 C) (01/08 0800) Pulse Rate:  [69-96] 79 (01/08 0900) Resp:  [14-23] 20 (01/08 0900) BP: (105-154)/(76-104) 143/93 mmHg (01/08 0900) SpO2:  [96 %-100 %] 97 % (01/08 0900) Intake/Output      01/07 0701 - 01/08 0700 01/08 0701 - 01/09 0700   P.O. 480 360   I.V. (mL/kg) 2400 (35.8) 200 (3)   Total Intake(mL/kg) 2880 (42.9) 560 (8.3)   Urine (mL/kg/hr) 1850 (1.1) 200 (1.1)   Total Output 1850 200   Net +1030 +360        Urine Occurrence 1201 x    Stool Occurrence 1 x 1 x     Awake, alert, oriented Speech fluent, appropriate CN grossly intact Good strength BUE/BLE, minimal LUE drift  LABS: Lab Results  Component Value Date   CREATININE 1.15 02/20/2014   BUN 22 02/20/2014   NA 132* 02/20/2014   K 3.9 02/20/2014   CL 97 02/20/2014   CO2 29 02/20/2014   Lab Results  Component Value Date   WBC 14.4* 02/20/2014   HGB 13.6 02/20/2014   HCT 41.2 02/20/2014   MCV 87.8 02/20/2014   PLT 220 02/20/2014    IMPRESSION: - 69 y.o. male SAH d# 11 s/p RICA aneurysm coiling, neurologically stable, no clinical spasm evident  PLAN: - Cont monitoring - Mobilize - Plan on transfer to CIR on Mon if stable through the weekend.

## 2014-02-28 NOTE — Progress Notes (Signed)
UR completed. Likely for CIR on 03/03/2014.  Sandi Mariscal, RN BSN [place] CCM Trauma/Neuro ICU Case Manager [phone]

## 2014-02-28 NOTE — Progress Notes (Signed)
Speech Language Pathology Treatment: Dysphagia  Patient Details Name: Brian [name] MRN: [id number] DOB: 30-Mar-1945 Today's Date: 02/28/2014 Time: [id number] SLP Time Calculation (min) (ACUTE ONLY): 13 min  Assessment / Plan / Recommendation Clinical Impression  Pt with improved sustained and selective attention during self-feeding.  Fed himself pudding, water from straw with improved functional mastication, reduced pocketing, and only min verbal cues needed for safe consumption.   Recommend continuing current diet through weekend.  Pt's cognitive deficits will warrant an SLP evaluation once D/Cd to CIR on Monday.     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 PMH of HTN, CVA, colitis, renal disorder, sepsis.    Pertinent Vitals Pain Assessment: No/denies pain  SLP Plan  Continue with current plan of care    Recommendations Diet recommendations: Dysphagia 2 (fine chop);Thin liquid Liquids provided via: Cup;Straw Medication Administration: Whole meds with puree Supervision: Patient able to self feed;Full supervision/cueing for compensatory strategies Compensations: Slow rate;Small sips/bites;Check for pocketing Postural Changes and/or Swallow Maneuvers: Seated upright 90 degrees              Oral Care Recommendations: Oral care BID Follow up Recommendations: Inpatient Rehab Plan: Continue with current plan of care   Journey Castonguay L. Tivis Ringer, [place] CCC/SLP Pager [phone]      [name] Laurice 02/28/2014, 11:46 AM

## 2014-03-01 MED ORDER — PANTOPRAZOLE SODIUM 40 MG PO TBEC
40.0000 mg | DELAYED_RELEASE_TABLET | Freq: Every day | ORAL | Status: DC
  Administered 2014-03-01 – 2014-03-02 (×2): 40 mg via ORAL
  Filled 2014-03-01 (×2): qty 1

## 2014-03-01 MED ORDER — CETYLPYRIDINIUM CHLORIDE 0.05 % MT LIQD
7.0000 mL | Freq: Two times a day (BID) | OROMUCOSAL | Status: DC
  Administered 2014-03-02 – 2014-03-03 (×3): 7 mL via OROMUCOSAL

## 2014-03-01 NOTE — Progress Notes (Signed)
Patient ID: Brian [name], male   DOB: [birth year], 69 y.o.   MRN: [id number] Subjective:  The patient is alert and pleasant. He wants to go home.  Objective: Vital signs in last 24 hours: Temp:  [97.4 F (36.3 C)-99.3 F (37.4 C)] 97.8 F (36.6 C) (01/09 0758) Pulse Rate:  [72-108] 72 (01/09 0700) Resp:  [13-24] 16 (01/09 0700) BP: (107-156)/(70-114) 139/114 mmHg (01/09 0700) SpO2:  [95 %-100 %] 99 % (01/09 0700)  Intake/Output from previous day: 01/08 0701 - 01/09 0700 In: 3750 [P.O.:1350; I.V.:2400] Out: 2975 [Urine:2975] Intake/Output this shift:    Physical exam the patient is alert and oriented. His speech is normal. He is mildly left hemiparetic. His pupils are equal.  Lab Results: No results for input(s): WBC, HGB, HCT, PLT in the last 72 hours. BMET No results for input(s): NA, K, CL, CO2, GLUCOSE, BUN, CREATININE, CALCIUM in the last 72 hours.  Studies/Results: No results found.  Assessment/Plan: Status post subarachnoid hemorrhage: The patient is slowly improving neurologically.  LOS: 9 days     Ollie Delano D 03/01/2014, 8:25 AM

## 2014-03-02 NOTE — Progress Notes (Signed)
Patient ID: Brian [name], male   DOB: 1945/06/22, 69 y.o.   MRN: [id number] Subjective:  The patient is alert and pleasant. He says he wants to go home.  Objective: Vital signs in last 24 hours: Temp:  [97.3 F (36.3 C)-98.5 F (36.9 C)] 97.6 F (36.4 C) (01/10 0400) Pulse Rate:  [75-96] 79 (01/10 0800) Resp:  [15-27] 15 (01/10 0800) BP: (108-156)/(73-112) 137/96 mmHg (01/10 0800) SpO2:  [95 %-100 %] 98 % (01/10 0800) Weight:  [63.9 kg (140 lb 14 oz)] 63.9 kg (140 lb 14 oz) (01/10 0400)  Intake/Output from previous day: 01/09 0701 - 01/10 0700 In: 2880 [P.O.:480; I.V.:2400] Out: 2600 [Urine:2600] Intake/Output this shift: Total I/O In: 100 [I.V.:100] Out: -   Physical exam the patient is alert and oriented 2. He is mildly left hemiparetic.  Lab Results: No results for input(s): WBC, HGB, HCT, PLT in the last 72 hours. BMET No results for input(s): NA, K, CL, CO2, GLUCOSE, BUN, CREATININE, CALCIUM in the last 72 hours.  Studies/Results: No results found.  Assessment/Plan: Postop day #10: The patient is neurologically stable.  LOS: 10 days     Brian [name] D 03/02/2014, 8:31 AM

## 2014-03-03 ENCOUNTER — Inpatient Hospital Stay (HOSPITAL_COMMUNITY)
Admission: RE | Admit: 2014-03-03 | Discharge: 2014-03-19 | DRG: 057 | Disposition: A | Discharge status: Home Health | Payer: Medicare Other | Source: Intra-hospital | Attending: Physical Medicine & Rehabilitation | Admitting: Physical Medicine & Rehabilitation

## 2014-03-03 DIAGNOSIS — I10 Essential (primary) hypertension: Secondary | ICD-10-CM | POA: Diagnosis not present

## 2014-03-03 DIAGNOSIS — F09 Unspecified mental disorder due to known physiological condition: Secondary | ICD-10-CM | POA: Diagnosis not present

## 2014-03-03 DIAGNOSIS — M961 Postlaminectomy syndrome, not elsewhere classified: Secondary | ICD-10-CM | POA: Diagnosis not present

## 2014-03-03 DIAGNOSIS — M21372 Foot drop, left foot: Secondary | ICD-10-CM

## 2014-03-03 DIAGNOSIS — R1310 Dysphagia, unspecified: Secondary | ICD-10-CM

## 2014-03-03 DIAGNOSIS — G2581 Restless legs syndrome: Secondary | ICD-10-CM | POA: Diagnosis not present

## 2014-03-03 DIAGNOSIS — Z22322 Carrier or suspected carrier of Methicillin resistant Staphylococcus aureus: Secondary | ICD-10-CM | POA: Diagnosis not present

## 2014-03-03 DIAGNOSIS — G8190 Hemiplegia, unspecified affecting unspecified side: Secondary | ICD-10-CM | POA: Diagnosis not present

## 2014-03-03 DIAGNOSIS — G629 Polyneuropathy, unspecified: Secondary | ICD-10-CM

## 2014-03-03 DIAGNOSIS — G4700 Insomnia, unspecified: Secondary | ICD-10-CM

## 2014-03-03 DIAGNOSIS — R252 Cramp and spasm: Secondary | ICD-10-CM

## 2014-03-03 DIAGNOSIS — I69054 Hemiplegia and hemiparesis following nontraumatic subarachnoid hemorrhage affecting left non-dominant side: Secondary | ICD-10-CM | POA: Diagnosis present

## 2014-03-03 DIAGNOSIS — D6859 Other primary thrombophilia: Secondary | ICD-10-CM | POA: Diagnosis not present

## 2014-03-03 DIAGNOSIS — I609 Nontraumatic subarachnoid hemorrhage, unspecified: Secondary | ICD-10-CM

## 2014-03-03 DIAGNOSIS — G8194 Hemiplegia, unspecified affecting left nondominant side: Secondary | ICD-10-CM

## 2014-03-03 DIAGNOSIS — Z8673 Personal history of transient ischemic attack (TIA), and cerebral infarction without residual deficits: Secondary | ICD-10-CM | POA: Diagnosis not present

## 2014-03-03 DIAGNOSIS — Z87891 Personal history of nicotine dependence: Secondary | ICD-10-CM

## 2014-03-03 DIAGNOSIS — F4321 Adjustment disorder with depressed mood: Secondary | ICD-10-CM | POA: Diagnosis not present

## 2014-03-03 DIAGNOSIS — B370 Candidal stomatitis: Secondary | ICD-10-CM

## 2014-03-03 MED ORDER — ONDANSETRON HCL 4 MG/2ML IJ SOLN: 4.0000 mg | Freq: Four times a day (QID) | INTRAMUSCULAR | Status: DC | PRN

## 2014-03-03 MED ORDER — PANTOPRAZOLE SODIUM 40 MG PO TBEC
40.0000 mg | DELAYED_RELEASE_TABLET | Freq: Every day | ORAL | Status: DC
  Administered 2014-03-03 – 2014-03-04 (×2): 40 mg via ORAL
  Filled 2014-03-03 (×3): qty 1

## 2014-03-03 MED ORDER — SENNOSIDES-DOCUSATE SODIUM 8.6-50 MG PO TABS
1.0000 | ORAL_TABLET | Freq: Two times a day (BID) | ORAL | Status: DC
  Administered 2014-03-04 – 2014-03-19 (×30): 1 via ORAL
  Filled 2014-03-03 (×24): qty 1
  Filled 2014-03-03: qty 2
  Filled 2014-03-03 (×5): qty 1

## 2014-03-03 MED ORDER — ONDANSETRON HCL 4 MG PO TABS: 4.0000 mg | ORAL_TABLET | Freq: Four times a day (QID) | ORAL | Status: DC | PRN

## 2014-03-03 MED ORDER — NIMODIPINE 30 MG PO CAPS
60.0000 mg | ORAL_CAPSULE | ORAL | Status: AC
  Administered 2014-03-07 – 2014-03-13 (×37): 60 mg via ORAL
  Filled 2014-03-03 (×62): qty 2

## 2014-03-03 MED ORDER — NIMODIPINE 60 MG/20ML PO SOLN
60.0000 mg | ORAL | Status: AC
  Administered 2014-03-03 – 2014-03-09 (×20): 60 mg
  Filled 2014-03-03 (×63): qty 20

## 2014-03-03 MED ORDER — NIMODIPINE 30 MG PO CAPS: 60.0000 mg | ORAL_CAPSULE | ORAL | Status: DC

## 2014-03-03 MED ORDER — SORBITOL 70 % SOLN: 30.0000 mL | Freq: Every day | Status: DC | PRN

## 2014-03-03 MED ORDER — ENSURE COMPLETE PO LIQD
237.0000 mL | Freq: Two times a day (BID) | ORAL | Status: DC
  Administered 2014-03-03 – 2014-03-19 (×25): 237 mL via ORAL

## 2014-03-03 MED ORDER — ACETAMINOPHEN 325 MG PO TABS
325.0000 mg | ORAL_TABLET | ORAL | Status: DC | PRN
  Administered 2014-03-04 – 2014-03-15 (×7): 650 mg via ORAL
  Administered 2014-03-17: 325 mg via ORAL
  Administered 2014-03-19: 650 mg via ORAL
  Filled 2014-03-03 (×10): qty 2

## 2014-03-03 NOTE — Discharge Summary (Signed)
Physician Discharge Summary  Patient ID: Brian [name] MRN: [id number] DOB/AGE: 1945-03-16 69 y.o.  Admit date: 02/20/2014 Discharge date: 03/03/2014  Admission Diagnoses: Subarachnoid Hemorrhage  Discharge Diagnoses: Same Active Problems:   Subarachnoid hemorrhage due to ruptured aneurysm   Subarachnoid hematoma   Discharged Condition: Stable  Hospital Course:  Brian [name] is a 69 y.o. male who was initially admitted after presenting to the emergency department with 3-4 days of headache, nausea, and vomiting. CT scan was done under stem is rated subarachnoid hemorrhage. 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 The patient was then returned to the neuro intensive care unit for close observation. Initially postoperatively he did have a left-sided neglect which slowly but progressively resolved during his hospital stay. He did not have any deterioration in neurologic condition to suggest clinical vasospasm. He did not have any other significant Hospital competitions. After 14 days from his subarachnoid hemorrhage, he was deemed stable for discharge. At that time, he had been evaluated by physical and occupational therapy and was deemed a good candidate for inpatient rehabilitation. He was tolerating diet, pain was under control with oral medication.  Treatments: Surgery - diagnostic cerebral angiogram and coil embolization of a right internal carotid artery aneurysm.  Discharge Exam: Blood pressure 124/92, pulse 78, temperature 97.4 F (36.3 C), temperature source Oral, resp. rate 17, height 5' 7.5" (1.715 m), weight 64.5 kg (142 lb 3.2 oz), SpO2 98 %. Awake, alert, oriented Speech fluent, appropriate CN grossly intact 5/5 BUE/BLE Wound c/d/i  Follow-up: Follow-up in my office [facility] [phone]) in 4-6 weeks  Disposition: 01-Home or Self Care     Medication List     STOP taking these medications        diphenoxylate-atropine 2.5-0.025 MG per tablet  Commonly known as:  LOMOTIL     feeding supplement (ENSURE COMPLETE) Liqd     fentaNYL 25 MCG/HR patch  Commonly known as:  DURAGESIC - dosed mcg/hr     ondansetron 4 MG tablet  Commonly known as:  ZOFRAN     warfarin 2.5 MG tablet  Commonly known as:  COUMADIN      TAKE these medications        acetaminophen 325 MG tablet  Commonly known as:  TYLENOL  Take 650 mg by mouth every 4 (four) hours as needed. For temp > 101.     albuterol (5 MG/ML) 0.5% nebulizer solution  Commonly known as:  PROVENTIL  Take 0.5 mLs (2.5 mg total) by nebulization every 4 (four) hours as needed for wheezing.     amLODipine 10 MG tablet  Commonly known as:  NORVASC  Take 10 mg by mouth daily.     doxepin 25 MG capsule  Commonly known as:  SINEQUAN  Take 50 mg by mouth at bedtime.     DYAZIDE 37.5-25 MG per capsule  Generic drug:  triamterene-hydrochlorothiazide  Take 1 capsule by mouth daily.     HYDROcodone-acetaminophen 10-325 MG per tablet  Commonly known as:  NORCO  Take 1 tablet by mouth every 6 (six) hours as needed for moderate pain or severe pain.     niMODipine 30 MG capsule  Commonly known as:  NIMOTOP  Take 2 capsules (60 mg total) by mouth every 4 (four) hours.     tiZANidine 2 MG tablet  Commonly known as:  ZANAFLEX  Take 2 mg by mouth every 8 (eight) hours as needed for muscle  spasms.     traMADol 50 MG tablet  Commonly known as:  ULTRAM  Take 50-100 mg by mouth every 6 (six) hours as needed for moderate pain.         SignedConsuella Lose, C 03/03/2014, 2:02 PM

## 2014-03-03 NOTE — PMR Pre-admission (Signed)
PMR Admission Coordinator Pre-Admission Assessment  Patient: Brian [name] is an 69 y.o., male MRN: [id number] DOB: 1946-01-18 Height: 5' 7.5" (171.5 cm) Weight: 64.5 kg (142 lb 3.2 oz)              Insurance Information HMO:     PPO:      PCP:      IPA:      80/20: yes     OTHER: no HMO PRIMARY: Medicare a and b      Policy#: [id number]      Subscriber: pt Benefits:  Phone #: [email or web address]      Name: 02/26/14 Eff. Date: 10/22/2009     Deduct: $1288      Out of Pocket Max: none      Life Max: none CIR: 100%      SNF: 20 full days Outpatient: 80%     Co-Pay: 20% Home Health: 100%      Co-Pay: none DME: 80%     Co-Pay: 20% Providers: pt choice  SECONDARY: Medicaid [place] Access      Policy#: [id number]      Subscriber: pt Medicaid Application Date:       Case Manager:  Disability Application Date:       Case Worker:   Emergency Contact Information Contact Information    Name Relation Home Work Hartly Daughter [phone]  [phone]   Corky Crafts   [phone]     Current Medical History  Patient Admitting Diagnosis: right ICA aneurysm with right SAH  History of Present Illness: Brian [name] is a 69 y.o. right handed male with history of tobacco abuse, hypertension,? CVA 2 as well as hypercoagulable state maintained on chronic Coumadin. As reported patient had recently ran out of his medication for the past week. He lives with his son used a walker prior to admission.   Presented to [facility] 02/19/2014 with headache and neck pain. Blood pressure 161/113. INR upon admission of 1.48. 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 Patient was transferred to [facility] for ongoing evaluation. 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 Kathyrn Sheriff 02/20/2014. Patient remains Nimotop monitoring for any vasospasms. 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 He is on a dysphagia 2 thin liquid diet. Reported to be impulsive with poor safety awareness.    Total: 3 NIH    Past Medical History  Past Medical History  Diagnosis Date  . Hypertension   . CVA (cerebral infarction)   . 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 Renal disorder   . Sepsis(995.91)   . Vascular insufficiency   . Stroke     Family History  family history is negative for Colon cancer and Liver disease.  Prior Rehab/Hospitalizations: SNF at [facility] 2013 for 2 months  Current Medications  Current facility-administered medications: 0.9 %  sodium chloride infusion, , Intravenous, Continuous, [name], MD, Last Rate: 100 mL/hr at 03/03/14 0744;  acetaminophen (TYLENOL) tablet 650 mg, 650 mg, Oral, Q4H PRN, 650 mg at 03/03/14 0156 **OR** acetaminophen (TYLENOL) suppository 650 mg, 650 mg, Rectal, Q4H PRN, [name], MD antiseptic oral rinse (CPC / CETYLPYRIDINIUM CHLORIDE 0.05%) solution 7 mL, 7 mL, Mouth Rinse, BID, [name], MD, 7 mL at 03/03/14 1000;  feeding supplement (ENSURE COMPLETE) (ENSURE COMPLETE) liquid 237 mL, 237 mL, Oral, BID BM, Heather Cornelison  Charlie Pitter, RD, 237 mL at 03/02/14 2014;  labetalol (NORMODYNE,TRANDATE) injection 10-40 mg, 10-40 mg, Intravenous, Q10 min PRN, [name], MD, 20 mg at 02/26/14 1712 nicardipine (CARDENE) 20mg  in 0.86% saline 270ml IV infusion (0.1 mg/ml), 3-15 mg/hr, Intravenous, Continuous, [name], MD, Stopped at 02/21/14 0600;  niMODipine (NIMOTOP) capsule 60 mg, 60 mg, Oral, Q4H, 60 mg at 02/24/14 0400 **OR** NiMODipine (NYMALIZE) 60 MG/20ML oral solution 60 mg, 60 mg, Per Tube, Q4H, [name], MD, 60 mg at 03/03/14 0744 pantoprazole (PROTONIX) EC tablet 40 mg, 40 mg, Oral, QHS, [name], MD, 40 mg at 03/02/14 2217;  senna-docusate (Senokot-S) tablet 1 tablet, 1 tablet, Oral, BID, [name], MD, 1 tablet at 03/02/14  2217  Patients Current Diet: DIET DYS 2 with thin liquids  Precautions / Restrictions Precautions Precautions: Fall Restrictions Weight Bearing Restrictions: No   Prior Activity Level Limited Community (1-2x/wk): does his own cooking and adls pta Has PCS aide 2 hrs per day Monday through Friday She does his laundry, runs errands, etc. Uses cane or RW in home, power w/c in the [place] Devices/Equipment: Radio producer (specify quad or straight), Eyeglasses, Electric scooter  Prior Functional Level Prior Function Level of Independence: Independent Comments: uses cane in home; power w/c in community  Current Functional Level Cognition  Overall Cognitive Status: Impaired/Different from baseline Current Attention Level: Selective Orientation Level: Oriented X4 Following Commands: Follows one step commands consistently, Follows multi-step commands with increased time Safety/Judgement: Decreased awareness of safety, Decreased awareness of deficits General Comments: pt continues to have decreased awareness of deficits and poor safety.      Extremity Assessment (includes Sensation/Coordination)          ADLs  Overall ADL's : Needs assistance/impaired Eating/Feeding: Supervision/ safety, Bed level Eating/Feeding Details (indicate cue type and reason): Pt compliant with sipping mild as instructed by ST. Grooming: Oral care, Wash/dry hands, Wash/dry face, Bed level, Minimal assistance Grooming Details (indicate cue type and reason): Pt spontaneously used L UE to stabilize toothpaste box and tube while R opened.  Grasp of toothbrush with L hand while placing toothpaste was ineffective. General ADL Comments: Pt required moderate verbal cues to sequence setting up for toothbrushing, fair thoroughness with oral care and face and hand washing.    Mobility  Overal bed mobility: Needs Assistance Bed Mobility: Supine to Sit Supine to sit: Min  assist, HOB elevated General bed mobility comments: pt needs increased time, but continues to need A bringing trunk up to sitting.      Transfers  Overall transfer level: Needs assistance Equipment used: 2 person hand held assist Transfers: Sit to/from Stand Sit to Stand: Mod assist, +2 physical assistance Stand pivot transfers: Mod assist, +2 physical assistance General transfer comment: pt demos good use of UEs today, but needs A positioning LEs prior to coming to stand and controlling descent to sitting.      Ambulation / Gait / Stairs / Wheelchair Mobility  Ambulation/Gait Ambulation/Gait assistance: Mod assist, +2 physical assistance, +2 safety/equipment Ambulation Distance (Feet): 8 Feet Assistive device: 2 person hand held assist Gait Pattern/deviations: Step-through pattern, Decreased stride length, Narrow base of support, Scissoring, Trunk flexed General Gait Details: pt needs 2 person A for balance and close chair follow.  pt has difficulty maintaining a wider BOS 2/2 increased tone and ataxic type gait.      Posture / Balance      Special needs/care consideration  Bowel mgmt: continent Bladder mgmt: condom  catheter    Previous Home Environment Living Arrangements: Children  Lives With: Son Available Help at Discharge: Family, Available 24 hours/day Type of Home: House Home Layout: One level Home Access: Stairs to enter [employer] of Steps: 1 to 2 Bathroom Shower/Tub: Chiropodist: Standard Bathroom Accessibility: Yes How Accessible: Accessible via walker [place]: Yes Type of Home Care Services: Other (Comment) (PCS aide 2 to 3 hrs/day) Home Care Agency (if known): [facility] [place], [place], lives with pt. Needed to do very little to assist pt pta  Discharge Living Setting Plans for Discharge Living Setting: Patient's home, Lives with (comment), Other (Comment) (Taj, son lives with pt) Type of Home at  Discharge: House Discharge Home Layout: One level Discharge Home Access: Stairs to enter Entrance Stairs-Number of Steps: 1 to 2 Discharge Bathroom Shower/Tub: Tub/shower unit Discharge Bathroom Toilet: Standard Discharge Bathroom Accessibility: Yes How Accessible: Accessible via walker Does the patient have any problems obtaining your medications?: No  Taj is aware that his Dad will need 24/7 supervisision at d/c as well as need more assist than he did pta such as cooking  Social/Family/Support Systems Patient Roles: Parent Contact Information: Denman George, daughter and son, Jack Quarto Anticipated Caregiver: Jack Quarto, son Anticipated Caregiver's Contact Information: see above Ability/Limitations of Caregiver: Jack Quarto can provide 24/7 supervision Caregiver Availability: 24/7 Discharge Plan Discussed with Primary Caregiver: Yes Is Caregiver In Agreement with Plan?: Yes Does Caregiver/Family have Issues with Lodging/Transportation while Pt is in Rehab?: No  Goals/Additional Needs Patient/Family Goal for Rehab: supervision with PT, OT, and SLP Expected length of stay: ELOS 10-14 days Dietary Needs: Dysphagia 2 diet with thin liquids Pt/Family Agrees to Admission and willing to participate: Yes Program Orientation Provided & Reviewed with Pt/Caregiver Including Roles  & Responsibilities: Yes   Decrease burden of Care through IP rehab admission: n/a  Possible need for SNF placement upon discharge:not anticipated  Patient Condition: This patient's medical and functional status has changed since the consult dated: 02/25/2014 in which the Rehabilitation Physician determined and documented that the patient's condition is appropriate for intensive rehabilitative care in an inpatient rehabilitation facility. See "History of Present Illness" (above) for medical update. Functional changes are: overall mod assist. Patient's medical and functional status update has been discussed with the Rehabilitation physician and  patient remains appropriate for inpatient rehabilitation. Will admit to inpatient rehab today.  Preadmission Screen Completed By:  [name], 03/03/2014 11:44 AM ______________________________________________________________________   Discussed status with Dr. Naaman Plummer on 03/03/2014 at  1144 and received telephone approval for admission today.  Admission Coordinator:  [name], time 1791 Date 03/03/2014

## 2014-03-03 NOTE — Progress Notes (Signed)
Physical Therapy Treatment Patient Details Name: Brian [name] MRN: [id number] DOB: 03-14-1945 Today's Date: 03/03/2014    History of Present Illness pt presents post RICA Aneurysm coiling. Pt with hx of CVA and residual L side weakness, but unclear to what degree.    PT Comments    Pt continues to require A for all aspects of mobility.  Pt scissoring during gait and has difficulty correcting.  Continue to feel CIR most appropriate D/C plan for pt.  Will continue to follow.    Follow Up Recommendations  CIR     Equipment Recommendations  None recommended by PT    Recommendations for Other Services       Precautions / Restrictions Precautions Precautions: Fall Restrictions Weight Bearing Restrictions: No    Mobility  Bed Mobility               General bed mobility comments: pt sitting on 3-in-1 with Nsg.    Transfers Overall transfer level: Needs assistance Equipment used: 2 person hand held assist Transfers: Sit to/from Stand Sit to Stand: Mod assist;+2 physical assistance         General transfer comment: cues for UE use and positioning of LEs.    Ambulation/Gait Ambulation/Gait assistance: Mod assist;+2 physical assistance Ambulation Distance (Feet): 20 Feet Assistive device: 2 person hand held assist Gait Pattern/deviations: Step-through pattern;Decreased stride length;Scissoring;Trunk flexed;Narrow base of support     General Gait Details: pt has difficulty widening BOS and maintaining upright posture.  Ataxic type gait.     Stairs            Wheelchair Mobility    Modified Rankin (Stroke Patients Only) Modified Rankin (Stroke Patients Only) Pre-Morbid Rankin Score: No symptoms Modified Rankin: Moderately severe disability     Balance Overall balance assessment: Needs assistance Sitting-balance support: No upper extremity supported;Feet supported Sitting balance-Leahy Scale: Poor Sitting balance - Comments: pt needs A to maintain  balance without UE support.     Standing balance support: During functional activity Standing balance-Leahy Scale: Poor                      Cognition Arousal/Alertness: Awake/alert Behavior During Therapy: Impulsive Overall Cognitive Status: Impaired/Different from baseline Area of Impairment: Attention;Memory;Following commands;Safety/judgement;Awareness;Problem solving   Current Attention Level: Selective   Following Commands: Follows one step commands consistently;Follows multi-step commands with increased time Safety/Judgement: Decreased awareness of safety;Decreased awareness of deficits Awareness: Emergent Problem Solving: Difficulty sequencing;Requires verbal cues;Requires tactile cues General Comments: pt continues to have decreased awareness of deficits and poor safety.      Exercises      General Comments        Pertinent Vitals/Pain Pain Assessment: No/denies pain    Home Living                      Prior Function            PT Goals (current goals can now be found in the care plan section) Acute Rehab PT Goals Patient Stated Goal: Go home and eat. PT Goal Formulation: With patient Time For Goal Achievement: 03/11/14 Potential to Achieve Goals: Good Progress towards PT goals: Progressing toward goals    Frequency  Min 3X/week    PT Plan Current plan remains appropriate    Co-evaluation             End of Session Equipment Utilized During Treatment: Gait belt Activity Tolerance: Patient tolerated treatment well Patient left:  in chair;with call bell/phone within reach;with chair alarm set     Time: [phone] PT Time Calculation (min) (ACUTE ONLY): 24 min  Charges:  $Gait Training: 8-22 mins $Therapeutic Activity: 8-22 mins                    G CodesCatarina Hartshorn, [place] 03/03/2014, 1:17 PM
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
## 2014-03-03 NOTE — Progress Notes (Signed)
Pt in agreement to inpt rehab admission today. I contacted daughter by phone and she is also aware. I will make the arrangements. [phone]

## 2014-03-03 NOTE — Progress Notes (Signed)
Patient discharged to rehab. Discharge instructions and medication instructions given to patient, no questions or concerns voiced by the patient.

## 2014-03-03 NOTE — Progress Notes (Signed)
[name], RN Rehab Admission Coordinator Signed Physical Medicine and Rehabilitation PMR Pre-admission 03/03/2014 11:36 AM  Related encounter: ED to Hosp-Admission (Discharged) from 02/20/2014 in [place] ICU    Expand All Collapse All   PMR Admission Coordinator Pre-Admission Assessment  Patient: Brian [name] is an 69 y.o., male MRN: [id number] DOB: 05-26-1945 Height: 5' 7.5" (171.5 cm) Weight: 64.5 kg (142 lb 3.2 oz)  Insurance Information HMO: PPO: PCP: IPA: 80/20: yes OTHER: no HMO PRIMARY: Medicare a and b Policy#: [id number] Subscriber: pt Benefits: Phone #: [email or web address] Name: 02/26/14 Eff. Date: 10/22/2009 Deduct: $1288 Out of Pocket Max: none Life Max: none CIR: 100% SNF: 20 full days Outpatient: 80% Co-Pay: 20% Home Health: 100% Co-Pay: none DME: 80% Co-Pay: 20% Providers: pt choice  SECONDARY: Medicaid [place] Access Policy#: [id number] o Subscriber: pt Medicaid Application Date: Case Manager:  Disability Application Date: Case Worker:   Emergency Contact Information Contact Information    Name Relation Home Work [place] Daughter [phone]  [phone]   Corky Crafts   [phone]     Current Medical History  Patient Admitting Diagnosis: right ICA aneurysm with right SAH  History of Present Illness: Brian [name] is a 69 y.o. right handed male with history of tobacco abuse, hypertension,? CVA 2 as well as hypercoagulable state maintained on chronic Coumadin. As reported patient had recently ran out of his medication for the past week. He lives with his son used a walker prior to admission.  Presented to [facility] 02/19/2014 with headache and neck pain.  Blood pressure 161/113. INR upon admission of 1.48. 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 Patient was transferred to [facility] for ongoing evaluation. 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 Kathyrn Sheriff 02/20/2014. Patient remains Nimotop monitoring for any vasospasms. MRSA PCR screening positive maintained on contact precautions. He is on a dysphagia 2 thin liquid diet. Reported to be impulsive with poor safety awareness.    Total: 3 NIH    Past Medical History  Past Medical History  Diagnosis Date  . Hypertension   . CVA (cerebral infarction)   . 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 Renal disorder   . Sepsis(995.91)   . Vascular insufficiency   . Stroke     Family History  family history is negative for Colon cancer and Liver disease.  Prior Rehab/Hospitalizations: SNF at [facility] 2013 for 2 months  Current Medications  Current facility-administered medications: 0.9 % sodium chloride infusion, , Intravenous, Continuous, [name], MD, Last Rate: 100 mL/hr at 03/03/14 0744; acetaminophen (TYLENOL) tablet 650 mg, 650 mg, Oral, Q4H PRN, 650 mg at 03/03/14 0156 **OR** acetaminophen (TYLENOL) suppository 650 mg, 650 mg, Rectal, Q4H PRN, [name], MD antiseptic oral rinse (CPC / CETYLPYRIDINIUM CHLORIDE 0.05%) solution 7 mL, 7 mL, Mouth Rinse, BID, [name], MD, 7 mL at 03/03/14 1000; feeding supplement (ENSURE COMPLETE) (ENSURE COMPLETE) liquid 237 mL, 237 mL, Oral, BID BM, [name], RD, 237 mL at 03/02/14 2014; labetalol (NORMODYNE,TRANDATE) injection 10-40 mg, 10-40 mg, Intravenous, Q10 min PRN, [name], MD, 20 mg at 02/26/14 1712 nicardipine (CARDENE) 20mg  in 0.86% saline 271ml IV infusion (0.1 mg/ml), 3-15 mg/hr, Intravenous, Continuous, [name], MD, Stopped at 02/21/14 0600; niMODipine (NIMOTOP) capsule 60 mg, 60 mg, Oral, Q4H, 60 mg at 02/24/14 0400 **OR** NiMODipine (  NYMALIZE) 60 MG/20ML oral solution 60 mg, 60 mg, Per Tube, Q4H, [name], MD, 60 mg at 03/03/14 0744 pantoprazole (PROTONIX) EC tablet 40 mg, 40 mg, Oral, QHS, [name], MD, 40 mg at 03/02/14 2217; senna-docusate (Senokot-S) tablet 1 tablet, 1 tablet, Oral, BID, [name], MD, 1 tablet at 03/02/14 2217  Patients Current Diet: DIET DYS 2 with thin liquids  Precautions / Restrictions Precautions Precautions: Fall Restrictions Weight Bearing Restrictions: No   Prior Activity Level Limited Community (1-2x/wk): does his own cooking and adls pta Has PCS aide 2 hrs per day Monday through Friday She does his laundry, runs errands, etc. Uses cane or RW in home, power w/c in the [place] Devices/Equipment: Radio producer (specify quad or straight), Eyeglasses, Electric scooter  Prior Functional Level Prior Function Level of Independence: Independent Comments: uses cane in home; power w/c in community  Current Functional Level Cognition  Overall Cognitive Status: Impaired/Different from baseline Current Attention Level: Selective Orientation Level: Oriented X4 Following Commands: Follows one step commands consistently, Follows multi-step commands with increased time Safety/Judgement: Decreased awareness of safety, Decreased awareness of deficits General Comments: pt continues to have decreased awareness of deficits and poor safety.    Extremity Assessment (includes Sensation/Coordination)          ADLs  Overall ADL's : Needs assistance/impaired Eating/Feeding: Supervision/ safety, Bed level Eating/Feeding Details (indicate cue type and reason): Pt compliant with sipping mild as instructed by ST. Grooming: Oral care, Wash/dry hands, Wash/dry face, Bed level, Minimal  assistance Grooming Details (indicate cue type and reason): Pt spontaneously used L UE to stabilize toothpaste box and tube while R opened. Grasp of toothbrush with L hand while placing toothpaste was ineffective. General ADL Comments: Pt required moderate verbal cues to sequence setting up for toothbrushing, fair thoroughness with oral care and face and hand washing.    Mobility  Overal bed mobility: Needs Assistance Bed Mobility: Supine to Sit Supine to sit: Min assist, HOB elevated General bed mobility comments: pt needs increased time, but continues to need A bringing trunk up to sitting.     Transfers  Overall transfer level: Needs assistance Equipment used: 2 person hand held assist Transfers: Sit to/from Stand Sit to Stand: Mod assist, +2 physical assistance Stand pivot transfers: Mod assist, +2 physical assistance General transfer comment: pt demos good use of UEs today, but needs A positioning LEs prior to coming to stand and controlling descent to sitting.     Ambulation / Gait / Stairs / Wheelchair Mobility  Ambulation/Gait Ambulation/Gait assistance: Mod assist, +2 physical assistance, +2 safety/equipment Ambulation Distance (Feet): 8 Feet Assistive device: 2 person hand held assist Gait Pattern/deviations: Step-through pattern, Decreased stride length, Narrow base of support, Scissoring, Trunk flexed General Gait Details: pt needs 2 person A for balance and close chair follow. pt has difficulty maintaining a wider BOS 2/2 increased tone and ataxic type gait.     Posture / Balance      Special needs/care consideration  Bowel mgmt: continent Bladder mgmt: condom catheter    Previous Home Environment Living Arrangements: Children Lives With: Son Available Help at Discharge: Family, Available 24 hours/day Type of Home: House Home Layout: One level Home Access: Stairs to enter [employer] of Steps: 1 to 2 Bathroom Shower/Tub:  Chiropodist: Standard Bathroom Accessibility: Yes How Accessible: Accessible via walker [place]: Yes Type of Home Care Services: Other (Comment) (PCS aide 2 to 3 hrs/day) Home  Care Agency (if known): [facility] [place], [place], lives with pt. Needed to do very little to assist pt pta  Discharge Living Setting Plans for Discharge Living Setting: Patient's home, Lives with (comment), Other (Comment) (Taj, son lives with pt) Type of Home at Discharge: House Discharge Home Layout: One level Discharge Home Access: Stairs to enter Entrance Stairs-Number of Steps: 1 to 2 Discharge Bathroom Shower/Tub: Tub/shower unit Discharge Bathroom Toilet: Standard Discharge Bathroom Accessibility: Yes How Accessible: Accessible via walker Does the patient have any problems obtaining your medications?: No  Taj is aware that his Dad will need 24/7 supervisision at d/c as well as need more assist than he did pta such as cooking  Social/Family/Support Systems Patient Roles: Parent Contact Information: Denman George, daughter and son, Jack Quarto Anticipated Caregiver: Jack Quarto, son Anticipated Caregiver's Contact Information: see above Ability/Limitations of Caregiver: Jack Quarto can provide 24/7 supervision Caregiver Availability: 24/7 Discharge Plan Discussed with Primary Caregiver: Yes Is Caregiver In Agreement with Plan?: Yes Does Caregiver/Family have Issues with Lodging/Transportation while Pt is in Rehab?: No  Goals/Additional Needs Patient/Family Goal for Rehab: supervision with PT, OT, and SLP Expected length of stay: [facility] 10-14 days Dietary Needs: Dysphagia 2 diet with thin liquids Pt/Family Agrees to Admission and willing to participate: Yes Program Orientation Provided & Reviewed with Pt/Caregiver Including Roles & Responsibilities: Yes   Decrease burden of Care through IP rehab admission: n/a  Possible need for SNF placement upon discharge:not anticipated  Patient  Condition: This patient's medical and functional status has changed since the consult dated: 02/25/2014 in which the Rehabilitation Physician determined and documented that the patient's condition is appropriate for intensive rehabilitative care in an inpatient rehabilitation facility. See "History of Present Illness" (above) for medical update. Functional changes are: overall mod assist. Patient's medical and functional status update has been discussed with the Rehabilitation physician and patient remains appropriate for inpatient rehabilitation. Will admit to inpatient rehab today.  Preadmission Screen Completed By: [name], 03/03/2014 11:44 AM ______________________________________________________________________  Discussed status with Dr. Naaman Plummer on 03/03/2014 at 1144 and received telephone approval for admission today.  Admission Coordinator: [name], time [id number] Date 03/03/2014          Cosigned by: Meredith Staggers, MD at 03/03/2014 12:55 PM  Revision History     Date/Time User Provider Type Action   03/03/2014 12:55 PM Meredith Staggers, MD Physician Cosign   03/03/2014 11:44 AM [name], RN Rehab Admission Coordinator Sign

## 2014-03-04 ENCOUNTER — Inpatient Hospital Stay (HOSPITAL_COMMUNITY): Payer: Medicare Other | Admitting: *Deleted

## 2014-03-04 ENCOUNTER — Inpatient Hospital Stay (HOSPITAL_COMMUNITY): Payer: Medicare Other | Admitting: Occupational Therapy

## 2014-03-04 ENCOUNTER — Inpatient Hospital Stay (HOSPITAL_COMMUNITY): Payer: Medicare Other | Admitting: Speech Pathology

## 2014-03-04 DIAGNOSIS — I609 Nontraumatic subarachnoid hemorrhage, unspecified: Secondary | ICD-10-CM

## 2014-03-04 DIAGNOSIS — I10 Essential (primary) hypertension: Secondary | ICD-10-CM

## 2014-03-04 DIAGNOSIS — G8190 Hemiplegia, unspecified affecting unspecified side: Secondary | ICD-10-CM

## 2014-03-04 DIAGNOSIS — G8194 Hemiplegia, unspecified affecting left nondominant side: Secondary | ICD-10-CM

## 2014-03-04 LAB — COMPREHENSIVE METABOLIC PANEL
ALT: 22 U/L (ref 0–53)
AST: 25 U/L (ref 0–37)
Albumin: 3.1 g/dL — ABNORMAL LOW (ref 3.5–5.2)
Alkaline Phosphatase: 66 U/L (ref 39–117)
Anion gap: 5 (ref 5–15)
BUN: 22 mg/dL (ref 6–23)
CHLORIDE: 105 meq/L (ref 96–112)
CO2: 21 mmol/L (ref 19–32)
Calcium: 8.7 mg/dL (ref 8.4–10.5)
Creatinine, Ser: 1.01 mg/dL (ref 0.50–1.35)
GFR calc Af Amer: 87 mL/min — ABNORMAL LOW (ref >90)
GFR, EST NON AFRICAN AMERICAN: 75 mL/min — AB (ref >90)
GLUCOSE: 88 mg/dL (ref 70–99)
POTASSIUM: 4.3 mmol/L (ref 3.5–5.1)
SODIUM: 131 mmol/L — AB (ref 135–145)
TOTAL PROTEIN: 7.6 g/dL (ref 6.0–8.3)
Total Bilirubin: 0.3 mg/dL (ref 0.3–1.2)

## 2014-03-04 LAB — CBC WITH DIFFERENTIAL/PLATELET
BASOS ABS: 0.1 10*3/uL (ref 0.0–0.1)
Basophils Relative: 1 % (ref 0–1)
Eosinophils Absolute: 0.3 10*3/uL (ref 0.0–0.7)
Eosinophils Relative: 4 % (ref 0–5)
HEMATOCRIT: 36.3 % — AB (ref 39.0–52.0)
HEMOGLOBIN: 11.5 g/dL — AB (ref 13.0–17.0)
LYMPHS PCT: 38 % (ref 12–46)
Lymphs Abs: 3.2 10*3/uL (ref 0.7–4.0)
MCH: 29.2 pg (ref 26.0–34.0)
MCHC: 31.7 g/dL (ref 30.0–36.0)
MCV: 92.1 fL (ref 78.0–100.0)
Monocytes Absolute: 0.9 10*3/uL (ref 0.1–1.0)
Monocytes Relative: 11 % (ref 3–12)
NEUTROS ABS: 4 10*3/uL (ref 1.7–7.7)
Neutrophils Relative %: 48 % (ref 43–77)
Platelets: 276 10*3/uL (ref 150–400)
RBC: 3.94 MIL/uL — ABNORMAL LOW (ref 4.22–5.81)
RDW: 15.3 % (ref 11.5–15.5)
WBC: 8.5 10*3/uL (ref 4.0–10.5)

## 2014-03-04 MED ORDER — CETYLPYRIDINIUM CHLORIDE 0.05 % MT LIQD
7.0000 mL | Freq: Two times a day (BID) | OROMUCOSAL | Status: DC
  Administered 2014-03-04 – 2014-03-19 (×19): 7 mL via OROMUCOSAL

## 2014-03-04 NOTE — Evaluation (Signed)
Physical Therapy Assessment and Plan  Patient Details  Name: Brian [name] MRN: [id number] Date of Birth: 27-Aug-1945  PT Diagnosis: Abnormal posture, Abnormality of gait, Ataxia, Cognitive deficits and Hemiparesis non-dominant Rehab Potential: Good ELOS: 14-6 days   Today's Date: 03/04/2014 PT Individual Time: 0900-1003 PT Individual Time Calculation (min): 63 min    Problem List:  Patient Active Problem List   Diagnosis Date Noted  . Acute left hemiparesis 03/04/2014  . Subarachnoid hemorrhage 03/03/2014  . Subarachnoid hemorrhage due to ruptured aneurysm 02/20/2014  . Subarachnoid hematoma 02/20/2014  . Abdominal  pain, other specified site 09/29/2011  . C. difficile colitis 05/24/2011  . Hypercoagulable state 05/24/2011  . Anemia 05/20/2011  . Long term current use of anticoagulant 05/20/2011  . Abnormal gallbladder ultrasound 05/20/2011  . Sacral decubitus ulcer, stage II 05/20/2011  . Malnutrition, calorie 05/20/2011  . H/O: CVA (cardiovascular accident) 04/20/2011  . Vasculitis 12/24/2010  . Renal failure, acute 12/24/2010  . Colitis 12/16/2010  . Essential hypertension 12/16/2010  . GERD (gastroesophageal reflux disease) 12/16/2010  . Nausea and vomiting 12/13/2010  . Abdominal pain, acute, epigastric 12/13/2010  . Leukocytosis 12/13/2010    Past Medical History:  Past Medical History  Diagnosis Date  . Hypertension   . CVA (cerebral infarction)   . 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 Renal disorder   . Sepsis(995.91)   . Vascular insufficiency   . Stroke    Past Surgical History:  Past Surgical History  Procedure Laterality Date  . Back surgery  2002/2009  . Laparotomy  12/18/2010    Procedure: EXPLORATORY LAPAROTOMY;  Surgeon: Donato Heinz;  Location: [facility];  Service: General;  Laterality: N/A;  . Partial colectomy  12/18/2010    Procedure: PARTIAL COLECTOMY;  Surgeon: Donato Heinz;  Location: [facility];  Service: General;  Laterality: N/A;  .  Radiology with anesthesia N/A 02/20/2014    Procedure: RADIOLOGY WITH ANESTHESIA;  Surgeon: [name], MD;  Location: [place];  Service: Radiology;  Laterality: N/A;    Assessment & Plan Clinical Impression:[facility]ed to [facility]/19/2014 with headache and neck pain. Blood pressure 161/113. INR upon admission of 1.48. 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 Patient was transferred to [facility] for ongoing evaluation. 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 Kathyrn Sheriff 02/20/2014. Patient remains Nimotop monitoring for any vasospasms. MRSA PCR screening positive maintained on contact precautions. He is on a dysphagia 2 thin liquid diet. Reported to be impulsive with poor safety awareness.   Patient transferred to CIR on 03/03/2014 .   Patient currently requires mod with mobility secondary to impaired timing and sequencing, unbalanced muscle activation, motor apraxia, ataxia, decreased coordination and decreased motor planning, decreased visual perceptual skills, decreased visual motor skills and field cut, decreased attention to left, decreased attention, decreased awareness, decreased problem solving and decreased safety awareness and central origin, to be further assessed. Prior to hospitalization, patient was modified independent  with mobility and lived with Son in a House home; PCA 2-3/day for homemaking.  Home access is 1 to 2Stairs to enter.  Patient will benefit from skilled PT intervention to maximize safe functional mobility, minimize fall risk and decrease caregiver burden for planned discharge home with 24 hour supervision.  Anticipate patient will benefit from follow up [place] at discharge.  PT - End of Session Activity Tolerance: Tolerates <  10 min activity, no significant change in vital signs Endurance Deficit: Yes Endurance  Deficit Description: Pt tolerated 2 min seated LE activity before needing rest break PT Assessment Rehab Potential (ACUTE/IP ONLY): Good Barriers to Discharge: Decreased caregiver support;Inaccessible home environment PT Patient demonstrates impairments in the following area(s): Balance;Endurance;Motor;Perception;Safety;Sensory PT Transfers Functional Problem(s): Bed Mobility;Bed to Chair;Car;Furniture;Floor PT Locomotion Functional Problem(s): Ambulation;Wheelchair Mobility;Stairs PT Plan PT Intensity: Minimum of 1-2 x/day ,45 to 90 minutes PT Frequency: 5 out of 7 days PT Duration Estimated Length of Stay: 14-6 days PT Treatment/Interventions: Ambulation/gait training;Balance/vestibular training;Cognitive remediation/compensation;Discharge planning;Disease management/prevention;DME/adaptive equipment instruction;Functional mobility training;Neuromuscular re-education;Pain management;Patient/family education;Psychosocial support;Skin care/wound management;Splinting/orthotics;Stair training;Therapeutic Activities;Therapeutic Exercise;UE/LE Strength taining/ROM;UE/LE Coordination activities;Visual/perceptual remediation/compensation;Wheelchair propulsion/positioning PT Transfers Anticipated Outcome(s): Mod I  PT Locomotion Anticipated Outcome(s): Supervision PT Recommendation Recommendations for Other Services: Vestibular eval Follow Up Recommendations: Home health PT;24 hour supervision/assistance Patient destination: Home Equipment Recommended: To be determined Equipment Details: Pt may already have all equipment needed  Skilled Therapeutic Intervention Skilled PT tx focused on orientation to PT POC, principles of rehab and discussing goals. NMR focused on weight shifting, L-sided attention tasks, LLE motor control, and manual facilitation during gait with RW. Performed Kinetron x27mn seated with focus on weight shifting and LLE ext. Pt required mulitple cues for attention to task as well as  to L body and environement during all transfers and mobility. Instructed pt in gait with RW x40' with Max A for balance, RW management, and weight shifting. Pt continues to demonstrate significant scissoring bilaterally with max cues needed for step width. Trialed PLF AFO, which did not seem to help signficaintly. Will continue to assess need for AFO for mobility.   PT Evaluation Precautions/Restrictions Precautions Precautions: Fall Precaution Comments: L hemi Restrictions Weight Bearing Restrictions: No General   Vital SignsTherapy Vitals Pulse Rate: 93 BP: 108/84 mmHg Patient Position (if appropriate): Sitting Pain Pain Assessment Pain Assessment: No/denies pain Home Living/Prior Functioning Home Living Available Help at Discharge: Family;Available 24 hours/day Type of Home: House Home Access: Stairs to enter [employer] Steps: 1 to 2 Entrance Stairs-Rails: None Home Layout: One level  Lives With: Son Prior Function Level of Independence: Needs assistance with homemaking;Independent with basic ADLs;Requires assistive device for independence;Needs assistance with gait  Able to Take Stairs?: Yes Driving: No Vision/Perception  Vision - History Baseline Vision: Wears glasses only for reading Patient Visual Report: Unable to keep objects in focus;Peripheral vision impairment Vision - Assessment Eye Alignment: Impaired (comment) Vision Assessment: Vision tested Ocular Range of Motion: Restricted on the left Alignment/Gaze Preference: Head turned;Gaze right Tracking/Visual Pursuits: Left eye does not track laterally;Unable to hold eye position out of midline;Decreased smoothness of eye movement to RIGHT superior field;Decreased smoothness of eye movement to LEFT superior field;Decreased smoothness of eye movement to RIGHT inferior field;Decreased smoothness of eye movement to LEFT inferior field;Decreased smoothness of horizontal tracking;Decreased smoothness of  vertical tracking Saccades: Additional eye shifts occurred during testing;Additional head turns occurred during testing;Decreased speed of saccadic movement Convergence: Impaired (comment) (Minimal convergence noted) Additional Comments: PT has difficulty identifying visual defecits; decreased awareness of deficits in general Perception Perception: Impaired Inattention/Neglect: Does not attend to left visual field;Does not attend to left side of body Praxis Praxis: Impaired Praxis Impairment Details: Motor planning Praxis-Other Comments: Mildly impaired praxis for functional tasks  Cognition Overall Cognitive Status: Impaired/Different from baseline Arousal/Alertness: Awake/alert Orientation Level: Oriented X4 Attention: Sustained Sustained Attention: Impaired Sustained Attention Impairment: Functional basic Memory: Impaired Memory Impairment: Decreased recall of new information Awareness: Impaired  Awareness Impairment: Intellectual impairment;Anticipatory impairment;Emergent impairment Problem Solving: Impaired Problem Solving Impairment: Functional basic Executive Function: Organizing;Sequencing Sequencing: Impaired Sequencing Impairment: Functional basic Organizing: Impaired Organizing Impairment: Functional basic Safety/Judgment: Impaired Comments: Decreased awareness of deficits. Sensation Sensation Light Touch: Impaired Detail (Pt able to identify but limted by inattention to L body) Light Touch Impaired Details: Impaired LUE;Impaired LLE Stereognosis: Appears Intact Hot/Cold: Appears Intact Proprioception: Appears Intact Coordination Gross Motor Movements are Fluid and Coordinated: No Fine Motor Movements are Fluid and Coordinated: No Coordination and Movement Description: limited by neuromotor weakness; scissoring in LLE with ambulation Finger Nose Finger Test: delayed in LUE with overshooting Heel Shin Test: Decreased accuracy and excursion with L on R Motor   Motor Motor: Ataxia;Abnormal postural alignment and control;Motor apraxia;Motor impersistence;Hemiplegia Motor - Skilled Clinical Observations: Acute on chronic L hemi, bil hip adduction/scissoring in standing/gait; trunk curvature to the left with posterior pelvic tilt  Mobility Bed Mobility Bed Mobility: Sit to Supine Sit to Supine: 5: Supervision Sit to Supine - Details (indicate cue type and reason): S cues for safety only Transfers Transfers: Yes Squat Pivot Transfers: 3: Mod assist Squat Pivot Transfer Details (indicate cue type and reason): Pt needs assist to safely complete turn and control descent, cues for safe LE placement and technique Locomotion  Ambulation Ambulation: Yes Ambulation/Gait Assistance: 1: +2 Total assist Ambulation Distance (Feet): 15 Feet Assistive device: 2 person hand held assist Ambulation/Gait Assistance Details: +2 HHA gait with cues for sequence, posture, and increasing step width Stairs / Additional Locomotion Stairs: Yes Stairs Assistance: 3: Mod assist Stairs Assistance Details (indicate cue type and reason): Pt needed max sequence cues, marked by impulsivity. Step-to pattern with Mod A for balance with bil rails Stair Management Technique: Two rails;Step to pattern;Forwards Number of Stairs: 5 Height of Stairs: 6 Wheelchair Mobility Wheelchair Mobility: Yes Wheelchair Assistance: 4: Energy manager: Both upper extremities Wheelchair Parts Management: Needs assistance Distance: 20  Trunk/Postural Assessment  Cervical Assessment Cervical Assessment: Within Scientist, physiological Assessment: Exceptions to [facility] (mild kyphosis in sitting) Lumbar Assessment Lumbar Assessment: Exceptions to [facility] (posterior tilt) Postural Control Postural Control:  ( trunk curvature to the left with posterior pelvic tilt)  Balance Balance Balance Assessed: Yes Static Sitting Balance Static Sitting - Balance Support:  No upper extremity supported Static Sitting - Level of Assistance: 5: Stand by assistance Dynamic Sitting Balance Dynamic Sitting - Balance Support: Feet supported;Bilateral upper extremity supported Dynamic Sitting - Level of Assistance: 3: Mod assist Static Standing Balance Static Standing - Balance Support: During functional activity;Bilateral upper extremity supported Static Standing - Level of Assistance: 4: Min assist Static Standing - Comment/# of Minutes: 15mn  Dynamic Standing Balance Dynamic Standing - Balance Support: Left upper extremity supported;Right upper extremity supported;During functional activity Dynamic Standing - Level of Assistance: 3: Mod assist Extremity Assessment  RUE Assessment RUE Assessment: Within Functional Limits LUE Assessment LUE Assessment: Exceptions to WFL LUE AROM (degrees) Left Shoulder Flexion: 90 Degrees Left Shoulder ABduction: 90 Degrees Left Wrist Extension: 20 Degrees Left Composite Finger Extension: 50% Left Composite Finger Flexion: 75% LUE Strength Left Shoulder Flexion: 3-/5 Left Shoulder ABduction: 3-/5 Left Elbow Flexion: 3+/5 Left Elbow Extension: 3/5 Left Hand Gross Grasp: Impaired (3-/5 grasp strength) RLE Assessment RLE Assessment: Within Functional Limits LLE Assessment LLE Assessment: Exceptions to WFL LLE Strength LLE Overall Strength Comments: 2+/5 throughout except 1/5 ankle DF  FIM:  FIM - Bed/Chair Transfer Bed/Chair Transfer: 4: Supine > Sit: Min A (steadying Pt. >  75%/lift 1 leg);3: Bed > Chair or W/C: Mod A (lift or lower assist) FIM - Locomotion: Wheelchair Distance: 20 FIM - Locomotion: Ambulation Ambulation/Gait Assistance: 1: +2 Total assist   Refer to Care Plan for Long Term Goals  Recommendations for other services: Other: Vestibular eval  Discharge Criteria: Patient will be discharged from PT if patient refuses treatment 3 consecutive times without medical reason, if treatment goals not met, if  there is a change in medical status, if patient makes no progress towards goals or if patient is discharged from hospital.  The above assessment, treatment plan, treatment alternatives and goals were discussed and mutually agreed upon: by patient   Kennieth Rad, PT, DPT  03/04/2014, 12:39 PM

## 2014-03-04 NOTE — Progress Notes (Signed)
Patient information reviewed and entered into eRehab system by Clois Treanor, RN, CRRN, PPS Coordinator.  Information including medical coding and functional independence measure will be reviewed and updated through discharge.     Per nursing patient was given "Data Collection Information Summary for Patients in Inpatient Rehabilitation Facilities with attached "Privacy Act Statement-Health Care Records" upon admission.  

## 2014-03-04 NOTE — Progress Notes (Signed)
Social Work Assessment and Plan Social Work Assessment and Plan  Patient Details  Name: Brian [name] MRN: [id number] Date of Birth: [birth year]  Today's Date: 03/04/2014  Problem List:  Patient Active Problem List   Diagnosis Date Noted  . Acute left hemiparesis 03/04/2014  . Subarachnoid hemorrhage 03/03/2014  . Subarachnoid hemorrhage due to ruptured aneurysm 02/20/2014  . Subarachnoid hematoma 02/20/2014  . Abdominal  pain, other specified site 09/29/2011  . C. difficile colitis 05/24/2011  . Hypercoagulable state 05/24/2011  . Anemia 05/20/2011  . Long term current use of anticoagulant 05/20/2011  . Abnormal gallbladder ultrasound 05/20/2011  . Sacral decubitus ulcer, stage II 05/20/2011  . Malnutrition, calorie 05/20/2011  . H/O: CVA (cardiovascular accident) 04/20/2011  . Vasculitis 12/24/2010  . Renal failure, acute 12/24/2010  . Colitis 12/16/2010  . Essential hypertension 12/16/2010  . GERD (gastroesophageal reflux disease) 12/16/2010  . Nausea and vomiting 12/13/2010  . Abdominal pain, acute, epigastric 12/13/2010  . Leukocytosis 12/13/2010   Past Medical History:  Past Medical History  Diagnosis Date  . Hypertension   . CVA (cerebral infarction)   . 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 Renal disorder   . Sepsis(995.91)   . Vascular insufficiency   . Stroke    Past Surgical History:  Past Surgical History  Procedure Laterality Date  . Back surgery  2002/2009  . Laparotomy  12/18/2010    Procedure: EXPLORATORY LAPAROTOMY;  Surgeon: Donato Heinz;  Location: [facility];  Service: General;  Laterality: N/A;  . Partial colectomy  12/18/2010    Procedure: PARTIAL COLECTOMY;  Surgeon: Donato Heinz;  Location: [facility];  Service: General;  Laterality: N/A;  . Radiology with anesthesia N/A 02/20/2014    Procedure: RADIOLOGY WITH ANESTHESIA;  Surgeon: [name], MD;  Location: [place];  Service: Radiology;  Laterality: N/A;   Social History:  reports that  he has been smoking Cigarettes.  He has a 10 pack-year smoking history. He has never used smokeless tobacco. He reports that he drinks alcohol. He reports that he uses illicit drugs (Marijuana).  Family / Support Systems Marital Status: Widow/Widower Patient Roles: Parent Children: Brian [name]  [phone]   Other Supports: Brian [name]  [phone] Anticipated Caregiver: Taj and PCS aide Ability/Limitations of Caregiver: Brian [name] is there he does not work and pt has Physiological scientist 2 hrs M-F  Caregiver Availability: 24/7 Family Dynamics: Close knit with two children son live with him and daughter lives in [place].  Both are supportive and involved in each other's lifes.  Pt is grateful son was there when this happened so he could get EMS. He feels it could have turned out worse then it did.  Social History Preferred language: English Religion: Christian Cultural Background: No issues Education: High School Read: Yes Write: Yes Employment Status: Disabled Freight forwarder Issues: No issues Guardian/Conservator: None-according to MD pt is capable of making his own decisions while here.   Abuse/Neglect Physical Abuse: Denies Verbal Abuse: Denies Sexual Abuse: Denies Exploitation of patient/patient's resources: Denies Self-Neglect: Denies  Emotional Status Pt's affect, behavior adn adjustment status: Pt is able to explain his surgery and that is doing much better.  He feels he is ready to go home and wants to go as soon as possible.  He feels he knows the set up there and can move aorund better.  He only acknowledges some weakness in his left side, but no balance issues.  He has always been independent and plans to be again Recent  Psychosocial Issues: other health issues-has always managed Pyschiatric History: No history deferred depression screen due to pt feeling he is doing ok and this is not needed.  He wants to return home soon, he is not fully aware of his deficits from  this stroke.  Will ask input from other team members if intervention is needed. Substance Abuse History: Tobacco unsure if plans to quit now or not.  Aware of the health issues associated with smoking.  He reports he sometimes drinks but doens;t think it is a problem.  Patient / Family Perceptions, Expectations & Goals Pt/Family understanding of illness & functional limitations: Pt can explain his surgery and some weakness he has, but does not acknowlegde any other deficits.  He feels he can go home and manage like he is right now. Seems like he is unaware of his deficits and will have safety issues.  Will make sure son will be there with him at home at discharge. Premorbid pt/family roles/activities: father, widow, retiree, friend, etc Anticipated changes in roles/activities/participation: resume Pt/family expectations/goals: Pt states:  " I feel ready to go home now, I know where everything is there."  Daughter states: " I hope he does well here because he won;t go back to a nursing home."  [employer]: Other (Comment) ([facility] M-F 2hrs per day) Premorbid Home Care/DME Agencies: Other (Comment) (had  [facility] in the past) Transportation available at discharge: Physiological scientist or he uses his scooter to get where he needs to go if weather permits Resource referrals recommended: Support group (specify), Neuropsychology  Discharge Planning Living Arrangements: Children Support Systems: Children, Other relatives, Friends/neighbors Type of Residence: Private residence Insurance Resources: Information systems manager, [place] (specify county) [employer]) Museum/gallery curator Resources: [employer] Screen Referred: No Living Expenses: Lives with family Money Management: Other (Comment), Patient (PCS aide pays the bills) Does the patient have any problems obtaining your medications?: No Home Management: Physiological scientist, pt does the cooking which he may need to quit doing now if not safe to  do Patient/Family Preliminary Plans: Pt plans to return home with son who is there and doesn;t work, along with Physiological scientist who assists M-F for 2 hrs.  Pt's daughter is in [place] and does check on both brother and her Dad.  Will see if can increase PCS services and if any other sevices available to them. Pt plans to go hoem no matter what when able to go.  He has been to a NH and does not plan to go back. Social Work Anticipated Follow Up Needs: HH/OP, Support Group  Clinical Impression Pleasant gentleman who is willing to work in therapies but isn't aware of his deficits and will be a safety risk at home especially around the stove.  Will try to get son in for education prior to discharge to make sure he see's Dad's deficits and is aware of the care he requires before discharge.  Will check with PCS to see if can increase pt's hours during the week and place him on the CAP waiting list in [facility].  Will work on a safe discharge plan.  Elease Hashimoto 03/04/2014, 1:38 PM

## 2014-03-04 NOTE — Evaluation (Signed)
Speech Language Pathology Assessment and Plan  Patient Details  Name: Brian [name] MRN: [id number] Date of Birth: April 05, 1945  SLP Diagnosis: Dysphagia;Cognitive Impairments  Rehab Potential: Fair ELOS: 14 days     Today's Date: 03/04/2014 SLP Individual Time: 1300-1400 SLP Individual Time Calculation (min): 60 min   Problem List:  Patient Active Problem List   Diagnosis Date Noted  . Acute left hemiparesis 03/04/2014  . Subarachnoid hemorrhage 03/03/2014  . Subarachnoid hemorrhage due to ruptured aneurysm 02/20/2014  . Subarachnoid hematoma 02/20/2014  . Abdominal  pain, other specified site 09/29/2011  . C. difficile colitis 05/24/2011  . Hypercoagulable state 05/24/2011  . Anemia 05/20/2011  . Long term current use of anticoagulant 05/20/2011  . Abnormal gallbladder ultrasound 05/20/2011  . Sacral decubitus ulcer, stage II 05/20/2011  . Malnutrition, calorie 05/20/2011  . H/O: CVA (cardiovascular accident) 04/20/2011  . Vasculitis 12/24/2010  . Renal failure, acute 12/24/2010  . Colitis 12/16/2010  . Essential hypertension 12/16/2010  . GERD (gastroesophageal reflux disease) 12/16/2010  . Nausea and vomiting 12/13/2010  . Abdominal pain, acute, epigastric 12/13/2010  . Leukocytosis 12/13/2010   Past Medical History:  Past Medical History  Diagnosis Date  . Hypertension   . CVA (cerebral infarction)   . 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 Renal disorder   . Sepsis(995.91)   . Vascular insufficiency   . Stroke    Past Surgical History:  Past Surgical History  Procedure Laterality Date  . Back surgery  2002/2009  . Laparotomy  12/18/2010    Procedure: EXPLORATORY LAPAROTOMY;  Surgeon: Donato Heinz;  Location: [facility];  Service: General;  Laterality: N/A;  . Partial colectomy  12/18/2010    Procedure: PARTIAL COLECTOMY;  Surgeon: Donato Heinz;  Location: [facility];  Service: General;  Laterality: N/A;  . Radiology with anesthesia N/A 02/20/2014     Procedure: RADIOLOGY WITH ANESTHESIA;  Surgeon: [name], MD;  Location: [place];  Service: Radiology;  Laterality: N/A;    Assessment / Plan / Recommendation Clinical Impression   Brian [name] is a 69 y.o. right handed male with history of tobacco abuse, hypertension,? CVA 2 as well as hypercoagulable state maintained on chronic Coumadin. As reported patient had recently ran out of his medication for the past week. He lives with his son used a walker prior to admission and has a home health aide 2-3 hours per day. [facility]ed to [facility]/19/2014 with headache and neck pain. Blood pressure 161/113. 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 Kathyrn Sheriff 02/20/2014. He is on a dysphagia 2 thin liquid diet. Reported to be impulsive with poor safety awareness. Patient was admitted for comprehensive rehabilitation program 03/03/2014.  SLP evaluation completed on 03/04/2014 with the following results: Pt presents with s/s of a mild-moderate oral dysphagia characterized by left sided labial, lingual, and buccal weakness.  The abovementioned deficits resulted in prolonged posterior transit of materials with mild residuals left in the oral cavity post swallow.  No overt s/s of aspiration were noted across solid or liquid consistencies; however, pt remains at risk of aspiration without full supervision for use of swallowing precautions due to cognitive deficits.  Therefore, recommend that pt remain on dys 2 solids with thin liquids; meds whole in puree.   Furthermore, pt presents with moderately-severe cognitive deficits characterized by  decreased sustained attention and decreased intellectual awareness of deficits which impacts all higher level cognitive processes such as memory and functional problem solving.  Pt reports that he  was managing his own medications at home.  While no family was present to verify baseline cognitive function, suspect that pt is a poor historian and had baseline cognitive impairments due to history of multiple previous strokes.  Do not recommend that pt resume managing his own medications or finances.  Pt would benefit from skilled ST follow up while inpatient in order to maximize functional independence and reduce burden of care prior to discharge.  Anticipate that pt will require 24/7 supervision at discharge and potential ST follow up in either the home health or outpatient setting.      Skilled Therapeutic Interventions          Cognitive-linguistic and bedside swallowing  evaluation completed with results and recommendations reviewed with patient .     SLP Assessment  Patient will need skilled Speech Lanaguage Pathology Services during CIR admission    Recommendations  Diet Recommendations: Dysphagia 2 (Fine chop);Thin liquid Liquid Administration via: Cup;Straw Medication Administration: Whole meds with liquid Supervision: Patient able to self feed;Full supervision/cueing for compensatory strategies Compensations: Slow rate;Small sips/bites;Check for pocketing Postural Changes and/or Swallow Maneuvers: Seated upright 90 degrees Oral Care Recommendations: Oral care BID Patient destination: Home Follow up Recommendations: Home Health SLP;24 hour supervision/assistance Equipment Recommended: None recommended by SLP    SLP Frequency 5 out of 7 days   SLP Treatment/Interventions Cognitive remediation/compensation;Cueing hierarchy;Dysphagia/aspiration precaution training;Functional tasks;Patient/family education;Internal/external aids;Environmental controls    Pain Pain Assessment Pain Assessment: 0-10 Pain Score: 7  Pain Type: Acute pain Pain Location: Head Pain Descriptors / Indicators: Aching Pain Onset: Gradual Pain Intervention(s): RN made aware Prior  Functioning Cognitive/Linguistic Baseline: Baseline deficits Baseline deficit details: suspected pt with baseline deficits due to history of multiple previous strokes  Type of Home: House  Lives With: Son Available Help at Discharge: Family;Available 24 hours/day Education: 9th grade  Vocation: Retired  Industrial/product designer Term Goals: Week 1: SLP Short Term Goal 1 (Week 1): Pt will tolerate presentations of his currently prescribed diet with no overt s/s of aspiration and supervision cues for use of swallowing precautions.  SLP Short Term Goal 2 (Week 1): Pt will improve mastication of dys 3 textures over 2-3 consecutive sessions with min-supervision cues to monitor left pocketing  SLP Short Term Goal 3 (Week 1): Pt will improve basic functional problem solving over 75% of observable opportunities with mod assist multimodal cues.  SLP Short Term Goal 4 (Week 1): Pt will improve recall of basic, daily information for 75% accuracy with mod assist multimodal cues for use of external aids.  SLP Short Term Goal 5 (Week 1): Pt will sustain his attention during functional tasks for 5-7 minutes with min-mod cues for redirection.   See FIM for current functional status Refer to Care Plan for Long Term Goals  Recommendations for other services: None  Discharge Criteria: Patient will be discharged from SLP if patient refuses treatment 3 consecutive times without medical reason, if treatment goals not met, if there is a change in medical status, if patient makes no progress towards goals or if patient is discharged from hospital.  The above assessment, treatment plan, treatment alternatives and goals were discussed and mutually agreed upon: by patient  Emilio Math 03/04/2014, 4:26 PM

## 2014-03-04 NOTE — Plan of Care (Signed)
Problem: RH BLADDER ELIMINATION Goal: RH STG MANAGE BLADDER WITH ASSISTANCE STG Manage Bladder With Assistance  Outcome: Not Progressing Pt had incont. Episode after asleep.Condom cath. Applied after complete bed change.

## 2014-03-04 NOTE — H&P (Addendum)
69 y.o. right handed male with history of tobacco abuse, hypertension,? CVA 2 as well as hypercoagulable state maintained on chronic Coumadin. As reported patient had recently ran out of his medication for the past week. He lives with his son used a walker prior to admission and has a home health aide 2-3 hours per day. Presented to [facility] 02/19/2014 with headache and neck pain. Blood pressure 161/113. INR upon admission of 1.48. 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 Patient was transferred to [facility] for ongoing evaluation. 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 Nundkumar 02/20/2014. Patient remains Nimotop monitoring for any vasospasms  This note incorrectly labeled as History and Physical  Subjective/Complaints: RN notes poor oral hygiene Pt states "  I slept like a drunk man last noc" Review of Systems - Negative except left side weak  Objective: Vital Signs: Blood pressure 120/86, pulse 88, temperature 97.9 F (36.6 C), temperature source Oral, resp. rate 18, height 5' 7" (1.702 m), weight 65.137 kg (143 lb 9.6 oz), SpO2 98 %. No results found. Results for orders placed or performed during the hospital encounter of 03/03/14 (from the past 72 hour(s))  CBC WITH DIFFERENTIAL     Status: Abnormal   Collection Time: 03/04/14  6:23 AM  Result Value Ref Range   WBC 8.5 4.0 - 10.5 K/uL   RBC 3.94 (L) 4.22 - 5.81 MIL/uL   Hemoglobin 11.5 (L) 13.0 - 17.0 g/dL   HCT 36.3 (L) 39.0 - 52.0 %   MCV 92.1 78.0 - 100.0 fL   MCH 29.2 26.0 - 34.0 pg   MCHC 31.7 30.0 - 36.0 g/dL   RDW 15.3 11.5 - 15.5 %   Platelets 276 150 - 400 K/uL   Neutrophils Relative % 48 43 - 77 %   Neutro Abs 4.0 1.7 - 7.7 K/uL   Lymphocytes Relative 38 12 - 46 %   Lymphs Abs 3.2 0.7 - 4.0 K/uL   Monocytes Relative 11 3 - 12 %   Monocytes Absolute 0.9 0.1 - 1.0  K/uL   Eosinophils Relative 4 0 - 5 %   Eosinophils Absolute 0.3 0.0 - 0.7 K/uL   Basophils Relative 1 0 - 1 %   Basophils Absolute 0.1 0.0 - 0.1 K/uL  Comprehensive metabolic panel     Status: Abnormal   Collection Time: 03/04/14  6:23 AM  Result Value Ref Range   Sodium 131 (L) 135 - 145 mmol/L    Comment: Please note change in reference range.   Potassium 4.3 3.5 - 5.1 mmol/L    Comment: Please note change in reference range.   Chloride 105 96 - 112 mEq/L   CO2 21 19 - 32 mmol/L   Glucose, Bld 88 70 - 99 mg/dL   BUN 22 6 - 23 mg/dL   Creatinine, Ser 1.01 0.50 - 1.35 mg/dL   Calcium 8.7 8.4 - 10.5 mg/dL   Total Protein 7.6 6.0 - 8.3 g/dL   Albumin 3.1 (L) 3.5 - 5.2 g/dL   AST 25 0 - 37 U/L   ALT 22 0 - 53 U/L   Alkaline Phosphatase 66 39 - 117 U/L   Total Bilirubin 0.3 0.3 - 1.2 mg/dL   GFR calc non Af Amer 75 (L) >90 mL/min   GFR calc Af Amer 87 (L) >90 mL/min    Comment: (NOTE)   The eGFR has been calculated using the CKD EPI equation. This calculation has not been validated in all clinical situations. eGFR's persistently <90 mL/min signify possible Chronic Kidney Disease.    Anion gap 5 5 - 15     HEENT: mucosal irritation Cardio: RRR and no murmur Resp: CTA B/L and unlabored GI: BS positive and NT Extremity:  Pulses positive and No Edema Skin:   Intact Neuro: Alert/Oriented, Cranial Nerve II-XII normal, Abnormal Sensory Reduced Left L5 dermatome, Abnormal Motor 3-/r Left bi, tri, grip, delt, 4/4 Left HF, KE, ADF and Abnormal FMC Ataxic/ dec FMC Musc/Skel:  Other no pain with ROM Gen NAD   Assessment/Plan: 1. Functional deficits secondary to Right ICA aneurysm rupture with Left hemiparesis which require 3+ hours per day of interdisciplinary therapy in a comprehensive inpatient rehab setting. Physiatrist is providing close team supervision and 24 hour management of active medical problems listed below. Physiatrist and rehab team continue to assess barriers to  discharge/monitor patient progress toward functional and medical goals. FIM:          FIM - Toilet Transfers Toilet Transfers Assistive Devices: Bedside commode Toilet Transfers: 3-To toilet/BSC: Mod A (lift or lower assist), 3-From toilet/BSC: Mod A (lift or lower assist)        Comprehension Comprehension Mode: Auditory Comprehension: 3-Understands basic 50 - 74% of the time/requires cueing 25 - 50%  of the time  Expression Expression Mode: Verbal Expression: 4-Expresses basic 75 - 89% of the time/requires cueing 10 - 24% of the time. Needs helper to occlude trach/needs to repeat words.  Social Interaction Social Interaction: 4-Interacts appropriately 75 - 89% of the time - Needs redirection for appropriate language or to initiate interaction.  Problem Solving Problem Solving: 2-Solves basic 25 - 49% of the time - needs direction more than half the time to initiate, plan or complete simple activities  Memory Memory: 3-Recognizes or recalls 50 - 74% of the time/requires cueing 25 - 49% of the time  Medical Problem List and Plan: 1. 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  DVT Prophylaxis/Anticoagulation: SCDs. Monitor for any signs of DVT 3. Pain Management: Tylenol as needed 4. History of CVA 2 with hypercoagulable state. Coumadin on hold due to subarachnoid hemorrhage. 5. Neuropsych: This patient is capable of making decisions on his own behalf. 6. Skin/Wound Care: Routine skin checks 7. Fluids/Electrolytes/Nutrition: Strict I and O follow-up chemistries 8. Hypertension. Nimotop protocol to end  03/13/2014. Monitor with increased mobility 9. Dysphagia. Dysphagia 2 thin liquid diet. Monitor for any aspiration. Follow-up speech therapy 10. MRSA PCR screening positive. Contact precautions 11. Thrus: nystatin swish and swallow   LOS (Days) 1 A FACE TO FACE EVALUATION WAS  PERFORMED  KIRSTEINS,ANDREW E 03/04/2014, 8:34 AM    

## 2014-03-04 NOTE — Care Management Note (Signed)
Inpatient [facility] Individual Statement of Services  Patient Name:  Brian [name]  Date:  03/04/2014  Welcome to the [facility].  Our goal is to provide you with an individualized program based on your diagnosis and situation, designed to meet your specific needs.  With this comprehensive rehabilitation program, you will be expected to participate in at least 3 hours of rehabilitation therapies Monday-Friday, with modified therapy programming on the weekends.  Your rehabilitation program will include the following services:  Physical Therapy (PT), Occupational Therapy (OT), Speech Therapy (ST), 24 hour per day rehabilitation nursing, Therapeutic Recreaction (TR), Case Management (Social Worker), Rehabilitation Medicine, Nutrition Services and Pharmacy Services  Weekly team conferences will be held on Wednesday to discuss your progress.  Your Social Worker will talk with you frequently to get your input and to update you on team discussions.  Team conferences with you and your family in attendance may also be held.  Expected length of stay: 12-16 days  Overall anticipated outcome: supervision-min level  Depending on your progress and recovery, your program may change. Your Social Worker will coordinate services and will keep you informed of any changes. Your Social Worker's name and contact numbers are listed  below.  The following services may also be recommended but are not provided by the [facility]:    [place] will be made to provide these services after discharge if needed.  Arrangements include referral to agencies that provide these services.  Your insurance has been verified to be:  Medicare & medicaid Your primary doctor is:  [facility]keeper  Pertinent information will be shared with your doctor and your insurance company.  Social Worker:  Ovidio Kin, [place] or [phone]  Information discussed with and copy given to patient by: Elease Hashimoto, 03/04/2014, 11:17 AM

## 2014-03-04 NOTE — Evaluation (Signed)
Occupational Therapy Assessment and Plan  Patient Details  Name: Brian [name] MRN: [id number] Date of Birth: 1945-11-24  OT Diagnosis: abnormal posture, apraxia, cognitive deficits, disturbance of vision and hemiplegia affecting non-dominant side Rehab Potential: Rehab Potential (ACUTE ONLY): Good ELOS: 12-14 days   Today's Date: 03/04/2014 OT Individual Time: 0800-0900 OT Individual Time Calculation (min): 60 min     Problem List:  Patient Active Problem List   Diagnosis Date Noted  . Acute left hemiparesis 03/04/2014  . Subarachnoid hemorrhage 03/03/2014  . Subarachnoid hemorrhage due to ruptured aneurysm 02/20/2014  . Subarachnoid hematoma 02/20/2014  . Abdominal  pain, other specified site 09/29/2011  . C. difficile colitis 05/24/2011  . Hypercoagulable state 05/24/2011  . Anemia 05/20/2011  . Long term current use of anticoagulant 05/20/2011  . Abnormal gallbladder ultrasound 05/20/2011  . Sacral decubitus ulcer, stage II 05/20/2011  . Malnutrition, calorie 05/20/2011  . H/O: CVA (cardiovascular accident) 04/20/2011  . Vasculitis 12/24/2010  . Renal failure, acute 12/24/2010  . Colitis 12/16/2010  . Essential hypertension 12/16/2010  . GERD (gastroesophageal reflux disease) 12/16/2010  . Nausea and vomiting 12/13/2010  . Abdominal pain, acute, epigastric 12/13/2010  . Leukocytosis 12/13/2010    Past Medical History:  Past Medical History  Diagnosis Date  . Hypertension   . CVA (cerebral infarction)   . 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 Renal disorder   . Sepsis(995.91)   . Vascular insufficiency   . Stroke    Past Surgical History:  Past Surgical History  Procedure Laterality Date  . Back surgery  2002/2009  . Laparotomy  12/18/2010    Procedure: EXPLORATORY LAPAROTOMY;  Surgeon: Donato Heinz;  Location: [facility];  Service: General;  Laterality: N/A;  . Partial colectomy  12/18/2010    Procedure: PARTIAL COLECTOMY;  Surgeon: Donato Heinz;   Location: [facility];  Service: General;  Laterality: N/A;  . Radiology with anesthesia N/A 02/20/2014    Procedure: RADIOLOGY WITH ANESTHESIA;  Surgeon: [name], MD;  Location: [place];  Service: Radiology;  Laterality: N/A;    Assessment & Plan Clinical Impression:  Brian [name] is a 69 y.o. right handed male with history of tobacco abuse, hypertension,? CVA 2 as well as hypercoagulable state maintained on chronic Coumadin. As reported patient had recently ran out of his medication for the past week. He lives with his son used a walker prior to admission and has a home health aide 2-3 hours per day. Presented to [facility] 02/19/2014 with headache and neck pain. Blood pressure 161/113. INR upon admission of 1.48. 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 Patient was transferred to [facility] for ongoing evaluation. 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 Kathyrn Sheriff 02/20/2014. Patient remains Nimot[facility] monitoring for any vasospasms. MRSA PCR screening positive maintained on contact precautions. He is on a dysphagia 2 thin liquid diet. Reported to be impulsive with poor safety awareness. Physical and occupational therapy evaluations completed 02/25/2014 with recommendations of physical medicine rehabilitation consult. Patient was admitted for comprehensive rehabilitation program   Patient transferred to CIR on 03/03/2014 .    Patient currently requires max with basic self-care skills secondary to motor apraxia and decreased coordination, decreased visual perceptual skills, decreased visual motor skills and field cut, decreased attention to left, decreased attention, decreased awareness, decreased problem solving and decreased memory and decreased sitting  balance, decreased standing balance, decreased postural control and hemiplegia.  Prior to  hospitalization, patient could complete basic self care and cooking with mod I using his cane for support.  Patient will benefit from skilled intervention to increase independence with basic self-care skills prior to discharge home with care partner.  Anticipate patient will require 24 hour supervision and follow up home health.  OT - End of Session Activity Tolerance: Tolerates 30+ min activity with multiple rests OT Assessment Rehab Potential (ACUTE ONLY): Good OT Patient demonstrates impairments in the following area(s): Balance;Cognition;Motor;Perception;Safety;Sensory;Vision OT Basic ADL's Functional Problem(s): Bathing;Dressing;Toileting;Grooming;Eating OT Advanced ADL's Functional Problem(s): Simple Meal Preparation OT Transfers Functional Problem(s): Toilet;Tub/Shower OT Additional Impairment(s): Fuctional Use of Upper Extremity OT Plan OT Intensity: Minimum of 1-2 x/day, 45 to 90 minutes OT Frequency: 5 out of 7 days OT Duration/Estimated Length of Stay: 12-14 days OT Treatment/Interventions: Balance/vestibular training;Cognitive remediation/compensation;Discharge planning;DME/adaptive equipment instruction;Functional electrical stimulation;Functional mobility training;Neuromuscular re-education;Patient/family education;Self Care/advanced ADL retraining;Therapeutic Activities;Therapeutic Exercise;UE/LE Strength taining/ROM;UE/LE Coordination activities;Visual/perceptual remediation/compensation OT Self Feeding Anticipated Outcome(s): mod I OT Basic Self-Care Anticipated Outcome(s): supervision OT Toileting Anticipated Outcome(s): supervision OT Bathroom Transfers Anticipated Outcome(s): supervision OT Recommendation Patient destination: Home Follow Up Recommendations: Home health OT Equipment Recommended: Tub/shower bench   Skilled Therapeutic Intervention Pt seen for initial evaluation and ADL retraining of bathing, dressing, grooming, toilet transfers with a focus on balance,  left side awareness, use of LUE and motor planning. Pt received in bed, he was able to sit up from a flat bed and no rails with min A. Transferred to chair for self care training.  Pt is very talkative and often needs to be redirected. He was able to integrate his left arm into the self care but with cues to attend to it and mod cues due to his apraxia with dressing.  Pt is able to stand up to the sink with slight A but needs increased support when standing to pull pants over his hips.  Pt did use L hand actively to pull pants up. He has several premorbid issues, such as limited shoulder AROM. It is unclear if his postural control and visual motor difficulties are new or from his previous CVAs. Pt is highly motivated and in agreement with the OT POC and goals to reach a S level.  Pt handed off to the next therapist.    OT Evaluation Precautions/Restrictions  Precautions Precautions: Fall Precaution Comments: L hemi Restrictions Weight Bearing Restrictions: No   Vital Signs Therapy Vitals Pulse Rate: 94 BP: 122/84 mmHg Patient Position (if appropriate): Sitting Pain Pain Assessment Pain Assessment: No/denies pain Home Living/Prior Functioning Home Living Available Help at Discharge: Family, Available 24 hours/day Type of Home: House Home Access: Stairs to enter [employer] of Steps: 1 to 2 Home Layout: One level  Lives With: Son Prior Function Level of Independence: Requires assistive device for independence, Independent with homemaking with ambulation, Independent with basic ADLs  Able to Take Stairs?: Yes ADL ADL ADL Comments: Refer to FIM Vision/Perception  Vision- History Baseline Vision/History: No visual deficits Patient Visual Report: No change from baseline Vision- Assessment Vision Assessment?: Yes Eye Alignment: Impaired (comment) Ocular Range of Motion: Restricted on the left Tracking/Visual Pursuits: Left eye does not track laterally;Unable to hold eye  position out of midline;Decreased smoothness of eye movement to RIGHT superior field;Decreased smoothness of eye movement to LEFT superior field;Decreased smoothness of eye movement to RIGHT inferior field;Decreased smoothness of eye movement to LEFT inferior field;Decreased smoothness of horizontal tracking;Decreased smoothness of  vertical tracking Saccades: Additional eye shifts occurred during testing;Additional head turns occurred during testing;Decreased speed of saccadic movement Convergence: Impaired (comment) Visual Fields: Left visual field deficit Additional Comments: Pt declines visual difficulties, decreased awareness of deficits. Praxis Praxis-Other Comments: moderately impaired dressing praxis  Cognition Overall Cognitive Status: Impaired/Different from baseline Orientation Level: Oriented X4 Attention: Sustained Sustained Attention: Impaired Sustained Attention Impairment: Functional basic Memory: Impaired Memory Impairment: Decreased recall of new information Awareness: Impaired Awareness Impairment: Intellectual impairment Problem Solving: Impaired Problem Solving Impairment: Functional basic Executive Function: Organizing;Sequencing Sequencing: Impaired Sequencing Impairment: Functional basic Organizing: Impaired Organizing Impairment: Functional basic Safety/Judgment: Impaired Comments: Decreased awareness of deficits. Sensation Sensation Light Touch: Impaired by gross assessment (LUE and LLE) Stereognosis: Appears Intact Hot/Cold: Appears Intact Proprioception: Impaired by gross assessment (LUE and LLE) Coordination Gross Motor Movements are Fluid and Coordinated: No Fine Motor Movements are Fluid and Coordinated: No Coordination and Movement Description: scissoring in LLE with ambulation Finger Nose Finger Test: delayed in LUE with overshooting Motor  Motor Motor: Hemiplegia;Motor apraxia;Abnormal postural alignment and control Motor - Skilled Clinical  Observations: in static sitting, trunk curvature to the left with posterior pelvic tilt Mobility    min A squat pivot to toilet with grab bars, mod A squat pivot bed >< w/c, max A x2 to ambulate Trunk/Postural Assessment  Thoracic Assessment Thoracic Assessment: Exceptions to [facility] (mild kyphosis in sitting) Lumbar Assessment Lumbar Assessment: Exceptions to [facility] (posterior tilt) Postural Control Postural Control: Deficits on evaluation (in static sitting, trunk curvature to the left with posterior pelvic tilt)  Balance Static Sitting Balance Static Sitting - Level of Assistance: 5: Stand by assistance Dynamic Sitting Balance Dynamic Sitting - Level of Assistance: 3: Mod assist Static Standing Balance Static Standing - Level of Assistance: 4: Min assist Dynamic Standing Balance Dynamic Standing - Level of Assistance: 2: Max assist Extremity/Trunk Assessment RUE Assessment RUE Assessment: Within Functional Limits LUE Assessment LUE Assessment: Exceptions to WFL LUE AROM (degrees) Left Shoulder Flexion: 90 Degrees Left Shoulder ABduction: 90 Degrees Left Wrist Extension: 20 Degrees Left Composite Finger Extension: 50% Left Composite Finger Flexion: 75% LUE Strength Left Shoulder Flexion: 3-/5 Left Shoulder ABduction: 3-/5 Left Elbow Flexion: 3+/5 Left Elbow Extension: 3/5 Left Hand Gross Grasp: Impaired (3-/5 grasp strength)  FIM:  FIM - Grooming Grooming Steps: Wash, rinse, dry face;Wash, rinse, dry hands;Oral care, brush teeth, clean dentures Grooming: 4: Steadying assist  or patient completes 3 of 4 or 4 of 5 steps FIM - Bathing Bathing Steps Patient Completed: Chest;Right Arm;Left Arm;Abdomen;Buttocks;Front perineal area;Right upper leg;Left upper leg Bathing: 4: Min-Patient completes 8-9 [id number] parts or 75+ percent FIM - Upper Body Dressing/Undressing Upper body dressing/undressing steps patient completed: Put head through opening of pull over shirt/dress;Pull shirt over  trunk;Thread/unthread right sleeve of pullover shirt/dresss Upper body dressing/undressing: 4: Min-Patient completed 75 plus % of tasks FIM - Lower Body Dressing/Undressing Lower body dressing/undressing steps patient completed: Pull underwear up/down;Pull pants up/down;Thread/unthread right pants leg Lower body dressing/undressing: 2: Max-Patient completed 25-49% of tasks FIM - Toileting Toileting: 0: Activity did not occur FIM - Bed/Chair Transfer Bed/Chair Transfer: 4: Supine > Sit: Min A (steadying Pt. > 75%/lift 1 leg);3: Bed > Chair or W/C: Mod A (lift or lower assist) FIM - TRadio producerDevices: Grab bars Toilet Transfers: 4-From toilet/BSC: Min A (steadying Pt. > 75%);4-To toilet/BSC: Min A (steadying Pt. > 75%) FIM - Tub/Shower Transfers Tub/shower Transfers: 0-Activity did not occur or was simulated   Refer to Care  Plan for Long Term Goals  Recommendations for other services: None  Discharge Criteria: Patient will be discharged from OT if patient refuses treatment 3 consecutive times without medical reason, if treatment goals not met, if there is a change in medical status, if patient makes no progress towards goals or if patient is discharged from hospital.  The above assessment, treatment plan, treatment alternatives and goals were discussed and mutually agreed upon: by patient  [facility] 03/04/2014, 9:48 AM

## 2014-03-05 ENCOUNTER — Inpatient Hospital Stay (HOSPITAL_COMMUNITY): Payer: Medicare Other | Admitting: Occupational Therapy

## 2014-03-05 ENCOUNTER — Inpatient Hospital Stay (HOSPITAL_COMMUNITY): Payer: Medicare Other | Admitting: Speech Pathology

## 2014-03-05 ENCOUNTER — Inpatient Hospital Stay (HOSPITAL_COMMUNITY): Payer: Medicare Other

## 2014-03-05 MED ORDER — PANTOPRAZOLE SODIUM 40 MG PO PACK
40.0000 mg | PACK | Freq: Every day | ORAL | Status: DC
  Administered 2014-03-05 – 2014-03-18 (×14): 40 mg via ORAL
  Filled 2014-03-05 (×17): qty 20

## 2014-03-05 MED ORDER — TIZANIDINE HCL 2 MG PO TABS
2.0000 mg | ORAL_TABLET | Freq: Three times a day (TID) | ORAL | Status: DC
  Administered 2014-03-05 – 2014-03-19 (×40): 2 mg via ORAL
  Filled 2014-03-05 (×45): qty 1

## 2014-03-05 NOTE — Progress Notes (Signed)
Physical Therapy Session Note  Patient Details  Name: Brian [name] MRN: [id number] Date of Birth: 1945/07/26  Today's Date: 03/05/2014 PT Individual Time: 0900-1007 PT Individual Time Calculation (min): 67 min   Short Term Goals: Week 1:  PT Short Term Goal 1 (Week 1): Pt will perform all basic transfers with S PT Short Term Goal 2 (Week 1): Pt will be able to reach 10" outside BOS with Min A for functional reaching PT Short Term Goal 3 (Week 1): Pt will performed WC mobility x100' with 1 cue for L attention PT Short Term Goal 4 (Week 1): Pt will ambulate 100' with RW and Min A PT Short Term Goal 5 (Week 1): Pt will ascend/descend 5 stairs with 2 rails and Min A   Skilled Therapeutic Interventions/Progress Updates:  tx focused on transfers, neuro re-ed via forced use, VCS, manual cues in sitting, standing, gait  Stand pivot to R with min assist to mat and bed.  W/c propulsion using bil LEs x 160' with intermittent min assist, mod cues for attention to task and LLE activation.  Gait with mod/max assist, weighted grocery cart, x 25', x 32' focusing on decreasing LLE scissoring, increasing L wt shift and upright trunk, forward gaze.  Gait limited by LBP on R side.    Pt exhausted; requested resting in bed at end of session.  RN informed of LBP.  Pt has poor insight into deficits, and stated" when am I going to have another work-out like that; another couple days and I will be ready to go home!"  Mod multi-modal cues needed for eye contact and attention to L during conversation.    Therapy Documentation Precautions:  Precautions Precautions: Fall Precaution Comments: L hemi Restrictions Weight Bearing Restrictions: No   Pain: Pain Assessment Pain Assessment: No/denies pain; at end of session, pt c/o of LBP, unrated.  Rn informed.   Locomotion : Ambulation Ambulation/Gait Assistance: 3: Mod assist;2: Max assist    Other Treatments: Treatments Therapeutic Activities for  neuro re-ed: reaching L and R out of BOS with R and L hand for objects to promote trunk shortening/lengthening/attention to L; pelvic dissociation with scooting forward and backward in sitting, LLE wt shidting during step taps RLE; w/c propulsion using bil LEs; gait using weighted grocery cart  Neuromuscular Facilitation: Left;Lower Extremity;Upper Extremity;Forced use;Activity to increase grading;Activity to increase lateral weight shifting;Activity to increase anterior-posterior weight shifting;Activity to increase sustained activation, attention to L Weight Bearing Technique Weight Bearing Technique: Yes LUE Weight Bearing Technique: Forearm seated;Forearm standing Response to Weight Bearing Technique: activation  See FIM for current functional status  Therapy/Group: Individual Therapy  Jammie Clink 03/05/2014, 10:57 AM

## 2014-03-05 NOTE — Patient Care Conference (Signed)
Inpatient RehabilitationTeam Conference and Plan of Care Update Date: 03/05/2014   Time: 10;55 AM    Patient Name: Brian [name]      Medical Record Number: [id number]  Date of Birth: [birth year] Sex: Male         Room/Bed: 4W11C/4W11C-01 Payor Info: Payor: MEDICARE / Plan: MEDICARE PART A AND B / Product Type: *No Product type* /    Admitting Diagnosis: SAH  Admit Date/Time:  03/03/2014  3:04 PM Admission Comments: No comment available   Primary Diagnosis:  Subarachnoid hemorrhage Principal Problem: Subarachnoid hemorrhage  Patient Active Problem List   Diagnosis Date Noted  . Acute left hemiparesis 03/04/2014  . Subarachnoid hemorrhage 03/03/2014  . Subarachnoid hemorrhage due to ruptured aneurysm 02/20/2014  . Subarachnoid hematoma 02/20/2014  . Abdominal  pain, other specified site 09/29/2011  . C. difficile colitis 05/24/2011  . Hypercoagulable state 05/24/2011  . Anemia 05/20/2011  . Long term current use of anticoagulant 05/20/2011  . Abnormal gallbladder ultrasound 05/20/2011  . Sacral decubitus ulcer, stage II 05/20/2011  . Malnutrition, calorie 05/20/2011  . H/O: CVA (cardiovascular accident) 04/20/2011  . Vasculitis 12/24/2010  . Renal failure, acute 12/24/2010  . Colitis 12/16/2010  . Essential hypertension 12/16/2010  . GERD (gastroesophageal reflux disease) 12/16/2010  . Nausea and vomiting 12/13/2010  . Abdominal pain, acute, epigastric 12/13/2010  . Leukocytosis 12/13/2010    Expected Discharge Date: Expected Discharge Date: 03/19/14  Team Members Present: Physician leading conference: Dr. 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 Per report incontinent of bladder at [facility]  monitor for incontinence and timed toileting  monitor patient for incontinence at [facility]   Swallow/Nutrition/ Hydration   Dys 2 solids, thin liquids; full supervision due to cognitive deficits   supervision with least restrictive diet   trials of advanced consistencies, diet toleration     ADL's   min A bathing, UB dressing, toilet transfers; max A LB dressing, toileting  supervision with self care, min A with simple meal prep  dynamic sitting and standing balance, LUE neuro re-ed, cognitive activities, functional mobility   Mobility   min transfer, mod/max gait x 32' 03/05/14; stairs wtih mod assist x 5 02/22/14, w/c prop x 160' with min assist  Mod I transfers, S gait x150', S stairs with 1 rail, Mod I WC  L NMR, L-sided attention tasks, balance training, gait and stairs.    Communication             Safety/Cognition/ Behavioral Observations  Max assist   min assist for basic tasks   sustained attention, basic problem solving, memory of daily information    Pain   <4  assess pain level after therapy session, and pre-medicate PRN  monitor effectiveness of medication   Skin   Monitor skin q shift for breakdown  No skin breakdown  Assess skin q shift      *See Care Plan and progress notes for long and short-term goals.  Barriers to Discharge: ?caregiver    Possible Resolutions to Barriers:  ID caregiver, cont rehab  Discharge Planning/Teaching Needs:  Home with son anf PCS worker, will see if can increase PCS worker's hours from 2 hrs to 4 hrs.      Team Discussion:  Goals-supervision/min for cooking.  Pt loves to cook.  Spascity in legs scissoring gait from previous strokes. Back pain limits his participation.  Field cut also. Will need 24 hr supervision for safety  Revisions to Treatment Plan:  None   Continued Need for  Acute Rehabilitation Level of Care: The patient requires daily medical management by a physician with specialized training in physical medicine and rehabilitation for the following conditions: Daily direction of a multidisciplinary physical rehabilitation program to ensure safe treatment while eliciting the highest outcome that is of practical value to the patient.: Yes Daily medical management of patient stability for increased activity during participation in an intensive rehabilitation regime.: Yes Daily analysis of laboratory values and/or radiology reports with any subsequent need for medication adjustment of medical intervention for : Neurological problems;Other  Elease Hashimoto 03/06/2014, 9:17 AM

## 2014-03-05 NOTE — Progress Notes (Signed)
Subjective/Complaints: Pt without new issues OT notes scissoring during gait but not transfers CT reviewed some encephalomalacia Left parietal New R MCA thrombus, SAH Review of Systems - Negative except poor appetite  Objective: Vital Signs: Blood pressure 105/73, pulse 81, temperature 98.6 F (37 C), temperature source Oral, resp. rate 18, height _0  (1.702 m), weight 65.137 kg (143 lb 9.6 oz), SpO2 100 %. No results found. Results for orders placed or performed during the hospital encounter of 03/03/14 (from the past 72 hour(s))  CBC WITH DIFFERENTIAL     Status: Abnormal   Collection Time: 03/04/14  6:23 AM  Result Value Ref Range   WBC 8.5 4.0 - 10.5 K/uL   RBC 3.94 (L) 4.22 - 5.81 MIL/uL   Hemoglobin 11.5 (L) 13.0 - 17.0 g/dL   HCT 36.3 (L) 39.0 - 52.0 %   MCV 92.1 78.0 - 100.0 fL   MCH 29.2 26.0 - 34.0 pg   MCHC 31.7 30.0 - 36.0 g/dL   RDW 15.3 11.5 - 15.5 %   Platelets 276 150 - 400 K/uL   Neutrophils Relative % 48 43 - 77 %   Neutro Abs 4.0 1.7 - 7.7 K/uL   Lymphocytes Relative 38 12 - 46 %   Lymphs Abs 3.2 0.7 - 4.0 K/uL   Monocytes Relative 11 3 - 12 %   Monocytes Absolute 0.9 0.1 - 1.0 K/uL   Eosinophils Relative 4 0 - 5 %   Eosinophils Absolute 0.3 0.0 - 0.7 K/uL   Basophils Relative 1 0 - 1 %   Basophils Absolute 0.1 0.0 - 0.1 K/uL  Comprehensive metabolic panel     Status: Abnormal   Collection Time: 03/04/14  6:23 AM  Result Value Ref Range   Sodium 131 (L) 135 - 145 mmol/L    Comment: Please note change in reference range.   Potassium 4.3 3.5 - 5.1 mmol/L    Comment: Please note change in reference range.   Chloride 105 96 - 112 mEq/L   CO2 21 19 - 32 mmol/L   Glucose, Bld 88 70 - 99 mg/dL   BUN 22 6 - 23 mg/dL   Creatinine, Ser 1.01 0.50 - 1.35 mg/dL   Calcium 8.7 8.4 - 10.5 mg/dL   Total Protein 7.6 6.0 - 8.3 g/dL   Albumin 3.1 (L) 3.5 - 5.2 g/dL   AST 25 0 - 37 U/L   ALT 22 0 - 53 U/L   Alkaline Phosphatase 66 39 - 117 U/L   Total Bilirubin  0.3 0.3 - 1.2 mg/dL   GFR calc non Af Amer 75 (L) >90 mL/min   GFR calc Af Amer 87 (L) >90 mL/min    Comment: (NOTE) The eGFR has been calculated using the CKD EPI equation. This calculation has not been validated in all clinical situations. eGFR's persistently <90 mL/min signify possible Chronic Kidney Disease.    Anion gap 5 5 - 15     HEENT: normal Cardio: RRR and no murmur Resp: CTA B/L and Unlabored GI: BS positive and nontender nondistended Extremity:  Pulses positive and No Edema Skin:   Intact Neuro: Confused, Abnormal Sensory decreased left L5 dermatome and Abnormal Motor 3 minus left biceps triceps grip deltoid, 4/5 left hip flexor and knee extensor and ankle dorsiflexor Musc/Skel:  Other tight hamstrings bilaterally Gen. no distress, increased hip adductor tone   Assessment/Plan: 1. Functional deficits secondary to Right SAH from aneurysm rupture which require 3+ hours per day of interdisciplinary therapy in  a comprehensive inpatient rehab setting. Physiatrist is providing close team supervision and 24 hour management of active medical problems listed below. Physiatrist and rehab team continue to assess barriers to discharge/monitor patient progress toward functional and medical goals. FIM: FIM - Bathing Bathing Steps Patient Completed: Chest, Right Arm, Left Arm, Abdomen, Buttocks, Front perineal area, Right upper leg, Left upper leg, Right lower leg (including foot) Bathing: 4: Min-Patient completes 8-9 37f10 parts or 75+ percent  FIM - Upper Body Dressing/Undressing Upper body dressing/undressing steps patient completed: Put head through opening of pull over shirt/dress, Pull shirt over trunk, Thread/unthread right sleeve of pullover shirt/dresss, Thread/unthread left sleeve of pullover shirt/dress Upper body dressing/undressing: 5: Set-up assist to: Obtain clothing/put away FIM - Lower Body Dressing/Undressing Lower body dressing/undressing steps patient completed:  Pull underwear up/down, Pull pants up/down, Thread/unthread right pants leg, Thread/unthread left pants leg, Fasten/unfasten pants, Don/Doff left sock, Don/Doff right sock Lower body dressing/undressing: 3: Mod-Patient completed 50-74% of tasks  FIM - Toileting Toileting steps completed by patient: Performs perineal hygiene Toileting: 2: Max-Patient completed 1 of 3 steps  FIM - TRadio producerDevices: Grab bars Toilet Transfers: 4-From toilet/BSC: Min A (steadying Pt. > 75%), 4-To toilet/BSC: Min A (steadying Pt. > 75%)  FIM - BControl and instrumentation engineerDevices: Arm rests, WCopy 5: Sit > Supine: Supervision (verbal cues/safety issues), 4: Bed > Chair or W/C: Min A (steadying Pt. > 75%), 3: Chair or W/C > Bed: Mod A (lift or lower assist)  FIM - Locomotion: Wheelchair Distance: 20 Locomotion: Wheelchair: 1: Travels less than 50 ft with minimal assistance (Pt.>75%) FIM - Locomotion: Ambulation Locomotion: Ambulation Assistive Devices: WAdministratorAmbulation/Gait Assistance: 2: Max assist Locomotion: Ambulation: 1: Travels less than 50 ft with maximal assistance (Pt: 25 - 49%)  Comprehension Comprehension Mode: Auditory Comprehension: 4-Understands basic 75 - 89% of the time/requires cueing 10 - 24% of the time  Expression Expression Mode: Verbal Expression: 4-Expresses basic 75 - 89% of the time/requires cueing 10 - 24% of the time. Needs helper to occlude trach/needs to repeat words.  Social Interaction Social Interaction: 4-Interacts appropriately 75 - 89% of the time - Needs redirection for appropriate language or to initiate interaction.  Problem Solving Problem Solving: 2-Solves basic 25 - 49% of the time - needs direction more than half the time to initiate, plan or complete simple activities  Memory Memory: 3-Recognizes or recalls 50 - 74% of the time/requires cueing 25 - 49% of the time  Medical  Problem List and Plan: 1. 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 DVT Prophylaxis/Anticoagulation: SCDs. Monitor for any signs of DVT 3. Pain Management: Tylenol as needed 4. History of CVA 2 with hypercoagulable state. Coumadin on hold due to subarachnoid hemorrhage. 5. Neuropsych: This patient is capable of making decisions on his own behalf. 6. Skin/Wound Care: Routine skin checks 7. Fluids/Electrolytes/Nutrition: Strict I and O follow-up chemistries 8. Hypertension. Nimotop protocol to end 03/13/2014. Monitor with increased mobility 9. Dysphagia. Dysphagia 2 thin liquid diet. Monitor for any aspiration. Follow-up speech therapy 10. MRSA PCR screening positive. Contact precautions 11. Thrush: nystatin swish and swallow  LOS (Days) 2 A FACE TO FACE EVALUATION WAS PERFORMED  KIRSTEINS,ANDREW E 03/05/2014, 9:05 AM

## 2014-03-05 NOTE — Progress Notes (Signed)
Occupational Therapy Session Note  Patient Details  Name: Brian [name] MRN: [id number] Date of Birth: 10-Jan-1946  Today's Date: 03/05/2014 OT Individual Time: 0800-0900 OT Individual Time Calculation (min): 60 min    Short Term Goals: Week 1:  OT Short Term Goal 1 (Week 1): Pt will transfer to and from toilet to w/c with supervision. OT Short Term Goal 2 (Week 1): Pt will toilet with min A. OT Short Term Goal 3 (Week 1): Pt will be set up with UB dressing. OT Short Term Goal 4 (Week 1): Pt will stand with steadying A to don pants over hips. OT Short Term Goal 5 (Week 1): Pt will be able to don pants over feet and socks with extra time.   Skilled Therapeutic Interventions/Progress Updates:      Pt seen for BADL retraining of toileting, bathing at shower level, and dressing with a focus on left visual field awareness, balance, use of LUE, and problem solving. Pt received sitting in chair. He needed to use toilet, completed a stand pivot with the grab bars to toilet with min A and then maintained static standing with S as he held the bar to doff his briefs. Pt was able to cleanse himself from sitting with S post toileting.  He completed stand pivot w/c >< shower chair min A with bars and bathed with steady A in standing and A with L foot. Pt is wearing a brief during the day, he was able to don jeans over his feet and pull them up.  Extra time needed to fasten jeans and belt. Pt was able to don his socks today, needs A with shoes. Overall, improved praxis, coordination and standing balance. Pt completed grooming and was then taken to his PT session.  Therapy Documentation Precautions:  Precautions Precautions: Fall Precaution Comments: L hemi Restrictions Weight Bearing Restrictions: No     Pain: Pain Assessment Pain Assessment: No/denies pain ADL: ADL ADL Comments: Refer to FIM  See FIM for current functional status  Therapy/Group: Individual  Therapy  [place] 03/05/2014, 10:22 AM

## 2014-03-05 NOTE — Progress Notes (Signed)
Social Work Patient ID: Brian [name], male   DOB: 1945-07-16, 69 y.o.   MRN: [id number] Met with pt and daughter to discuss team conference goals-supervision level and discharge 1/27.  He feels he can make it that long.  He was hoping it would be quicker. He is tired form all of this therapy and needs a rest, he doesn;t do this much at home.  Daughter brought his clothes and will be involved when needed.  Her brother is the  Main one with pt at home.  May need to have him come in prior to discharge home.  Work on discharge plans.

## 2014-03-05 NOTE — Progress Notes (Signed)
Speech Language Pathology Daily Session Note  Patient Details  Name: Brian [name] MRN: [id number] Date of Birth: [birth year]  Today's Date: 03/05/2014 SLP Individual Time: 1300-1400 SLP Individual Time Calculation (min): 60 min  Short Term Goals: Week 1: SLP Short Term Goal 1 (Week 1): Pt will tolerate presentations of his currently prescribed diet with no overt s/s of aspiration and supervision cues for use of swallowing precautions.  SLP Short Term Goal 2 (Week 1): Pt will improve mastication of dys 3 textures over 2-3 consecutive sessions with min-supervision cues to monitor left pocketing  SLP Short Term Goal 3 (Week 1): Pt will improve basic functional problem solving over 75% of observable opportunities with mod assist multimodal cues.  SLP Short Term Goal 4 (Week 1): Pt will improve recall of basic, daily information for 75% accuracy with mod assist multimodal cues for use of external aids.  SLP Short Term Goal 5 (Week 1): Pt will sustain his attention during functional tasks for 5-7 minutes with min-mod cues for redirection.   Skilled Therapeutic Interventions: Skilled treatment session focused on addressing cognitive-linguistic goals.  SLP facilitated session with Mod question cues to utilize external aids for orientation; Max faded to Mod verbal cues to sustain attention to basic problem solving task for 10 minutes.  Patient required Min verbal cues to attend to left upper extremity during the structured task.  SLP also facilitated session with Max question cues to recall sequence of bed to chair transfer, Max verbal cues for overall safety and Min multimodal cues to attend to left upper extremity.  Patient required Total assist for rational for use of call bell.  Continue with current plan of care.   FIM:  Comprehension Comprehension Mode: Auditory Comprehension: 3-Understands basic 50 - 74% of the time/requires cueing 25 - 50%  of the time Expression Expression Mode:  Verbal Expression: 4-Expresses basic 75 - 89% of the time/requires cueing 10 - 24% of the time. Needs helper to occlude trach/needs to repeat words. Social Interaction Social Interaction: 3-Interacts appropriately 50 - 74% of the time - May be physically or verbally inappropriate. Problem Solving Problem Solving: 2-Solves basic 25 - 49% of the time - needs direction more than half the time to initiate, plan or complete simple activities Memory Memory: 2-Recognizes or recalls 25 - 49% of the time/requires cueing 51 - 75% of the time  Pain Pain Assessment Pain Assessment: 0-10 Pain Score: 3  Pain Type: Acute pain Pain Location: Head Pain Intervention(s): RN made aware Multiple Pain Sites: No  Therapy/Group: Individual Therapy  Carmelia Roller., CCC-SLP [phone]  [place] 03/05/2014, 3:33 PM

## 2014-03-06 ENCOUNTER — Inpatient Hospital Stay (HOSPITAL_COMMUNITY): Payer: Medicare Other | Admitting: Occupational Therapy

## 2014-03-06 ENCOUNTER — Inpatient Hospital Stay (HOSPITAL_COMMUNITY): Payer: Medicare Other | Admitting: Speech Pathology

## 2014-03-06 ENCOUNTER — Inpatient Hospital Stay (HOSPITAL_COMMUNITY): Payer: Medicare Other

## 2014-03-06 NOTE — Progress Notes (Signed)
Speech Language Pathology Daily Session Note  Patient Details  Name: Brian [name] MRN: [id number] Date of Birth: 1945/07/21  Today's Date: 03/06/2014 SLP Individual Time: 1100-1200 SLP Individual Time Calculation (min): 60 min  Short Term Goals: Week 1: SLP Short Term Goal 1 (Week 1): Pt will tolerate presentations of his currently prescribed diet with no overt s/s of aspiration and supervision cues for use of swallowing precautions.  SLP Short Term Goal 2 (Week 1): Pt will improve mastication of dys 3 textures over 2-3 consecutive sessions with min-supervision cues to monitor left pocketing  SLP Short Term Goal 3 (Week 1): Pt will improve basic functional problem solving over 75% of observable opportunities with mod assist multimodal cues.  SLP Short Term Goal 4 (Week 1): Pt will improve recall of basic, daily information for 75% accuracy with mod assist multimodal cues for use of external aids.  SLP Short Term Goal 5 (Week 1): Pt will sustain his attention during functional tasks for 5-7 minutes with min-mod cues for redirection.   Skilled Therapeutic Interventions:  Pt was seen for skilled ST targeting goals for dysphagia and cognition.  Upon arrival, pt was partially reclined in chair, awake, alert, and agreeable to participate in [place].  Pt transferred from recliner to wheelchair to maximize attention, alertness, and swallowing function during structured therapeutic tasks.  SLP completed skilled observations with presentation of pt's currently prescribed diet with pt exhibiting prolonged mastication of dys 2 solids with adequate clearance of residual solids from the oral cavity with increased time.  No overt s/s of aspiration were noted with cup sips of thin liquids.  At the end of the session, pt required mod-max verbal cues for initiating and sequencing wheelchair propulsion due to decreased awareness of his left upper and lower extremity.  Upon return to the room, pt initiated a request for  assistance appropriately to use the restroom.  Continue per current plan of care.     FIM:  Comprehension Comprehension Mode: Auditory Comprehension: 4-Understands basic 75 - 89% of the time/requires cueing 10 - 24% of the time Expression Expression Mode: Verbal Expression: 4-Expresses basic 75 - 89% of the time/requires cueing 10 - 24% of the time. Needs helper to occlude trach/needs to repeat words. Social Interaction Social Interaction: 4-Interacts appropriately 75 - 89% of the time - Needs redirection for appropriate language or to initiate interaction. Problem Solving Problem Solving: 3-Solves basic 50 - 74% of the time/requires cueing 25 - 49% of the time Memory Memory: 2-Recognizes or recalls 25 - 49% of the time/requires cueing 51 - 75% of the time FIM - Eating Eating Activity: 5: Needs verbal cues/supervision  Pain Pain Assessment Pain Assessment: No/denies pain  Therapy/Group: Individual Therapy  Kasidy Gianino, Selinda Orion 03/06/2014, 4:03 PM

## 2014-03-06 NOTE — Progress Notes (Signed)
Occupational Therapy Session Note  Patient Details  Name: Brian [name] MRN: [id number] Date of Birth: 05-22-1945  Today's Date: 03/06/2014 OT Individual Time: 0905-1005 OT Individual Time Calculation (min): 60 min    Short Term Goals: Week 1:  OT Short Term Goal 1 (Week 1): Pt will transfer to and from toilet to w/c with supervision. OT Short Term Goal 2 (Week 1): Pt will toilet with min A. OT Short Term Goal 3 (Week 1): Pt will be set up with UB dressing. OT Short Term Goal 4 (Week 1): Pt will stand with steadying A to don pants over hips. OT Short Term Goal 5 (Week 1): Pt will be able to don pants over feet and socks with extra time.   Skilled Therapeutic Interventions/Progress Updates:    Pt seen for BADL retraining of bathing at shower level and dressing with a focus on postural control, sequencing and problem solving, and active use of LUE. Pt was more lethargic today with increased time needed for responding and for processing questions.  His BP was WNL per nurse assessment.  Pt was not eager to shower, but did agree to it. Pt transferred from w/c to shower bench using grab bars. He is able to stand up, but needs guiding assist to lower smoothly. In shower, he demonstrated improved use of his LUE to bathe his R arm. He needs frequent cues for safe foot positioning prior to standing. He then dried off and transferred back to chair and completed dressing from w/c with cues to begin with hemiside. To don his underwear and pants, he crosses his L over his R. He tried to do this with his L sock, but had increased difficulty today. He then crossed his R over L to don R sock, but was leaning to far to R and almost fell. He did not recognize this quickly and needed the therapist to intervene. He did actively use his L hand to pull pants up. Pt needed several rest breaks, continually stating his body was exhausted from yesterday. Pt then transferred to recliner with min A stand pivot. Quick release  belt applied and call light and phone in reach.  Therapy Documentation Precautions:  Precautions Precautions: Fall Precaution Comments: L hemi Restrictions Weight Bearing Restrictions: No    Vital Signs: Therapy Vitals Temp: 97.7 F (36.5 C) Temp Source: Oral Pulse Rate: 93 Resp: 16 BP: 95/78 mmHg Patient Position (if appropriate): Sitting Oxygen Therapy SpO2: 100 % O2 Device: Not Delivered Pain: Pain Assessment Pain Assessment: No/denies pain ADL: ADL ADL Comments: Refer to FIM  See FIM for current functional status  Therapy/Group: Individual Therapy  Amrit Cress 03/06/2014, 11:53 AM

## 2014-03-06 NOTE — Progress Notes (Signed)
Social Work Elease Hashimoto, LCSW Social Worker Signed  Patient Care Conference 03/05/2014  1:52 PM    Expand All Collapse All   Inpatient RehabilitationTeam Conference and Plan of Care Update Date: 03/05/2014   Time: 10;55 AM     Patient Name: Brian [name]       Medical Record Number: [id number]  Date of Birth: [birth year] Sex: Male         Room/Bed: 4W11C/4W11C-01 Payor Info: Payor: MEDICARE / Plan: MEDICARE PART A AND B / Product Type: *No Product type* /    Admitting Diagnosis: SAH  Admit Date/Time:  03/03/2014  3:04 PM Admission Comments: No comment available   Primary Diagnosis:  Subarachnoid hemorrhage Principal Problem: Subarachnoid hemorrhage    Patient Active Problem List     Diagnosis  Date Noted   .  Acute left hemiparesis  03/04/2014   .  Subarachnoid hemorrhage  03/03/2014   .  Subarachnoid hemorrhage due to ruptured aneurysm  02/20/2014   .  Subarachnoid hematoma  02/20/2014   .  Abdominal  pain, other specified site  09/29/2011   .  C. difficile colitis  05/24/2011   .  Hypercoagulable state  05/24/2011   .  Anemia  05/20/2011   .  Long term current use of anticoagulant  05/20/2011   .  Abnormal gallbladder ultrasound  05/20/2011   .  Sacral decubitus ulcer, stage II  05/20/2011   .  Malnutrition, calorie  05/20/2011   .  H/O: CVA (cardiovascular accident)  04/20/2011   .  Vasculitis  12/24/2010   .  Renal failure, acute  12/24/2010   .  Colitis  12/16/2010   .  Essential hypertension  12/16/2010   .  GERD (gastroesophageal reflux disease)  12/16/2010   .  Nausea and vomiting  12/13/2010   .  Abdominal pain, acute, epigastric  12/13/2010   .  Leukocytosis  12/13/2010     Expected Discharge Date: Expected Discharge Date: 03/19/14  Team Members Present: Physician leading conference: Dr. 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 Per report incontinent of bladder at [facility]  monitor for incontinence and timed toileting   monitor patient for incontinence at [facility]    Swallow/Nutrition/ Hydration     Dys 2 solids, thin liquids; full supervision due to cognitive deficits   supervision with least restrictive diet   trials of advanced consistencies, diet toleration    ADL's     min A bathing, UB dressing, toilet transfers; max A LB dressing, toileting  supervision with self care, min A with simple meal prep  dynamic sitting and standing balance, LUE neuro re-ed, cognitive activities, functional mobility   Mobility     min transfer, mod/max gait x 32' 03/05/14; stairs wtih mod assist x 5 02/22/14, w/c prop x 160' with min assist  Mod I transfers, S gait x150', S stairs with 1 rail, Mod I WC   L NMR, L-sided attention tasks, balance training, gait and  stairs.    Communication               Safety/Cognition/ Behavioral Observations    Max assist   min assist for basic tasks   sustained attention, basic problem solving, memory of daily information    Pain     <4  assess pain level after therapy session, and pre-medicate PRN  monitor effectiveness of medication    Skin     Monitor skin q shift for breakdown   No skin breakdown  Assess skin q shift      *See Care Plan and progress notes for long and short-term goals.    Barriers to Discharge:  ?caregiver     Possible Resolutions to Barriers:   ID caregiver, cont rehab     Discharge Planning/Teaching Needs:   Home with son anf PCS worker, will see if can increase PCS worker's hours from 2 hrs to 4 hrs.       Team Discussion:     Goals-supervision/min for cooking.  Pt loves to cook.  Spascity in legs scissoring gait from previous strokes. Back pain limits his participation.  Field cut also. Will need 24 hr supervision for safety   Revisions to Treatment Plan:    None    Continued Need for Acute Rehabilitation Level of Care: The patient requires daily medical management by a physician with specialized training in physical medicine and rehabilitation for the following conditions: Daily direction of a multidisciplinary physical rehabilitation program to ensure safe treatment while eliciting the highest outcome that is of practical value to the patient.: Yes Daily medical management of patient stability for increased activity during participation in an intensive rehabilitation regime.: Yes Daily analysis of laboratory values and/or radiology reports with any subsequent need for medication adjustment of medical intervention for : Neurological problems;Other  Elease Hashimoto 03/06/2014, 9:17 AM                  Patient ID: Brian [name], male   DOB: [birth year], 69 y.o.   MRN: [id number]

## 2014-03-06 NOTE — Progress Notes (Signed)
Physical Therapy Session Note  Patient Details  Name: Brian [name] MRN: [id number] Date of Birth: [birth year]  Today's Date: 03/06/2014 PT Individual Time: 1400-1500 PT Individual Time Calculation (min): 60 min   Short Term Goals: Week 1:  PT Short Term Goal 1 (Week 1): Pt will perform all basic transfers with S PT Short Term Goal 2 (Week 1): Pt will be able to reach 10" outside BOS with Min A for functional reaching PT Short Term Goal 3 (Week 1): Pt will performed WC mobility x100' with 1 cue for L attention PT Short Term Goal 4 (Week 1): Pt will ambulate 100' with RW and Min A PT Short Term Goal 5 (Week 1): Pt will ascend/descend 5 stairs with 2 rails and Min A   Skilled Therapeutic Interventions/Progress Updates:  Patient resting in bed upon entering room. Patient supine to sit using bed rails and increase tone to wheelchair with min steady assist. Patient had very little weight bearing through left LE during transfer. Patient to gym and worked on transfer to mat with increased weight bearing through left LE. NMR and stretching performed to left LE in supine on mat to decrease tone and facilitate more controlled active movement. Patient stood at table top to take medications provided by nurse again working on increasing weight bearing through left LE. Patient reported needing to use the bathroom. Patient performed toileting tasks (pants down, up and hygiene) with steady assist at toilet. Patient worked on weight shifting and balance using the [employer] balance system for weight shifting side to side, anterior/posterior, and limits of stability on stationary surface with UE support. Patient had most difficulty with anterior movements to left. Patient ambulated with +2 HHA assist 60 feet using board between LE's to decrease scissoring motion as well as visual cueing of green line to maintain wider base of support. Patient seemed to do best with green line visual cue. Patient ambulated with rolling  walker 60 feet x 1 and 100 feet x 1 with +1 mod assist to facilitate hip extension during stance on left and verbal cueing for foot placement. Patient left in room in bed with all items in reach and alarm set.   Therapy Documentation Precautions:  Precautions Precautions: Fall Precaution Comments: L hemi Restrictions Weight Bearing Restrictions: No  Pain: Pain Assessment Pain Assessment: No/denies pain  Locomotion : Ambulation Ambulation/Gait Assistance: 3: Mod assist   See FIM for current functional status  Therapy/Group: Individual Therapy  Sanjuana Letters 03/06/2014, 3:57 PM

## 2014-03-06 NOTE — IPOC Note (Signed)
Overall Plan of Care [facility]) Patient Details Name: Brian [name] MRN: [id number] DOB: 05-21-1945  Admitting Diagnosis: [facility]  Hospital Problems: Principal Problem:   Subarachnoid hemorrhage Active Problems:   Essential hypertension   Acute left hemiparesis     Functional Problem List: Nursing Endurance, Medication Management, Motor, Nutrition, Sensory, Bladder, Safety  PT Balance, Endurance, Motor, Perception, Safety, Sensory  OT Balance, Cognition, Motor, Perception, Safety, Sensory, Vision  SLP Cognition, Nutrition  TR         Basic ADL's: OT Bathing, Dressing, Toileting, Grooming, Eating     Advanced  ADL's: OT Simple Meal Preparation     Transfers: PT Bed Mobility, Bed to Chair, Car, Furniture, Floor  OT Toilet, Metallurgist: PT Ambulation, Emergency planning/management officer, Stairs     Additional Impairments: OT Fuctional Use of Upper Extremity  SLP Swallowing, Social Cognition   Problem Solving, Memory, Attention, Awareness  TR      Anticipated Outcomes Item Anticipated Outcome  Self Feeding mod I  Swallowing  supervision with least restrictive diet    Basic self-care  supervision  Toileting  supervision   Bathroom Transfers supervision  Bowel/Bladder  manage bladder with level 5 assist of staff to empty urinal and manage bowel with level 5 assist of BSC empty by staff  Transfers  Mod I   Locomotion  Supervision  Communication     Cognition  min assist for basic to semi-complex tasks   Pain  n/a  Safety/Judgment  maintain safety with cues/reminders   Therapy Plan: PT Intensity: Minimum of 1-2 x/day ,45 to 90 minutes PT Frequency: 5 out of 7 days PT Duration Estimated Length of Stay: 14-6 days OT Intensity: Minimum of 1-2 x/day, 45 to 90 minutes OT Frequency: 5 out of 7 days OT Duration/Estimated Length of Stay: 12-14 days SLP Intensity: Minumum of 1-2 x/day, 30 to 90 minutes SLP Frequency: 5 out of 7 days SLP Duration/Estimated Length  of Stay: 14 days        Team Interventions: Nursing Interventions Patient/Family Education, Dysphagia/Aspiration Precaution Training, Bladder Management, Medication Management, Discharge Planning, Psychosocial Support, Disease Management/Prevention  PT interventions Ambulation/gait training, Balance/vestibular training, Cognitive remediation/compensation, Discharge planning, Disease management/prevention, DME/adaptive equipment instruction, Functional mobility training, Neuromuscular re-education, Pain management, Patient/family education, Psychosocial support, Skin care/wound management, Splinting/orthotics, Stair training, Therapeutic Activities, Therapeutic Exercise, UE/LE Strength taining/ROM, UE/LE Coordination activities, Visual/perceptual remediation/compensation, Wheelchair propulsion/positioning  OT Interventions Balance/vestibular training, Cognitive remediation/compensation, Discharge planning, DME/adaptive equipment instruction, Functional electrical stimulation, Functional mobility training, Neuromuscular re-education, Patient/family education, Self Care/advanced ADL retraining, Therapeutic Activities, Therapeutic Exercise, UE/LE Strength taining/ROM, UE/LE Coordination activities, Visual/perceptual remediation/compensation  SLP Interventions Cognitive remediation/compensation, Cueing hierarchy, Dysphagia/aspiration precaution training, Functional tasks, Patient/family education, Internal/external aids, Environmental controls  TR Interventions    SW/CM Interventions Discharge Planning, Psychosocial Support, Patient/Family Education    Team Discharge Planning: Destination: PT-Home ,OT- Home , SLP-Home Projected Follow-up: PT-Home health PT, 24 hour supervision/assistance, OT-  Home health OT, SLP-Home Health SLP, 24 hour supervision/assistance Projected Equipment Needs: PT-To be determined, OT- Tub/shower bench, SLP-None recommended by SLP Equipment Details: PT-Pt may already have all  equipment needed, OT-  Patient/family involved in discharge planning: PT- Patient,  OT-Patient, SLP-Patient  MD ELOS: 10-15d  Medical Rehab Prognosis:  Good Assessment: 69 y.o. right handed male with history of tobacco abuse, hypertension,? CVA 2 as well as hypercoagulable state maintained on chronic Coumadin. As reported patient had recently ran out of his medication for the past week. He lives with his son  used a walker prior to admission and has a home health aide 2-3 hours per day. Presented to [facility] 02/19/2014 with headache and neck pain. Blood pressure 161/113. INR upon admission of 1.48. 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 Patient was transferred to [facility] for ongoing evaluation. 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 Kathyrn Sheriff 02/20/2014. Patient remains Nimotop monitoring for any vasospasms. MRSA PCR screening positive maintained on contact precautions. He is on a dysphagia 2 thin liquid diet    See Team Conference Notes for weekly updates to the plan of care

## 2014-03-06 NOTE — Progress Notes (Signed)
Patient ID: Brian [name], male   DOB: 09-12-1945, 69 y.o.   MRN: [id number]  Subjective/Complaints: Oriented to person place but not time Patient gives history of left leg crossing over the right leg. More some type of brace prescribed by his orthopedic surgeon. He could not tell me how long ago this was CT reviewed some encephalomalacia Left parietal New R MCA thrombus, SAH Review of Systems - Negative except poor appetite  Objective: Vital Signs: Blood pressure 95/78, pulse 93, temperature 97.7 F (36.5 C), temperature source Oral, resp. rate 16, height '5\' 7"'  (1.702 m), weight 65.137 kg (143 lb 9.6 oz), SpO2 100 %. No results found. Results for orders placed or performed during the hospital encounter of 03/03/14 (from the past 72 hour(s))  CBC WITH DIFFERENTIAL     Status: Abnormal   Collection Time: 03/04/14  6:23 AM  Result Value Ref Range   WBC 8.5 4.0 - 10.5 K/uL   RBC 3.94 (L) 4.22 - 5.81 MIL/uL   Hemoglobin 11.5 (L) 13.0 - 17.0 g/dL   HCT 36.3 (L) 39.0 - 52.0 %   MCV 92.1 78.0 - 100.0 fL   MCH 29.2 26.0 - 34.0 pg   MCHC 31.7 30.0 - 36.0 g/dL   RDW 15.3 11.5 - 15.5 %   Platelets 276 150 - 400 K/uL   Neutrophils Relative % 48 43 - 77 %   Neutro Abs 4.0 1.7 - 7.7 K/uL   Lymphocytes Relative 38 12 - 46 %   Lymphs Abs 3.2 0.7 - 4.0 K/uL   Monocytes Relative 11 3 - 12 %   Monocytes Absolute 0.9 0.1 - 1.0 K/uL   Eosinophils Relative 4 0 - 5 %   Eosinophils Absolute 0.3 0.0 - 0.7 K/uL   Basophils Relative 1 0 - 1 %   Basophils Absolute 0.1 0.0 - 0.1 K/uL  Comprehensive metabolic panel     Status: Abnormal   Collection Time: 03/04/14  6:23 AM  Result Value Ref Range   Sodium 131 (L) 135 - 145 mmol/L    Comment: Please note change in reference range.   Potassium 4.3 3.5 - 5.1 mmol/L    Comment: Please note change in reference range.   Chloride 105 96 - 112 mEq/L   CO2 21 19 - 32 mmol/L   Glucose, Bld 88 70 - 99 mg/dL   BUN 22 6 - 23 mg/dL   Creatinine, Ser 1.01 0.50 -  1.35 mg/dL   Calcium 8.7 8.4 - 10.5 mg/dL   Total Protein 7.6 6.0 - 8.3 g/dL   Albumin 3.1 (L) 3.5 - 5.2 g/dL   AST 25 0 - 37 U/L   ALT 22 0 - 53 U/L   Alkaline Phosphatase 66 39 - 117 U/L   Total Bilirubin 0.3 0.3 - 1.2 mg/dL   GFR calc non Af Amer 75 (L) >90 mL/min   GFR calc Af Amer 87 (L) >90 mL/min    Comment: (NOTE) The eGFR has been calculated using the CKD EPI equation. This calculation has not been validated in all clinical situations. eGFR's persistently <90 mL/min signify possible Chronic Kidney Disease.    Anion gap 5 5 - 15     HEENT: normal Cardio: RRR and no murmur Resp: CTA B/L and Unlabored GI: BS positive and nontender nondistended Extremity:  Pulses positive and No Edema Skin:   Intact Neuro: Confused, Abnormal Sensory decreased left L5 dermatome and Abnormal Motor 3 minus left biceps triceps grip deltoid, 4/5 left hip  flexor and knee extensor and ankle dorsiflexor Musc/Skel:  Other tight hamstrings bilaterally Gen. no distress, increased hip adductor tone   Assessment/Plan: 1. 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 Physiatrist is providing close team supervision and 24 hour management of active medical problems listed below. Physiatrist and rehab team continue to assess barriers to discharge/monitor patient progress toward functional and medical goals. FIM: FIM - Bathing Bathing Steps Patient Completed: Chest, Right Arm, Left Arm, Abdomen, Buttocks, Front perineal area, Right upper leg, Left upper leg, Right lower leg (including foot) Bathing: 4: Min-Patient completes 8-9 68f10 parts or 75+ percent  FIM - Upper Body Dressing/Undressing Upper body dressing/undressing steps patient completed: Put head through opening of pull over shirt/dress, Pull shirt over trunk, Thread/unthread right sleeve of pullover shirt/dresss,  Thread/unthread left sleeve of pullover shirt/dress Upper body dressing/undressing: 5: Set-up assist to: Obtain clothing/put away FIM - Lower Body Dressing/Undressing Lower body dressing/undressing steps patient completed: Pull underwear up/down, Pull pants up/down, Thread/unthread right pants leg, Thread/unthread left pants leg, Fasten/unfasten pants, Don/Doff left sock, Don/Doff right sock Lower body dressing/undressing: 3: Mod-Patient completed 50-74% of tasks  FIM - Toileting Toileting steps completed by patient: Performs perineal hygiene Toileting: 2: Max-Patient completed 1 of 3 steps  FIM - TRadio producerDevices: Grab bars Toilet Transfers: 4-From toilet/BSC: Min A (steadying Pt. > 75%), 4-To toilet/BSC: Min A (steadying Pt. > 75%)  FIM - BControl and instrumentation engineerDevices: Arm rests, WCopy 4: Chair or W/C > Bed: Min A (steadying Pt. > 75%), 4: Bed > Chair or W/C: Min A (steadying Pt. > 75%), 5: Sit > Supine: Supervision (verbal cues/safety issues)  FIM - Locomotion: Wheelchair Distance: 20 Locomotion: Wheelchair: 4: Travels 150 ft or more: maneuvers on rugs and over door sillls with minimal assistance (Pt.>75%) FIM - Locomotion: Ambulation Locomotion: Ambulation Assistive Devices: Other (comment) (weighted grocery cart) Ambulation/Gait Assistance: 3: Mod assist, 2: Max assist Locomotion: Ambulation: 1: Travels less than 50 ft with maximal assistance (Pt: 25 - 49%)  Comprehension Comprehension Mode: Auditory Comprehension: 4-Understands basic 75 - 89% of the time/requires cueing 10 - 24% of the time  Expression Expression Mode: Verbal Expression: 4-Expresses basic 75 - 89% of the time/requires cueing 10 - 24% of the time. Needs helper to occlude trach/needs to repeat words.  Social Interaction Social Interaction: 3-Interacts appropriately 50 - 74% of the time - May be physically or verbally  inappropriate.  Problem Solving Problem Solving: 3-Solves basic 50 - 74% of the time/requires cueing 25 - 49% of the time  Memory Memory: 3-Recognizes or recalls 50 - 74% of the time/requires cueing 25 - 49% of the time  Medical Problem List and Plan: 1. 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 DVT Prophylaxis/Anticoagulation: SCDs. Monitor for any signs of DVT 3. Pain Management: Tylenol as needed 4. History of CVA 2 with hypercoagulable state. Coumadin on hold due to subarachnoid hemorrhage. 5. Neuropsych: This patient is capable of making decisions on his own behalf. 6. Skin/Wound Care: Routine skin checks 7. Fluids/Electrolytes/Nutrition: Strict I and O follow-up chemistries 8. Hypertension. Nimotop protocol to end 03/13/2014. Monitor with increased mobility 9. Dysphagia. Dysphagia 2 thin liquid diet. Monitor for any aspiration. Follow-up speech therapy 10. MRSA PCR screening positive. Contact precautions 11. Thrush: nystatin swish and swallow  LOS (Days) 3 A  FACE TO FACE EVALUATION WAS PERFORMED  Mikalyn Hermida E 03/06/2014, 8:59 AM

## 2014-03-07 ENCOUNTER — Inpatient Hospital Stay (HOSPITAL_COMMUNITY): Payer: Medicare Other | Admitting: *Deleted

## 2014-03-07 ENCOUNTER — Inpatient Hospital Stay (HOSPITAL_COMMUNITY): Payer: Medicare Other | Admitting: Speech Pathology

## 2014-03-07 ENCOUNTER — Inpatient Hospital Stay (HOSPITAL_COMMUNITY): Payer: Medicare Other | Admitting: Occupational Therapy

## 2014-03-07 NOTE — Progress Notes (Signed)
Patient ID: Brian [name], male   DOB: [birth year], 69 y.o.   MRN: [id number]  Subjective/Complaints:  No problems in therapy, needs Sup for meals No new issues overnite, poor sleep Review of Systems - Negative except poor appetite  Objective: Vital Signs: Blood pressure 113/74, pulse 81, temperature 97.9 F (36.6 C), temperature source Oral, resp. rate 18, height 5\' 7"  (1.702 m), weight 65.137 kg (143 lb 9.6 oz), SpO2 100 %. No results found. No results found for this or any previous visit (from the past 72 hour(s)).   HEENT: normal Cardio: RRR and no murmur Resp: CTA B/L and Unlabored GI: BS positive and nontender nondistended Extremity:  Pulses positive and No Edema Skin:   Intact Neuro: Confused, Abnormal Sensory decreased left L5 dermatome and Abnormal Motor 3 minus left biceps triceps grip deltoid, 4/5 left hip flexor and knee extensor and ankle dorsiflexor Musc/Skel:  Other tight hamstrings bilaterally Gen. no distress, increased hip adductor tone   Assessment/Plan: 1. 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 Physiatrist is providing close team supervision and 24 hour management of active medical problems listed below. Physiatrist and rehab team continue to assess barriers to discharge/monitor patient progress toward functional and medical goals. FIM: FIM - Bathing Bathing Steps Patient Completed: Chest, Right Arm, Left Arm, Abdomen, Buttocks, Front perineal area, Right upper leg, Left upper leg, Right lower leg (including foot) Bathing: 4: Min-Patient completes 8-9 26f 10 parts or 75+ percent  FIM - Upper Body Dressing/Undressing Upper body dressing/undressing steps patient completed: Put head through opening of pull over shirt/dress, Pull shirt over trunk, Thread/unthread left sleeve of pullover shirt/dress Upper body dressing/undressing: 4:  Min-Patient completed 75 plus % of tasks FIM - Lower Body Dressing/Undressing Lower body dressing/undressing steps patient completed: Pull underwear up/down, Pull pants up/down, Thread/unthread right pants leg, Thread/unthread left pants leg, Don/Doff right sock, Thread/unthread right underwear leg, Thread/unthread left underwear leg, Don/Doff right shoe, Fasten/unfasten right shoe Lower body dressing/undressing: 3: Mod-Patient completed 50-74% of tasks  FIM - Toileting Toileting steps completed by patient: Adjust clothing prior to toileting, Performs perineal hygiene, Adjust clothing after toileting Toileting: 4: Steadying assist  FIM - Radio producer Devices: Grab bars Toilet Transfers: 4-From toilet/BSC: Min A (steadying Pt. > 75%), 4-To toilet/BSC: Min A (steadying Pt. > 75%)  FIM - Bed/Chair Transfer Bed/Chair Transfer Assistive Devices: Bed rails Bed/Chair Transfer: 4: Bed > Chair or W/C: Min A (steadying Pt. > 75%), 4: Chair or W/C > Bed: Min A (steadying Pt. > 75%), 4: Supine > Sit: Min A (steadying Pt. > 75%/lift 1 leg), 4: Sit > Supine: Min A (steadying pt. > 75%/lift 1 leg)  FIM - Locomotion: Wheelchair Distance: 20 Locomotion: Wheelchair: 4: Travels 150 ft or more: maneuvers on rugs and over door sillls with minimal assistance (Pt.>75%) FIM - Locomotion: Ambulation Locomotion: Ambulation Assistive Devices: Administrator Ambulation/Gait Assistance: 3: Mod assist Locomotion: Ambulation: 2: Travels 50 - 149 ft with moderate assistance (Pt: 50 - 74%)  Comprehension Comprehension Mode: Auditory Comprehension: 4-Understands basic 75 - 89% of the time/requires cueing 10 - 24% of the time  Expression Expression Mode: Verbal Expression: 4-Expresses basic 75 - 89% of the time/requires cueing 10 - 24% of the time. Needs helper to occlude trach/needs to repeat words.  Social Interaction Social Interaction: 4-Interacts appropriately 75 - 89% of the  time - Needs redirection for appropriate  language or to initiate interaction.  Problem Solving Problem Solving: 3-Solves basic 50 - 74% of the time/requires cueing 25 - 49% of the time  Memory Memory: 3-Recognizes or recalls 50 - 74% of the time/requires cueing 25 - 49% of the time  Medical Problem List and Plan: 1. 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 DVT Prophylaxis/Anticoagulation: SCDs. Monitor for any signs of DVT 3. Pain Management: Tylenol as needed 4. History of CVA 2 with hypercoagulable state. Coumadin on hold due to subarachnoid hemorrhage. 5. Neuropsych: This patient is capable of making decisions on his own behalf. 6. Skin/Wound Care: Routine skin checks 7. Fluids/Electrolytes/Nutrition: Strict I and O follow-up chemistries 8. Hypertension. Nimotop protocol to end 03/13/2014. Monitor with increased mobility 9. Dysphagia. Dysphagia 2 thin liquid diet. Monitor for any aspiration. Follow-up speech therapy 10. MRSA PCR screening positive. Contact precautions 11. Thrush: nystatin swish and swallow  LOS (Days) 4 A FACE TO FACE EVALUATION WAS PERFORMED  Brian [name] 03/07/2014, 8:33 AM

## 2014-03-07 NOTE — Progress Notes (Signed)
Occupational Therapy Session Note  Patient Details  Name: Brian [name] MRN: [id number] Date of Birth: [birth year]  Today's Date: 03/07/2014 OT Individual Time: 6606-0045 OT Individual Time Calculation (min): 60 min    Short Term Goals: Week 1:  OT Short Term Goal 1 (Week 1): Pt will transfer to and from toilet to w/c with supervision. OT Short Term Goal 2 (Week 1): Pt will toilet with min A. OT Short Term Goal 3 (Week 1): Pt will be set up with UB dressing. OT Short Term Goal 4 (Week 1): Pt will stand with steadying A to don pants over hips. OT Short Term Goal 5 (Week 1): Pt will be able to don pants over feet and socks with extra time.   Skilled Therapeutic Interventions/Progress Updates:    Pt received in room in w/c. Pt declined bathing and stated he was assisted earlier with clean clothing. Pt declined any further A with self care. Pt stated he was too tired to attend therapy, but finally agreed. Pt taken to gym to work on his LUE coordination, visual tracking to the left, LUE AROM, and balance activities. Pt transferred to the mat with min A and then worked on sit >< stand 3x and then c/o fatigue. He worked with 1# dowel bar to develop shoulder AROM, grasping and reaching with the cones. He worked on [employer] and placing in a Merchandiser, retail for [employer] four game. He worked on tracking and Occupational psychologist with the game. He needed a few cues to follow game instructions and then engaged well. Pt taken to his room to transfer to the bed. Pt applied lotion all of his body using BUE.  Pt adjusted in bed with bed alarm, call light, and phone in reach.  Therapy Documentation Precautions:  Precautions Precautions: Fall Precaution Comments: L hemi Restrictions Weight Bearing Restrictions: No      Pain:  no c/o pain ADL: ADL ADL Comments: Refer to FIM  See FIM for current functional status  Therapy/Group: Individual Therapy  [place] 03/07/2014,  1:23 PM

## 2014-03-07 NOTE — Progress Notes (Signed)
Speech Language Pathology Daily Session Note  Patient Details  Name: Brian [name] MRN: [id number] Date of Birth: 02-24-1945  Today's Date: 03/07/2014 SLP Individual Time: 1410-1515 SLP Individual Time Calculation (min): 65 min  Short Term Goals: Week 1: SLP Short Term Goal 1 (Week 1): Pt will tolerate presentations of his currently prescribed diet with no overt s/s of aspiration and supervision cues for use of swallowing precautions.  SLP Short Term Goal 2 (Week 1): Pt will improve mastication of dys 3 textures over 2-3 consecutive sessions with min-supervision cues to monitor left pocketing  SLP Short Term Goal 3 (Week 1): Pt will improve basic functional problem solving over 75% of observable opportunities with mod assist multimodal cues.  SLP Short Term Goal 4 (Week 1): Pt will improve recall of basic, daily information for 75% accuracy with mod assist multimodal cues for use of external aids.  SLP Short Term Goal 5 (Week 1): Pt will sustain his attention during functional tasks for 5-7 minutes with min-mod cues for redirection.   Skilled Therapeutic Interventions:  Pt was seen for skilled ST targeting cognitive goals.  Upon arrival, pt was reclined in bed, lethargic, but agreeable to participate in ST with min encouragement.  SLP facilitated the session with a semi-complex new learning activity targeting planning, thought organization, and mental flexibility.  During the abovementioned task, pt required overall mod assist verbal cues for functional problem solving over ~75% of opportunities.  Continue per current plan of care.    FIM:  Comprehension Comprehension Mode: Auditory Comprehension: 4-Understands basic 75 - 89% of the time/requires cueing 10 - 24% of the time Expression Expression Mode: Verbal Expression: 4-Expresses basic 75 - 89% of the time/requires cueing 10 - 24% of the time. Needs helper to occlude trach/needs to repeat words. Social Interaction Social Interaction:  4-Interacts appropriately 75 - 89% of the time - Needs redirection for appropriate language or to initiate interaction. Problem Solving Problem Solving: 3-Solves basic 50 - 74% of the time/requires cueing 25 - 49% of the time Memory Memory: 3-Recognizes or recalls 50 - 74% of the time/requires cueing 25 - 49% of the time  Pain Pain Assessment Pain Assessment: No/denies pain  Therapy/Group: Individual Therapy  [place], Selinda Orion 03/07/2014, 6:23 PM

## 2014-03-07 NOTE — Progress Notes (Signed)
Physical Therapy Session Note  Patient Details  Name: Brian [name] MRN: [id number] Date of Birth: [birth year]  Today's Date: 03/07/2014 PT Individual Time: 0800-0904 PT Individual Time Calculation (min): 64 min   Short Term Goals: Week 1:  PT Short Term Goal 1 (Week 1): Pt will perform all basic transfers with S PT Short Term Goal 2 (Week 1): Pt will be able to reach 10" outside BOS with Min A for functional reaching PT Short Term Goal 3 (Week 1): Pt will performed WC mobility x100' with 1 cue for L attention PT Short Term Goal 4 (Week 1): Pt will ambulate 100' with RW and Min A PT Short Term Goal 5 (Week 1): Pt will ascend/descend 5 stairs with 2 rails and Min A   Skilled Therapeutic Interventions/Progress Updates:  First tx focused on functional mobility training, gait with RW, Berg Balance test, and NMR via forced use, manual facilitation, and multi-modal cues. Pt reporting exhaustion today, but agreeable to PT with rest breaks. Pt educated on healing process, CVA risk reduction, and principles of rehab. Pt continues to be limited by L-sided inattention.  Pt up EOB, performed sit<>stand transfers with Min A overall to RW. Toilet transfer with Min A and safety cues for L-sided attention to obstacles. Performed remaining basic transfers throughout tx with Min A, but up to Mod A in more complicated/high balance situations. Pt continues to demo need for supporting self with back of LEs against surfaces as well as heavy UE reliance for controlling descent.   Perfomred gait training in room x20' and in controlled setting x150' with RW and up to Mod A for steadying, especially during turns. AFO not used this morning as ongoing assessment for need. Gait quality continues to be limited by forward flexed posture, decreased hip/knee ext, and bil scissoring. Pt able to adjust gait pattern ~50% with max verbal cues and manual facilitation, but degrades quickly with fatigue.   NMR performed throughout  via verbal cues, and manual facilitation for trunk control and weight shifting. Performed unsupported sitting balance during functional tasks as well as chest fly zipline game for trunk ext and scapular retraction. Good bil UE use noted.    Berg balance test administered, see below for details. Pt had most difficulty with all tasks involving SLS and dynamic trunk movements. Discussed fall risk reduction and AD use at home. Score indicating high risk of falls.      Therapy Documentation Precautions:  Precautions Precautions: Fall Precaution Comments: L hemi Restrictions Weight Bearing Restrictions: No    Vital Signs: Therapy Vitals BP: 112/79 mmHg Patient Position (if appropriate): Sitting Pain: none       Balance: Berg Balance Test Sit to Stand: Needs minimal aid to stand or to stabilize Standing Unsupported: Needs several tries to stand 30 seconds unsupported Sitting with Back Unsupported but Feet Supported on Floor or Stool: Able to sit safely and securely 2 minutes Stand to Sit: Uses backs of legs against chair to control descent Transfers: Needs one person to assist Standing Unsupported with Eyes Closed: Able to stand 3 seconds Standing Ubsupported with Feet Together: Able to place feet together independently but unable to hold for 30 seconds From Standing, Reach Forward with Outstretched Arm: Can reach forward >12 cm safely (5") From Standing Position, Pick up Object from Floor: Able to pick up shoe, needs supervision From Standing Position, Turn to Look Behind Over each Shoulder: Needs assist to keep from losing balance and falling Turn 360 Degrees: Needs assistance while  turning Standing Unsupported, Alternately Place Feet on Step/Stool: Needs assistance to keep from falling or unable to try Standing Unsupported, One Foot in Front: Needs help to step but can hold 15 seconds Standing on One Leg: Unable to try or needs assist to prevent fall Total Score: 20  See FIM for  current functional status  Therapy/Group: Individual Therapy  Kennieth Rad, PT, DPT  03/07/2014, 9:27 AM

## 2014-03-08 ENCOUNTER — Encounter (HOSPITAL_COMMUNITY): Payer: Medicare Other | Admitting: Occupational Therapy

## 2014-03-08 NOTE — Progress Notes (Signed)
Occupational Therapy Session Note  Patient Details  Name: Brian [name] MRN: [id number] Date of Birth: 18-Feb-1946  Today's Date: 03/08/2014 OT Individual Time: 1030-1100 OT Individual Time Calculation (min): 30 min    Short Term Goals: Week 1:  OT Short Term Goal 1 (Week 1): Pt will transfer to and from toilet to w/c with supervision. OT Short Term Goal 2 (Week 1): Pt will toilet with min A. OT Short Term Goal 3 (Week 1): Pt will be set up with UB dressing. OT Short Term Goal 4 (Week 1): Pt will stand with steadying A to don pants over hips. OT Short Term Goal 5 (Week 1): Pt will be able to don pants over feet and socks with extra time.   Skilled Therapeutic Interventions/Progress Updates:    1:1 self care retraining to include bed mobility, basic stand pivot transfers bed<w/c><toilet with min A, toileting with steady A and extra time to use left UE.  Stood at sink to wash periarea and arm pits with steady A with cuing for feet positioning. Pt donned clothing with min A with extra time and encouragement for left UE use. Transferred back to recliner to rest at end of session. Pt hard on himself about his progress and about being in the hospital- provided reassurance and goals of therapy.    Therapy Documentation Precautions:  Precautions Precautions: Fall Precaution Comments: L hemi Restrictions Weight Bearing Restrictions: No Pain:  no c/o pain just fatigue ADL: ADL ADL Comments: Refer to FIM  See FIM for current functional status  Therapy/Group: Individual Therapy  Willeen Cass [facility] 03/08/2014, 12:57 PM

## 2014-03-08 NOTE — Progress Notes (Signed)
Patient ID: Brian [name], male   DOB: May 18, 1945, 69 y.o.   MRN: [id number]   Patient ID: Brian [name], male   DOB: [birth year], 69 y.o.   MRN: [id number]   03/08/14. 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  Doing well; no c/os; anxious for d/c. Review of Systems - Negative except poor appetite   Past Medical History  Diagnosis Date  . Hypertension   . CVA (cerebral infarction)   . 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 Renal disorder   . Sepsis(995.91)   . Vascular insufficiency   . Stroke    Patient Vitals for the past 24 hrs:  BP Temp Temp src Pulse Resp SpO2  03/08/14 0539 130/80 mmHg 98.6 F (37 C) Oral 81 18 99 %  03/07/14 1758 102/80 mmHg - - 80 - -  03/07/14 1416 108/79 mmHg 98 F (36.7 C) Oral 79 18 99 %     Intake/Output Summary (Last 24 hours) at 03/08/14 0959 Last data filed at 03/08/14 0800  Gross per 24 hour  Intake   1320 ml  Output   1000 ml  Net    320 ml     Objective: Vital Signs: Blood pressure 130/80, pulse 81, temperature 98.6 F (37 C), temperature source Oral, resp. rate 18, height 5\' 7"  (1.702 m), weight 65.137 kg (143 lb 9.6 oz), SpO2 99 %. No results found. No results found for this or any previous visit (from the past 72 hour(s)).   HEENT: normal Cardio: RRR and no murmur Resp: CTA B/L and Unlabored GI: BS positive and nontender nondistended Extremity:  Pulses positive and No Edema Skin:   Intact Neuro: Confused, Abnormal Sensory decreased left L5 dermatome and Abnormal Motor 3 minus left biceps triceps grip deltoid, 4/5 left hip flexor and knee extensor and ankle dorsiflexor Musc/Skel:  Other tight hamstrings bilaterally Gen. no distress, increased hip adductor tone    Medical Problem List and Plan: 1. 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 DVT Prophylaxis/Anticoagulation: SCDs. Monitor for any signs of DVT  3. History of CVA 2 with hypercoagulable state. Coumadin on hold due to subarachnoid hemorrhage. 4.  Neuropsych: This patient is capable of making decisions on his own behalf.  5. Hypertension. Nimotop protocol to end 03/13/2014. Monitor with increased mobility 6. Dysphagia. Dysphagia 2 thin liquid diet. Monitor for any aspiration. Follow-up speech therapy 7. MRSA PCR screening positive. Contact precautions 8. Thrush: nystatin swish and swallow  LOS (Days) 5 A FACE TO FACE EVALUATION WAS PERFORMED  Nyoka Cowden 03/08/2014, 9:57 AM

## 2014-03-09 ENCOUNTER — Inpatient Hospital Stay (HOSPITAL_COMMUNITY): Payer: Medicare Other | Admitting: Physical Therapy

## 2014-03-09 ENCOUNTER — Inpatient Hospital Stay (HOSPITAL_COMMUNITY): Payer: Medicare Other | Admitting: *Deleted

## 2014-03-09 NOTE — Progress Notes (Signed)
Patient ID: Brian [name], male   DOB: 22-Sep-1945, 69 y.o.   MRN: [id number]   Patient ID: Brian [name], male   DOB: Mar 05, 1945, 69 y.o.   MRN: [id number]   Patient ID: Brian [name], male   DOB: 20-Apr-1945, 69 y.o.   MRN: [id number]   03/09/14. 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  Doing well; no c/os; anxious for d/c. Review of Systems - Negative except poor appetite   Past Medical History  Diagnosis Date  . Hypertension   . CVA (cerebral infarction)   . 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 Renal disorder   . Sepsis(995.91)   . Vascular insufficiency   . Stroke    Patient Vitals for the past 24 hrs:  BP Temp Temp src Pulse Resp SpO2  03/09/14 0525 128/80 mmHg 98.3 F (36.8 C) Oral 76 18 100 %  03/08/14 1755 109/73 mmHg - - - - -  03/08/14 1433 109/82 mmHg 98.2 F (36.8 C) Oral 82 18 99 %  03/08/14 1046 (!) 126/95 mmHg - - - - -     Intake/Output Summary (Last 24 hours) at 03/09/14 0912 Last data filed at 03/09/14 0500  Gross per 24 hour  Intake    600 ml  Output   1200 ml  Net   -600 ml     Objective: Vital Signs: Blood pressure 128/80, pulse 76, temperature 98.3 F (36.8 C), temperature source Oral, resp. rate 18, height 5\' 7"  (1.702 m), weight 65.137 kg (143 lb 9.6 oz), SpO2 100 %. No results found. No results found for this or any previous visit (from the past 72 hour(s)).   HEENT: normal Cardio: RRR and no murmur Resp: CTA B/L and Unlabored GI: BS positive and nontender nondistended Extremity:  Pulses positive and No Edema; SCDs in place Skin:   Intact Neuro: alert  Musc/Skel:  Other tight hamstrings bilaterally Gen. no distress, no c/os    Medical Problem List and Plan: 1. 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 DVT  Prophylaxis/Anticoagulation: SCDs. Monitor for any signs of DVT  3. History of CVA 2 with hypercoagulable state. Coumadin on hold due to subarachnoid hemorrhage. 4.  Neuropsych: This patient is capable of making decisions on his own behalf.  5. Hypertension. Nimotop protocol to end 03/13/2014. Monitor with increased mobility 6. Dysphagia. Dysphagia 2 thin liquid diet. Monitor for any aspiration. Follow-up speech therapy 7. MRSA PCR screening positive. Contact precautions   LOS (Days) 6 A FACE TO FACE EVALUATION WAS PERFORMED  Brian [name] 03/09/2014, 9:12 AM

## 2014-03-09 NOTE — Progress Notes (Signed)
Occupational Therapy Session Note  Patient Details  Name: Brian [name] MRN: [id number] Date of Birth: [birth year]  Today's Date: 03/09/2014 OT Individual Time:  -   0800-0900  (60 min)  1st session                                         1615-1445  (30 min)  2nd session  Short Term Goals: Week 1:  OT Short Term Goal 1 (Week 1): Pt will transfer to and from toilet to w/c with supervision. OT Short Term Goal 2 (Week 1): Pt will toilet with min A. OT Short Term Goal 3 (Week 1): Pt will be set up with UB dressing. OT Short Term Goal 4 (Week 1): Pt will stand with steadying A to don pants over hips. OT Short Term Goal 5 (Week 1): Pt will be able to don pants over feet and socks with extra time.  :     Skilled Therapeutic Interventions/Progress Updates:    1st session:  Pt. Lying in bed.  Addressed bed mobility, stand pivot transfers >bed>reclinder>wc>shower seat with min A.  Pt. Able to set up breakfast tray with  A with milk  only.  Showered on shower seat with SBA and min A for feet.   Pt donned clothing with min A with extra time and encouragement for left UE use. Educated pt on sit to stand and standing balance when in shower.  Asked him to make sure he calls for SBA during these movements.   Transferred back to recliner to rest at end of session.  Left with all items in reach.    Therapy Documentation Precautions:  Precautions Precautions: Fall Precaution Comments: L hemi Restrictions Weight Bearing Restrictions: No General:     Pain:  None 1st session             None  2nd session   ADL: ADL ADL Comments: Refer to FIM    Other Treatments:  2nd session:   Individual session  Engaged in functional mobility,  LUE NMRE, standing balance.  Pt ambulated around room with RW.  Provided cues for left foot placement.  Stood at sink for 5 minutes for grooming with SBA.  Performed LUE AAROM with minimal verbal cues for correct technique and positioning.  Instructed pt to exercise  slowly, and in pain free ranges.  Reiterated sho flex to 90 degrees with good posture, sho abduction 90 degrees with good posture, forearm rotation, wrist flexion/extension, and thumb to finger movements.  Pt. Returned demonstration with minimal verbal cues.  Pt. Left in recliner with safety belt and all needs in reach.     See FIM for current functional status  Therapy/Group: Individual Therapy  Lisa Roca 03/09/2014, 7:59 AM

## 2014-03-09 NOTE — Progress Notes (Signed)
Physical Therapy Session Note  Patient Details  Name: Brian [name] MRN: [id number] Date of Birth: 1946/01/30  Today's Date: 03/09/2014 PT Individual Time: 1400-1430 PT Individual Time Calculation (min): 30 min   Short Term Goals: Week 1:  PT Short Term Goal 1 (Week 1): Pt will perform all basic transfers with S PT Short Term Goal 2 (Week 1): Pt will be able to reach 10" outside BOS with Min A for functional reaching PT Short Term Goal 3 (Week 1): Pt will performed WC mobility x100' with 1 cue for L attention PT Short Term Goal 4 (Week 1): Pt will ambulate 100' with RW and Min A PT Short Term Goal 5 (Week 1): Pt will ascend/descend 5 stairs with 2 rails and Min A   Skilled Therapeutic Interventions/Progress Updates:  Pt was seen bedside in the pm. Pt transferred supine to edge of bed with side rail, head of bed elevated and S. Pt transferred edge of bed to w/c with min A. Pt propelled w/c to gym with B UEs and R LE with S. Pt ambulated with L AFO and rolling walker for distances of 25 and 50 feet x 2 with mod A and verbal cues. Pt propelled w/c back to room with B UEs and R LE with S. Pt transferred w/c to recliner with min A.   Therapy Documentation Precautions:  Precautions Precautions: Fall Precaution Comments: L hemi Restrictions Weight Bearing Restrictions: No General:   Pain: No c/o pain.   Locomotion : Ambulation Ambulation/Gait Assistance: 3: Mod assist   See FIM for current functional status  Therapy/Group: Individual Therapy  Dub Amis 03/09/2014, 3:12 PM

## 2014-03-09 NOTE — Progress Notes (Signed)
Physical Therapy Session Note  Patient Details  Name: Brian [name] MRN: [id number] Date of Birth: 1945/09/24  Today's Date: 03/09/2014 PT Individual Time: 0900-1000 PT Individual Time Calculation (min): 60 min   Short Term Goals: Week 1:  PT Short Term Goal 1 (Week 1): Pt will perform all basic transfers with S PT Short Term Goal 2 (Week 1): Pt will be able to reach 10" outside BOS with Min A for functional reaching PT Short Term Goal 3 (Week 1): Pt will performed WC mobility x100' with 1 cue for L attention PT Short Term Goal 4 (Week 1): Pt will ambulate 100' with RW and Min A PT Short Term Goal 5 (Week 1): Pt will ascend/descend 5 stairs with 2 rails and Min A   Skilled Therapeutic Interventions/Progress Updates:  Pt was seen bedside in the am following OT treatment. Pt propelled w/c with B LEs and R UE about 25 feet, then switched to R UE and LE for about 50 feet with S and verbal cues. Pt ambulated with rolling walker and L AFO for distances of 20 and 70 feet with mod A and verbal cues. Treatment focused on LE strengthening and NMR in parallel bars including LE exercises, unilateral stance, weighting and side stepping. Pt propellled w/c about 150 feet with R UE and R LE with S and verbal cues. Pt transferred w/c to edge of bed with mod A and verbal cues. Pt transferred edge of bed to supine with min A.   Therapy Documentation Precautions:  Precautions Precautions: Fall Precaution Comments: L hemi Restrictions Weight Bearing Restrictions: No General:   Pain: No c/o pain.   Locomotion : Ambulation Ambulation/Gait Assistance: 3: Mod assist   See FIM for current functional status  Therapy/Group: Individual Therapy  Dub Amis 03/09/2014, 12:30 PM

## 2014-03-10 ENCOUNTER — Encounter (HOSPITAL_COMMUNITY): Payer: Medicare Other

## 2014-03-10 ENCOUNTER — Inpatient Hospital Stay (HOSPITAL_COMMUNITY): Payer: Medicare Other

## 2014-03-10 ENCOUNTER — Inpatient Hospital Stay (HOSPITAL_COMMUNITY): Payer: Medicare Other | Admitting: Speech Pathology

## 2014-03-10 NOTE — Progress Notes (Signed)
Occupational Therapy Session Note  Patient Details  Name: Brian [name] MRN: [id number] Date of Birth: 12-Jul-1945  Today's Date: 03/10/2014 OT Individual Time: 1300-1400 OT Individual Time Calculation (min): 60 min    Short Term Goals: Week 1:  OT Short Term Goal 1 (Week 1): Pt will transfer to and from toilet to w/c with supervision. OT Short Term Goal 2 (Week 1): Pt will toilet with min A. OT Short Term Goal 3 (Week 1): Pt will be set up with UB dressing. OT Short Term Goal 4 (Week 1): Pt will stand with steadying A to don pants over hips. OT Short Term Goal 5 (Week 1): Pt will be able to don pants over feet and socks with extra time.   Skilled Therapeutic Interventions/Progress Updates:    Pt engaged in BADL retraining including bathing at shower level and dressing with sit<>stand from w/c at sink.  Pt used LUE during bathing and dressing tasks at diminished level.  Pt initiated use of LUE independently.  Pt required min verbal cues for LLE positioning prior to sit<>stand and transitional movements. Pt did not attempt donning socks and shoes, stating that "the shower wore me out." Pt required steady A for standing balance.  Focus on activity tolerance, functional transfers, sit<>stand, standing balance, attention to left, LUE use, and safety awareness.  Therapy Documentation Precautions:  Precautions Precautions: Fall Precaution Comments: L hemi Restrictions Weight Bearing Restrictions: No Pain: Pain Assessment Pain Assessment: No/denies pain  See FIM for current functional status  Therapy/Group: Individual Therapy  Leroy Libman 03/10/2014, 2:47 PM

## 2014-03-10 NOTE — Consult Note (Signed)
NEUROBEHAVIORAL STATUS EXAM - [facility]IDENTIAL [facility]   MEDICAL NECESSITY:  Mr. Brian [name] was seen on the [facility] for a neurobehavioral status exam owing to the patient's diagnosis of subarachnoid hemorrhage, and to assist in treatment planning during admission.   According to medical records, Mr. 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 Daiwik Buffalo is a "69 y.o. right handed male with history of tobacco abuse, hypertension, CVA 2 as well as hypercoagulable state maintained on chronic Coumadin. As reported patient had recently ran out of his medication for the past week. He lives with his son [and] used a walker prior to admission and has a home health aide 2-3 hours per day. Presented to [facility] 02/19/2014 with headache and neck pain. Blood pressure 161/113. INR upon admission of 1.48. 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 Patient was transferred to [facility] for ongoing evaluation. Cerebral angiogram showed a 5.8 mm right ICA terminus aneurysm and underwent coiling.Reported to be impulsive with poor safety awareness.   During today's visit, Mr. Sobieski reported suffering from mild memory loss and inattention post SAH that he feels are improving. No other cognitive symptoms endorsed, though he purportedly experiences intermittent poor balance and he said he feels "hungry all the time."    From an emotional standpoint, Mr. Nikolai said that his mood "ain't the best." He wants to discharge home ASAP and start adjusting to being there. He also expressed trepidation about reinjuring himself while participating in therapy. He reportedly has no history of mental illness.  He did suffer a prolonged period of depression the last time he was hospitalized. He is presently not taking an antidepressant. No adjustment issues endorsed. Suicidal/homicidal ideation, plan or intent was denied. No manic or hypomanic episodes were reported. The patient denied ever experiencing any auditory/visual hallucinations. No major behavioral or personality changes were endorsed.  Mr. Kitchings described rehab as "terrible" only because it is quite painful. However, he believes that he is making great progress and he said that staff has been excellent.  No major barriers to therapy identified, though he said that his energy level is quite low and that he needs time to recuperate between appointments. He feels that he presently does have ample time to recover. Mr. Hun reported that his daughter recently came to visit him. He has adequate social support in general. He plans to return home to his apartment upon discharge with his 67 year old son. He feels comfortable returning to this situation as his son is capable of helping him transition home.   PROCEDURES ADMINISTERED: [2 units 97673] Diagnostic clinical interview  Review of available records Mental Status Exam-2 (brief version)  MENTAL STATUS: Mr. Tuggle mental status exam score of 13/16 is below expectation but above the cutoff used to indicate severe cognitive impairment or dementia. He was able to immediately register 3 words into memory, but could only freely recall 2 of them after a very short delay. He also made two false positive errors. He was generally oriented to time but he needed to look at the calendar on the wall to get many of the answers correct. He was oriented to place (except floor of the building). He was fully oriented to person and situation.   Behavioral Evaluation:  Mr. Jutras was appropriately dressed for season and situation. Normal posture was noted. He was friendly and rapport was adequately established.  His speech was as expected and he was able to express ideas effectively. He seemed to understand test directions readily. His affect was blunted and he appeared fatigued and somewhat depressed. Attention and motivation were adequate. Optimal test taking conditions were maintained.   IMPRESSION: Overall, Mr. Macmurray reported suffering from mild cognitive difficulties that he feels are improving. He also endorsed mild depressive symptoms in reaction to his present medical situation but he appears to be adjusting well to this admission. He appears to have adequate social support. During this visit, I spent time normalizing his feelings and discussing cognitive evaluation and how this might be helpful to the rehab team. I recommend further neuropsychological screen to better assess cognitive strengths and challenges. This will be scheduled with Dr. Beverly Gust this Thursday or next Monday with me.    RECOMMENDATIONS  Recommendations for treatment team:  . Since mild emotional factors and low energy are reported, consider implementing an antidepressant. Fluoxetine might be a good choice given his presentation.  . Complete more thorough neuropsychological screen. I am happy to see him next Monday or Dr. Beverly Gust can test him this Thursday.   DIAGNOSES:  Subarachnoid hemorrhage Cognitive disorder, NOS (provisional) Adjustment disorder with depressed mood (mild)   Rutha Bouchard, Psy.D.  Clinical Neuropsychologist

## 2014-03-10 NOTE — Progress Notes (Signed)
Physical Therapy Note  Patient Details  Name: Brian [name] MRN: [id number] Date of Birth: April 25, 1945 Today's Date: 03/10/2014  This PT reviewed and agrees with session note dated 03/10/14, time 3:43 PM by Schuyler Amor, SPT.  Brian [name] 03/10/2014, 5:32 PM

## 2014-03-10 NOTE — Progress Notes (Signed)
Physical Therapy Session Note  Patient Details  Name: Brian [name] MRN: [id number] Date of Birth: 07/13/1945  Today's Date: 03/10/2014 PT Individual Time: 2641-5830 PT Individual Time Calculation (min): 30 min   Short Term Goals: Week 1:  PT Short Term Goal 1 (Week 1): Pt will perform all basic transfers with S PT Short Term Goal 2 (Week 1): Pt will be able to reach 10" outside BOS with Min A for functional reaching PT Short Term Goal 3 (Week 1): Pt will performed WC mobility x100' with 1 cue for L attention PT Short Term Goal 4 (Week 1): Pt will ambulate 100' with RW and Min A PT Short Term Goal 5 (Week 1): Pt will ascend/descend 5 stairs with 2 rails and Min A   Skilled Therapeutic Interventions/Progress Updates:   Pt was seated in w/c at start of therapy. Pt reported that he was exhausted from his shower. PT session focused on neuro-muscular re-education techniques for attention to L in mobility tasks, including stance stability, dynamic standing and gait.  Pt maintained dynamic standing with card sorting activity at high-low table to encourage L attention for 2 bouts; 1 min 45 sec and 1 min 30 seconds. Yoga block between feet to widen BOS for standing balance, = weight bearing of LEs. Second bout, 1 in. platform used under R foot to bias weight on LLE.    Pt performed gait 8' with RW (14' x 4 repetitions, turning to L to repeat) with Mod A for L weight shift, cues for posture and technique. Gait training over 2x4 board to cue patient for wider base of support. Pt performed stairs ascending/descending 5 steps with 2 hand rails with Min A and mod cues for sequencing and technique.   Pt was returned to room and opted to sit up in w/c with quick release belt and reminder to use call bell and call nurse for help to return to bed. PT informed NT that patient would need to be returned to bed soon for rest.  Upright activity limited by back pain, LE spasticity causing scissoring of LLE, and  gross B hip instability with marked Trendelenberg pattern. PT recommends that his daily schedule include frequent naps/rest breaks to better reflect his PLOF.  Therapy Documentation Precautions:  Precautions Precautions: Fall Precaution Comments: L hemi Restrictions Weight Bearing Restrictions: No  Pain: No c/o pain.  Locomotion : Ambulation Ambulation/Gait Assistance: 3: Mod assist Wheelchair Mobility Distance: 130   See FIM for current functional status  Therapy/Group: Individual Therapy  Blenda Mounts 03/10/2014, 3:43 PM

## 2014-03-10 NOTE — Progress Notes (Signed)
Speech Language Pathology Weekly Progress and Session Note  Patient Details  Name: Brian [name] MRN: [id number] Date of Birth: 05-06-1945  Beginning of progress report period: March 03, 2014 End of progress report period: March 10, 2014  Today's Date: 03/10/2014 SLP Individual Time: 8921-1941 SLP Individual Time Calculation (min): 45 min  Short Term Goals: Week 1: SLP Short Term Goal 1 (Week 1): Pt will tolerate presentations of his currently prescribed diet with no overt s/s of aspiration and supervision cues for use of swallowing precautions.  SLP Short Term Goal 1 - Progress (Week 1): Met SLP Short Term Goal 2 (Week 1): Pt will improve mastication of dys 3 textures over 2-3 consecutive sessions with min-supervision cues to monitor left pocketing  SLP Short Term Goal 2 - Progress (Week 1): Partly met SLP Short Term Goal 3 (Week 1): Pt will improve basic functional problem solving over 75% of observable opportunities with mod assist multimodal cues.  SLP Short Term Goal 3 - Progress (Week 1): Progressing toward goal SLP Short Term Goal 4 (Week 1): Pt will improve recall of basic, daily information for 75% accuracy with mod assist multimodal cues for use of external aids.  SLP Short Term Goal 4 - Progress (Week 1): Progressing toward goal SLP Short Term Goal 5 (Week 1): Pt will sustain his attention during functional tasks for 5-7 minutes with min-mod cues for redirection.  SLP Short Term Goal 5 - Progress (Week 1): Met    New Short Term Goals: Week 2: SLP Short Term Goal 1 (Week 2): Pt will tolerate dys 3 solids and thin liquids during a functional meal with supervision cues for use of swallowing precautions.  SLP Short Term Goal 2 (Week 2): Pt will improve basic functional problem solving over 75% of observable opportunities with mod assist multimodal cues.  SLP Short Term Goal 3 (Week 2): Pt will improve recall of basic, daily information for 75% accuracy with mod assist  multimodal cues for use of external aids.  SLP Short Term Goal 4 (Week 2): Pt will sustain his attention during functional tasks for 15-20 minutes with min-mod cues for redirection.  SLP Short Term Goal 5 (Week 2): Pt will attend to left environment during functional tasks over 75% of observable opportunities with mod assist.    Weekly Progress Updates:  Pt made functional gains this reporting period and has met 3 out of 5 short term goals due to improved mastication during trials of dys 3 solids, improved sustained attention during functional tasks, and improved use of swallowing precautions.  Currently, pt requires overall mod-max assist during cognitive tasks due to decreased recall of new information, left inattention, and decreased functional problem solving.  Additionally, pt requires supervision assist for use of swallowing precautions to minimize overt s/s of aspiration with dys 2 solids and thin liquids.  Pt education is ongoing.  No family has been present for training/education at this time.  Pt would continue to benefit from skilled ST while inpatient in order to maximize functional independence and reduce burden of care upon discharge.  Continue to anticipate that pt will require 24/7 supervision, assistance for medication and financial management, and likely ST follow up at next level of care.     Intensity: Minumum of 1-2 x/day, 30 to 90 minutes Frequency: 5 out of 7 days Duration/Length of Stay: 14 days  Treatment/Interventions: Cognitive remediation/compensation;Cueing hierarchy;Dysphagia/aspiration precaution training;Functional tasks;Patient/family education;Internal/external aids;Environmental controls   Daily Session  Skilled Therapeutic Interventions: Pt was seen for skilled  ST targeting goals for dysphagia and cognition.  Upon arrival, pt was partially reclined in bed, awake, alert, and agreeable to participate in [place].  SLP facilitated the session with trials of dys 3 and  regular solid consistencies to continue working towards diet advancement.  Pt exhibited efficient mastication and timely posterior transit of both textures with minimal residual solids left in the oral cavity post swallow.  Recommend a trial meal tray of dys 3 solids tomorrow to assess toleration of upgraded consistencies and use of swallowing precautions.  SLP provided max faded to mod cues for visual scanning to locate information on the left to use his daily calendar to facilitate improved recall of daily information.  Pt with overall poor carryover of basic information related to his plan of care.  Goals updated on this date to reflect current progress and plan of care.       FIM:  Comprehension Comprehension Mode: Auditory Comprehension: 5-Understands basic 90% of the time/requires cueing < 10% of the time Expression Expression Mode: Verbal Expression: 5-Expresses basic 90% of the time/requires cueing < 10% of the time. Social Interaction Social Interaction: 5-Interacts appropriately 90% of the time - Needs monitoring or encouragement for participation or interaction. Problem Solving Problem Solving: 3-Solves basic 50 - 74% of the time/requires cueing 25 - 49% of the time Memory Memory: 3-Recognizes or recalls 50 - 74% of the time/requires cueing 25 - 49% of the time FIM - Eating Eating Activity: 5: Supervision/cues  Pain Pain Assessment Pain Assessment: No/denies pain  Therapy/Group: Individual Therapy  Peyten Punches, Selinda Orion 03/10/2014, 12:33 PM

## 2014-03-10 NOTE — Plan of Care (Signed)
Problem: RH SAFETY Goal: RH STG ADHERE TO SAFETY PRECAUTIONS W/ASSISTANCE/DEVICE STG Adhere to Safety Precautions With min Assistance/Device.  Outcome: Progressing Poor safety awareness.

## 2014-03-10 NOTE — Plan of Care (Signed)
Problem: RH BLADDER ELIMINATION Goal: RH STG MANAGE BLADDER WITH ASSISTANCE STG Manage Bladder With Assistance. Mod I Outcome: Progressing No incontinent episode

## 2014-03-10 NOTE — Progress Notes (Signed)
Patient ID: Brian [name], male   DOB: 04-28-1945, 69 y.o.   MRN: [id number]  Subjective/Complaints:  Talking with son, per nurse assistant may only need intermittent Sup for meals  No new issues overnite, tolerating therapy program. Review of Systems - Negative except poor appetite  Objective: Vital Signs: Blood pressure 140/82, pulse 78, temperature 97.5 F (36.4 C), temperature source Oral, resp. rate 16, height 5\' 7"  (1.702 m), weight 65.137 kg (143 lb 9.6 oz), SpO2 100 %. No results found. No results found for this or any previous visit (from the past 72 hour(s)).   HEENT: normal Cardio: RRR and no murmur Resp: CTA B/L and Unlabored GI: BS positive and nontender nondistended Extremity:  Pulses positive and No Edema Skin:   Intact Neuro: Confused, Abnormal Sensory decreased left L5 dermatome and Abnormal Motor 3 minus left biceps triceps grip deltoid, 4/5 left hip flexor and knee extensor and ankle dorsiflexor Musc/Skel:  Other tight hamstrings bilaterally Gen. no distress, increased hip adductor tone   Assessment/Plan: 1. 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 Physiatrist is providing close team supervision and 24 hour management of active medical problems listed below. Physiatrist and rehab team continue to assess barriers to discharge/monitor patient progress toward functional and medical goals. FIM: FIM - Bathing Bathing Steps Patient Completed: Chest, Front perineal area, Buttocks, Right Arm, Left Arm, Abdomen Bathing: 4: Steadying assist  FIM - Upper Body Dressing/Undressing Upper body dressing/undressing steps patient completed: Thread/unthread right sleeve of pullover shirt/dresss, Thread/unthread left sleeve of pullover shirt/dress, Put head through opening of pull over shirt/dress, Pull shirt over trunk Upper body dressing/undressing: 5:  Set-up assist to: Obtain clothing/put away FIM - Lower Body Dressing/Undressing Lower body dressing/undressing steps patient completed: Thread/unthread right pants leg, Pull pants up/down Lower body dressing/undressing: 2: Max-Patient completed 25-49% of tasks  FIM - Toileting Toileting steps completed by patient: Adjust clothing prior to toileting (Stood to urinate) Research officer, trade union: Grab bar or rail for support Toileting: 2: Max-Patient completed 1 of 3 steps  FIM - Radio producer Devices: Product manager Transfers: 4-To toilet/BSC: Min A (steadying Pt. > 75%), 4-From toilet/BSC: Min A (steadying Pt. > 75%)  FIM - Bed/Chair Transfer Bed/Chair Transfer Assistive Devices: Arm rests Bed/Chair Transfer: 6: Supine > Sit: No assist, 6: Sit > Supine: No assist, 5: Bed > Chair or W/C: Supervision (verbal cues/safety issues), 4: Chair or W/C > Bed: Min A (steadying Pt. > 75%)  FIM - Locomotion: Wheelchair Distance: 130 Locomotion: Wheelchair: 2: Travels 50 - 149 ft with minimal assistance (Pt.>75%) FIM - Locomotion: Ambulation Locomotion: Ambulation Assistive Devices: Orthosis, Administrator Ambulation/Gait Assistance: 3: Mod assist Locomotion: Ambulation: 1: Travels less than 50 ft with moderate assistance (Pt: 50 - 74%)  Comprehension Comprehension Mode: Auditory Comprehension: 5-Understands basic 90% of the time/requires cueing < 10% of the time  Expression Expression Mode: Verbal Expression: 5-Expresses basic 90% of the time/requires cueing < 10% of the time.  Social Interaction Social Interaction: 5-Interacts appropriately 90% of the time - Needs monitoring or encouragement for participation or interaction.  Problem Solving Problem Solving: 3-Solves basic 50 - 74% of the time/requires cueing 25 - 49% of the time  Memory Memory: 3-Recognizes or recalls 50 - 74% of the time/requires cueing 25 - 49% of the time  Medical Problem List  and Plan: 1. 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 DVT Prophylaxis/Anticoagulation: SCDs. Monitor for any signs of DVT 3. Pain Management: Tylenol as needed 4. History of CVA 2 with hypercoagulable state. Coumadin on hold due to subarachnoid hemorrhage. 5. Neuropsych: This patient is capable of making decisions on his own behalf. 6. Skin/Wound Care: Routine skin checks 7. Fluids/Electrolytes/Nutrition: Strict I and O follow-up chemistries 8. Hypertension. Nimotop protocol to end 03/13/2014. Monitor with increased mobility 9. Dysphagia. Dysphagia 2 thin liquid diet. Monitor for any aspiration. Follow-up speech therapy 10. MRSA PCR screening positive. Contact precautions 11. Thrush: nystatin swish and swallow  LOS (Days) 7 A FACE TO FACE EVALUATION WAS PERFORMED  Khyli Swaim E 03/10/2014, 12:54 PM

## 2014-03-10 NOTE — Progress Notes (Signed)
Physical Therapy Session Note  Patient Details  Name: Brian [name] MRN: [id number] Date of Birth: 03/28/1945  Today's Date: 03/10/2014 PT Individual Time:  1118- 1210, 52 min     Short Term Goals: Week 1:  PT Short Term Goal 1 (Week 1): Pt will perform all basic transfers with S PT Short Term Goal 2 (Week 1): Pt will be able to reach 10" outside BOS with Min A for functional reaching PT Short Term Goal 3 (Week 1): Pt will performed WC mobility x100' with 1 cue for L attention PT Short Term Goal 4 (Week 1): Pt will ambulate 100' with RW and Min A PT Short Term Goal 5 (Week 1): Pt will ascend/descend 5 stairs with 2 rails and Min A     Skilled Therapeutic Interventions/Progress Updates:   Treatment session focused transfers and NMR techniques . Pt c/o of low back pain, unrated, chronic.     Supine > sit with S, using RUE to come to EOB. Transfer EOB > w/c with cues for positioning LLE, safety with set-up of brakes, sequencing. Pt propelled w/c 120' using RUE and RLE,  cues for L attention and LUE use.   neuromuscular re-education via forced use, VCS, TCs, for: -self -stretching in sitting, bil hamstrings x 30 seconds x 1, R and LLE - static standing with RW, working on weight shift and decreased hip IR LLE -gait focusing on upright posture, forward gaze and wider BOS  .  Pt requested to use bathroom, performed gait 10' on carpet and level tile, with RW to stand at toilet. Pt stood while PT + SPT pulled up and re-attached diaper. Pt performed gait on level tile x 20' with mod A and frequent cues for L attention to avoid scissoring gait pattern. Pt reported he was exhausted after gait.  Returned to room, and set up for lunch. Participation limited by low back pain and limited tolerance of standing.  Quick release belt applied and all needs left within reach.  Therapy Documentation Precautions:  Precautions Precautions: Fall Precaution Comments: L hemi Restrictions Weight Bearing  Restrictions: No     See FIM for current functional status  Therapy/Group: Individual Therapy  Kashay Cavenaugh 03/10/2014, 7:44 AM

## 2014-03-11 ENCOUNTER — Ambulatory Visit (HOSPITAL_COMMUNITY): Payer: Medicare Other | Admitting: Speech Pathology

## 2014-03-11 ENCOUNTER — Inpatient Hospital Stay (HOSPITAL_COMMUNITY): Payer: Medicare Other | Admitting: *Deleted

## 2014-03-11 ENCOUNTER — Encounter (HOSPITAL_COMMUNITY): Payer: Medicare Other

## 2014-03-11 DIAGNOSIS — G621 Alcoholic polyneuropathy: Secondary | ICD-10-CM

## 2014-03-11 MED ORDER — TRAZODONE HCL 50 MG PO TABS
50.0000 mg | ORAL_TABLET | Freq: Every evening | ORAL | Status: DC | PRN
  Administered 2014-03-14 – 2014-03-18 (×3): 50 mg via ORAL
  Filled 2014-03-11 (×3): qty 1

## 2014-03-11 NOTE — Progress Notes (Signed)
Speech Language Pathology Daily Session Note  Patient Details  Name: Brian [name] MRN: [id number] Date of Birth: 1945/04/02  Today's Date: 03/11/2014 SLP Individual Time: 0800-0900 SLP Individual Time Calculation (min): 60 min  Short Term Goals: Week 2: SLP Short Term Goal 1 (Week 2): Pt will tolerate dys 3 solids and thin liquids during a functional meal with supervision cues for use of swallowing precautions.  SLP Short Term Goal 2 (Week 2): Pt will improve basic functional problem solving over 75% of observable opportunities with mod assist multimodal cues.  SLP Short Term Goal 3 (Week 2): Pt will improve recall of basic, daily information for 75% accuracy with mod assist multimodal cues for use of external aids.  SLP Short Term Goal 4 (Week 2): Pt will sustain his attention during functional tasks for 15-20 minutes with min-mod cues for redirection.  SLP Short Term Goal 5 (Week 2): Pt will attend to left environment during functional tasks over 75% of observable opportunities with mod assist.    Skilled Therapeutic Interventions:  Pt was seen for skilled ST targeting goals for dysphagia and cognition.  Upon arrival, pt was partially reclined in bed, awake, lethargic, but agreeable to participate in ST with min encouragement.  SLP facilitated the session with trials of dys 3 and regular textures.  Pt continues to demonstrate adequate mastication of advanced textures with complete clearance of residual solids from the oral cavity post swallow.  Recommend a diet advancement to dys 3 solids with continued thin liquids at this time; full supervision for use of swallowing precautions due to cognition.   Upon completion of trials, SLP facilitated the session with a basic, familiar board game targeting functional problem solving and visual scanning to the left.  Pt benefited from min-mod verbal cues for error awareness and working memory, as well mod faded to min cues for use of a left marginal anchor  to facilitate improved visual scanning to the left.  Continue per current plan of care.   FIM:  Comprehension Comprehension Mode: Auditory Comprehension: 4-Understands basic 75 - 89% of the time/requires cueing 10 - 24% of the time Expression Expression Mode: Verbal Expression: 5-Expresses basic 90% of the time/requires cueing < 10% of the time. Social Interaction Social Interaction: 4-Interacts appropriately 75 - 89% of the time - Needs redirection for appropriate language or to initiate interaction. Problem Solving Problem Solving: 4-Solves basic 75 - 89% of the time/requires cueing 10 - 24% of the time Memory Memory: 3-Recognizes or recalls 50 - 74% of the time/requires cueing 25 - 49% of the time FIM - Eating Eating Activity: 5: Supervision/cues  Pain Pain Assessment Pain Assessment: No/denies pain  Therapy/Group: Individual Therapy  Sadaf Przybysz, Selinda Orion 03/11/2014, 4:08 PM

## 2014-03-11 NOTE — Progress Notes (Signed)
Physical Therapy Weekly Progress Note  Patient Details  Name: MACK ALVIDREZ MRN: [id number] Date of Birth: 1945-09-13  Beginning of progress report period: March 04, 2014 End of progress report period: March 11, 2014  Patient has met 4 of 5 short term goals.  Pt is making good progress in activity tolerance, L-sided attention, standing balance, righting reactions, and strength. Progress is somewhat limited by extensive medical history of CVA and back surgery, working within learned compensation strategies. Focus of PT tx is on maximizing safety, reducing falls risk, and restoring pt to highest level of independence.   Patient continues to demonstrate the following deficits: scissoring gait and LE spasticity, L-inattention, L-sided weakness, back pain, and decreased balance strategies and therefore will continue to benefit from skilled PT intervention to enhance overall performance with activity tolerance, balance, postural control, ability to compensate for deficits, functional use of  left upper extremity and left lower extremity and awareness.  Patient progressing toward long term goals..  Plan of care revisions: LTGs have been downgraded in regards to distance of gait and WC propulsion for optimal safety with gait and mobility. Pt will have 24/7 S/assist according to pt at DC. PT continues to assess need for new AFO and most safe assistive device for locomotion at DC.   PT Short Term Goals Week 1:  PT Short Term Goal 1 (Week 1): Pt will perform all basic transfers with S PT Short Term Goal 1 - Progress (Week 1): Progressing toward goal PT Short Term Goal 2 (Week 1): Pt will be able to reach 10" outside BOS with Min A for functional reaching PT Short Term Goal 2 - Progress (Week 1): Met PT Short Term Goal 3 (Week 1): Pt will performed WC mobility x100' with 1 cue for L attention PT Short Term Goal 3 - Progress (Week 1): Met PT Short Term Goal 4 (Week 1): Pt will ambulate 100' with RW and  Min A PT Short Term Goal 4 - Progress (Week 1): Met PT Short Term Goal 5 (Week 1): Pt will ascend/descend 5 stairs with 2 rails and Min A  PT Short Term Goal 5 - Progress (Week 1): Met Week 2:  PT Short Term Goal 1 (Week 2): STG=LTG due to LOS  Skilled Therapeutic Interventions/Progress Updates:  See skilled PT note for Blenda Mounts on this date for tx details.        Kennieth Rad, PT, DPT  03/11/2014, 12:44 PM

## 2014-03-11 NOTE — Progress Notes (Signed)
Physical Therapy Session Note  Patient Details  Name: Brian [name] MRN: [id number] Date of Birth: 1945-12-18  Today's Date: 03/11/2014 PT Individual Time: 0930-1030 PT Individual Time Calculation (min): 60 min   Short Term Goals: Week 1:  PT Short Term Goal 1 (Week 1): Pt will perform all basic transfers with S PT Short Term Goal 2 (Week 1): Pt will be able to reach 10" outside BOS with Min A for functional reaching PT Short Term Goal 3 (Week 1): Pt will performed WC mobility x100' with 1 cue for L attention PT Short Term Goal 4 (Week 1): Pt will ambulate 100' with RW and Min A PT Short Term Goal 5 (Week 1): Pt will ascend/descend 5 stairs with 2 rails and Min A   Skilled Therapeutic Interventions/Progress Updates:    Today's session focused on safety during transfers, emphasizing pt knowledge of sequencing. Pt was received EOB with NT. Pt requires coaxing to participating in therapy due to exhaustion, and c/o not being able to sleep at all here in the hospital. Pt also c/o not being able to eat bread, and that he doesn't get the calories he needs for energy. Communicated pt needs to RN. Pt was instructed to avoid long naps during the day in order to improve therapy tolerance and night sleep.  Pt performed gait >150' with RW in hallway with Min A, cues for increased step width to avoid scissoring. Marked Trendelenberg pattern and UE reliance on RW during gait. Pt ambulated to ADL apartment for transfer training: bed>chair, chair>sofa, R/L x 3 each side. Min A for sequencing, safety awareness, progressing to S for transfers. Pt was educated on sequencing for safe transfers via demonstration, teach-back, and written directions which he was given to keep.   Pt propelled w/c in ADL apartment over low-pile carpet and threshold to level kitchen tile. Pt performed sit>stand to static balance with BUEs on kitchen counter for support. Pt performed side-stepping bil directions, stopping at each cabinet  to reach for food items. Pt encouraged to reach at least 10' outside BOS for items on top shelf, using LUE. Neuromuscular re-education techniques used: forced use of LUE, cues for attention to L and food placement, weight shift.   Transfer training re-inforced for w/c>bed and sit>supine, which he performed with S. Bed mobility training focused on strategies to roll on to his stomach, which is how he prefers to sleep at home. Pt is rolling to R and can reposition RUE for comfort.  Pt was left prone in bed, needs within reach, to take a short nap before next therapy session. Pt was requested to call his son, his primary caregiver, and invite him to come for therapy/patient education this week and he verbalized understanding.  Therapy continues to be limited by pt's c/o exhaustion due to insomnia and LBP after upright tasks.  Therapy Documentation Precautions:  Precautions Precautions: Fall Precaution Comments: L hemi Restrictions Weight Bearing Restrictions: No General:   Vital Signs: Therapy Vitals BP: 102/82 mmHg Pain: none    See FIM for current functional status  Therapy/Group:  Blenda Mounts   I have read and agree with the following treatment session note.  Kennieth Rad, PT  03/11/2014, 12:27 PM

## 2014-03-11 NOTE — Progress Notes (Signed)
Patient ID: Brian [name], male   DOB: 1945/11/19, 69 y.o.   MRN: [id number]  Subjective/Complaints:  C/o poor sleep had problems at home and took doxepin with fair results, also complaints of numbness in feet that disturbs sleep  No new issues overnite, tolerating therapy program. Review of Systems - Negative except poor appetite  Objective: Vital Signs: Blood pressure 116/82, pulse 79, temperature 97.4 F (36.3 C), temperature source Other (Comment), resp. rate 18, height 5\' 7"  (1.702 m), weight 65.137 kg (143 lb 9.6 oz), SpO2 100 %. No results found. No results found for this or any previous visit (from the past 72 hour(s)).   HEENT: normal Cardio: RRR and no murmur Resp: CTA B/L and Unlabored GI: BS positive and nontender nondistended Extremity:  Pulses positive and No Edema Skin:   Intact Neuro: Confused, Abnormal Sensory decreased left L5 dermatome and Abnormal Motor 3 minus left biceps triceps grip deltoid, 4/5 left hip flexor and knee extensor and ankle dorsiflexor Musc/Skel:  Other tight hamstrings bilaterally Gen. no distress, increased hip adductor tone   Assessment/Plan: 1. 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 Physiatrist is providing close team supervision and 24 hour management of active medical problems listed below. Physiatrist and rehab team continue to assess barriers to discharge/monitor patient progress toward functional and medical goals. FIM: FIM - Bathing Bathing Steps Patient Completed: Chest, Right Arm, Left Arm, Abdomen, Front perineal area, Buttocks, Left lower leg (including foot), Right lower leg (including foot), Left upper leg, Right upper leg Bathing: 4: Steadying assist  FIM - Upper Body Dressing/Undressing Upper body dressing/undressing steps patient completed: Thread/unthread right sleeve of pullover shirt/dresss,  Thread/unthread left sleeve of pullover shirt/dress, Put head through opening of pull over shirt/dress, Pull shirt over trunk Upper body dressing/undressing: 5: Set-up assist to: Obtain clothing/put away FIM - Lower Body Dressing/Undressing Lower body dressing/undressing steps patient completed: Thread/unthread right pants leg, Thread/unthread left pants leg, Pull pants up/down, Fasten/unfasten pants Lower body dressing/undressing: 2: Max-Patient completed 25-49% of tasks  FIM - Toileting Toileting steps completed by patient: Adjust clothing prior to toileting (Stood to urinate) Research officer, trade union: Grab bar or rail for support Toileting: 2: Max-Patient completed 1 of 3 steps  FIM - Radio producer Devices: Product manager Transfers: 4-To toilet/BSC: Min A (steadying Pt. > 75%), 4-From toilet/BSC: Min A (steadying Pt. > 75%)  FIM - Bed/Chair Transfer Bed/Chair Transfer Assistive Devices: Arm rests Bed/Chair Transfer: 6: Supine > Sit: No assist, 6: Sit > Supine: No assist, 5: Bed > Chair or W/C: Supervision (verbal cues/safety issues), 4: Chair or W/C > Bed: Min A (steadying Pt. > 75%)  FIM - Locomotion: Wheelchair Distance: 130 Locomotion: Wheelchair: 2: Travels 50 - 149 ft with minimal assistance (Pt.>75%) FIM - Locomotion: Ambulation Locomotion: Ambulation Assistive Devices: Orthosis, Administrator Ambulation/Gait Assistance: 3: Mod assist Locomotion: Ambulation: 1: Travels less than 50 ft with moderate assistance (Pt: 50 - 74%)  Comprehension Comprehension Mode: Auditory Comprehension: 4-Understands basic 75 - 89% of the time/requires cueing 10 - 24% of the time  Expression Expression Mode: Verbal Expression: 4-Expresses basic 75 - 89% of the time/requires cueing 10 - 24% of the time. Needs helper to occlude trach/needs to repeat words.  Social Interaction Social Interaction: 4-Interacts appropriately 75 - 89% of the time - Needs  redirection for appropriate language or to initiate interaction.  Problem Solving Problem  Solving: 3-Solves basic 50 - 74% of the time/requires cueing 25 - 49% of the time  Memory Memory: 3-Recognizes or recalls 50 - 74% of the time/requires cueing 25 - 49% of the time  Medical Problem List and Plan: 1. 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 DVT Prophylaxis/Anticoagulation: SCDs. Monitor for any signs of DVT 3. Pain Management: Tylenol as needed 4. History of CVA 2 with hypercoagulable state. Coumadin on hold due to subarachnoid hemorrhage. 5. Neuropsych: This patient is capable of making decisions on his own behalf. 6. Skin/Wound Care: Routine skin checks 7. Fluids/Electrolytes/Nutrition: Strict I and O follow-up chemistries 8. Hypertension. Nimotop protocol to end 03/13/2014. Monitor with increased mobility 9. Dysphagia. Dysphagia 2 thin liquid diet. Monitor for any aspiration. Follow-up speech therapy 10. MRSA PCR screening positive. Contact precautions 11. Thrush: nystatin swish and swallow 12.  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

## 2014-03-11 NOTE — Progress Notes (Signed)
Occupational Therapy Session Note  Patient Details  Name: GERMAIN KOOPMANN MRN: [id number] Date of Birth: 1945/07/15  Today's Date: 03/11/2014 OT Individual Time: 1100-1200 OT Individual Time Calculation (min): 60 min    Short Term Goals: Week 1:  OT Short Term Goal 1 (Week 1): Pt will transfer to and from toilet to w/c with supervision. OT Short Term Goal 2 (Week 1): Pt will toilet with min A. OT Short Term Goal 3 (Week 1): Pt will be set up with UB dressing. OT Short Term Goal 4 (Week 1): Pt will stand with steadying A to don pants over hips. OT Short Term Goal 5 (Week 1): Pt will be able to don pants over feet and socks with extra time.   Skilled Therapeutic Interventions/Progress Updates:    Pt asleep in bed upon arrival.  Pt required max encouragement to participate.  Pt stated he was exhausted because he had not been getting any sleep during the night.  Pt also c/o that his energy level was low because he couldn't eat any bread.  Pt declined bathing but did agree to engage in dressing and grooming tasks.  Pt requires steady A for standing balance.  Pt requires steady A for stand step transfer.  Pt requires min verbal cues for safety awareness with positioning of LLE during transitional movements.  Pt required extra time to complete tasks with multiple rest breaks. Focus on activity tolerance, sit<>stand, functional transfers, and safety awareness.  Therapy Documentation Precautions:  Precautions Precautions: Fall Precaution Comments: L hemi Restrictions Weight Bearing Restrictions: No General:   Vital Signs: Therapy Vitals BP: 102/82 mmHg Pain:  Pt denied pain  See FIM for current functional status  Therapy/Group: Individual Therapy  Leroy Libman 03/11/2014, 1:49 PM

## 2014-03-12 ENCOUNTER — Inpatient Hospital Stay (HOSPITAL_COMMUNITY): Payer: Medicare Other

## 2014-03-12 ENCOUNTER — Inpatient Hospital Stay (HOSPITAL_COMMUNITY): Payer: Medicare Other | Admitting: Occupational Therapy

## 2014-03-12 ENCOUNTER — Inpatient Hospital Stay (HOSPITAL_COMMUNITY): Payer: Medicare Other | Admitting: Speech Pathology

## 2014-03-12 DIAGNOSIS — M961 Postlaminectomy syndrome, not elsewhere classified: Secondary | ICD-10-CM

## 2014-03-12 NOTE — Progress Notes (Signed)
Speech Language Pathology Daily Session Note  Patient Details  Name: Brian [name] MRN: [id number] Date of Birth: [birth year]  Today's Date: 03/12/2014 SLP Individual Time: 9758-8325 SLP Individual Time Calculation (min): 35 min  Short Term Goals: Week 2: SLP Short Term Goal 1 (Week 2): Pt will tolerate dys 3 solids and thin liquids during a functional meal with supervision cues for use of swallowing precautions.  SLP Short Term Goal 2 (Week 2): Pt will improve basic functional problem solving over 75% of observable opportunities with mod assist multimodal cues.  SLP Short Term Goal 3 (Week 2): Pt will improve recall of basic, daily information for 75% accuracy with mod assist multimodal cues for use of external aids.  SLP Short Term Goal 4 (Week 2): Pt will sustain his attention during functional tasks for 15-20 minutes with min-mod cues for redirection.  SLP Short Term Goal 5 (Week 2): Pt will attend to left environment during functional tasks over 75% of observable opportunities with mod assist.    Skilled Therapeutic Interventions:  Pt was seen for skilled ST targeting cognitive goals.  Upon arrival, pt was upright in wheelchair, awake, alert, and agreeable to participate in [name].  SLP facilitated the session with a structured new learning activity targeting basic functional problem solving and visual scanning to the left.  Pt required mod cues during the abovementioned task for error awareness, working memory, and left inattention.  Continue per current plan of care.    FIM:  Comprehension Comprehension Mode: Auditory Comprehension: 4-Understands basic 75 - 89% of the time/requires cueing 10 - 24% of the time Expression Expression Mode: Verbal Expression: 5-Expresses basic 90% of the time/requires cueing < 10% of the time. Social Interaction Social Interaction: 4-Interacts appropriately 75 - 89% of the time - Needs redirection for appropriate language or to initiate  interaction. Problem Solving Problem Solving: 4-Solves basic 75 - 89% of the time/requires cueing 10 - 24% of the time Memory Memory: 3-Recognizes or recalls 50 - 74% of the time/requires cueing 25 - 49% of the time  Pain Pain Assessment Pain Assessment: No/denies pain  Therapy/Group: Individual Therapy  Brian [name], Brian [name] 03/12/2014, 9:32 PM

## 2014-03-12 NOTE — Progress Notes (Signed)
Orthopedic Tech Progress Note Patient Details:  Brian [name] Dec 17, 1945 [id number]  Patient ID: Brian [name], male   DOB: 09/17/1945, 69 y.o.   MRN: [id number] Called in advanced brace order; spoke with Dyann Kief, Rehmat Murtagh 03/12/2014, 12:23 PM

## 2014-03-12 NOTE — Progress Notes (Signed)
Social Work Elease Hashimoto, LCSW Social Worker Signed  Patient Care Conference 03/12/2014  1:44 PM    Expand All Collapse All   Inpatient RehabilitationTeam Conference and Plan of Care Update Date: 03/12/2014   Time: 10;30 AM     Patient Name: ABNER ARDIS       Medical Record Number: [id number]  Date of Birth: 1945/10/02 Sex: Male         Room/Bed: 4W11C/4W11C-01 Payor Info: Payor: MEDICARE / Plan: MEDICARE PART A AND B / Product Type: *No Product type* /    Admitting Diagnosis: SAH  Admit Date/Time:  03/03/2014  3:04 PM Admission Comments: No comment available   Primary Diagnosis:  Subarachnoid hemorrhage Principal Problem: Subarachnoid hemorrhage    Patient Active Problem List     Diagnosis  Date Noted   .  Postlaminectomy syndrome, lumbar region  03/12/2014   .  Acute left hemiparesis  03/04/2014   .  Subarachnoid hemorrhage  03/03/2014   .  Subarachnoid hemorrhage due to ruptured aneurysm  02/20/2014   .  Subarachnoid hematoma  02/20/2014   .  Abdominal  pain, other specified site  09/29/2011   .  C. difficile colitis  05/24/2011   .  Hypercoagulable state  05/24/2011   .  Anemia  05/20/2011   .  Long term current use of anticoagulant  05/20/2011   .  Abnormal gallbladder ultrasound  05/20/2011   .  Sacral decubitus ulcer, stage II  05/20/2011   .  Malnutrition, calorie  05/20/2011   .  H/O: CVA (cardiovascular accident)  04/20/2011   .  Vasculitis  12/24/2010   .  Renal failure, acute  12/24/2010   .  Colitis  12/16/2010   .  Essential hypertension  12/16/2010   .  GERD (gastroesophageal reflux disease)  12/16/2010   .  Nausea and vomiting  12/13/2010   .  Abdominal pain, acute, epigastric  12/13/2010   .  Leukocytosis  12/13/2010     Expected Discharge Date: Expected Discharge Date: 03/19/14  Team Members Present: Physician leading conference: Dr. Alysia Penna Social Worker Present: Ovidio Kin, LCSW Nurse Present: Heather Roberts, RN PT Present: Georjean Mode, PT;Blair Hobble, PT OT Present: Simonne Come, Dorothyann Gibbs, OT SLP Present: Windell Moulding, SLP PPS Coordinator present : Daiva Nakayama, RN, CRRN        Current Status/Progress  Goal  Weekly Team Focus   Medical     chronic Left foot drop  ? home d/c  Order AFO, work on continence   Bowel/Bladder     Continent of bowel and bladder. LBM 03/09/14   Continent of bowel and bladder  Monitor   Swallow/Nutrition/ Hydration     Dys 3 solids, thin liquids; full supervision for cognition    supervision wtih least restrictive diet   diet toleration    ADL's     bathing-steady A, UB dressing-supervision; LB dressing-max A; functional transfers-min A  supervision with self care, min A with simple meal prep  functional transfers, dynamic sitting and standing balance, cognitive remediation   Mobility     S/min A transfer, Min/Mod A gait with RW up to 150', min A 5 stairs, S WC prop  mod I basic tansfers, S gait x50', S stairs, S WC prop  L-sided attention and NMR, transfer training, family training, household safety and fall risk reduction   Communication               Safety/Cognition/ Behavioral Observations  Mod-max assist   min assist for basic tasks   emergent/intellectual awareness of deficits, basic problem solving, memory of daily information    Pain     No c/o pain  <3  Monitor for nonverbal cues of pain    Skin     CDI  No skin breakdown  Encourage pt to turn q 2hrs      *See Care Plan and progress notes for long and short-term goals.    Barriers to Discharge:  has chronic def, poor social situation      Possible Resolutions to Barriers:   cont rehab     Discharge Planning/Teaching Needs:   Home with son who can provide supervision will need for him to come in for educaiton.  Pt not sleeping here due to back pain and uncomfortable bed       Team Discussion:    Need son to come in for family education and to bring in pt's back brace. Back pain limiting him in therapies.  Sleep issues.  Order AFO for foot drop. Downgraded goals to supervision gait.   Revisions to Treatment Plan:    Downgraded goals supervision gait    Continued Need for Acute Rehabilitation Level of Care: The patient requires daily medical management by a physician with specialized training in physical medicine and rehabilitation for the following conditions: Daily direction of a multidisciplinary physical rehabilitation program to ensure safe treatment while eliciting the highest outcome that is of practical value to the patient.: Yes Daily medical management of patient stability for increased activity during participation in an intensive rehabilitation regime.: Yes Daily analysis of laboratory values and/or radiology reports with any subsequent need for medication adjustment of medical intervention for : Neurological problems;Other  Manning Luna, Gardiner Rhyme 03/12/2014, 1:44 PM                 Elease Hashimoto, LCSW Social Worker Signed  Patient Care Conference 03/05/2014  1:52 PM    Expand All Collapse All   Inpatient RehabilitationTeam Conference and Plan of Care Update Date: 03/05/2014   Time: 10;55 AM     Patient Name: GARDINER ESPANA       Medical Record Number: [id number]  Date of Birth: 1945-06-24 Sex: Male         Room/Bed: 4W11C/4W11C-01 Payor Info: Payor: MEDICARE / Plan: MEDICARE PART A AND B / Product Type: *No Product type* /    Admitting Diagnosis: SAH  Admit Date/Time:  03/03/2014  3:04 PM Admission Comments: No comment available   Primary Diagnosis:  Subarachnoid hemorrhage Principal Problem: Subarachnoid hemorrhage    Patient Active Problem List     Diagnosis  Date Noted   .  Acute left hemiparesis  03/04/2014   .  Subarachnoid hemorrhage  03/03/2014   .  Subarachnoid hemorrhage due to ruptured aneurysm  02/20/2014   .  Subarachnoid hematoma  02/20/2014   .  Abdominal  pain, other specified site  09/29/2011   .  C. difficile colitis  05/24/2011   .  Hypercoagulable  state  05/24/2011   .  Anemia  05/20/2011   .  Long term current use of anticoagulant  05/20/2011   .  Abnormal gallbladder ultrasound  05/20/2011   .  Sacral decubitus ulcer, stage II  05/20/2011   .  Malnutrition, calorie  05/20/2011   .  H/O: CVA (cardiovascular accident)  04/20/2011   .  Vasculitis  12/24/2010   .  Renal failure, acute  12/24/2010   .  Colitis  12/16/2010   .  Essential hypertension  12/16/2010   .  GERD (gastroesophageal reflux disease)  12/16/2010   .  Nausea and vomiting  12/13/2010   .  Abdominal pain, acute, epigastric  12/13/2010   .  Leukocytosis  12/13/2010     Expected Discharge Date: Expected Discharge Date: 03/19/14  Team Members Present: Physician leading conference: Dr. 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 Per report incontinent of bladder at [facility]  monitor for incontinence and timed toileting   monitor patient for incontinence at [facility]    Swallow/Nutrition/ Hydration     Dys 2 solids, thin liquids; full supervision due to cognitive deficits   supervision with least restrictive diet   trials of advanced consistencies, diet toleration    ADL's     min A bathing, UB dressing, toilet transfers; max A LB dressing, toileting  supervision with self care, min A with simple meal prep  dynamic sitting and standing balance, LUE neuro re-ed, cognitive activities, functional mobility   Mobility     min transfer, mod/max gait x 32' 03/05/14; stairs wtih mod assist x 5 02/22/14, w/c prop x 160' with min assist   Mod I transfers, S gait x150', S stairs with 1 rail, Mod I WC   L NMR, L-sided attention tasks, balance training, gait and stairs.    Communication               Safety/Cognition/ Behavioral Observations    Max assist   min assist for basic tasks   sustained attention, basic problem solving, memory of daily information    Pain     <4  assess pain level after therapy session, and pre-medicate PRN  monitor effectiveness of medication    Skin     Monitor skin q shift for breakdown   No skin breakdown  Assess skin q shift      *See Care Plan and progress notes for long and short-term goals.    Barriers to Discharge:  ?caregiver     Possible Resolutions to Barriers:   ID caregiver, cont rehab     Discharge Planning/Teaching Needs:   Home with son anf PCS worker, will see if can increase P[employer] worker's hours from 2 hrs to 4 hrs.       Team Discussion:    Goals-supervision/min for cooking.  Pt loves to cook.  Spascity in legs scissoring gait from previous strokes. Back pain limits his participation.  Field cut also. Will need 24 hr supervision for safety   Revisions to Treatment Plan:    None    Continued Need for Acute Rehabilitation Level of Care: The patient requires daily medical management by a physician with specialized training in physical medicine and rehabilitation for the following conditions: Daily direction of a multidisciplinary physical rehabilitation program to ensure safe treatment while eliciting the highest outcome that is of practical value to the patient.: Yes Daily medical management of patient stability for increased activity during participation  in an intensive rehabilitation regime.: Yes Daily analysis of laboratory values and/or radiology reports with any subsequent need for medication adjustment of medical intervention for : Neurological problems;Other  Elease Hashimoto 03/06/2014, 9:17 AM                  Patient ID: Naaman Plummer, male   DOB: April 23, 1945,  69 y.o.   MRN: [id number]

## 2014-03-12 NOTE — Progress Notes (Addendum)
Physical Therapy Session Note  Patient Details  Name: Brian [name] MRN: [id number] Date of Birth: 12-07-1945  Today's Date: 03/12/2014 PT Individual Time: 1400-1500 PT Individual Time Calculation (min): 60 min   Short Term Goals: Week 2:  PT Short Term Goal 1 (Week 2): STG=LTG due to LOS  Skilled Therapeutic Interventions/Progress Updates:  Bed mobility with supervision.  Bed> w/c to R superviison.  Pt reported he needed to urinate.  Toilet (standing) to urinate with supervision. Hand washing in sitting with cue for thoroughly rinsing L hand.  W/c> NuStep to R with supervision.  neuromuscular re-education via forced use, visual feedback, tactile cues for: -prolonged passive stretch L hip adductors -NuStep using bil LEs only focusing on decreasing L hip add/IR x 2 minutes x 2 -bil hip abd with ER in static sitting -back extension in sitting using chest fly/zip line dynamic apparatus with another person -gait with RW, 5# on either side bar, x 100' with close supervision  Pt continues with poor L hip stance stability during gait, with resulting Trendelenburg deviation, but weighted RW appeared to help pt stabilize and not experience true LOB to the L.   Gait up/down 5 steps 2 rails with close supervision, step-to method without cues for leading with correct foot.  SLP picked up pt for her session.    Therapy Documentation Precautions:  Precautions Precautions: Fall Precaution Comments: L hemi Restrictions Weight Bearing Restrictions: No   Pain: Pain Assessment Pain Assessment: No/denies pain   Locomotion : Ambulation Ambulation/Gait Assistance: 5: Supervision Wheelchair Mobility Distance: 30     See FIM for current functional status  Therapy/Group: Individual Therapy  Jden Want 03/12/2014, 3:27 PM

## 2014-03-12 NOTE — Patient Care Conference (Signed)
Inpatient RehabilitationTeam Conference and Plan of Care Update Date: 03/12/2014   Time: 10;30 AM    Patient Name: Brian [name]      Medical Record Number: [id number]  Date of Birth: Sep 09, 1945 Sex: Male         Room/Bed: 4W11C/4W11C-01 Payor Info: Payor: MEDICARE / Plan: MEDICARE PART A AND B / Product Type: *No Product type* /    Admitting Diagnosis: SAH  Admit Date/Time:  03/03/2014  3:04 PM Admission Comments: No comment available   Primary Diagnosis:  Subarachnoid hemorrhage Principal Problem: Subarachnoid hemorrhage  Patient Active Problem List   Diagnosis Date Noted  . Postlaminectomy syndrome, lumbar region 03/12/2014  . Acute left hemiparesis 03/04/2014  . Subarachnoid hemorrhage 03/03/2014  . Subarachnoid hemorrhage due to ruptured aneurysm 02/20/2014  . Subarachnoid hematoma 02/20/2014  . Abdominal  pain, other specified site 09/29/2011  . C. difficile colitis 05/24/2011  . Hypercoagulable state 05/24/2011  . Anemia 05/20/2011  . Long term current use of anticoagulant 05/20/2011  . Abnormal gallbladder ultrasound 05/20/2011  . Sacral decubitus ulcer, stage II 05/20/2011  . Malnutrition, calorie 05/20/2011  . H/O: CVA (cardiovascular accident) 04/20/2011  . Vasculitis 12/24/2010  . Renal failure, acute 12/24/2010  . Colitis 12/16/2010  . Essential hypertension 12/16/2010  . GERD (gastroesophageal reflux disease) 12/16/2010  . Nausea and vomiting 12/13/2010  . Abdominal pain, acute, epigastric 12/13/2010  . Leukocytosis 12/13/2010    Expected Discharge Date: Expected Discharge Date: 03/19/14  Team Members Present: Physician leading conference: Dr. Alysia Penna Social Worker Present: Ovidio Kin, LCSW Nurse Present: Heather Roberts, RN PT Present: Georjean Mode, PT;Blair Hobble, PT OT Present: Simonne Come, Dorothyann Gibbs, OT SLP Present: Windell Moulding, SLP PPS Coordinator present : Daiva Nakayama, RN, CRRN     Current Status/Progress Goal Weekly Team Focus   Medical   chronic Left foot drop  ? home d/c  Order AFO, work on continence   Bowel/Bladder   Continent of bowel and bladder. LBM 03/09/14  Continent of bowel and bladder  Monitor   Swallow/Nutrition/ Hydration   Dys 3 solids, thin liquids; full supervision for cognition   supervision wtih least restrictive diet   diet toleration    ADL's   bathing-steady A, UB dressing-supervision; LB dressing-max A; functional transfers-min A  supervision with self care, min A with simple meal prep  functional transfers, dynamic sitting and standing balance, cognitive remediation   Mobility   S/min A transfer, Min/Mod A gait with RW up to 150', min A 5 stairs, S WC prop  mod I basic tansfers, S gait x50', S stairs, S WC prop  L-sided attention and NMR, transfer training, family training, household safety and fall risk reduction   Communication             Safety/Cognition/ Behavioral Observations  Mod-max assist   min assist for basic tasks   emergent/intellectual awareness of deficits, basic problem solving, memory of daily information    Pain   No c/o pain  <3  Monitor for nonverbal cues of pain   Skin   CDI  No skin breakdown  Encourage pt to turn q 2hrs      *See Care Plan and progress notes for long and short-term goals.  Barriers to Discharge: has chronic def, poor social situation    Possible Resolutions to Barriers:  cont rehab    Discharge Planning/Teaching Needs:  Home with son who can provide supervision will need for him to come in for educaiton.  Pt not  sleeping here due to back pain and uncomfortable bed      Team Discussion:  Need son to come in for family education and to bring in pt's back brace. Back pain limiting him in therapies. Sleep issues.  Order AFO for foot drop. Downgraded goals to supervision gait.  Revisions to Treatment Plan:  Downgraded goals supervision gait   Continued Need for Acute Rehabilitation Level of Care: The patient requires daily medical  management by a physician with specialized training in physical medicine and rehabilitation for the following conditions: Daily direction of a multidisciplinary physical rehabilitation program to ensure safe treatment while eliciting the highest outcome that is of practical value to the patient.: Yes Daily medical management of patient stability for increased activity during participation in an intensive rehabilitation regime.: Yes Daily analysis of laboratory values and/or radiology reports with any subsequent need for medication adjustment of medical intervention for : Neurological problems;Other  Vicki Chaffin, Gardiner Rhyme 03/12/2014, 1:44 PM

## 2014-03-12 NOTE — Progress Notes (Signed)
Social Work Patient ID: Brian [name], male   DOB: 1946/01/08, 69 y.o.   MRN: [id number] Spoke with daughter via telephone to discuss scheduling family education she can only bring in her brother 50 since she works during the week and has to [employer] him from [place].  Scheduled family education for 1;00 on Sat.  She will make sure she brings in [pt's back brace then also.  Aware pt will require 24 hr supervision at discharge.

## 2014-03-12 NOTE — Progress Notes (Signed)
Occupational Therapy Session Note  Patient Details  Name: Brian [name] MRN: [id number] Date of Birth: [birth year]  Today's Date: 03/12/2014 OT Individual Time: 6269-4854 OT Individual Time Calculation (min): 45 min    Short Term Goals: Week 1:  OT Short Term Goal 1 (Week 1): Pt will transfer to and from toilet to w/c with supervision. OT Short Term Goal 2 (Week 1): Pt will toilet with min A. OT Short Term Goal 3 (Week 1): Pt will be set up with UB dressing. OT Short Term Goal 4 (Week 1): Pt will stand with steadying A to don pants over hips. OT Short Term Goal 5 (Week 1): Pt will be able to don pants over feet and socks with extra time.       Skilled Therapeutic Interventions/Progress Updates:    Pt seen for BADL retraining of shower, dressing and grooming with a focus on sit to stand, standing balance and use of his LUE. Pt demonstrated improved initiation today with getting the tasks completed in a timely manner and using his LUE actively. He continues to need cues for safe foot positioning prior to standing but he is able to stand with just steadying A and maintained his balance with steadying A when in shower and donning pants over hips. Due to back pain continues to need A with socks and shoes.  Pt was able to transfer w/c >< bed with close S. He decided to stay in w/c for a while to complete grooming at the sink. Pt resting in chair with quick release belt on.  Therapy Documentation Precautions:  Precautions Precautions: Fall Precaution Comments: L hemi Restrictions Weight Bearing Restrictions: No    Vital Signs: Therapy Vitals Temp: 97.6 F (36.4 C) Temp Source: Oral Pulse Rate: 67 Resp: 18 BP: 118/74 mmHg Patient Position (if appropriate): Lying Oxygen Therapy SpO2: 100 % O2 Device: Not Delivered Pain: Pain Assessment Pain Assessment: No/denies pain ADL: ADL ADL Comments: Refer to FIM  See FIM for current functional status  Therapy/Group: Individual  Therapy  [place] 03/12/2014, 8:02 AM

## 2014-03-12 NOTE — Progress Notes (Signed)
Patient ID: Brian [name], male   DOB: [birth year], 70 y.o.   MRN: [id number]  Subjective/Complaints:  Somewhat better sleep last night Patient states that he's had 2 back surgeries which has resulted in left foot and ankle weakness which is chronic No new issues overnite, tolerating therapy program. Review of Systems - Negative except poor appetite  Objective: Vital Signs: Blood pressure 118/74, pulse 67, temperature 97.6 F (36.4 C), temperature source Oral, resp. rate 18, height 5' 7" (1.702 m), weight 65.137 kg (143 lb 9.6 oz), SpO2 100 %. No results found. No results found for this or any previous visit (from the past 72 hour(s)).   HEENT: normal Cardio: RRR and no murmur Resp: CTA B/L and Unlabored GI: BS positive and nontender nondistended Extremity:  Pulses positive and No Edema Skin:   Intact Neuro: Confused, Abnormal Sensory decreased left L5 dermatome and Abnormal Motor 3 minus left biceps triceps grip deltoid, 4/5 left hip flexor and knee extensor and ankle dorsiflexor Musc/Skel:  Other tight hamstrings bilaterally Gen. no distress, increased hip adductor tone   Assessment/Plan: 1. 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 Physiatrist is providing close team supervision and 24 hour management of active medical problems listed below. Physiatrist and rehab team continue to assess barriers to discharge/monitor patient progress toward functional and medical goals.  Team conference today please see physician documentation under team conference tab, met with team face-to-face to discuss problems,progress, and goals. Formulized individual treatment plan based on medical history, underlying problem and comorbidities.FIM: FIM - Bathing Bathing Steps Patient Completed: Chest, Right Arm, Left Arm, Abdomen, Front perineal area, Buttocks, Left lower leg  (including foot), Right lower leg (including foot), Left upper leg, Right upper leg Bathing: 0: Activity did not occur  FIM - Upper Body Dressing/Undressing Upper body dressing/undressing steps patient completed: Thread/unthread right sleeve of pullover shirt/dresss, Thread/unthread left sleeve of pullover shirt/dress, Put head through opening of pull over shirt/dress, Pull shirt over trunk Upper body dressing/undressing: 5: Set-up assist to: Obtain clothing/put away FIM - Lower Body Dressing/Undressing Lower body dressing/undressing steps patient completed: Thread/unthread right pants leg, Thread/unthread left pants leg, Pull pants up/down, Fasten/unfasten pants, Don/Doff right sock, Don/Doff left sock Lower body dressing/undressing: 3: Mod-Patient completed 50-74% of tasks  FIM - Toileting Toileting steps completed by patient: Adjust clothing prior to toileting (Stood to urinate) Research officer, trade union: Grab bar or rail for support Toileting: 2: Max-Patient completed 1 of 3 steps  FIM - Radio producer Devices: Product manager Transfers: 4-To toilet/BSC: Min A (steadying Pt. > 75%), 4-From toilet/BSC: Min A (steadying Pt. > 75%)  FIM - Bed/Chair Transfer Bed/Chair Transfer Assistive Devices: Arm rests Bed/Chair Transfer: 5: Supine > Sit: Supervision (verbal cues/safety issues), 5: Sit > Supine: Supervision (verbal cues/safety issues), 5: Bed > Chair or W/C: Supervision (verbal cues/safety issues), 4: Chair or W/C > Bed: Min A (steadying Pt. > 75%)  FIM - Locomotion: Wheelchair Distance: 130 Locomotion: Wheelchair: 1: Travels less than 50 ft with supervision, cueing or coaxing FIM - Locomotion: Ambulation Locomotion: Ambulation Assistive Devices: Administrator Ambulation/Gait Assistance: 4: Min assist (>150') Locomotion: Ambulation: 4: Travels 150 ft or more with minimal assistance (Pt.>75%)  Comprehension Comprehension Mode:  Auditory Comprehension: 4-Understands basic 75 - 89% of the time/requires cueing 10 - 24% of the time  Expression Expression Mode: Verbal Expression: 5-Expresses basic 90% of the time/requires cueing <  10% of the time.  Social Interaction Social Interaction: 4-Interacts appropriately 75 - 89% of the time - Needs redirection for appropriate language or to initiate interaction.  Problem Solving Problem Solving: 4-Solves basic 75 - 89% of the time/requires cueing 10 - 24% of the time  Memory Memory: 3-Recognizes or recalls 50 - 74% of the time/requires cueing 25 - 49% of the time  Medical Problem List and Plan: 1. 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 DVT Prophylaxis/Anticoagulation: SCDs. Monitor for any signs of DVT 3. Pain Management: Tylenol as needed 4. History of CVA 2 with hypercoagulable state. Coumadin on hold due to subarachnoid hemorrhage. 5. Neuropsych: This patient is capable of making decisions on his own behalf. 6. Skin/Wound Care: Routine skin checks 7. Fluids/Electrolytes/Nutrition: Strict I and O follow-up chemistries 8. Hypertension. Nimotop protocol to end 03/13/2014. Monitor with increased mobility 9. Dysphagia. Dysphagia 2 thin liquid diet. Monitor for any aspiration. Follow-up speech therapy 10. MRSA PCR screening positive. Contact precautions 11. Thrush: nystatin swish and swallow 12.  Neuropathy +/1 chronic LLE radic LOS (Days) 9 A FACE TO FACE EVALUATION WAS PERFORMED  Samaia Iwata E 03/12/2014, 9:33 AM

## 2014-03-12 NOTE — Progress Notes (Signed)
Physical Therapy Session Note  Patient Details  Name: Brian [name] MRN: [id number] Date of Birth: 1945-06-14  Today's Date: 03/12/2014 PT Individual Time: 1120-1210 PT Individual Time Calculation (min): 50 min   Short Term Goals: Week 2:  PT Short Term Goal 1 (Week 2): STG=LTG due to LOS  Skilled Therapeutic Interventions/Progress Updates:    Tx today focused on safety with transfers, gait, patient education and determining pt's existing equipment at home  Pt was received supine in bed; Supine > sit S with min cues for hand placement. Pt requested to use toilet, ambulated to bathroom to stand at toilet with RW under S. Orthosis donned by SPT with patient seated EOB. Transfer EOB>w/c S with cues for foot placement to increase BOS.   Activities in ADL apartment: practice sequence of safe transfers to Left x1 from W/c>sofa. Pt performed with S after reviewing his written instructions. Pt performed transfer from sofa>floor, floor >sofa, both with S. He is able to accomplish floor transfers with creative compensations. Pt educated on importance of floor transfer training to prevent injury at home during a fall. Pt ambulated 12' with RW and Min A over low-pile carpet, over threshold, and low tile, with cues for foot placement, upper body posture.   Neuro-muscular reeducation via: forced use of LUE, VCs for L attention during gait and transfers, VCs for L foot placement, upright posture, and forward gaze. Pt continues to demonstrate significant gait deficits: strong scissoring, marked Trendelenberg bil, significant L hip instability, extensor weakness, cervicothoracic flexion  Therapeutic activities: transfers over multiple surfaces at different heights, gait with RW in ADL apartment over different surfaces. Activity tolerance continues to be limited by c/o LBP, worsening with fatigue.  Pt reported today that he has a back brace at home which he got from a doctor [facility]. Also  discussed that we need to see his scooter, which is a Hover-round. PT has requested that CSW contact pt's son and daughter to bring in scooter, back brace, and receive family education.  Pt was disappointed that he is not going home tomorrow. PT explained the importance of ongoing therapy to continue to improve functional mobility and reduce risk of falls at home.  Pt left seated in w/c with tray table for lunch with quick release belt, all needs within reach.   Therapy Documentation Precautions:  Precautions Precautions: Fall Precaution Comments: L hemi Restrictions Weight Bearing Restrictions: No   Locomotion : Ambulation Ambulation/Gait Assistance: 4: Min assist (40' in ADL apartment with RW and Min A ) Wheelchair Mobility Distance:  (15)   See FIM for current functional status  Therapy/Group: Individual Therapy  Blenda Mounts 03/12/2014, 12:38 PM

## 2014-03-13 ENCOUNTER — Inpatient Hospital Stay (HOSPITAL_COMMUNITY): Payer: Medicare Other | Admitting: Occupational Therapy

## 2014-03-13 ENCOUNTER — Inpatient Hospital Stay (HOSPITAL_COMMUNITY): Payer: Medicare Other

## 2014-03-13 ENCOUNTER — Inpatient Hospital Stay (HOSPITAL_COMMUNITY): Payer: Medicare Other | Admitting: Speech Pathology

## 2014-03-13 NOTE — Progress Notes (Addendum)
Speech Language Pathology Daily Session Note  Patient Details  Name: Brian [name] MRN: [id number] Date of Birth: 10/16/1945  Today's Date: 03/13/2014 SLP Group Time: 1130-1230 SLP Group Time Calculation (min): 60 min  Short Term Goals: Week 2: SLP Short Term Goal 1 (Week 2): Pt will tolerate dys 3 solids and thin liquids during a functional meal with supervision cues for use of swallowing precautions.  SLP Short Term Goal 2 (Week 2): Pt will improve basic functional problem solving over 75% of observable opportunities with mod assist multimodal cues.  SLP Short Term Goal 3 (Week 2): Pt will improve recall of basic, daily information for 75% accuracy with mod assist multimodal cues for use of external aids.  SLP Short Term Goal 4 (Week 2): Pt will sustain his attention during functional tasks for 15-20 minutes with min-mod cues for redirection.  SLP Short Term Goal 5 (Week 2): Pt will attend to left environment during functional tasks over 75% of observable opportunities with mod assist.    Skilled Therapeutic Interventions: Skilled group treatment session focused on addressing dysphagia and cognition goals.  SLP facilitated session with Min question cues to problem solve tray set-up and Max verbal cues to utilize small portions and a slow pace of self-feeding.  Patient appeared distracted by conversation which could contribute the need for increased amount of cuing.  Recommend to continue with current plan of care.    FIM:  Comprehension Comprehension Mode: Auditory Comprehension: 4-Understands basic 75 - 89% of the time/requires cueing 10 - 24% of the time Expression Expression Mode: Verbal Expression: 5-Expresses basic 90% of the time/requires cueing < 10% of the time. Social Interaction Social Interaction: 4-Interacts appropriately 75 - 89% of the time - Needs redirection for appropriate language or to initiate interaction. Problem Solving Problem Solving: 4-Solves basic 75 - 89% of  the time/requires cueing 10 - 24% of the time Memory Memory: 3-Recognizes or recalls 50 - 74% of the time/requires cueing 25 - 49% of the time FIM - Eating Eating Activity: 4: Helper checks for pocketed food;5: Needs verbal cues/supervision;5: Set-up assist for open containers;5: Set-up assist for cut food  Pain Pain Assessment Pain Assessment: No/denies pain  Therapy/Group: Group Therapy  Carmelia Roller., CCC-SLP [phone]  Nashonda Limberg 03/13/2014, 12:38 PM

## 2014-03-13 NOTE — Progress Notes (Signed)
Patient ID: Brian [name], male   DOB: Feb 09, 1946, 69 y.o.   MRN: [id number]  Subjective/Complaints:  Slept ok , no new issues Review of Systems - Negative except poor appetite  Objective: Vital Signs: Blood pressure 114/71, pulse 64, temperature 98.5 F (36.9 C), temperature source Oral, resp. rate 20, height 5\' 7"  (1.702 m), weight 65.137 kg (143 lb 9.6 oz), SpO2 100 %. No results found. No results found for this or any previous visit (from the past 72 hour(s)).   HEENT: normal Cardio: RRR and no murmur Resp: CTA B/L and Unlabored GI: BS positive and nontender nondistended Extremity:  Pulses positive and No Edema Skin:   Intact Neuro: Confused, Abnormal Sensory decreased left L5 dermatome and Abnormal Motor 3 minus left biceps triceps grip deltoid, 4/5 left hip flexor and knee extensor and 2- ankle dorsiflexor Musc/Skel:  Other tight hamstrings bilaterally Gen. no distress, increased hip adductor tone   Assessment/Plan: 1. 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 Physiatrist is providing close team supervision and 24 hour management of active medical problems listed below. Physiatrist and rehab team continue to assess barriers to discharge/monitor patient progress toward functional and medical goals.  Marland KitchenFIM: FIM - Bathing Bathing Steps Patient Completed: Chest, Right Arm, Left Arm, Abdomen, Front perineal area, Buttocks, Left lower leg (including foot), Right lower leg (including foot), Left upper leg, Right upper leg Bathing: 4: Steadying assist  FIM - Upper Body Dressing/Undressing Upper body dressing/undressing steps patient completed: Thread/unthread right sleeve of pullover shirt/dresss, Thread/unthread left sleeve of pullover shirt/dress, Put head through opening of pull over shirt/dress, Pull shirt over trunk Upper body dressing/undressing: 5:  Set-up assist to: Obtain clothing/put away FIM - Lower Body Dressing/Undressing Lower body dressing/undressing steps patient completed: Thread/unthread right pants leg, Thread/unthread left pants leg, Pull pants up/down, Fasten/unfasten pants Lower body dressing/undressing: 3: Mod-Patient completed 50-74% of tasks  FIM - Toileting Toileting steps completed by patient: Adjust clothing prior to toileting, Adjust clothing after toileting Toileting Assistive Devices: Grab bar or rail for support Toileting: 5: Supervision: Safety issues/verbal cues  FIM - Radio producer Devices: Product manager Transfers: 5-To toilet/BSC: Supervision (verbal cues/safety issues), 5-From toilet/BSC: Supervision (verbal cues/safety issues)  FIM - Control and instrumentation engineer Devices: Arm rests Bed/Chair Transfer: 6: Supine > Sit: No assist, 5: Bed > Chair or W/C: Supervision (verbal cues/safety issues)  FIM - Locomotion: Wheelchair Distance: 30 Locomotion: Wheelchair: 1: Travels less than 50 ft with supervision, cueing or coaxing FIM - Locomotion: Ambulation Locomotion: Ambulation Assistive Devices: Walker - Rolling (10# weight on RW) Ambulation/Gait Assistance: 5: Supervision Locomotion: Ambulation: 2: Travels 50 - 149 ft with supervision/safety issues  Comprehension Comprehension Mode: Auditory Comprehension: 4-Understands basic 75 - 89% of the time/requires cueing 10 - 24% of the time  Expression Expression Mode: Verbal Expression: 5-Expresses basic 90% of the time/requires cueing < 10% of the time.  Social Interaction Social Interaction: 4-Interacts appropriately 75 - 89% of the time - Needs redirection for appropriate language or to initiate interaction.  Problem Solving Problem Solving: 4-Solves basic 75 - 89% of the time/requires cueing 10 - 24% of the time  Memory Memory: 3-Recognizes or recalls 50 - 74% of the time/requires cueing 25 - 49% of  the time  Medical Problem List and Plan: 1. 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  DVT Prophylaxis/Anticoagulation: SCDs. Monitor for any signs of DVT 3. Pain Management: Tylenol as needed 4. History of CVA 2 with hypercoagulable state. Coumadin on hold due to subarachnoid hemorrhage. 5. Neuropsych: This patient is capable of making decisions on his own behalf. 6. Skin/Wound Care: Routine skin checks 7. Fluids/Electrolytes/Nutrition: Strict I and O follow-up chemistries 8. Hypertension. Nimotop protocol to end 03/13/2014. Monitor with increased mobility 9. Dysphagia. Dysphagia 2 thin liquid diet. Monitor for any aspiration. Follow-up speech therapy 10. MRSA PCR screening positive. Contact precautions 11. Thrush: nystatin swish and swallow 12.  Neuropathy  chronic LLE radic with chronic Left foot drop, had AFO in past which is lost LOS (Days) 10 A FACE TO FACE EVALUATION WAS PERFORMED  Brian [name] 03/13/2014, 9:25 AM

## 2014-03-13 NOTE — Progress Notes (Signed)
Patient has raised, draining site to right groin; previous angiogram 02/20/14 to right groin. Folded 4x4 gauze applied.

## 2014-03-13 NOTE — Progress Notes (Signed)
Occupational Therapy Weekly Progress Note  Patient Details  Name: Brian [name] MRN: [id number] Date of Birth: 1946/01/31  Beginning of progress report period: March 04, 2014 End of progress report period: March 13, 2014  Today's Date: 03/13/2014 OT Individual Time: 0800-0900 OT Individual Time Calculation (min): 60 min    Patient has met 5 of 5 short term goals.  Pt has made excellent progress since admission with his balance, coordination, use of LUE, and motor planning.   Patient continues to demonstrate the following deficits: LLE incoordination and weakness that limits his independence with ambulation during ADLs and therefore will continue to benefit from skilled OT intervention to enhance overall performance with BADL.  Patient progressing toward long term goals..  Continue plan of care.  OT Short Term Goals Week 1:  OT Short Term Goal 1 (Week 1): Pt will transfer to and from toilet to w/c with supervision. OT Short Term Goal 1 - Progress (Week 1): Met OT Short Term Goal 2 (Week 1): Pt will toilet with min A. OT Short Term Goal 2 - Progress (Week 1): Met OT Short Term Goal 3 (Week 1): Pt will be set up with UB dressing. OT Short Term Goal 3 - Progress (Week 1): Met OT Short Term Goal 4 (Week 1): Pt will stand with steadying A to don pants over hips. OT Short Term Goal 4 - Progress (Week 1): Met OT Short Term Goal 5 (Week 1): Pt will be able to don pants over feet and socks with extra time.  OT Short Term Goal 5 - Progress (Week 1): Met Week 2:  OT Short Term Goal 1 (Week 2): STGs = LTGs   Skilled Therapeutic Interventions/Progress Updates:    Pt seen for BADL of eating and dressing and toilet transfers retraining focusing on LUE coordination, sit><stand, standing balance, reaching to feet, and functional transfers.Pt ate his breakfast with constant cues to take small bites, chew carefully and clear his mouth prior to the next bite.  Pt opted not to bathe, he did transfer  out of bed to w/c with close S.  He stood several times at sink during LB self care and continues to need constant cues to step his L foot out to widen his base of support as he stands with his feet right next to each other. He is now able to fully don all of his LB clothing including his AFO.  Pt practiced stand pivot transfers w/c >< toilet with grab bars with supervision and then again with his RW (with weights on bars to stabilize walker) with mod A for balance walking into bathroom. Pt transferred back to his W/c with quick release belt. Pt stated he would finish his coffee and then brush his teeth.  Therapy Documentation Precautions:  Precautions Precautions: Fall Precaution Comments: L hemi Restrictions Weight Bearing Restrictions: No      Pain: Pain Assessment Pain Assessment: No/denies pain ADL: ADL ADL Comments: Refer to FIM  See FIM for current functional status  Therapy/Group: Individual Therapy  [place] 03/13/2014, 11:42 AM

## 2014-03-13 NOTE — Progress Notes (Signed)
Physical Therapy Session Note  Patient Details  Name: Brian [name] MRN: [id number] Date of Birth: 1945-05-03  Today's Date: 03/13/2014 PT Individual Time: 1400-1510 PT Individual Time Calculation (min): 70 min   Short Term Goals: Week 2:  PT Short Term Goal 1 (Week 2): STG=LTG due to LOS  Skilled Therapeutic Interventions/Progress Updates:   Tx today focused on assessing pt's need for orthosis. Brooke Pace joined PT session and assessed pt for [employer].   Pt was received supine in bed. Supine>Sit with S, HOB elevated.    Pt propelled w/c using BUEs x 15' with S. Pt performed gait with weighted RW over hallway tile and no orthosis x 150 with Supervision.  Therapeutic activity in dynamic standing at high-low table x 5 minutes with reaching task with LUE across midline for small toolbox items for improved standing tolerance; 1 cue for increased standing BOS.   Gait trials today for observation with C. Roura: 1) >150' with weighted RW and no orthosis, 2) 27' with RW and L orthopedic knee varus/valgus control brace; no reduction in gait deviations noted in either trial. Pt continues to present with marked L hip instability, marked B Trendelenberg pattern L>R, genu varus,but minimal L foot drop. Pt also continues to need VCs to decrease scissoring and increase BOS and upright posture during gait. Gait distance is consistently limited by LBP.  Although pt reports that he has an existing back brace, but PT unable to determine how much it was used. Pt is vague in describing this current brace. At this time, pt states that "I will wear anything that helps me walk better and have less pain." Plan for family education with daughter and son, who will bring existing back brace, on Saturday 1/23.   Brooke Pace plans to return 1/22 for trial of LSO with L hip abduction control component to address lumber, hip and knee instability.  Therapy Documentation Precautions:  Precautions Precautions:  Fall Precaution Comments: L hemi Restrictions Weight Bearing Restrictions: No  See FIM for current functional status  Therapy/Group: Individual Therapy  Blenda Mounts 03/13/2014, 4:44 PM

## 2014-03-14 ENCOUNTER — Inpatient Hospital Stay (HOSPITAL_COMMUNITY): Payer: Medicare Other | Admitting: *Deleted

## 2014-03-14 ENCOUNTER — Inpatient Hospital Stay (HOSPITAL_COMMUNITY): Payer: Medicare Other | Admitting: Occupational Therapy

## 2014-03-14 ENCOUNTER — Inpatient Hospital Stay (HOSPITAL_COMMUNITY): Payer: Medicare Other | Admitting: Speech Pathology

## 2014-03-14 DIAGNOSIS — G2581 Restless legs syndrome: Secondary | ICD-10-CM

## 2014-03-14 MED ORDER — ROPINIROLE HCL 0.25 MG PO TABS
0.2500 mg | ORAL_TABLET | Freq: Every day | ORAL | Status: DC
  Administered 2014-03-14 – 2014-03-18 (×5): 0.25 mg via ORAL
  Filled 2014-03-14 (×7): qty 1

## 2014-03-14 NOTE — Progress Notes (Signed)
Physical Therapy Session Note  Patient Details  Name: Brian [name] MRN: [id number] Date of Birth: 06/07/1945  Today's Date: 03/14/2014 PT Individual Time: 0905-1005 PT Individual Time Calculation (min): 60 min   Short Term Goals: Week 2:  PT Short Term Goal 1 (Week 2): STG=LTG due to LOS  Skilled Therapeutic Interventions/Progress Updates:  Tx focused on NMR, functional mobility training, gait with RW, and stairs with bil rails. Orthotist unable to attend tx this morning, but hopefully with come this weekend to trial LSO with L hip ABD assist.    Instructed pt in NMR HEP for increased motor control for the following with cues and AAROM prn:  - bridging, clamshells, lower trunk rotation, L hip flexor stretch, and standing hip ABD. Pt had most difficulty with L hip calmshell and was unable to perform side plank effectively/safely. Pt performed each 2x10 with rest breaks prn. Continued education and discussion about DC planning and home safety.   Performed all basic transfers with close S and safety cues, including feet apart. WC<>mat and WC<>toilet.   Gait in tight spaces and busy environment including several turns and direction changes, simulating home with weighted RW and no AFO. Pt needed min-guard at times due to narrow BOS and unsafe alignment in RW.   Stair training x10 with bil rails and min-guard>>S assist with cues for sequence and safety, especially step width while descending. Pt able to make safe adjustments with cues.   Pt left up in [place] at end of tx, eager to do more work this morning.      Therapy Documentation Precautions:  Precautions Precautions: Fall Precaution Comments: L hemi Restrictions Weight Bearing Restrictions: No   Pain: Pain Assessment Pain Assessment: No/denies pain Faces Pain Scale: No hurt Pain Type: Acute pain Pain Location: Head Pain Descriptors / Indicators: Aching Pain Intervention(s): Medication (See eMAR)    Locomotion  : Ambulation Ambulation/Gait Assistance: 4: Min guard   See FIM for current functional status  Therapy/Group: Individual Therapy  Kennieth Rad, PT, DPT  03/14/2014, 10:15 AM

## 2014-03-14 NOTE — Progress Notes (Signed)
Occupational Therapy Session Note  Patient Details  Name: Brian [name] MRN: [id number] Date of Birth: 1945/04/21  Today's Date: 03/14/2014 OT Individual Time: 0800-0900 OT Individual Time Calculation (min): 60 min    Short Term Goals: Week 1:  OT Short Term Goal 1 (Week 1): Pt will transfer to and from toilet to w/c with supervision. OT Short Term Goal 1 - Progress (Week 1): Met OT Short Term Goal 2 (Week 1): Pt will toilet with min A. OT Short Term Goal 2 - Progress (Week 1): Met OT Short Term Goal 3 (Week 1): Pt will be set up with UB dressing. OT Short Term Goal 3 - Progress (Week 1): Met OT Short Term Goal 4 (Week 1): Pt will stand with steadying A to don pants over hips. OT Short Term Goal 4 - Progress (Week 1): Met OT Short Term Goal 5 (Week 1): Pt will be able to don pants over feet and socks with extra time.  OT Short Term Goal 5 - Progress (Week 1): Met Week 2:  OT Short Term Goal 1 (Week 2): STGs = LTGs   Skilled Therapeutic Interventions/Progress Updates:    Pt seen for BADL retraining with a main focus on standing balance with safe foot positioning. Pt is continuing to consistently complete squat pivot transfers with supervision, but he also continues to need constant cues to separate his feet for a wider base of support. Pt completed bathing at sink (declined shower), dressing, toileting, grooming. He then propelled his w/c for a short distance needing cues to use his L hand.  Pt went to gym to work on arm cycle for 10 min for LUE coordination with no resistance. Hand off to PT.  Therapy Documentation Precautions:  Precautions Precautions: Fall Precaution Comments: L hemi Restrictions Weight Bearing Restrictions: No     Pain: Pain Assessment Pain Assessment: No/denies pain  ADL: ADL ADL Comments: Refer to FIM  See FIM for current functional status  Therapy/Group: Individual Therapy  [place] 03/14/2014, 11:07 AM

## 2014-03-14 NOTE — Progress Notes (Signed)
Speech Language Pathology Daily Session Note  Patient Details  Name: Brian [name] MRN: [id number] Date of Birth: 1945/05/30  Today's Date: 03/14/2014 SLP Individual Time: 1100-1200 SLP Individual Time Calculation (min): 60 min  Short Term Goals: Week 2: SLP Short Term Goal 1 (Week 2): Pt will tolerate dys 3 solids and thin liquids during a functional meal with supervision cues for use of swallowing precautions.  SLP Short Term Goal 2 (Week 2): Pt will improve basic functional problem solving over 75% of observable opportunities with mod assist multimodal cues.  SLP Short Term Goal 3 (Week 2): Pt will improve recall of basic, daily information for 75% accuracy with mod assist multimodal cues for use of external aids.  SLP Short Term Goal 4 (Week 2): Pt will sustain his attention during functional tasks for 15-20 minutes with min-mod cues for redirection.  SLP Short Term Goal 5 (Week 2): Pt will attend to left environment during functional tasks over 75% of observable opportunities with mod assist.    Skilled Therapeutic Interventions:  Pt was seen for skilled ST targeting cognitive goal.s  Upon arrival, pt was seated upright in wheelchair, awake, alert,and agreeable to participate in ST.  SLP facilitated the session with a structured new learning activity targeting functional problem solving and recall of new information. Pt completed the abovementioned task with mod faded to supervision cues for category recall and min cues for mental flexibility.  Lunch tray had arrived at the end of today's therapy session, SLP reviewed and reinforced swallowing precautions due to reports of impulsivity in [employer] yesterday.  Pt verbalized understanding.  Pt left with nurse tech for full supervision during meal.  Continue per current plan of care.     FIM:  Comprehension Comprehension Mode: Auditory Comprehension: 4-Understands basic 75 - 89% of the time/requires cueing 10 - 24% of the  time Expression Expression Mode: Verbal Expression: 5-Expresses basic 90% of the time/requires cueing < 10% of the time. Social Interaction Social Interaction: 4-Interacts appropriately 75 - 89% of the time - Needs redirection for appropriate language or to initiate interaction. Problem Solving Problem Solving: 4-Solves basic 75 - 89% of the time/requires cueing 10 - 24% of the time Memory Memory: 3-Recognizes or recalls 50 - 74% of the time/requires cueing 25 - 49% of the time  Pain Pain Assessment Pain Assessment: No/denies pain Faces Pain Scale: No hurt  Therapy/Group: Individual Therapy  Aalaysia Liggins, Selinda Orion 03/14/2014, 12:21 PM

## 2014-03-14 NOTE — Progress Notes (Signed)
Patient ID: Brian [name], male   DOB: [birth year], 69 y.o.   MRN: [id number]  Subjective/Complaints:  Slept poorly, right leg restless last night. Also has some restless feeling in the left leg as well. No joint pains. No burning or numbness reported in the right side. Does have chronic numbness in the left Review of Systems - Negative except poor appetite  Objective: Vital Signs: Blood pressure 134/94, pulse 64, temperature 97.7 F (36.5 C), temperature source Oral, resp. rate 19, height 5\' 7"  (1.702 m), weight 65.137 kg (143 lb 9.6 oz), SpO2 99 %. No results found. No results found for this or any previous visit (from the past 72 hour(s)).   HEENT: normal Cardio: RRR and no murmur Resp: CTA B/L and Unlabored GI: BS positive and nontender nondistended Extremity:  Pulses positive and No Edema Skin:   Intact Neuro: Confused, Abnormal Sensory decreased left L5 dermatome and Abnormal Motor 3 minus left biceps triceps grip deltoid, 4/5 left hip flexor and knee extensor and 2- ankle dorsiflexor, no sensitivity to touch in the right foot. Negative straight leg raising on the right side Musc/Skel:  No pain with right ankle range of motion, no tenderness palpation in the right calf or tibial area. Gen. no distress, increased hip adductor tone   Assessment/Plan: 1. 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 Physiatrist is providing close team supervision and 24 hour management of active medical problems listed below. Physiatrist and rehab team continue to assess barriers to discharge/monitor patient progress toward functional and medical goals.  Marland KitchenFIM: FIM - Bathing Bathing Steps Patient Completed: Chest, Right Arm, Left Arm, Abdomen, Front perineal area, Buttocks, Left lower leg (including foot), Right lower leg (including foot), Left upper leg, Right upper leg Bathing:  4: Steadying assist  FIM - Upper Body Dressing/Undressing Upper body dressing/undressing steps patient completed: Thread/unthread right sleeve of pullover shirt/dresss, Thread/unthread left sleeve of pullover shirt/dress, Put head through opening of pull over shirt/dress, Pull shirt over trunk Upper body dressing/undressing: 5: Set-up assist to: Obtain clothing/put away FIM - Lower Body Dressing/Undressing Lower body dressing/undressing steps patient completed: Thread/unthread right pants leg, Thread/unthread left pants leg, Pull pants up/down, Fasten/unfasten pants, Thread/unthread right underwear leg, Thread/unthread left underwear leg, Pull underwear up/down, Don/Doff right sock, Don/Doff left sock, Don/Doff right shoe, Don/Doff left shoe, Fasten/unfasten right shoe, Fasten/unfasten left shoe Lower body dressing/undressing: 4: Steadying Assist  FIM - Toileting Toileting steps completed by patient: Adjust clothing prior to toileting, Adjust clothing after toileting, Performs perineal hygiene Toileting Assistive Devices: Grab bar or rail for support Toileting: 4: Steadying assist  FIM - Radio producer Devices: Product manager Transfers: 5-To toilet/BSC: Supervision (verbal cues/safety issues), 5-From toilet/BSC: Supervision (verbal cues/safety issues)  FIM - Control and instrumentation engineer Devices: Arm rests Bed/Chair Transfer: 6: Supine > Sit: No assist, 5: Bed > Chair or W/C: Supervision (verbal cues/safety issues)  FIM - Locomotion: Wheelchair Distance:  (15) Locomotion: Wheelchair: 1: Travels less than 50 ft with supervision, cueing or coaxing FIM - Locomotion: Ambulation Locomotion: Ambulation Assistive Devices: Administrator Ambulation/Gait Assistance: 5: Supervision Locomotion: Ambulation: 5: Travels 150 ft or more with supervision/safety issues  Comprehension Comprehension Mode: Auditory Comprehension: 4-Understands basic 75 -  89% of the time/requires cueing 10 - 24% of the time  Expression Expression Mode: Verbal Expression: 5-Expresses basic 90% of the time/requires cueing < 10% of the time.  Social Interaction Social Interaction: 4-Interacts appropriately 75 - 89% of the time - Needs redirection for appropriate language or to initiate interaction.  Problem Solving Problem Solving: 4-Solves basic 75 - 89% of the time/requires cueing 10 - 24% of the time  Memory Memory: 3-Recognizes or recalls 50 - 74% of the time/requires cueing 25 - 49% of the time  Medical Problem List and Plan: 1. 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 DVT Prophylaxis/Anticoagulation: SCDs. Monitor for any signs of DVT 3. Pain Management: Tylenol as needed 4. History of CVA 2 with hypercoagulable state. Coumadin on hold due to subarachnoid hemorrhage. 5. Neuropsych: This patient is capable of making decisions on his own behalf. 6. Skin/Wound Care: Routine skin checks 7. Fluids/Electrolytes/Nutrition: Strict I and O follow-up chemistries 8. Hypertension. Nimotop protocol to end 03/13/2014. Monitor with increased mobility 9. Dysphagia. Dysphagia 2 thin liquid diet. Monitor for any aspiration. Follow-up speech therapy 10. MRSA PCR screening positive. Contact precautions 11. Thrush: nystatin swish and swallow 12.  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  Insomnia with probable restless leg will trial requip LOS (Days) 11 A FACE TO FACE EVALUATION WAS PERFORMED  Rajinder Mesick E 03/14/2014, 8:15 AM

## 2014-03-15 ENCOUNTER — Encounter (HOSPITAL_COMMUNITY): Payer: Medicare Other | Admitting: Speech Pathology

## 2014-03-15 ENCOUNTER — Inpatient Hospital Stay (HOSPITAL_COMMUNITY): Payer: Medicare Other | Admitting: Physical Therapy

## 2014-03-15 ENCOUNTER — Inpatient Hospital Stay (HOSPITAL_COMMUNITY): Payer: Medicare Other | Admitting: *Deleted

## 2014-03-15 NOTE — Progress Notes (Signed)
Physical Therapy Session Note  Patient Details  Name: Brian [name] MRN: [id number] Date of Birth: 09/01/1945  Today's Date: 03/15/2014 PT Individual Time: 1300-1400 PT Individual Time Calculation (min): 60 min   Short Term Goals: Week 1:  PT Short Term Goal 1 (Week 1): Pt will perform all basic transfers with S PT Short Term Goal 1 - Progress (Week 1): Progressing toward goal PT Short Term Goal 2 (Week 1): Pt will be able to reach 10" outside BOS with Min A for functional reaching PT Short Term Goal 2 - Progress (Week 1): Met PT Short Term Goal 3 (Week 1): Pt will performed WC mobility x100' with 1 cue for L attention PT Short Term Goal 3 - Progress (Week 1): Met PT Short Term Goal 4 (Week 1): Pt will ambulate 100' with RW and Min A PT Short Term Goal 4 - Progress (Week 1): Met PT Short Term Goal 5 (Week 1): Pt will ascend/descend 5 stairs with 2 rails and Min A  PT Short Term Goal 5 - Progress (Week 1): Met Week 2:  PT Short Term Goal 1 (Week 2): STG=LTG due to LOS  Skilled Therapeutic Interventions/Progress Updates:    Pt limited by fatigue and poor core control in gait with increased scissoring noted as slumped posture increased with longer gait distance. Pt reports being limied by LBP so myofascial releases to low back performed, although limited results noted, Pt's family ultimately did not attend session today secondary to weather limiting possible back brace use or further clarification of pre-admission function.  Therapy Documentation Precautions:  Precautions Precautions: Fall Precaution Comments: L hemi Restrictions Weight Bearing Restrictions: No General:   Vital Signs: Therapy Vitals Temp: 98 F (36.7 C) Temp Source: Oral Pulse Rate: 90 Resp: 16 BP: 108/79 mmHg Patient Position (if appropriate): Sitting Oxygen Therapy SpO2: 99 % Mobility:  Pt performs transfers with cues for weight shift, sequencing, and technique Locomotion : Ambulation Ambulation/Gait  Assistance: 4: Min guard Wheelchair Mobility Distance: 25  Other Treatments:   Pt performs transfers x20 in session. Pt performs static and dynamic sitting balance tasks within functional context including reaching, weight shifting, and grasping. Pt educated on rehab plan, safety in mobility, and core stability's influence on mobility and pain. Pt performs static standing balance 1'x 5. Pt performs pre-gait BLE advancement and weight shifting 2x10. Myofascial release to quadratus lumborum, paraspinals, and L/S scar.  See FIM for current functional status  Therapy/Group: Individual Therapy  Monia Pouch 03/15/2014, 4:41 PM

## 2014-03-15 NOTE — Progress Notes (Signed)
Speech Language Pathology Daily Session Note  Patient Details  Name: Brian [name] MRN: [id number] Date of Birth: January 13, 1946  Today's Date: 03/15/2014 SLP Individual Time: 7471-8550 SLP Individual Time Calculation (min): 30 min  Short Term Goals: Week 2: SLP Short Term Goal 1 (Week 2): Pt will tolerate dys 3 solids and thin liquids during a functional meal with supervision cues for use of swallowing precautions.  SLP Short Term Goal 2 (Week 2): Pt will improve basic functional problem solving over 75% of observable opportunities with mod assist multimodal cues.  SLP Short Term Goal 3 (Week 2): Pt will improve recall of basic, daily information for 75% accuracy with mod assist multimodal cues for use of external aids.  SLP Short Term Goal 4 (Week 2): Pt will sustain his attention during functional tasks for 15-20 minutes with min-mod cues for redirection.  SLP Short Term Goal 5 (Week 2): Pt will attend to left environment during functional tasks over 75% of observable opportunities with mod assist.    Skilled Therapeutic Interventions:  Pt was seen for skilled ST targeting cognitive goals.  Pt was initially scheduled for family education; however, pt's family was not present for training on this date.  Therefore, SLP preceded with skilled treatment.  SLP facilitated the session with a visuospatial construction task targeting functional problem solving.  Pt required overall mod-max assist multimodal cues to complete the abovementioned task due to left inattention, decreased working memory, and decreased thought organization/impulsivity.  Continue per current plan of care.     FIM:  Comprehension Comprehension Mode: Auditory Comprehension: 5-Follows basic conversation/direction: With extra time/assistive device Expression Expression Mode: Verbal Expression: 5-Expresses basic 90% of the time/requires cueing < 10% of the time. Social Interaction Social Interaction: 4-Interacts appropriately 75  - 89% of the time - Needs redirection for appropriate language or to initiate interaction. Problem Solving Problem Solving: 4-Solves basic 75 - 89% of the time/requires cueing 10 - 24% of the time Memory Memory: 4-Recognizes or recalls 75 - 89% of the time/requires cueing 10 - 24% of the time  Pain Pain Assessment Pain Assessment: No/denies pain  Therapy/Group: Individual Therapy  Brian [name], Brian [name] 03/15/2014, 7:27 PM

## 2014-03-15 NOTE — Progress Notes (Signed)
Patient ID: Brian [name], male   DOB: 1945/03/25, 68 y.o.   MRN: [id number]  Subjective/Complaints:  Slept better last night. Slow to arouse this morning Review of Systems - Negative except poor appetite  Objective: Vital Signs: Blood pressure 150/80, pulse 75, temperature 97.5 F (36.4 C), temperature source Oral, resp. rate 17, height 5\' 7"  (1.702 m), weight 65.137 kg (143 lb 9.6 oz), SpO2 100 %. No results found. No results found for this or any previous visit (from the past 72 hour(s)).   HEENT: normal Cardio: RRR and no murmur Resp: CTA B/L and Unlabored GI: BS positive and nontender nondistended Extremity:  Pulses positive and No Edema Skin:   Intact Neuro: Confused, Abnormal Sensory decreased left L5 dermatome and Abnormal Motor 3 minus left biceps triceps grip deltoid, 4/5 left hip flexor and knee extensor and 2- ankle dorsiflexor, no sensitivity to touch in the right foot. Negative straight leg raising on the right side Musc/Skel:  No pain with right ankle range of motion, no tenderness palpation in the right calf or tibial area. Gen. no distress, increased hip adductor tone   Assessment/Plan: 1. 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 Physiatrist is providing close team supervision and 24 hour management of active medical problems listed below. Physiatrist and rehab team continue to assess barriers to discharge/monitor patient progress toward functional and medical goals.    Marland KitchenFIM: FIM - Bathing Bathing Steps Patient Completed: Chest, Right Arm, Left Arm, Abdomen, Front perineal area, Buttocks, Left lower leg (including foot), Right lower leg (including foot), Left upper leg, Right upper leg Bathing: 4: Steadying assist  FIM - Upper Body Dressing/Undressing Upper body dressing/undressing steps patient completed: Thread/unthread right sleeve of  pullover shirt/dresss, Thread/unthread left sleeve of pullover shirt/dress, Put head through opening of pull over shirt/dress, Pull shirt over trunk Upper body dressing/undressing: 5: Set-up assist to: Obtain clothing/put away FIM - Lower Body Dressing/Undressing Lower body dressing/undressing steps patient completed: Thread/unthread right pants leg, Thread/unthread left pants leg, Pull pants up/down, Fasten/unfasten pants, Thread/unthread right underwear leg, Thread/unthread left underwear leg, Pull underwear up/down, Don/Doff right sock, Don/Doff left sock, Don/Doff left shoe, Fasten/unfasten left shoe Lower body dressing/undressing: 4: Min-Patient completed 75 plus % of tasks  FIM - Toileting Toileting steps completed by patient: Adjust clothing prior to toileting, Adjust clothing after toileting, Performs perineal hygiene Toileting Assistive Devices: Grab bar or rail for support Toileting: 4: Steadying assist  FIM - Radio producer Devices: Product manager Transfers: 5-To toilet/BSC: Supervision (verbal cues/safety issues), 5-From toilet/BSC: Supervision (verbal cues/safety issues)  FIM - Control and instrumentation engineer Devices: Walker, Arm rests Bed/Chair Transfer: 5: Supine > Sit: Supervision (verbal cues/safety issues), 5: Bed > Chair or W/C: Supervision (verbal cues/safety issues)  FIM - Locomotion: Wheelchair Distance:  (15) Locomotion: Wheelchair: 1: Travels less than 50 ft with supervision, cueing or coaxing FIM - Locomotion: Ambulation Locomotion: Ambulation Assistive Devices: Administrator Ambulation/Gait Assistance: 4: Min guard Locomotion: Ambulation: 1: Travels less than 50 ft with minimal assistance (Pt.>75%)  Comprehension Comprehension Mode: Auditory Comprehension: 5-Follows basic conversation/direction: With no assist  Expression Expression Mode: Verbal Expression: 5-Expresses basic 90% of the time/requires cueing <  10% of the time.  Social Interaction Social Interaction: 4-Interacts appropriately 75 - 89% of the time - Needs redirection for appropriate language or to initiate interaction.  Problem Solving Problem Solving: 4-Solves basic 75 -  89% of the time/requires cueing 10 - 24% of the time  Memory Memory: 4-Recognizes or recalls 75 - 89% of the time/requires cueing 10 - 24% of the time  Medical Problem List and Plan: 1. 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 DVT Prophylaxis/Anticoagulation: SCDs. Monitor for any signs of DVT 3. Pain Management: Tylenol as needed 4. History of CVA 2 with hypercoagulable state. Coumadin on hold due to subarachnoid hemorrhage. 5. Neuropsych: This patient is capable of making decisions on his own behalf. 6. Skin/Wound Care: Routine skin checks 7. Fluids/Electrolytes/Nutrition: encourage PO 8. Hypertension. Nimotop protocol to end 03/13/2014. Monitor with increased mobility 9. Dysphagia. Dysphagia 2 thin liquid diet. Monitor for any aspiration. Follow-up speech therapy 10. MRSA PCR screening positive. Contact precautions 11. Thrush: nystatin swish and swallow 12.  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  Insomnia with probable restless leg--requip LOS (Days) 12 A FACE TO FACE EVALUATION WAS PERFORMED  SWARTZ,ZACHARY T 03/15/2014, 11:57 AM

## 2014-03-15 NOTE — Progress Notes (Signed)
Occupational Therapy Session Note  Patient Details  Name: Brian [name] MRN: [id number] Date of Birth: [birth year]  Today's Date: 03/15/2014 OT Individual Time: 1430-1515 OT Individual Time Calculation (min): 45 min    Short Term Goals: Week 1:  OT Short Term Goal 1 (Week 1): Pt will transfer to and from toilet to w/c with supervision. OT Short Term Goal 1 - Progress (Week 1): Met OT Short Term Goal 2 (Week 1): Pt will toilet with min A. OT Short Term Goal 2 - Progress (Week 1): Met OT Short Term Goal 3 (Week 1): Pt will be set up with UB dressing. OT Short Term Goal 3 - Progress (Week 1): Met OT Short Term Goal 4 (Week 1): Pt will stand with steadying A to don pants over hips. OT Short Term Goal 4 - Progress (Week 1): Met OT Short Term Goal 5 (Week 1): Pt will be able to don pants over feet and socks with extra time.  OT Short Term Goal 5 - Progress (Week 1): Met Week 2:  OT Short Term Goal 1 (Week 2): STGs = LTGs      Skilled Therapeutic Interventions/Progress Updates:    Addressed LUE NMRE.  Increased tightness palpated under axilla.  Performed myotherapy and AROM to decrease tightness and pain.  Pt. Jasper felt good and it helped him to relax.  He reported he was unable to sleep last night until he took a sleeping pill.  He transferred to bed at end of session because he feft so relaxed.  Left pt in bed with with bed alarm on and all needs in reach.    Therapy Documentation Precautions:  Precautions Precautions: Fall Precaution Comments: L hemi Restrictions Weight Bearing Restrictions: No     Pain:  4/10 Left axilla and upper back   ADL: ADL ADL Comments: Refer to FIM :   :    See FIM for current functional status  Therapy/Group: Individual Therapy  Lisa Roca 03/15/2014, 6:24 PM

## 2014-03-16 ENCOUNTER — Encounter (HOSPITAL_COMMUNITY): Payer: Medicare Other

## 2014-03-16 ENCOUNTER — Inpatient Hospital Stay (HOSPITAL_COMMUNITY): Payer: Medicare Other | Admitting: Physical Therapy

## 2014-03-16 NOTE — Progress Notes (Signed)
Patient ID: Brian [name], male   DOB: [birth year], 69 y.o.   MRN: [id number]  Subjective/Complaints:  No problems reported per RN. Sleeping soundly upon my arrival. Slow to awaken. Review of Systems - Negative except poor appetite  Objective: Vital Signs: Blood pressure 126/84, pulse 73, temperature 97.4 F (36.3 C), temperature source Oral, resp. rate 17, height 5\' 7"  (1.702 m), weight 65.137 kg (143 lb 9.6 oz), SpO2 100 %. No results found. No results found for this or any previous visit (from the past 72 hour(s)).   HEENT: normal Cardio: RRR and no murmur Resp: CTA B/L and Unlabored GI: BS positive and nontender nondistended Extremity:  Pulses positive and No Edema Skin:   Intact Neuro: Confused, Abnormal Sensory decreased left L5 dermatome and Abnormal Motor 3 minus left biceps triceps grip deltoid, 4/5 left hip flexor and knee extensor and 2- ankle dorsiflexor, no sensitivity to touch in the right foot. Negative straight leg raising on the right side Musc/Skel:  No pain with right ankle range of motion, no tenderness palpation in the right calf or tibial area. Gen. no distress, increased hip adductor tone   Assessment/Plan: 1. 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 Physiatrist is providing close team supervision and 24 hour management of active medical problems listed below. Physiatrist and rehab team continue to assess barriers to discharge/monitor patient progress toward functional and medical goals.    Marland KitchenFIM: FIM - Bathing Bathing Steps Patient Completed: Chest, Right Arm, Left Arm, Abdomen, Front perineal area, Buttocks, Left lower leg (including foot), Right lower leg (including foot), Left upper leg, Right upper leg Bathing: 4: Steadying assist  FIM - Upper Body Dressing/Undressing Upper body dressing/undressing steps patient completed:  Thread/unthread right sleeve of pullover shirt/dresss, Thread/unthread left sleeve of pullover shirt/dress, Put head through opening of pull over shirt/dress, Pull shirt over trunk Upper body dressing/undressing: 5: Set-up assist to: Obtain clothing/put away FIM - Lower Body Dressing/Undressing Lower body dressing/undressing steps patient completed: Thread/unthread right pants leg, Thread/unthread left pants leg, Pull pants up/down, Fasten/unfasten pants, Thread/unthread right underwear leg, Thread/unthread left underwear leg, Pull underwear up/down, Don/Doff right sock, Don/Doff left sock, Don/Doff left shoe, Fasten/unfasten left shoe Lower body dressing/undressing: 4: Min-Patient completed 75 plus % of tasks  FIM - Toileting Toileting steps completed by patient: Adjust clothing prior to toileting, Adjust clothing after toileting, Performs perineal hygiene Toileting Assistive Devices: Grab bar or rail for support Toileting: 4: Steadying assist  FIM - Radio producer Devices: Grab bars Toilet Transfers: 5-To toilet/BSC: Supervision (verbal cues/safety issues), 5-From toilet/BSC: Supervision (verbal cues/safety issues)  FIM - Control and instrumentation engineer Devices: Walker, Arm rests Bed/Chair Transfer: 5: Supine > Sit: Supervision (verbal cues/safety issues), 5: Sit > Supine: Supervision (verbal cues/safety issues), 5: Bed > Chair or W/C: Supervision (verbal cues/safety issues), 5: Chair or W/C > Bed: Supervision (verbal cues/safety issues)  FIM - Locomotion: Wheelchair Distance: 25 Locomotion: Wheelchair: 1: Travels less than 50 ft with supervision, cueing or coaxing FIM - Locomotion: Ambulation Locomotion: Ambulation Assistive Devices: Environmental consultant - Rolling (15'x4) Ambulation/Gait Assistance: 4: Min guard Locomotion: Ambulation: 1: Travels less than 50 ft with minimal assistance (Pt.>75%)  Comprehension Comprehension Mode: Auditory Comprehension:  5-Follows basic conversation/direction: With extra time/assistive device  Expression Expression Mode: Verbal Expression: 5-Expresses basic 90% of the time/requires cueing < 10% of the time.  Social Interaction Social Interaction: 4-Interacts appropriately 75 -  89% of the time - Needs redirection for appropriate language or to initiate interaction.  Problem Solving Problem Solving: 4-Solves basic 75 - 89% of the time/requires cueing 10 - 24% of the time  Memory Memory: 4-Recognizes or recalls 75 - 89% of the time/requires cueing 10 - 24% of the time  Medical Problem List and Plan: 1. 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 DVT Prophylaxis/Anticoagulation: SCDs. Monitor for any signs of DVT 3. Pain Management: Tylenol as needed 4. History of CVA 2 with hypercoagulable state. Coumadin on hold due to subarachnoid hemorrhage. 5. Neuropsych: This patient is capable of making decisions on his own behalf. 6. Skin/Wound Care: Routine skin checks 7. Fluids/Electrolytes/Nutrition: encourage PO 8. Hypertension. Nimotop protocol to end 03/13/2014. Monitor with increased mobility 9. Dysphagia. Dysphagia 2 thin liquid diet. Monitor for any aspiration. Follow-up speech therapy 10. MRSA PCR screening positive. Contact precautions 11. Thrush: nystatin swish and swallow 12.  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  Insomnia with probable restless leg--requip--sleeping better with this---no reports of morning sedation with medication  -continue current dose LOS (Days) 13 A FACE TO FACE EVALUATION WAS PERFORMED  Brian [name] 03/16/2014, 8:57 AM

## 2014-03-16 NOTE — Progress Notes (Signed)
Occupational Therapy Session Note  Patient Details  Name: Brian [name] MRN: [id number] Date of Birth: Oct 18, 1945  Today's Date: 03/16/2014 OT Individual Time: 4742-5956 OT Individual Time Calculation (min): 45 min    Short Term Goals: Week 2:  OT Short Term Goal 1 (Week 2): STGs = LTGs   Skilled Therapeutic Interventions/Progress Updates:    Pt seen for ADL retraining with focus on functional transfers, standing balance, safety awareness, and attention to L. Pt received supine in bed agreeable to bathing at shower level. Completed squat pivot transfers bed>w/c and w/c<>shower chair at supervision level with mod cues for positioning of LLE. Completed dressing from w/c with min A for donning L sock following multiple attempts. Pt stood at [facility] for balance to manage clothing up. Engaged in oral care in standing with CGA-SBA for balance for 1 min. Pt requesting to return to bed at end of session, completing squat pivot transfer w/c>bed at supervision level and cues for setup of transfer. Pt left supine in bed with all needs in reach.   Therapy Documentation Precautions:  Precautions Precautions: Fall Precaution Comments: L hemi Restrictions Weight Bearing Restrictions: No General:   Vital Signs:   Pain: No report of pain  See FIM for current functional status  Therapy/Group: Individual Therapy  Duayne Cal 03/16/2014, 9:27 AM

## 2014-03-17 ENCOUNTER — Inpatient Hospital Stay (HOSPITAL_COMMUNITY): Payer: Medicare Other | Admitting: Occupational Therapy

## 2014-03-17 ENCOUNTER — Inpatient Hospital Stay (HOSPITAL_COMMUNITY): Payer: Medicare Other

## 2014-03-17 ENCOUNTER — Inpatient Hospital Stay (HOSPITAL_COMMUNITY): Payer: Medicare Other | Admitting: Speech Pathology

## 2014-03-17 DIAGNOSIS — M5416 Radiculopathy, lumbar region: Secondary | ICD-10-CM

## 2014-03-17 NOTE — Progress Notes (Signed)
Patient ID: Brian [name], male   DOB: 08/23/1945, 69 y.o.   MRN: [id number]  Subjective/Complaints: Discussed gait pattern with PT. Combination of hip adductor spasticityalong with hip abductor weakness Chronic low back pain, mild foot drop Review of Systems - Negative except poor appetite  Objective: Vital Signs: Blood pressure 137/80, pulse 76, temperature 98.2 F (36.8 C), temperature source Oral, resp. rate 18, height 5\' 7"  (1.702 m), weight 65.137 kg (143 lb 9.6 oz), SpO2 100 %. No results found. No results found for this or any previous visit (from the past 72 hour(s)).   HEENT: normal Cardio: RRR and no murmur Resp: CTA B/L and Unlabored GI: BS positive and nontender nondistended Extremity:  Pulses positive and No Edema Skin:   Intact Neuro: Confused, Abnormal Sensory decreased left L5 dermatome and Abnormal Motor 3 minus left biceps triceps grip deltoid, 4/5 left hip flexor and knee extensor and 3- ankle dorsiflexor, no sensitivity to touch in the right foot. Negative straight leg raising on the right side Musc/Skel:  No pain with right ankle range of motion, no tenderness palpation in the right calf or tibial area. Gen. no distress, Adductor tone ok in supine with hips flexed   Assessment/Plan: 1. 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 Physiatrist is providing close team supervision and 24 hour management of active medical problems listed below. Physiatrist and rehab team continue to assess barriers to discharge/monitor patient progress toward functional and medical goals.    Marland KitchenFIM: FIM - Bathing Bathing Steps Patient Completed: Chest, Right Arm, Left Arm, Abdomen, Front perineal area, Buttocks, Left lower leg (including foot), Right lower leg (including foot), Left upper leg, Right upper leg Bathing: 5: Supervision: Safety issues/verbal  cues  FIM - Upper Body Dressing/Undressing Upper body dressing/undressing steps patient completed: Thread/unthread right sleeve of pullover shirt/dresss, Thread/unthread left sleeve of pullover shirt/dress, Put head through opening of pull over shirt/dress, Pull shirt over trunk Upper body dressing/undressing: 5: Set-up assist to: Obtain clothing/put away FIM - Lower Body Dressing/Undressing Lower body dressing/undressing steps patient completed: Thread/unthread right pants leg, Thread/unthread left pants leg, Pull pants up/down, Fasten/unfasten pants, Thread/unthread right underwear leg, Thread/unthread left underwear leg, Pull underwear up/down, Don/Doff left shoe, Fasten/unfasten right shoe, Fasten/unfasten left shoe, Don/Doff right shoe Lower body dressing/undressing: 4: Min-Patient completed 75 plus % of tasks  FIM - Toileting Toileting steps completed by patient: Adjust clothing prior to toileting, Adjust clothing after toileting, Performs perineal hygiene Toileting Assistive Devices: Grab bar or rail for support Toileting: 4: Steadying assist  FIM - Radio producer Devices: Product manager Transfers: 5-To toilet/BSC: Supervision (verbal cues/safety issues), 5-From toilet/BSC: Supervision (verbal cues/safety issues)  FIM - Control and instrumentation engineer Devices: Arm rests Bed/Chair Transfer: 5: Supine > Sit: Supervision (verbal cues/safety issues), 5: Bed > Chair or W/C: Supervision (verbal cues/safety issues)  FIM - Locomotion: Wheelchair Distance: 25 Locomotion: Wheelchair: 1: Travels less than 50 ft with supervision, cueing or coaxing FIM - Locomotion: Ambulation Locomotion: Ambulation Assistive Devices: Environmental consultant - Rolling (15'x4) Ambulation/Gait Assistance: 4: Min guard Locomotion: Ambulation: 1: Travels less than 50 ft with minimal assistance (Pt.>75%)  Comprehension Comprehension Mode: Auditory Comprehension: 5-Understands basic  90% of the time/requires cueing < 10% of the time  Expression Expression Mode: Verbal Expression: 5-Expresses basic 90% of the time/requires cueing < 10% of the time.  Social Interaction Social Interaction: 4-Interacts appropriately 75 -  89% of the time - Needs redirection for appropriate language or to initiate interaction.  Problem Solving Problem Solving: 4-Solves basic 75 - 89% of the time/requires cueing 10 - 24% of the time  Memory Memory: 4-Recognizes or recalls 75 - 89% of the time/requires cueing 10 - 24% of the time  Medical Problem List and Plan: 1. 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 DVT Prophylaxis/Anticoagulation: SCDs. Monitor for any signs of DVT 3. Pain Management: Tylenol as needed 4. History of CVA 2 with hypercoagulable state. Coumadin on hold due to subarachnoid hemorrhage. 5. Neuropsych: This patient is capable of making decisions on his own behalf. 6. Skin/Wound Care: Routine skin checks 7. Fluids/Electrolytes/Nutrition: encourage PO 8. Hypertension. Nimotop protocol to end 03/13/2014. Monitor with increased mobility 9. Dysphagia. Dysphagia 2 thin liquid diet. Monitor for any aspiration. Follow-up speech therapy 10. MRSA PCR screening positive. Contact precautions 11. Thrush: nystatin swish and swallow 12.  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  Insomnia with probable restless leg--requip--sleeping better with this---no reports of morning sedation with medication  -continue current dose 14. Spasticity left lower extremity, may benefit from botox although this woun not address hip abd weakness LOS (Days) 14 A FACE TO FACE EVALUATION WAS PERFORMED  KIRSTEINS,ANDREW E 03/17/2014, 9:50 AM

## 2014-03-17 NOTE — Progress Notes (Signed)
Physical Therapy Session Note  Patient Details  Name: SALOMON GANSER MRN: [id number] Date of Birth: [birth year]  Today's Date: 03/17/2014 PT Individual Time: 0905-1005 PT Individual Time Calculation (min): 60 min    Short Term Goals:    Week 2: PT Short Term Goal 1: STG=LTG due to LOS  Skilled Therapeutic Interventions/Progress Updates:   Pt reports that he had a full night of sleep and felt good.  Pt reports that he is impressed with his progress.   Therapeutic activity in standing with mobility tasks for improved activity tolerance:  -Gait with Rollator > 150' and Min A with cues for correct technique using Rollator device.  - Flight (12) stairs in stairway using 1 rail (RUE) and Min A with cues for foot placement and technique, using step-to pattern.  -all Transfers with S for safety technique.  Therapeutic exercise in supine on high/low table with core stabilization and stretching exercises in order to decrease musculoskeletal stress on low back to reduce pain. 3 main exercises today using HEP handout:  - Seated Bil hamstring stretches 2 x 30 seconds   - Supine Lumber rotations performed Bil x5 x 30 second holds  - Supine L hip flexor stretch 2 x 30 second hold, L foot elevated on 2' step for support  Gait training with Rollator x 10 min x 150'; Min A with cues for technique and safety using device.  Neuromuscular reeducation during gait with VCs to decrease scissoring, increase step width, and hold posture for improved dynamic BOS during mobility.  MD visited therapy session today during therex in gym. MD was consulted regarding progress on plan for orthosis and spasticity management.  Pt demonstrated good participation today, attributed to full night of sleep. Pt reported that he could feel pain in his back due to activity today. PT advised RN. Pt was propelled to room, transferred sit > supine with S to rest before next therapy. Bed alarm set and all needs within reach.    Plan for graduation day tomorrow, 1/26. Pt needs family education, time tbd.  Waiting on communication from Brooke Pace re: orthosis. Richrd Humbles was unable to see pt. this past weekend.   Therapy Documentation Precautions:  Precautions Precautions: Fall Precaution Comments: L hemi Restrictions Weight Bearing Restrictions: No Pain: Pain Assessment Pain Assessment: No/denies pain   Locomotion : Ambulation Ambulation/Gait Assistance: 4: Min guard   See FIM for current functional status  Therapy/Group: Individual Therapy  Blenda Mounts 03/17/2014, 10:14 AM

## 2014-03-17 NOTE — Progress Notes (Signed)
Speech Language Pathology Daily Session Note  Patient Details  Name: Brian [name] MRN: [id number] Date of Birth: [birth year]  Today's Date: 03/17/2014 SLP Individual Time: 1125-1210 SLP Individual Time Calculation (min): 45 min  Short Term Goals: Week 2: SLP Short Term Goal 1 (Week 2): Pt will tolerate dys 3 solids and thin liquids during a functional meal with supervision cues for use of swallowing precautions.  SLP Short Term Goal 2 (Week 2): Pt will improve basic functional problem solving over 75% of observable opportunities with mod assist multimodal cues.  SLP Short Term Goal 3 (Week 2): Pt will improve recall of basic, daily information for 75% accuracy with mod assist multimodal cues for use of external aids.  SLP Short Term Goal 4 (Week 2): Pt will sustain his attention during functional tasks for 15-20 minutes with min-mod cues for redirection.  SLP Short Term Goal 5 (Week 2): Pt will attend to left environment during functional tasks over 75% of observable opportunities with mod assist.    Skilled Therapeutic Interventions: Skilled treatment session focused on addressing dysphagia and cognition goals.  SLP administered trials of upgraded regular textures with thin liquids via cup.  Within the context of a snack patient was Mod I with use of safe swallow strategies; however, given documented need for increased cues with meal recommend to not advance until patient can demonstrate carryover of strategies throughout a meal. SLP also facilitated session with Min question cues to problem solve tray set-up and utilize small portions and a slow pace during lunch.  Patient consumed Dys.3 textures and thin liquids; cuing was effective at preventing overt s/s of aspiration as a result, recommend to continue with current plan of care.    FIM:  Comprehension Comprehension Mode: Auditory Comprehension: 5-Follows basic conversation/direction: With extra time/assistive  device Expression Expression Mode: Verbal Expression: 5-Expresses basic 90% of the time/requires cueing < 10% of the time. Social Interaction Social Interaction: 4-Interacts appropriately 75 - 89% of the time - Needs redirection for appropriate language or to initiate interaction. Problem Solving Problem Solving: 4-Solves basic 75 - 89% of the time/requires cueing 10 - 24% of the time Memory Memory: 4-Recognizes or recalls 75 - 89% of the time/requires cueing 10 - 24% of the time FIM - Eating Eating Activity: 5: Needs verbal cues/supervision  Pain Pain Assessment Pain Assessment: No/denies pain  Therapy/Group: Individual Therapy  Carmelia Roller., CCC-SLP [phone]  [place] 03/17/2014, 12:00 PM

## 2014-03-17 NOTE — Progress Notes (Signed)
Speech Language Pathology Daily Session Note  Patient Details  Name: Brian [name] MRN: [id number] Date of Birth: 01-01-1946  Today's Date: 03/17/2014 SLP Individual Time: 1103-1594 SLP Individual Time Calculation (min): 22 min  Short Term Goals: Week 2: SLP Short Term Goal 1 (Week 2): Pt will tolerate dys 3 solids and thin liquids during a functional meal with supervision cues for use of swallowing precautions.  SLP Short Term Goal 2 (Week 2): Pt will improve basic functional problem solving over 75% of observable opportunities with mod assist multimodal cues.  SLP Short Term Goal 3 (Week 2): Pt will improve recall of basic, daily information for 75% accuracy with mod assist multimodal cues for use of external aids.  SLP Short Term Goal 4 (Week 2): Pt will sustain his attention during functional tasks for 15-20 minutes with min-mod cues for redirection.  SLP Short Term Goal 5 (Week 2): Pt will attend to left environment during functional tasks over 75% of observable opportunities with mod assist.    Skilled Therapeutic Interventions: Skilled treatment focused on cognitive and swallowing goals. SLP facilitated session with structured cognitive task utilizing daily schedule. Pt required Mod cues to attend to the left side of the page as well as for working memory and basic problem solving to answer questions and perform simple time calculations. Pt also consumed several bites of ice cream with supervision level cueing and no overt signs of aspiration.   FIM:  Comprehension Comprehension Mode: Auditory Comprehension: 5-Follows basic conversation/direction: With extra time/assistive device Expression Expression Mode: Verbal Expression: 5-Expresses basic 90% of the time/requires cueing < 10% of the time. Social Interaction Social Interaction: 4-Interacts appropriately 75 - 89% of the time - Needs redirection for appropriate language or to initiate interaction. Problem Solving Problem  Solving: 3-Solves basic 50 - 74% of the time/requires cueing 25 - 49% of the time Memory Memory: 4-Recognizes or recalls 75 - 89% of the time/requires cueing 10 - 24% of the time FIM - Eating Eating Activity: 5: Supervision/cues  Pain Pain Assessment Pain Assessment: No/denies pain  Therapy/Group: Individual Therapy   Germain Osgood, M.A. CCC-SLP [phone]  Germain Osgood 03/17/2014, 4:29 PM

## 2014-03-17 NOTE — Progress Notes (Signed)
Social Work Patient ID: AEDAN GEIMER, male   DOB: 10-Jul-1945, 69 y.o.   MRN: [id number] Spoke with daughter she was unable to come in due to weather on Sat, she can be here on 3;00 pm on Wed and go through some education and show her brother. She doesn't plan to drive and get him and drive back to take both home on Wed.  She will be leaving work early on Wed to do this.

## 2014-03-17 NOTE — Progress Notes (Signed)
Speech Language Pathology Weekly Progress and Session Note  Patient Details  Name: Brian [name] MRN: [id number] Date of Birth: [birth year]  Beginning of progress report period: March 10, 2013 End of progress report period: March 17, 2013  Short Term Goals: Week 2: SLP Short Term Goal 1 (Week 2): Pt will tolerate dys 3 solids and thin liquids during a functional meal with supervision cues for use of swallowing precautions.  SLP Short Term Goal 1 - Progress (Week 2): Progressing toward goal SLP Short Term Goal 2 (Week 2): Pt will improve basic functional problem solving over 75% of observable opportunities with mod assist multimodal cues.  SLP Short Term Goal 2 - Progress (Week 2): Progressing toward goal SLP Short Term Goal 3 (Week 2): Pt will improve recall of basic, daily information for 75% accuracy with mod assist multimodal cues for use of external aids.  SLP Short Term Goal 3 - Progress (Week 2): Progressing toward goal SLP Short Term Goal 4 (Week 2): Pt will sustain his attention during functional tasks for 15-20 minutes with min-mod cues for redirection.  SLP Short Term Goal 4 - Progress (Week 2): Progressing toward goal SLP Short Term Goal 5 (Week 2): Pt will attend to left environment during functional tasks over 75% of observable opportunities with mod assist.   SLP Short Term Goal 5 - Progress (Week 2): Met    New Short Term Goals: Week 3: SLP Short Term Goal 1 (Week 3): Pt will tolerate dys 3 solids and thin liquids during a functional meal with supervision cues for use of swallowing precautions.  SLP Short Term Goal 2 (Week 3): Pt will demonstrate basic functional problem solving with mod assist multimodal cues.  SLP Short Term Goal 3 (Week 3): Pt will utilize external aids to assist with recall of daily information with mod multimodal cues.  SLP Short Term Goal 4 (Week 3): Pt will sustain his attention to functional tasks for 15-20 minutes with min cues for redirection.   SLP Short Term Goal 5 (Week 3): Pt will attend to left environment during functional tasks with min assist.    Weekly Progress Updates: Patient met 1 out of 5 short term goals this reporting period due to gains in left attention with Mod multimodal cues.  Patient made functional gains in the other areas of cognition but not to the cuing level.  As a result, goals were continued into the next reporting period.  Patient and family education is ongoing and patient continues to require skilled SLP services to address deficits, maximize functional independence and reduce overall burden of care  prior to discharge home with 24/7 supervision.     Intensity: Minumum of 1-2 x/day, 30 to 90 minutes Frequency: 5 out of 7 days Duration/Length of Stay: 14 days  Treatment/Interventions: Cognitive remediation/compensation;Cueing hierarchy;Dysphagia/aspiration precaution training;Functional tasks;Patient/family education;Internal/external aids;Environmental controls   Carmelia Roller., CCC-SLP [phone]   [place] 03/17/2014, 5:06 PM

## 2014-03-17 NOTE — Progress Notes (Signed)
Occupational Therapy Session Note  Patient Details  Name: Brian [name] MRN: [id number] Date of Birth: May 28, 1945  Today's Date: 03/17/2014 OT Individual Time: 0800-0900 OT Individual Time Calculation (min): 60 min    Short Term Goals: Week 1:  OT Short Term Goal 1 (Week 1): Pt will transfer to and from toilet to w/c with supervision. OT Short Term Goal 1 - Progress (Week 1): Met OT Short Term Goal 2 (Week 1): Pt will toilet with min A. OT Short Term Goal 2 - Progress (Week 1): Met OT Short Term Goal 3 (Week 1): Pt will be set up with UB dressing. OT Short Term Goal 3 - Progress (Week 1): Met OT Short Term Goal 4 (Week 1): Pt will stand with steadying A to don pants over hips. OT Short Term Goal 4 - Progress (Week 1): Met OT Short Term Goal 5 (Week 1): Pt will be able to don pants over feet and socks with extra time.  OT Short Term Goal 5 - Progress (Week 1): Met Week 2:  OT Short Term Goal 1 (Week 2): STGs = LTGs   Skilled Therapeutic Interventions/Progress Updates:    Pt seen for BADL retraining of bathing at sink level and dressing with a focus on standing balance and awareness of feet placement for increased safety. Pt was able to transfer out of bed to w/c with distant supervision and no verbal cuing needed. He showered yesterday and only wanted to freshen up today. He used restroom from a standing position. Pt donned his clothing with S needing cues 2-3x to step his L foot out to increase his base of support. Pt was then taken to tub room to practice tub transfers with S from w/c >< toilet. Discussed that his bathroom doors at home may not be wide enough to enter with w/c, so he will need to practice with a RW tomorrow. Pt entered the kitchen and we discussed and reviewed his cooking procedures at home. He has his son get out all of the supplies for him to cook in his crockpot or pressure cooker and he sits in his power w/c to prepare meal, standing occasionally. So pt worked on  standing at [employer] to work with his hands. He also worked on 3 home exercises for 2 sets of 10 reps: heel raises, mini squats, and stepping left foot out and in.  Pt taken to his next therapy appointment.  Therapy Documentation Precautions:  Precautions Precautions: Fall Precaution Comments: L hemi Restrictions Weight Bearing Restrictions: No    Vital Signs: Therapy Vitals Temp: 98.2 F (36.8 C) Temp Source: Oral Pulse Rate: 76 Resp: 18 BP: 137/80 mmHg Patient Position (if appropriate): Lying Oxygen Therapy SpO2: 100 % O2 Device: Not Delivered Pain: Pain Assessment Pain Assessment: No/denies pain ADL: ADL ADL Comments: Refer to FIM  See FIM for current functional status  Therapy/Group: Individual Therapy  Doral Ventrella 03/17/2014, 9:14 AM

## 2014-03-17 NOTE — Progress Notes (Signed)
Physical Therapy Note  Patient Details  Name: Brian [name] MRN: [id number] Date of Birth: 1945/07/10 Today's Date: 03/17/2014   PT tx today without use of LAFO.  Pt does not benefit from [facility] and it will no longer be pursued.     Vickie Melnik 03/17/2014, 4:00 PM

## 2014-03-18 ENCOUNTER — Inpatient Hospital Stay (HOSPITAL_COMMUNITY): Payer: Medicare Other | Admitting: Speech Pathology

## 2014-03-18 ENCOUNTER — Inpatient Hospital Stay (HOSPITAL_COMMUNITY): Payer: Medicare Other | Admitting: Occupational Therapy

## 2014-03-18 ENCOUNTER — Inpatient Hospital Stay (HOSPITAL_COMMUNITY): Payer: Medicare Other | Admitting: *Deleted

## 2014-03-18 NOTE — Plan of Care (Signed)
Problem: RH Eating Goal: LTG Patient will perform eating w/assist, cues/equip (OT) LTG: Patient will perform eating with assist, with/without cues using equipment (OT)  Outcome: Adequate for Discharge Pt continues to need supervision due to swallowing issues.  Comments:  Pt continues to need supervision due to swallowing issues.

## 2014-03-18 NOTE — Progress Notes (Signed)
Occupational Therapy Discharge Summary  Patient Details  Name: Brian [name] MRN: [id number] Date of Birth: 01/02/1946    Patient has met 13 of 14 long term goals due to improved activity tolerance, improved balance, postural control, ability to compensate for deficits, functional use of  LEFT upper and LEFT lower extremity, improved attention, improved awareness and improved coordination.  Patient to discharge at overall Supervision level.  Patient's son and caregivers are not available for education. His daughter will attend late afternoon PT Education, but she is not his caregiver.  Handouts of OT needs, precautions, transfer recommendations given to pt to give to his family.  Education sheet attached below.   Reasons goals not met: Pt continues to need S with eating due to swallowing issues.  Recommendation:  Patient will benefit from ongoing skilled OT services in home health setting to continue to advance functional skills in the area of BADL.  Equipment: tub bench  Reasons for discharge: treatment goals met  Patient/family agrees with progress made and goals achieved: Yes  OT Discharge ADL ADL ADL Comments: close S overall Vision/Perception  Vision- Assessment Eye Alignment: Within Functional Limits Ocular Range of Motion: Within Functional Limits Alignment/Gaze Preference: Within Defined Limits Tracking/Visual Pursuits: Able to track stimulus in all quads without difficulty Visual Fields: No apparent deficits  Cognition  impaired attention and memory Sensation Sensation Light Touch Impaired Details: Impaired LUE;Impaired LLE Stereognosis: Appears Intact Hot/Cold: Appears Intact Proprioception: Appears Intact Coordination Gross Motor Movements are Fluid and Coordinated: No Fine Motor Movements are Fluid and Coordinated: No Motor  Motor Motor: Hemiplegia;Abnormal postural alignment and control;Ataxia;Abnormal tone Motor - Discharge Observations: increased flexor  tone in L hand with active movement Mobility    close S for stand pivot transfers with RW or squat pivot transfers Trunk/Postural Assessment  Postural Control Postural Control:  (trunk curvature to the L with posterior pelvic tilt)  Balance Static Sitting Balance Static Sitting - Level of Assistance: 7: Independent Dynamic Sitting Balance Dynamic Sitting - Level of Assistance: 5: Stand by assistance Static Standing Balance Static Standing - Level of Assistance: 5: Stand by assistance Dynamic Standing Balance Dynamic Standing - Balance Support: Left upper extremity supported;Right upper extremity supported (either hand providing support) Dynamic Standing - Level of Assistance: 5: Stand by assistance Extremity/Trunk Assessment RUE Assessment RUE Assessment: Within Functional Limits LUE Assessment LUE Assessment: Exceptions to WFL LUE AROM (degrees) Left Shoulder Flexion: 110 Degrees Left Shoulder ABduction: 100 Degrees Left Wrist Extension: 40 Degrees Left Composite Finger Extension: 75% Left Composite Finger Flexion:  (90%) LUE Strength Left Shoulder Flexion: 3-/5 Left Shoulder ABduction: 3-/5 Left Elbow Flexion: 3+/5 Left Elbow Extension: 3+/5 Left Hand Gross Grasp: Functional (Pt can now use L hand to use washcloth, hold object; he uses L hand actively during his self care)  See FIM for current functional status   OT RECOMMENDATIONS FOR Brian [name]: BATHING:Tub bench (inside legs are to be set shorter than outside legs). Brian [name] will walk into bathroom with walker with assist as he leans to the left. Stay on his left side. He can then sit down on bench without help and transfer into tub on bench.  He needs someone to stay close to him at all times to prevent falls. He can bathe himself and stand for short periods of time. Please stay very close to him. He will need extra non slip tub strips on the floor and a hand held shower. DRESSING/ TOILETING:Brian [name] is quite independent, but again  needs to have someone   right next to him to ensure he does not lose his balance.  Brian [name] needs to make sure that his feet are separated BEFORE he stands to give him the best base of support.   Assuming his w/c does not fit through the bathroom door, Brian [name] needs to walk in with the RW with assist. He can sit down and stand up to the toilet without physical help, but does need a helper by his side. COOKING:  Brian [name] can continue to prepare his meals in crockpot or slow cooker and stand to counter. He will need help getting out all of his supplies and again will need constant supervision.  Daily standing exercises:  (Standing at the kitchen counter) 1. Heel raises 2. Mini squats 3. Stepping left out and in  Daily hand exercises: 1. Opening and closing L hand 2. Reaching L arm forward and as high as possible without forcing his arm. 3. Stretching fingers open with R hand.  SAGUIER,JULIA 03/18/2014, 12:20 PM  

## 2014-03-18 NOTE — Discharge Summary (Signed)
NAME:  Brian [name], Brian [name] NO.:  [id number]  MEDICAL RECORD NO.:  [id number]  LOCATION:  [id number]                        FACILITY:  [facility]  PHYSICIAN:  [name], M.D.DATE OF BIRTH:  1945/04/19  DATE OF ADMISSION:  03/03/2014 DATE OF DISCHARGE:                              DISCHARGE SUMMARY   DISCHARGE DIAGNOSES: 1. Functional deficits secondary to right internal carotid artery     aneurysm rupture, status post coiling with subarachnoid hemorrhage. 2. Sequential compression devices for deep vein thrombosis     prophylaxis. 3. History of cerebrovascular accident x2 with hypercoagulable state.     Coumadin on hold due to subarachnoid hemorrhage. 4. Hypertension. 5. Dysphagia. 6. Methicillin-resistant Staphylococcus aureus.  PCR screening     positive. 7. Insomnia, improved. 8. Spasticity, left lower extremity.  HISTORY OF PRESENT ILLNESS:  This is a 70 year old right-handed male with history of tobacco abuse, CVA, hypertension, hypercoagulable state maintained on chronic Coumadin.  As reported, the patient recently ran out of his medications for the past week.  He lives with a son, used a walker prior to admission, had a home health aide.  Presented to outside hospital February 19, 2014, with headache and neck pain.  Blood pressure 161/113.  INR upon admission 1.48.  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  He is [facility] to [facility].  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  He remained on Nimotop monitoring for any vasospasms.  He was admitted for a comprehensive rehab program.  PAST MEDICAL HISTORY:  See discharge diagnoses.  SOCIAL HISTORY:  Lives with son.  FUNCTIONAL HISTORY:  Prior to admission, independent.  FUNCTIONAL STATUS:  Upon  admission to rehab services was +2 physical assist sit to stand, +2 physical assist stand pivot transfers, mod to max assist activities of daily living.  PHYSICAL EXAMINATION:  VITAL SIGNS:  Blood pressure 130/81, pulse 71, temperature 98, respirations 16. GENERAL:  This was an alert male, poor dentition, right gaze preference. LUNGS:  Clear to auscultation. CARDIAC:  Regular rate and rhythm. ABDOMEN:  Soft, nontender.  Good bowel sounds.  Fair insight and awareness of deficits.  REHABILITATION HOSPITAL COURSE:  The patient was admitted to inpatient rehab services with therapies initiated on a 3-hour daily basis consisting of physical therapy, occupational therapy, speech therapy, and rehabilitation nursing.  The following issues were addressed during the patient's rehabilitation stay.  Pertaining to Brian [name] right ICA aneurysm rupture with coiling.  Subarachnoid hemorrhage remained stable.  He would follow up Neurosurgery.  Sequential compression devices for DVT prophylaxis.  No signs of DVT.  Chronic Coumadin for history of hypercoagulable state held due to subarachnoid hemorrhage. Blood pressure is well controlled.  He was weaned off Nimotop.  His diet was slowly advanced to a mechanical soft.  He remained on contact precautions for MRSA, PCR screening positive.  He did have some spasticity to the left lower extremity, placed on Zanaflex.  The patient received weekly collaborative interdisciplinary team conferences to discuss estimated length of stay, family teaching, any  barriers to his discharge.  Gait training 150 feet minimal assist with cues using an assistive device.  He was fitted with an AFO brace.  Demonstrated good participation with therapies, activities of daily living with retraining of bathing at sink side, dressing with the focus on standing, balance, and awareness of feet placement.  He was able to transfer out of bed to wheelchair with distance supervision.   No verbal cuing.  He could use the restroom from a standing position.  He donned his clothing with supervision.  Full family teaching was completed with excellent overall progress.  Plan was discharged to home with ongoing therapies dictated per rehab services.  DISCHARGE MEDICATIONS: 1. Protonix 40 mg p.o. daily. 2. Requip 0.25 mg p.o. at bedtime. 3. Zanaflex 2 mg p.o. T.i.d.  Special instructions: No Coumadin at this time due to subarachnoid hemorrhage  DIET:  Mechanical soft.  The patient would follow up Dr. Alysia Penna, April 18, 2013; Dr. Kathyrn Sheriff, Neurosurgery call for appointment; Dr. Dione Housekeeper March 24, 2014.     Lauraine Rinne, P.A.   ______________________________ [name], M.D.    DA/MEDQ  D:  03/18/2014  T:  03/18/2014  Job:  [id number]  cc:   Margarita Rana, M.D. [name], M.D.

## 2014-03-18 NOTE — Progress Notes (Signed)
Speech Language Pathology Discharge Summary  Patient Details  Name: Brian [name] MRN: [id number] Date of Birth: [birth year]  Today's Date: 03/18/2014 SLP Individual Time: 1300-1400 SLP Individual Time Calculation (min): 60 min   Skilled Therapeutic Interventions:  Pt was seen for skilled speech therapy targeting goals for dysphagia and cognition.  Upon arrival, pt was reclined in bed asleep but easily awakened to voice and agreeable to participate in therapy.  Pt transferred to wheelchair to maximize attention and alertness during structured therapeutic tasks.  When propelling himself in the wheelchair, pt required mod assist verbal cues for awareness of left upper extremity.  No further cuing needed throughout the session for SLP completed skilled observations with presentations of pt's currently prescribed diet.  Pt utilized his recommended swallowing precautions to minimize residual dys 3 solids in the oral cavity post swallow with mod I.  Although pt was noted with prolonged mastication of solids which, when compounded with left sided weakness,large bite size, and fast rate, resulted in pocketing of dys 3 solids, pt self managed pocketing with a lingual sweep and liquid wash with no overt s/s of aspiration and no residuals left in the oral cavity.  Recommend upgrading pt to intermittent supervision during meals. RN and nurse tech aware.  During functional conversations with the pt during his meal, pt required overall min assist verbal cues for emergent awareness of how his current physical and cognitive limitations will affect his functional independence at home.  SLP strongly recommended that pt have long term assistance for medication and financial management.      Patient has met 7 of 7 long term goals.  Patient to discharge at [facility] level.  Reasons goals not met: n/a   Clinical Impression/Discharge Summary:  Pt made functional gains while inpatient and is discharging having met 7 out  of 7 short term goals.  Pt currently requires overall min assist for basic, functional cognitive tasks and is tolerating his currnetly prescribed diet with supervision cues for use of swallowing precautions to minimize overt s/s of aspiration with dys 3 solids and thin liquids (i.e. Slow rate, small bites/sips, check for pocketing) due to impulsivity.  Pt is discharging home with 24/7 supervision from family.  Recommend brief SLP follow up at next level of care to maximize functional independence, reduce burden of care in the home environment, and to complete family education regarding compensatory strategies for cognition and swallow safety as no family has been present for training while inpatient. Suspect that pt is not significantly altered from his baseline cognitively due to previous history of CVAs.  Therefore, recommend that pt have long term assistance for medication and financial management.    Care Partner:  Caregiver Able to Provide Assistance: Yes  Type of Caregiver Assistance: Physical;Cognitive  Recommendation:  Home Health SLP;24 hour supervision/assistance  Rationale for SLP Follow Up: Maximize cognitive function and independence;Reduce caregiver burden;Maximize swallowing safety   Equipment: none recommended by SLP    Reasons for discharge: Discharged from hospital   Patient/Family Agrees with Progress Made and Goals Achieved: Yes   See FIM for current functional status  Emilio Math 03/18/2014, 3:42 PM

## 2014-03-18 NOTE — Progress Notes (Signed)
Physical Therapy Discharge Summary Patient Details  Name: Brian [name] MRN: [id number] Date of Birth: 11/02/1945  Today's Date: 03/18/2014 PT Individual Time: 1010-1110 PT Individual Time Calculation (min): 60 min    Patient has met 10 of 10 long term goals due to improved activity tolerance, improved balance, decreased pain and improved attention.  Patient to discharge at an ambulatory level Supervision, household distances.   Patient's care partner is able to provide the necessary physical assistance at discharge, however actual caregiver will be unable to attend family training. Pt's daughter has attended family training and will communicate to his primary caregiver.  Reasons goals not met:  Pt has made considerable progress, but functional mobility and gait continues to be limited by dynamic balance deficits due to strong scissoring, narrow base of support, and hip instability L > R with gross Trendelenberg during gait.  Recommendation:  Patient will benefit from ongoing skilled PT services in home health setting to continue to advance safe functional mobility, address ongoing impairments in balance, gait, and minimize fall risk.   Equipment: Previous items, brought from home: L AFO, LSO binder Pt reports that he has the following items at home: RW and scooter which are appropriate for home.  Home Health, please continue to assess ongoing level of appropriateness for L AFO and LSO use.  Brooke Pace from [employer] suggested LSO with L hip abduction control, can be reached at [phone].  Reasons for discharge: treatment goals met  Patient/family agrees with progress made and goals achieved: Yes  Today's treatment focused on reviewing safety and techniques with all functional mobility tasks. Pt was received waiting in therapy gym after self-propelling from tub room to gym after OT.  Therapeutic activity x 30 min: - Car transfers with RW with S - Floor transfers with S,  training on quadruped position and safe transitions - Stairs x 12 with bil rails and VCs for sequencing, technique and safety  Neuromuscular reeducation in multiple functional positions with VCs for sequencing, technique and safety.  - 1 cue for L attention, faded VCs for step width during gait   Gait training x150' with LAFO, LSO, RW with Supervision, 1 cue for increased step width and upright posture.  - Training today on curb with RW, pt performed with S and 1 cue for correct positioning of RW.  Pt tolerated treatment well and demonstrated improved activity tolerance today and no c/o pain, which he reports is due to the positive effect his LSO has on reducing LBP.  Pt was returned to bed for a rest after PT. Pt demonstrated ability to remove his own shoes and AFO in long-sit with BUEs unsupported. Call bell and needs within reach.  Plan for D/C and family education tomorrow.   PT Discharge Precautions/Restrictions: Fall   Vital Signs Therapy Vitals Temp: 98 F (36.7 C) Temp Source: Oral Pulse Rate: 95 Resp: 16 BP: 115/83 mmHg Patient Position (if appropriate): Sitting Oxygen Therapy SpO2: 100 % O2 Device: Not Delivered Pain Pain Assessment Pain Assessment: No/denies pain Vision/Perception  Vision - Assessment Eye Alignment: Within Functional Limits Ocular Range of Motion: Within Functional Limits Alignment/Gaze Preference: Within Defined Limits Tracking/Visual Pursuits: Able to track stimulus in all quads without difficulty Saccades: Within functional limits Convergence: Within functional limits Additional Comments: Pt reports that he feels he has returned to baseline in terms of vision. Praxis Praxis-Other Comments: Pt no longer demonstrates impaired praxis with motor tasks.  Cognition Overall Cognitive Status: Impaired/Different from baseline Arousal/Alertness: Awake/alert Orientation  Level: Oriented X4 Attention: Selective Sustained Attention: Impaired Memory:  Impaired Problem Solving: Impaired Safety/Judgment: Impaired Sensation Sensation Light Touch: Impaired Detail Light Touch Impaired Details: Impaired RLE;Impaired LLE Stereognosis: Appears Intact Hot/Cold: Appears Intact Proprioception: Appears Intact Coordination Gross Motor Movements are Fluid and Coordinated: No Fine Motor Movements are Fluid and Coordinated: No Coordination and Movement Description: scissoring in LLE with ambulation has improved, progressed to min VCs only for reinforcement with fatigue  Finger Nose Finger Test: delayed in LUE with overshooting Heel Shin Test: Decreased accuracy and excursion with L on R Motor  Motor Motor: Hemiplegia;Abnormal postural alignment and control;Ataxia;Abnormal tone Motor - Skilled Clinical Observations: Acute on chronic L hemi, bil hip adduction/scissoring in standing/gait; trunk curvature to the left with posterior pelvic tilt Motor - Discharge Observations: PT demonstrates improvement in postural control with righting reactions and self-correction, but continues to demonstrate gait deficits.  Mobility Bed Mobility Bed Mobility: Sit to Supine Supine to Sit: 6: Modified independent (Device/Increase time) Sit to Supine: 6: Modified independent (Device/Increase time) Sit to Supine - Details (indicate cue type and reason): S cues for safety only Transfers Transfers: Yes Stand Pivot Transfers: 5: Supervision Squat Pivot Transfers: 5: Supervision Squat Pivot Transfer Details (indicate cue type and reason): Pt needs A for set-up and min cues for LE placement and controlled descent. Locomotion  Ambulation Ambulation: Yes Ambulation/Gait Assistance: 5: Supervision Ambulation Distance (Feet): 150 Feet Assistive device: Rolling walker Ambulation/Gait Assistance Details: Min A of 1, 1 cue for posture and step width Gait Gait: Yes Gait Pattern: Impaired Gait Pattern: Scissoring;Trendelenburg;Poor foot clearance - left Pelvis - Stance  Phase - Impaired Gait Pattern: Lateral hip instability - Left Hip - Swing Phase- Impaired Gait Pattern: Increased internal rotation - Left;Increased ADduction - Right;Increased ADduction - Left Gait velocity:  ( : 46 seconds) Stairs / Additional Locomotion Stairs: Yes Stairs Assistance: 4: Min assist Stairs Assistance Details: Verbal cues for precautions/safety;Verbal cues for technique;Verbal cues for safe use of DME/AE Stairs Assistance Details (indicate cue type and reason): Step-to pattern with Min A for VCs for technique, R rail only Stair Management Technique: Two rails Number of Stairs: 12 Height of Stairs: 6 Ramp: 4: Min assist Curb: 4: Min Paediatric nurse: Yes Wheelchair Assistance: 5: Financial planner Details:  (Supervision for set-up and management of parts) Wheelchair Propulsion: Both upper extremities;Other (comment) (R > L) Wheelchair Parts Management: Needs assistance Distance: 50  Trunk/Postural Assessment  Cervical Assessment Cervical Assessment: Within Functional Limits Thoracic Assessment Thoracic Assessment: Exceptions to [facility] (mild kyphosis in sitting, forward trunk in standing posture) Lumbar Assessment Lumbar Assessment: Exceptions to [facility] (chronic LBP limits activity tolerance; mild L trunk curve; posterior pelvic tilt) Postural Control Postural Control: Deficits on evaluation  Balance Static Sitting Balance Static Sitting - Balance Support: No upper extremity supported;Feet unsupported Static Sitting - Level of Assistance: 7: Independent Dynamic Sitting Balance Dynamic Sitting - Balance Support: During functional activity;No upper extremity supported Dynamic Sitting - Level of Assistance: 5: Stand by assistance Reach (Patient is able to reach ___ inches to right, left, forward, back): Pt is able to reach 10" outside BOS to R, L, forward Dynamic Sitting - Balance Activities: Reaching across midline;Reaching  for objects Sitting balance - Comments:  (Pt can perform dynamic functional tasks in sitting without UE support) Static Standing Balance Static Standing - Balance Support: During functional activity;Bilateral upper extremity supported Static Standing - Level of Assistance: 5: Stand by assistance Dynamic Standing Balance Dynamic Standing - Balance Support: Bilateral upper  extremity supported Dynamic Standing - Level of Assistance: 5: Stand by assistance Dynamic Standing - Balance Activities: Forward lean/weight shifting Extremity Assessment  RUE Assessment RUE Assessment: Within Functional Limits   RLE Assessment RLE Assessment: Within Functional Limits LLE Assessment LLE Assessment: Exceptions to [facility] LLE Strength LLE Overall Strength Comments: 2+/5 throughout except 1/5 ankle DF  See FIM for current functional status  Blenda Mounts 03/18/2014, 4:23 PM

## 2014-03-18 NOTE — Progress Notes (Signed)
Occupational Therapy Session Note  Patient Details  Name: Brian [name] MRN: [id number] Date of Birth: Apr 06, 1945  Today's Date: 03/18/2014 OT Individual Time: 0905-1005 OT Individual Time Calculation (min): 60 min    Short Term Goals: Week 1:  OT Short Term Goal 1 (Week 1): Pt will transfer to and from toilet to w/c with supervision. OT Short Term Goal 1 - Progress (Week 1): Met OT Short Term Goal 2 (Week 1): Pt will toilet with min A. OT Short Term Goal 2 - Progress (Week 1): Met OT Short Term Goal 3 (Week 1): Pt will be set up with UB dressing. OT Short Term Goal 3 - Progress (Week 1): Met OT Short Term Goal 4 (Week 1): Pt will stand with steadying A to don pants over hips. OT Short Term Goal 4 - Progress (Week 1): Met OT Short Term Goal 5 (Week 1): Pt will be able to don pants over feet and socks with extra time.  OT Short Term Goal 5 - Progress (Week 1): Met Week 2:  OT Short Term Goal 1 (Week 2): STGs = LTGs   Skilled Therapeutic Interventions/Progress Updates:    Pt seen for BADL retraining in tub room to allow pt to work on transfers with RW to tub bench in tub.  Pt was in bed and asked to transfer himself to his w/c. Pt needed to be cued to lock his breaks. Pt used toilet from standing in his room.  Pt taken to tub room and he ambulated from outside the bathroom door to the tub with min A and then completed his stand pivot with RW to tub and completed tub transfer with S. Supervision with shower even in standing as pt had good awareness of his L foot placement. Pt used RW to transfer from tub bench to sit on a chair with S to complete all of his dressing with S.  He then practiced another RW transfer to toilet with S. Min A to ambulate out of bathroom to his w/c. Pt taken to gym to review his progress and for a reassessment of his skills. Pt then worked on [employer] >< mat transfers with RW 4x all with S as pt is now attending to his LLE. Pt resting in w/c for his next therapy session in  the gym.  Therapy Documentation Precautions:  Precautions Precautions: Fall Precaution Comments: L hemi Restrictions Weight Bearing Restrictions: No Pain: Pain Assessment Pain Assessment: No/denies pain ADL: ADL ADL Comments: Refer to FIM     See FIM for current functional status  Therapy/Group: Individual Therapy  Fianna Snowball 03/18/2014, 11:59 AM

## 2014-03-18 NOTE — Progress Notes (Signed)
Nursing Note: Pt refused SCDs despite education on purpose and importance in preventing DVTs.Pt has restless leg syndrome and refuses to wear them.wbb

## 2014-03-18 NOTE — Progress Notes (Signed)
Patient ID: Brian [name], male   DOB: 31-Jan-1946, 69 y.o.   MRN: [id number]  Subjective/Complaints: Slept well again. Review of Systems - Negative except poor appetite  Objective: Vital Signs: Blood pressure 151/80, pulse 76, temperature 98.2 F (36.8 C), temperature source Oral, resp. rate 18, height 5\' 7"  (1.702 m), weight 65.137 kg (143 lb 9.6 oz), SpO2 100 %. No results found. No results found for this or any previous visit (from the past 72 hour(s)).   HEENT: normal Cardio: RRR and no murmur Resp: CTA B/L and Unlabored GI: BS positive and nontender nondistended Extremity:  Pulses positive and No Edema Skin:   Intact Assessment/Plan: 1. 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 Physiatrist is providing close team supervision and 24 hour management of active medical problems listed below. Physiatrist and rehab team continue to assess barriers to discharge/monitor patient progress toward functional and medical goals.   Plan discharge in morning .FIM: FIM - Bathing Bathing Steps Patient Completed: Chest, Right Arm, Left Arm, Abdomen, Front perineal area, Buttocks, Left lower leg (including foot), Right lower leg (including foot), Left upper leg, Right upper leg Bathing: 5: Supervision: Safety issues/verbal cues  FIM - Upper Body Dressing/Undressing Upper body dressing/undressing steps patient completed: Thread/unthread right sleeve of pullover shirt/dresss, Thread/unthread left sleeve of pullover shirt/dress, Put head through opening of pull over shirt/dress, Pull shirt over trunk Upper body dressing/undressing: 5: Set-up assist to: Obtain clothing/put away FIM - Lower Body Dressing/Undressing Lower body dressing/undressing steps patient completed: Thread/unthread right pants leg, Thread/unthread left pants leg, Pull pants up/down, Fasten/unfasten pants,  Thread/unthread right underwear leg, Thread/unthread left underwear leg, Pull underwear up/down, Don/Doff left shoe, Fasten/unfasten right shoe, Fasten/unfasten left shoe, Don/Doff right shoe Lower body dressing/undressing: 4: Min-Patient completed 75 plus % of tasks  FIM - Toileting Toileting steps completed by patient: Adjust clothing prior to toileting, Adjust clothing after toileting, Performs perineal hygiene Toileting Assistive Devices: Grab bar or rail for support Toileting: 4: Steadying assist  FIM - Radio producer Devices: Product manager Transfers: 5-To toilet/BSC: Supervision (verbal cues/safety issues), 5-From toilet/BSC: Supervision (verbal cues/safety issues)  FIM - Control and instrumentation engineer Devices: Arm rests Bed/Chair Transfer: 5: Set-up assist to: Apply orthosis/W/C set-up  FIM - Locomotion: Wheelchair Distance: 25 Locomotion: Wheelchair: 1: Travels less than 50 ft with supervision, cueing or coaxing FIM - Locomotion: Ambulation Locomotion: Ambulation Assistive Devices: Other (comment) (Rollator) Ambulation/Gait Assistance: 4: Min guard Locomotion: Ambulation: 5: Travels 150 ft or more with supervision/safety issues  Comprehension Comprehension Mode: Auditory Comprehension: 5-Follows basic conversation/direction: With extra time/assistive device  Expression Expression Mode: Verbal Expression: 5-Expresses basic 90% of the time/requires cueing < 10% of the time.  Social Interaction Social Interaction: 4-Interacts appropriately 75 - 89% of the time - Needs redirection for appropriate language or to initiate interaction.  Problem Solving Problem Solving: 3-Solves basic 50 - 74% of the time/requires cueing 25 - 49% of the time  Memory Memory: 4-Recognizes or recalls 75 - 89% of the time/requires cueing 10 - 24% of the time  Medical Problem List and Plan: 1. 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 DVT Prophylaxis/Anticoagulation: SCDs. Monitor for any signs of DVT 3. Pain Management: Tylenol as needed 4. History of CVA 2 with hypercoagulable state. Coumadin on hold due to subarachnoid hemorrhage. 5. Neuropsych: This patient is  capable of making decisions on his own behalf. 6. Skin/Wound Care: Routine skin checks 7. Fluids/Electrolytes/Nutrition: encourage PO 8. Hypertension. Nimotop protocol to end 03/13/2014. Monitor with increased mobility 9. Dysphagia. Dysphagia 2 thin liquid diet. Monitor for any aspiration. Follow-up speech therapy 10. MRSA PCR screening positive. Contact precautions 11. Thrush: nystatin swish and swallow 12.  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  Insomnia with probable restless leg--requip--sleeping better with this---no reports of morning sedation with medication  -continue current dose 14. Spasticity left lower extremity, may benefit from botox although this woun not address hip abd weakness LOS (Days) 15 A FACE TO FACE EVALUATION WAS PERFORMED  KIRSTEINS,ANDREW E 03/18/2014, 8:58 AM

## 2014-03-18 NOTE — Discharge Summary (Signed)
Discharge summary job [phone]

## 2014-03-19 ENCOUNTER — Inpatient Hospital Stay (HOSPITAL_COMMUNITY): Payer: Medicare Other | Attending: Physical Medicine & Rehabilitation

## 2014-03-19 MED ORDER — TRAZODONE HCL 50 MG PO TABS: 50.0000 mg | ORAL_TABLET | Freq: Every evening | ORAL | Status: DC | PRN

## 2014-03-19 MED ORDER — TRAMADOL HCL 50 MG PO TABS
50.0000 mg | ORAL_TABLET | Freq: Once | ORAL | Status: AC
  Administered 2014-03-19: 50 mg via ORAL
  Filled 2014-03-19: qty 1

## 2014-03-19 MED ORDER — ROPINIROLE HCL 0.25 MG PO TABS: 0.2500 mg | ORAL_TABLET | Freq: Every day | ORAL | Status: DC

## 2014-03-19 MED ORDER — TIZANIDINE HCL 2 MG PO TABS: 2.0000 mg | ORAL_TABLET | Freq: Three times a day (TID) | ORAL | Status: DC | PRN

## 2014-03-19 MED ORDER — PANTOPRAZOLE SODIUM 40 MG PO PACK: 40.0000 mg | PACK | Freq: Every day | ORAL | Status: DC

## 2014-03-19 NOTE — Progress Notes (Signed)
Social Work Discharge Note Discharge Note  The overall goal for the admission was met for:   Discharge location: Yes-HOME WITH SON WHO CAN PROVIDE 24 HR SUPERVISION  Length of Stay: Yes-16 DAYS  Discharge activity level: Yes-SUPERVISION WITH CUEING  Home/community participation: Yes  Services provided included: MD, RD, PT, OT, SLP, RN, CM, Pharmacy and SW  Financial Services: Medicare and Medicaid  Follow-up services arranged: [facility]-PT, OT, RN, SP-PT HAS USED THEM BEFORE AND PREFERS.  [facility]-ROLLATOR ROLLING WALKER & TUB BENCH   Comments (or additional information):FAMILY EDUCATION TODAY AT 3;00PM ONLY DAUGHTER WHO WILL SHOW BROTHER, WHO COULD NOT COME IN DUE TO DISTANCE. RESUME PCS SERVICES  Patient/Family verbalized understanding of follow-up arrangements: Yes  Individual responsible for coordination of the follow-up plan: SELF & [name]  Confirmed correct DME delivered: Brian [name] 03/19/2014    Brian [name]

## 2014-03-19 NOTE — Progress Notes (Signed)
Pt c/o rt leg burning pain 10/10 PA in call notified new orders received. Arthor Captain LPN

## 2014-03-19 NOTE — Progress Notes (Signed)
Patient discharged home.  Left floor via wheelchair, escorted by nursing staff and family.  All patient belongings sent with patient including DME.  Patient and family verbalized understanding of discharge instructions, as given by Marlowe Shores, PA.  Appears to be in no immediate distress at this time.  Brita Romp, RN

## 2014-03-19 NOTE — Progress Notes (Signed)
Patient ID: Brian [name], male   DOB: [birth year], 69 y.o.   MRN: [id number]  Subjective/Complaints:  Review of Systems - Negative except poor appetite  Objective: Vital Signs: Blood pressure 107/70, pulse 70, temperature 98.6 F (37 C), temperature source Oral, resp. rate 17, height 5\' 7"  (1.702 m), weight 65.137 kg (143 lb 9.6 oz), SpO2 100 %. No results found. No results found for this or any previous visit (from the past 72 hour(s)).   HEENT: normal Cardio: RRR and no murmur Resp: CTA B/L and Unlabored GI: BS positive and nontender nondistended Extremity:  Pulses positive and No Edema Skin:   Intact Assessment/Plan: 1. Functional deficits secondary to Right SAH from aneurysm rupture  Stable for D/C today F/u PCP in 1-2 weeks F/u PM&R 3 weeks See D/C summary See D/C instructions .FIM: FIM - Bathing Bathing Steps Patient Completed: Chest, Right Arm, Left Arm, Abdomen, Front perineal area, Buttocks, Left lower leg (including foot), Right lower leg (including foot), Left upper leg, Right upper leg Bathing: 5: Supervision: Safety issues/verbal cues  FIM - Upper Body Dressing/Undressing Upper body dressing/undressing steps patient completed: Thread/unthread right sleeve of pullover shirt/dresss, Thread/unthread left sleeve of pullover shirt/dress, Put head through opening of pull over shirt/dress, Pull shirt over trunk Upper body dressing/undressing: 5: Set-up assist to: Obtain clothing/put away FIM - Lower Body Dressing/Undressing Lower body dressing/undressing steps patient completed: Thread/unthread right pants leg, Thread/unthread left pants leg, Pull pants up/down, Fasten/unfasten pants, Thread/unthread right underwear leg, Thread/unthread left underwear leg, Pull underwear up/down, Don/Doff left shoe, Fasten/unfasten right shoe, Fasten/unfasten left shoe, Don/Doff right shoe, Don/Doff left sock, Don/Doff right sock Lower body dressing/undressing: 5: Supervision: Safety  issues/verbal cues  FIM - Toileting Toileting steps completed by patient: Adjust clothing prior to toileting, Adjust clothing after toileting, Performs perineal hygiene Toileting Assistive Devices: Grab bar or rail for support Toileting: 5: Supervision: Safety issues/verbal cues  FIM - Radio producer Devices: Insurance account manager Transfers: 5-To toilet/BSC: Supervision (verbal cues/safety issues), 5-From toilet/BSC: Supervision (verbal cues/safety issues)  FIM - Control and instrumentation engineer Devices: Arm rests Bed/Chair Transfer: 6: Supine > Sit: No assist, 6: Sit > Supine: No assist, 5: Bed > Chair or W/C: Supervision (verbal cues/safety issues), 5: Chair or W/C > Bed: Supervision (verbal cues/safety issues)  FIM - Locomotion: Wheelchair Distance: 50 Locomotion: Wheelchair: 2: Travels 50 - 149 ft with supervision, cueing or coaxing FIM - Locomotion: Ambulation Locomotion: Ambulation Assistive Devices: Environmental consultant - Rolling, Other (comment) (>150'; LAFO, [place] LSO) Ambulation/Gait Assistance: 5: Supervision Locomotion: Ambulation: 5: Travels 150 ft or more with supervision/safety issues  Comprehension Comprehension Mode: Auditory Comprehension: 5-Follows basic conversation/direction: With no assist  Expression Expression Mode: Verbal Expression: 5-Expresses basic 90% of the time/requires cueing < 10% of the time.  Social Interaction Social Interaction: 5-Interacts appropriately 90% of the time - Needs monitoring or encouragement for participation or interaction.  Problem Solving Problem Solving: 4-Solves basic 75 - 89% of the time/requires cueing 10 - 24% of the time  Memory Memory: 4-Recognizes or recalls 75 - 89% of the time/requires cueing 10 - 24% of the time  Medical Problem List and Plan: 1. 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 DVT  Prophylaxis/Anticoagulation: SCDs. Monitor for any signs of DVT 3. Pain Management: Tylenol as needed 4. History of CVA 2 with hypercoagulable state. Coumadin on hold due to subarachnoid hemorrhage.Neurosurgery f/u to advise if/when anticoag is safe    LOS (  Days) [place] EVALUATION WAS PERFORMED  Manford Sprong E 03/19/2014, 9:00 AM

## 2014-03-19 NOTE — Discharge Instructions (Signed)
Inpatient Rehab Discharge Instructions  Brian [name] Discharge date and time: No discharge date for patient encounter.   Activities/Precautions/ Functional Status: Activity: activity as tolerated Diet: Soft Wound Care: none needed Functional status:  ___ No restrictions     ___ Walk up steps independently _x__ 24/7 supervision/assistance   ___ Walk up steps with assistance ___ Intermittent supervision/assistance  ___ Bathe/dress independently ___ Walk with walker     ___ Bathe/dress with assistance ___ Walk Independently    ___ Shower independently _x__ Walk with assistance    ___ Shower with assistance ___ No alcohol     ___ Return to work/school ________  Special Instructions: No drivingSTROKE/TIA DISCHARGE INSTRUCTIONS  No Coumadin at this time secondary to subarachnoid hemorrhage  COMMUNITY REFERRALS UPON DISCHARGE:    Home Health:   PT, OT, RN, [place] TDDUK:[phone] Date of last service:03/19/2014     Medical Equipment/Items Ordered:T[facility] [place]    [phone] OTHER: RESUME [facility] [place]  [phone]  GENERAL COMMUNITY RESOURCES FOR PATIENT/FAMILY: Support Groups:[facility] SUPPORT GROUP   SMOKING Cigarette smoking nearly doubles your risk of having a stroke & is the single most alterable risk factor  If you smoke or have smoked in the last 12 months, you are advised to quit smoking for your health.  Most of the excess cardiovascular risk related to smoking disappears within a year of stopping.  Ask you doctor about anti-smoking medications  [place] Quit Line: 1-800-QUIT NOW  Free Smoking Cessation Classes [phone]  CHOLESTEROL Know your levels; limit fat & cholesterol in your diet  Lipid Panel  No results found for: CHOL, TRIG, HDL, CHOLHDL, VLDL, LDLCALC    Many patients benefit from treatment even if their cholesterol is at goal.  Goal: Total Cholesterol (CHOL)  less than 160  Goal:  Triglycerides (TRIG) less than 150  Goal:  HDL greater than 40  Goal:  LDL (LDLCALC) less than 100   BLOOD PRESSURE American Stroke Association blood pressure target is less that 120/80 mm/Hg  Your discharge blood pressure is:  BP: 118/74 mmHg  Monitor your blood pressure  Limit your salt and alcohol intake  Many individuals will require more than one medication for high blood pressure  DIABETES (A1c is a blood sugar average for last 3 months) Goal HGBA1c is under 7% (HBGA1c is blood sugar average for last 3 months)  Diabetes: No known diagnosis of diabetes    No results found for: HGBA1C   Your HGBA1c can be lowered with medications, healthy diet, and exercise.  Check your blood sugar as directed by your physician  Call your physician if you experience unexplained or low blood sugars.  PHYSICAL ACTIVITY/REHABILITATION Goal is 30 minutes at least 4 days per week  Activity: Increase activity slowly, Therapies: Physical Therapy: Home Health Return to work:   Activity decreases your risk of heart attack and stroke and makes your heart stronger.  It helps control your weight and blood pressure; helps you relax and can improve your mood.  Participate in a regular exercise program.  Talk with your doctor about the best form of exercise for you (dancing, walking, swimming, cycling).  DIET/WEIGHT Goal is to maintain a healthy weight  Your discharge diet is: DIET DYS 3  liquids Your height is:  Height: 5\' 7"  (170.2 cm) Your current weight is: Weight: 65.137 kg (143 lb 9.6 oz) Your Body Mass Index (BMI) is:  BMI (  Calculated): 22.5  Following the type of diet specifically designed for you will help prevent another stroke.  Your goal weight range is:    Your goal Body Mass Index (BMI) is 19-24.  Healthy food habits can help reduce 3 risk factors for stroke:  High cholesterol, hypertension, and excess weight.  RESOURCES Stroke/Support Group:  Call  [phone]   STROKE EDUCATION PROVIDED/REVIEWED AND GIVEN TO PATIENT Stroke warning signs and symptoms How to activate emergency medical system (call 911). Medications prescribed at discharge. Need for follow-up after discharge. Personal risk factors for stroke. Pneumonia vaccine given:  Flu vaccine given:  My questions have been answered, the writing is legible, and I understand these instructions.  I will adhere to these goals & educational materials that have been provided to me after my discharge from the hospital.       My questions have been answered and I understand these instructions. I will adhere to these goals and the provided educational materials after my discharge from the hospital.  Patient/Caregiver Signature _______________________________ Date __________  Clinician Signature _______________________________________ Date __________  Please bring this form and your medication list with you to all your follow-up doctor's appointments.

## 2014-03-20 NOTE — Progress Notes (Signed)
Physical Therapy Session Note  Patient Details  Name: Brian [name] MRN: [id number] Date of Birth: Dec 28, 1945  Today's Date: 03/20/2014 PT Individual Time: 1505-1550 PT Individual Time Calculation (min): 45 min   Short Term Goals: Week 2:  PT Short Term Goal 1 (Week 2): STG=LTG due to LOS     Skilled Therapeutic Interventions/Progress Updates:  Pt 's daughter Denman George here for family ed.    She observed pt performing a basic transfer, modified independent.  She was instructed in, and safely return demonstrated how to provide close supervision for: -pt performing gait with RW, LAFO and LSO on carpet and tile - ascending/descending curb using RW, -car transfer  - how to don LSO  After guarding pt performing gait, she commented that pt has ambulated "this way" (with many marked gait deviations) since his first back surgery.  CSW ordered a 4WW for this pt, which is currently too mobile for him. Daughter observed how this 4WW works, and agreed that pt is not ready for it at this time.  The pt really wants to use this type of walker, so he and the daughter agreed that he would use his RW until HHPT trains and approves him to use the 4 [employer].     Therapy Documentation Precautions:  Precautions Precautions: Fall Precaution Comments: L hemi Restrictions Weight Bearing Restrictions: No    See FIM for current functional status  Therapy/Group: Individual Therapy  Kamyla Olejnik 03/20/2014, 6:29 AM

## 2014-04-10 DIAGNOSIS — I69044 Monoplegia of lower limb following nontraumatic subarachnoid hemorrhage affecting left non-dominant side: Secondary | ICD-10-CM

## 2014-04-10 DIAGNOSIS — R1311 Dysphagia, oral phase: Secondary | ICD-10-CM | POA: Diagnosis not present

## 2014-04-10 DIAGNOSIS — Z8673 Personal history of transient ischemic attack (TIA), and cerebral infarction without residual deficits: Secondary | ICD-10-CM

## 2014-04-10 DIAGNOSIS — Z87891 Personal history of nicotine dependence: Secondary | ICD-10-CM | POA: Diagnosis not present

## 2014-04-10 DIAGNOSIS — I10 Essential (primary) hypertension: Secondary | ICD-10-CM

## 2014-04-15 ENCOUNTER — Telehealth: Payer: Self-pay | Admitting: *Deleted

## 2014-04-15 NOTE — Telephone Encounter (Signed)
Anderson Malta from [place] called about Marie. Please fax it back asap

## 2014-04-18 ENCOUNTER — Encounter: Payer: Medicare Other | Attending: Physical Medicine & Rehabilitation

## 2014-04-18 ENCOUNTER — Inpatient Hospital Stay: Payer: Medicare Other | Admitting: Physical Medicine & Rehabilitation

## 2014-05-15 ENCOUNTER — Encounter: Payer: Self-pay | Admitting: Physical Medicine & Rehabilitation

## 2014-05-15 ENCOUNTER — Encounter: Payer: Medicare Other | Attending: Physical Medicine & Rehabilitation

## 2014-05-15 ENCOUNTER — Ambulatory Visit (HOSPITAL_BASED_OUTPATIENT_CLINIC_OR_DEPARTMENT_OTHER): Payer: Medicare Other | Admitting: Physical Medicine & Rehabilitation

## 2014-05-15 VITALS — BP 129/82 | HR 93 | Resp 16

## 2014-05-15 DIAGNOSIS — G8194 Hemiplegia, unspecified affecting left nondominant side: Secondary | ICD-10-CM

## 2014-05-15 DIAGNOSIS — G8190 Hemiplegia, unspecified affecting unspecified side: Secondary | ICD-10-CM | POA: Diagnosis present

## 2014-05-15 DIAGNOSIS — I608 Other nontraumatic subarachnoid hemorrhage: Secondary | ICD-10-CM | POA: Insufficient documentation

## 2014-05-15 MED ORDER — DIAZEPAM 5 MG PO TABS: 5.0000 mg | ORAL_TABLET | Freq: Once | ORAL | Status: DC

## 2014-05-15 MED ORDER — TIZANIDINE HCL 2 MG PO TABS: 2.0000 mg | ORAL_TABLET | Freq: Three times a day (TID) | ORAL | Status: DC | PRN

## 2014-05-15 NOTE — Progress Notes (Signed)
Subjective:    Patient ID: Brian [name], male    DOB: [birth year], 69 y.o.   MRN: [id number]  HPI   Pain Inventory Average Pain 8 Pain Right Now 9 My pain is intermittent, sharp and tingling  In the last 24 hours, has pain interfered with the following? General activity 9 Relation with others 9 Enjoyment of life 9 What TIME of day is your pain at its worst? daytime Sleep (in general) Poor  Pain is worse with: walking, standing and some activites Pain improves with: rest and medication Relief from Meds: 10 takes hydrocodone froom Dr Merlene Laughter  Mobility use a cane use a walker ability to climb steps?  yes  Function I need assistance with the following:  household duties  Neuro/Psych trouble walking depression anxiety  Prior Studies Any changes since last visit?  no  Physicians involved in your care Primary care [facility] pain [facility]   Family History  Problem Relation Age of Onset  . Colon cancer Neg Hx   . Liver disease Neg Hx    History   Social History  . Marital Status: Widowed    Spouse Name: N/A  . Number of Children: 6  . Years of Education: N/A   Occupational History  . retired     Biomedical scientist   Social History Main Topics  . Smoking status: Current Every Day Smoker -- 0.25 packs/day for 40 years    Types: Cigarettes  . Smokeless tobacco: Never Used     Comment: 2-3 cigarettes per day  . Alcohol Use: Yes  . Drug Use: Yes    Special: Marijuana     Comment: tried it a couple of months ago per pt.  . Sexual Activity: No   Other Topics Concern  . None   Social History Narrative   Has 20 brothers/sisters. Six living children. One deceased child.   Past Surgical History  Procedure Laterality Date  . Back surgery  2002/2009  . Laparotomy  12/18/2010    Procedure: EXPLORATORY LAPAROTOMY;  Surgeon: Donato Heinz;  Location: [facility];  Service: General;  Laterality: N/A;  . Partial colectomy  12/18/2010    Procedure: PARTIAL  COLECTOMY;  Surgeon: Donato Heinz;  Location: [facility];  Service: General;  Laterality: N/A;  . Radiology with anesthesia N/A 02/20/2014    Procedure: RADIOLOGY WITH ANESTHESIA;  Surgeon: [name], MD;  Location: [facility];  Service: Radiology;  Laterality: N/A;   Past Medical History  Diagnosis Date  . Hypertension   . CVA (cerebral infarction)   . 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 Renal disorder   . Sepsis(995.91)   . Vascular insufficiency   . Stroke    BP 129/82 mmHg  Pulse 93  Resp 16  SpO2 99%  Opioid Risk Score:   Fall Risk Score: Moderate Fall Risk (6-13 points) (educated and given handout)`1  Depression screen PHQ 2/9  Depression screen PHQ 2/9 05/15/2014  Decreased Interest 0  Down, Depressed, Hopeless 3  PHQ - 2 Score 3  Altered sleeping 3  Tired, decreased energy 3  Change in appetite 0  Feeling bad or failure about yourself  2  Trouble concentrating 0  Moving slowly or fidgety/restless 2  Suicidal thoughts 0  PHQ-9 Score 13    Review of Systems  Musculoskeletal: Positive for gait problem.       Spasms  Psychiatric/Behavioral: Positive for dysphoric mood. The patient is nervous/anxious.   All other systems reviewed and are  negative.      Objective:   Physical Exam        Assessment & Plan:  See note from [place]

## 2014-05-15 NOTE — Progress Notes (Signed)
Subjective:     Patient ID: Brian [name], male   DOB: 03-18-1945, 69 y.o.   MRN: [id number]  HPI Brian [name] is a 69 y/o M with a PMH of HTN s/p SAH due to ruptured aneurysm in 12/15. He has completed his therapy with PT and OT as of 05/14/14. He has a home health aide who comes to his house daily to help with ADLs. He needs assistance with dressing, transferring to and from tub, and meal preparation. He toilets without assistance. He uses a walker to ambulate most of the time, and a single point cane for short distances such as to and from bathroom. He can go up and down stairs but avoids this as he is at risk for falling. He lives with his son.  He endorses some symptoms of depression, including depressed mood, loss of energy, and lack of motivation. He states that he has felt this way for a long time, even before his SAH.   He still experiences pain in his hip and back, which he describes as stabbing. The hydrocodone helps some with his pain, but he states that it is not as effective as it used to be. The pain improves with rest.  Review of Systems Positive for depressed mood, loss of energy, lack of motivation. Positive for gait problem.  Objective:  Physical Exam  Constitutional: He is oriented to person, place, and time. He appears well-developed and well-nourished.  HENT:  Head: Normocephalic and atraumatic.  Eyes: Conjunctivae and EOM are normal. Pupils are equal, round, and reactive to light.  Neurological: He is alert and oriented to person, place, and time.  Motor strength is 3/5 in the left deltoid, biceps, triceps, grip 3+ in the hip flexor knee extensor 2 minus ankle dorsi flexor plantar flexor His left AFO Ambulation is with scissoring gait pattern Patient has increased tone in the left hip adductor  Psychiatric: He has a normal mood and affect.  Nursing note and vitals reviewed.    Assessment:  1. Post stroke care: Still needs assistance with ADLs. Has a home health  aide come to his house daily.  2. Depression: Moderate-severe, PHQ9 score today is 13.   3. Chronic low back pain: Followed by neurology  Plan:  1. Post stroke care: Continue to receive assistance for ADLs from home health aide.  Has left-sided spasticity particularly affecting gait pattern. Recommend obturator nerve block with phenol at next visit. Gave him one tablet of Valium 5 mg to take prior to the procedure since the patient admits to anxiety regarding injections  2. Depression: Continue to monitor. PHQ9 today is 13.  3. Chronic low back pain: Continue to follow up with neurology.   [employer] served as Education administrator for Dr. Letta Pate.

## 2014-05-15 NOTE — Progress Notes (Signed)
   Subjective:    Patient ID: Brian [name], male    DOB: 1945/10/23, 69 y.o.   MRN: [id number]  HPI 69 year old right-handed male with history of tobacco abuse, CVA, hypertension, hypercoagulable state maintained on chronic Coumadin.  As reported, the patient recently ran out of his medications for the past week.  He lives with a son, used a walker prior to admission, had a home health aide.  Presented to outside hospital February 19, 2014, with headache and neck pain. Blood pressure 161/113.  INR upon admission 1.48.  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  [facility].  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    Review of Systems     Objective:   Physical Exam        Assessment & Plan:

## 2014-06-17 ENCOUNTER — Encounter: Payer: Self-pay | Admitting: Physical Medicine & Rehabilitation

## 2014-06-17 ENCOUNTER — Ambulatory Visit (HOSPITAL_BASED_OUTPATIENT_CLINIC_OR_DEPARTMENT_OTHER): Payer: Medicare Other | Admitting: Physical Medicine & Rehabilitation

## 2014-06-17 ENCOUNTER — Encounter: Payer: Medicare Other | Attending: Physical Medicine & Rehabilitation

## 2014-06-17 VITALS — BP 128/82 | HR 108 | Resp 14

## 2014-06-17 DIAGNOSIS — G8114 Spastic hemiplegia affecting left nondominant side: Secondary | ICD-10-CM | POA: Diagnosis not present

## 2014-06-17 DIAGNOSIS — I608 Other nontraumatic subarachnoid hemorrhage: Secondary | ICD-10-CM | POA: Insufficient documentation

## 2014-06-17 DIAGNOSIS — G8190 Hemiplegia, unspecified affecting unspecified side: Secondary | ICD-10-CM | POA: Diagnosis not present

## 2014-06-17 NOTE — Patient Instructions (Addendum)
Obturator nerve block with phenol today. This medication may start taking the fact today however full effect will be at about one week Duration of the effect is 3-6 months Side effects of medication may include inner thigh numbness Call if you have burning pain so we can recommend any medication for that.  NO ALCOHOL

## 2014-06-17 NOTE — Progress Notes (Signed)
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  Informed consent was obtained after describing the risks and benefits of the procedure with the patient this includes bleeding bruising and infection as well as medication side effects. The patient elected to proceed and has given written consent. Patient placed in a supine position on the exam table. External DC stimulation was applied to the medial thigh using a nerve stimulator. Thigh adductor twitch was obtained. The upper medial thigh region was prepped with Betadine and then entered with a 22-gauge 40 mm needle electrode under electrical stimulation guidance. Thigh adductor twitch was obtained and confirmed. Then 4 cc of 5% phenol were injected. The patient tolerated procedure well. Post procedure instructions and followup visit were given.

## 2014-07-25 ENCOUNTER — Ambulatory Visit: Payer: Medicare Other | Admitting: Physical Medicine & Rehabilitation

## 2014-07-25 ENCOUNTER — Encounter: Payer: Medicare Other | Attending: Physical Medicine & Rehabilitation

## 2014-07-25 DIAGNOSIS — I608 Other nontraumatic subarachnoid hemorrhage: Secondary | ICD-10-CM | POA: Insufficient documentation

## 2014-07-25 DIAGNOSIS — G8190 Hemiplegia, unspecified affecting unspecified side: Secondary | ICD-10-CM | POA: Insufficient documentation

## 2014-09-24 ENCOUNTER — Emergency Department (HOSPITAL_COMMUNITY)
Admission: EM | Admit: 2014-09-24 | Discharge: 2014-09-24 | Disposition: A | Discharge status: Home / Self Care | Payer: Medicare Other | Attending: Emergency Medicine | Admitting: Emergency Medicine

## 2014-09-24 ENCOUNTER — Encounter (HOSPITAL_COMMUNITY): Payer: Self-pay

## 2014-09-24 ENCOUNTER — Emergency Department (HOSPITAL_COMMUNITY): Payer: Medicare Other

## 2014-09-24 DIAGNOSIS — R531 Weakness: Secondary | ICD-10-CM | POA: Diagnosis not present

## 2014-09-24 DIAGNOSIS — Z8673 Personal history of transient ischemic attack (TIA), and cerebral infarction without residual deficits: Secondary | ICD-10-CM | POA: Diagnosis not present

## 2014-09-24 DIAGNOSIS — I10 Essential (primary) hypertension: Secondary | ICD-10-CM | POA: Diagnosis not present

## 2014-09-24 DIAGNOSIS — Z87448 Personal history of other diseases of urinary system: Secondary | ICD-10-CM | POA: Diagnosis not present

## 2014-09-24 DIAGNOSIS — M25522 Pain in left elbow: Secondary | ICD-10-CM | POA: Diagnosis not present

## 2014-09-24 DIAGNOSIS — M5412 Radiculopathy, cervical region: Secondary | ICD-10-CM | POA: Insufficient documentation

## 2014-09-24 DIAGNOSIS — M542 Cervicalgia: Secondary | ICD-10-CM | POA: Diagnosis present

## 2014-09-24 DIAGNOSIS — R2689 Other abnormalities of gait and mobility: Secondary | ICD-10-CM | POA: Insufficient documentation

## 2014-09-24 DIAGNOSIS — Z8619 Personal history of other infectious and parasitic diseases: Secondary | ICD-10-CM | POA: Insufficient documentation

## 2014-09-24 DIAGNOSIS — M545 Low back pain: Secondary | ICD-10-CM | POA: Diagnosis not present

## 2014-09-24 DIAGNOSIS — Z8719 Personal history of other diseases of the digestive system: Secondary | ICD-10-CM | POA: Insufficient documentation

## 2014-09-24 DIAGNOSIS — Z79899 Other long term (current) drug therapy: Secondary | ICD-10-CM | POA: Diagnosis not present

## 2014-09-24 DIAGNOSIS — Z720 Tobacco use: Secondary | ICD-10-CM | POA: Diagnosis not present

## 2014-09-24 LAB — TROPONIN I

## 2014-09-24 LAB — CBC WITH DIFFERENTIAL/PLATELET
Basophils Absolute: 0 10*3/uL (ref 0.0–0.1)
Basophils Relative: 1 % (ref 0–1)
EOS ABS: 0.2 10*3/uL (ref 0.0–0.7)
Eosinophils Relative: 3 % (ref 0–5)
HCT: 40.1 % (ref 39.0–52.0)
Hemoglobin: 13.1 g/dL (ref 13.0–17.0)
LYMPHS ABS: 2.2 10*3/uL (ref 0.7–4.0)
Lymphocytes Relative: 40 % (ref 12–46)
MCH: 29.2 pg (ref 26.0–34.0)
MCHC: 32.7 g/dL (ref 30.0–36.0)
MCV: 89.3 fL (ref 78.0–100.0)
Monocytes Absolute: 0.7 10*3/uL (ref 0.1–1.0)
Monocytes Relative: 12 % (ref 3–12)
Neutro Abs: 2.3 10*3/uL (ref 1.7–7.7)
Neutrophils Relative %: 44 % (ref 43–77)
PLATELETS: 148 10*3/uL — AB (ref 150–400)
RBC: 4.49 MIL/uL (ref 4.22–5.81)
RDW: 14.2 % (ref 11.5–15.5)
WBC: 5.4 10*3/uL (ref 4.0–10.5)

## 2014-09-24 LAB — BASIC METABOLIC PANEL
Anion gap: 7 (ref 5–15)
BUN: 14 mg/dL (ref 6–20)
CHLORIDE: 101 mmol/L (ref 101–111)
CO2: 29 mmol/L (ref 22–32)
CREATININE: 1.06 mg/dL (ref 0.61–1.24)
Calcium: 8.8 mg/dL — ABNORMAL LOW (ref 8.9–10.3)
GFR calc non Af Amer: >60 mL/min (ref >60)
Glucose, Bld: 82 mg/dL (ref 65–99)
Potassium: 3.5 mmol/L (ref 3.5–5.1)
SODIUM: 137 mmol/L (ref 135–145)

## 2014-09-24 MED ORDER — OXYCODONE-ACETAMINOPHEN 5-325 MG PO TABS
1.0000 | ORAL_TABLET | Freq: Once | ORAL | Status: AC
  Administered 2014-09-24: 1 via ORAL
  Filled 2014-09-24: qty 1

## 2014-09-24 MED ORDER — CYCLOBENZAPRINE HCL 5 MG PO TABS: 5.0000 mg | ORAL_TABLET | Freq: Three times a day (TID) | ORAL | Status: DC | PRN

## 2014-09-24 MED ORDER — OXYCODONE-ACETAMINOPHEN 5-325 MG PO TABS: 1.0000 | ORAL_TABLET | ORAL | Status: DC | PRN

## 2014-09-24 MED ORDER — IOHEXOL 350 MG/ML SOLN
150.0000 mL | Freq: Once | INTRAVENOUS | Status: AC | PRN
  Administered 2014-09-24: 150 mL via INTRAVENOUS

## 2014-09-24 NOTE — ED Notes (Signed)
Patient with no complaints at this time. Respirations even and unlabored. Skin warm/dry. Discharge instructions reviewed with patient at this time. Patient given opportunity to voice concerns/ask questions. IV removed per policy and band-aid applied to site. Patient discharged at this time and left Emergency Department with steady gait.  

## 2014-09-24 NOTE — Discharge Instructions (Signed)
Cervical Radiculopathy Cervical radiculopathy happens when a nerve in the neck is pinched or bruised by a slipped (herniated) disk or by arthritic changes in the bones of the cervical spine. This can occur due to an injury or as part of the normal aging process. Pressure on the cervical nerves can cause pain or numbness that runs from your neck all the way down into your arm and fingers. CAUSES  There are many possible causes, including:  Injury.  Muscle tightness in the neck from overuse.  Swollen, painful joints (arthritis).  Breakdown or degeneration in the bones and joints of the spine (spondylosis) due to aging.  Bone spurs that may develop near the cervical nerves. SYMPTOMS  Symptoms include pain, weakness, or numbness in the affected arm and hand. Pain can be severe or irritating. Symptoms may be worse when extending or turning the neck. DIAGNOSIS  Your caregiver will ask about your symptoms and do a physical exam. He or she may test your strength and reflexes. X-rays, CT scans, and MRI scans may be needed in cases of injury or if the symptoms do not go away after a period of time. Electromyography (EMG) or nerve conduction testing may be done to study how your nerves and muscles are working. TREATMENT  Your caregiver may recommend certain exercises to help relieve your symptoms. Cervical radiculopathy can, and often does, get better with time and treatment. If your problems continue, treatment options may include:  Wearing a soft collar for short periods of time.  Physical therapy to strengthen the neck muscles.  Medicines, such as nonsteroidal anti-inflammatory drugs (NSAIDs), oral corticosteroids, or spinal injections.  Surgery. Different types of surgery may be done depending on the cause of your problems. HOME CARE INSTRUCTIONS   Put ice on the affected area.  Put ice in a plastic bag.  Place a towel between your skin and the bag.  Leave the ice on for 15-20 minutes,  03-04 times a day or as directed by your caregiver.  If ice does not help, you can try using heat. Take a warm shower or bath, or use a hot water bottle as directed by your caregiver.  You may try a gentle neck and shoulder massage.  Use a flat pillow when you sleep.  Only take over-the-counter or prescription medicines for pain, discomfort, or fever as directed by your caregiver.  If physical therapy was prescribed, follow your caregiver's directions.  If a soft collar was prescribed, use it as directed. SEEK IMMEDIATE MEDICAL CARE IF:   Your pain gets much worse and cannot be controlled with medicines.  You have weakness or numbness in your hand, arm, face, or leg.  You have a high fever or a stiff, rigid neck.  You lose bowel or bladder control (incontinence).  You have trouble with walking, balance, or speaking. MAKE SURE YOU:   Understand these instructions.  Will watch your condition.  Will get help right away if you are not doing well or get worse. Document Released: 11/02/2000 Document Revised: 05/02/2011 Document Reviewed: 09/21/2010 [facility] Patient Information 2015 [place], [place]. This information is not intended to replace advice given to you by your health care provider. Make sure you discuss any questions you have with your health care provider.    Use the medicines prescribed for your pain.  Followup with your doctor for a recheck if your symptoms worsen or are not improved with this treatment.  I also suggest using a heating pad 20 minutes several times daily  which can help relax the muscles in your neck and shoulder.  Get rechecked immediately for any weakness, numbness or worsening pain in your neck or arms.

## 2014-09-24 NOTE — ED Notes (Signed)
Patient complaining of pain in bilateral elbows and upper arms, both shoulders, and neck pain.  States that it started in left arm and feels like it is moving all to the right side now. Denies chest pain, shortness of breath, or nausea and vomiting. States that is hurts to nod his head, but does not hurt to turn his head from left to right. Hurts some to shrug his shoulders.

## 2014-09-25 NOTE — ED Provider Notes (Signed)
CSN: [id number]     Arrival date & time 09/24/14  1548 History   First MD Initiated Contact with Patient 09/24/14 1746     No chief complaint on file.    (Consider location/radiation/quality/duration/timing/severity/associated sxs/prior Treatment) The history is provided by the patient.   Brian [name] is a 69 y.o. male presenting with pain since yesterday starting in his left elbow and has progressed to now radiate across his shoulder, cervical spine through the right shoulder.  He denies any injury, denies chest pain, shortness of breath, headache.  He reports his pain is aching, constant and worsened with movement, particularly flexion and dorsiflexion of his neck.  He can rotate his head laterally and he "feels it" but denies pain with this movement.  He can place his hands on his occipital scalp and gently push the head forward, relieving pain.  He denies fevers, chills, headache, dizziness.  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  He has taken tramadol then hydrocodone today without significant relief of pain.    Past Medical History  Diagnosis Date  . Hypertension   . CVA (cerebral infarction)   . 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 Renal disorder   . Sepsis(995.91)   . Vascular insufficiency   . Stroke    Past Surgical History  Procedure Laterality Date  . Back surgery  2002/2009  . Laparotomy  12/18/2010    Procedure: EXPLORATORY LAPAROTOMY;  Surgeon: Donato Heinz;  Location: [facility];  Service: General;  Laterality: N/A;  . Partial colectomy  12/18/2010    Procedure: PARTIAL COLECTOMY;  Surgeon: Donato Heinz;  Location: [facility];  Service: General;  Laterality: N/A;  . Radiology with anesthesia N/A 02/20/2014    Procedure: RADIOLOGY WITH ANESTHESIA;  Surgeon: [name], MD;  Location: [facility];  Service: Radiology;   Laterality: N/A;   Family History  Problem Relation Age of Onset  . Colon cancer Neg Hx   . Liver disease Neg Hx    History  Substance Use Topics  . Smoking status: Current Every Day Smoker -- 0.50 packs/day for 40 years    Types: Cigarettes  . Smokeless tobacco: Never Used     Comment: 2-3 cigarettes per day  . Alcohol Use: Yes    Review of Systems  Constitutional: Negative for fever and chills.  HENT: Negative for congestion, ear pain, sore throat and tinnitus.   Eyes: Negative.  Negative for photophobia and visual disturbance.  Respiratory: Negative for chest tightness and shortness of breath.   Cardiovascular: Negative for chest pain.  Gastrointestinal: Negative for nausea and abdominal pain.  Genitourinary: Negative.   Musculoskeletal: Positive for back pain and neck pain. Negative for joint swelling and arthralgias.  Skin: Negative.  Negative for rash and wound.  Neurological: Positive for weakness. Negative for dizziness, light-headedness, numbness and headaches.  Psychiatric/Behavioral: Negative.       Allergies  Review of patient's allergies indicates no known allergies.  Home Medications   Prior to Admission medications   Medication Sig Start Date End Date Taking? Authorizing Provider  amLODipine (NORVASC) 10 MG tablet Take 1 tablet by mouth daily. 02/11/14  Yes Historical Provider, MD  doxepin (SINEQUAN) 25 MG capsule Take 2 capsules by mouth at bedtime. 02/11/14  Yes Historical Provider, MD  HYDROcodone-acetaminophen (NORCO) 10-325 MG per tablet Take 1 tablet by  mouth 2 (two) times daily as needed. 10/22/13  Yes Historical Provider, MD  rOPINIRole (REQUIP) 0.25 MG tablet Take 1 tablet (0.25 mg total) by mouth at bedtime. 03/19/14  Yes Daniel J Angiulli, PA-C  tiZANidine (ZANAFLEX) 2 MG tablet Take 1 tablet (2 mg total) by mouth every 8 (eight) hours as needed for muscle spasms. 05/15/14  Yes [name], MD  triamterene-hydrochlorothiazide (DYAZIDE) 37.5-25  MG per capsule Take 1 capsule by mouth at bedtime. 02/12/14  Yes Historical Provider, MD  acetaminophen (TYLENOL) 325 MG tablet Take 650 mg by mouth every 4 (four) hours as needed. For temp > 101.    Historical Provider, MD  diazepam (VALIUM) 5 MG tablet Take 1 tablet (5 mg total) by mouth once. Patient not taking: Reported on 09/24/2014 05/15/14   [name], MD  oxyCODONE-acetaminophen (PERCOCET/ROXICET) 5-325 MG per tablet Take 1 tablet by mouth every 4 (four) hours as needed. 09/24/14   Evalee Jefferson, PA-C  pantoprazole sodium (PROTONIX) 40 mg/20 mL PACK Take 20 mLs (40 mg total) by mouth at bedtime. Patient not taking: Reported on 05/15/2014 03/19/14   Lavon Paganini Angiulli, PA-C  traZODone (DESYREL) 50 MG tablet Take 1 tablet (50 mg total) by mouth at bedtime as needed for sleep. 03/19/14   Daniel J Angiulli, PA-C   BP 141/108 mmHg  Pulse 95  Temp(Src) 98.3 F (36.8 C) (Oral)  Resp 16  SpO2 100% Physical Exam  Constitutional: He is oriented to person, place, and time. He appears well-developed and well-nourished.  HENT:  Head: Normocephalic and atraumatic.  Right Ear: Tympanic membrane normal.  Left Ear: Tympanic membrane normal.  Nose: Nose normal.  Mouth/Throat: Oropharynx is clear and moist.  Eyes: EOM are normal. Pupils are equal, round, and reactive to light.  Neck: Normal range of motion. Neck supple. Spinous process tenderness and muscular tenderness present. Carotid bruit is not present.  Cardiovascular: Normal rate and normal heart sounds.   Pulses:      Radial pulses are 2+ on the right side, and 2+ on the left side.  Pulmonary/Chest: Effort normal and breath sounds normal. He has no decreased breath sounds. He has no wheezes. He has no rhonchi. He has no rales.  Abdominal: Soft. There is no tenderness.  Musculoskeletal: Normal range of motion.       Cervical back: He exhibits bony tenderness. He exhibits normal range of motion, no edema, no deformity and no spasm.   Lymphadenopathy:    He has no cervical adenopathy.  Neurological: He is alert and oriented to person, place, and time. No cranial nerve deficit or sensory deficit. Gait abnormal. GCS eye subscore is 4. GCS verbal subscore is 5. GCS motor subscore is 6.  Left upper extremity with decreased strength, 3/5 left grip vs 5/5 right, baseline per patient.  Cranial nerves III-XII intact.   Antalgic gait.  (using a motorized wheelchair at baseline, battery dead so is ambulating using a cane, but is difficult for him).  Skin: Skin is warm and dry. No rash noted.  Psychiatric: He has a normal mood and affect. His speech is normal and behavior is normal. Thought content normal. Cognition and memory are normal.  Nursing note and vitals reviewed.   ED Course  Procedures (including critical care time) Labs Review Labs Reviewed  CBC WITH DIFFERENTIAL/PLATELET - Abnormal; Notable for the following:    Platelets 148 (*)    All other components within normal limits  BASIC METABOLIC PANEL - Abnormal; Notable for the  following:    Calcium 8.8 (*)    All other components within normal limits  TROPONIN I    Imaging Review Ct Angio Head W/cm &/or Wo Cm  09/24/2014   ADDENDUM REPORT: 09/24/2014 23:20  ADDENDUM: Note that the second item of the impression on the CTA of the head and neck should read:  Stable coil pack at the right ICA terminus, without evidence for residual or recurrent aneurysm.   Electronically Signed   By: Garald Balding M.D.   On: 09/24/2014 23:20   09/24/2014   CLINICAL DATA:  Bilateral neck pain. Pain with not Ing head or shrug in shoulders. Personal history of cerebral aneurysm and subarachnoid hemorrhage.  EXAM: CT ANGIOGRAPHY HEAD AND NECK  TECHNIQUE: Multidetector CT imaging of the head and neck was performed using the standard protocol during bolus administration of intravenous contrast. Multiplanar CT image reconstructions and MIPs were obtained to evaluate the vascular anatomy. Carotid  stenosis measurements (when applicable) are obtained utilizing NASCET criteria, using the distal internal carotid diameter as the denominator.  CONTRAST:  150mL OMNIPAQUE IOHEXOL 350 MG/ML SOLN  COMPARISON:  CT head without contrast 02/21/2014.  FINDINGS: CT HEAD  Brain: A high anterior nonhemorrhagic right frontal lobe infarct appears remote. Right ICA terminus aneurysm coil pack is again noted. This creates some artifact. No acute cortical infarct, hemorrhage, or mass lesion is present.  Calvarium and skull base: Within normal limits.  Paranasal sinuses: Clear  Orbits: Within normal limits.  CTA NECK  Aortic arch: A 3 vessel arch configuration is present. Mild atherosclerotic calcifications are present at the origin of the subclavian artery without a significant stenosis.  Right carotid system: The right common carotid artery is within normal limits. There are some calcifications at the proximal right ICA. There is mild proximal tortuosity without significant stenosis.  Left carotid system: The left common carotid artery is within normal limits. Calcifications are present at the bifurcation. There is mild proximal tortuosity without significant stenosis.  Vertebral arteries:The vertebral arteries both originate from the subclavian arteries. The a left vertebral artery is the dominant vessel. There is no significant stenosis or evidence for vascular injury within the neck.  Skeleton: Bone windows again demonstrate multilevel degenerative changes and uncovertebral spurring throughout the cervical spine. No focal lytic or blastic lesions are present.  Other neck: The soft tissues of the neck are unremarkable. Salivary glands are normal. The thyroid is within normal limits. The lung apices demonstrate emphysematous change.  CTA HEAD  Anterior circulation: Mild atherosclerotic changes are present within the cavernous internal carotid arteries bilaterally. There is no significant stenosis. The right ICA terminus coil  pack is intact. No residual or recurrent aneurysm is present. The A1 and M1 segments are intact bilaterally. ACA and MCA branch vessels are intact.  Posterior circulation: The left vertebral artery is dominant. The PICA origins are visualized and normal. The basilar artery is within normal limits. A 3 mm infundibulum is present on the right near the basilar tip. There is no aneurysm. The PCA branch vessels are within normal limits.  Venous sinuses: The dural sinuses are patent. The straight sinus and deep cerebral veins are intact. Cortical veins are within normal limits.  Anatomic variants: None  Delayed phase: The postcontrast images demonstrate no pathologic enhancement.  IMPRESSION: 1. Mild atherosclerotic calcifications at the carotid bifurcations bilaterally, the origin of the left subclavian artery common within the cavernous internal carotid arteries bilaterally without significant stenosis. 2. Unstable cold pack at the right ICA  terminus without evidence for residual or recurrent aneurysm. 3. 3 mm infundibulum at the base of the right posterior communicating artery. 4. Remote infarct of the right frontal lobe.  Electronically Signed: By: San Morelle M.D. On: 09/24/2014 21:03   Ct Angio Neck W/cm &/or Wo/cm  09/24/2014   ADDENDUM REPORT: 09/24/2014 23:20  ADDENDUM: Note that the second item of the impression on the CTA of the head and neck should read:  Stable coil pack at the right ICA terminus, without evidence for residual or recurrent aneurysm.   Electronically Signed   By: Garald Balding M.D.   On: 09/24/2014 23:20   09/24/2014   CLINICAL DATA:  Bilateral neck pain. Pain with not Ing head or shrug in shoulders. Personal history of cerebral aneurysm and subarachnoid hemorrhage.  EXAM: CT ANGIOGRAPHY HEAD AND NECK  TECHNIQUE: Multidetector CT imaging of the head and neck was performed using the standard protocol during bolus administration of intravenous contrast. Multiplanar CT image  reconstructions and MIPs were obtained to evaluate the vascular anatomy. Carotid stenosis measurements (when applicable) are obtained utilizing NASCET criteria, using the distal internal carotid diameter as the denominator.  CONTRAST:  172mL OMNIPAQUE IOHEXOL 350 MG/ML SOLN  COMPARISON:  CT head without contrast 02/21/2014.  FINDINGS: CT HEAD  Brain: A high anterior nonhemorrhagic right frontal lobe infarct appears remote. Right ICA terminus aneurysm coil pack is again noted. This creates some artifact. No acute cortical infarct, hemorrhage, or mass lesion is present.  Calvarium and skull base: Within normal limits.  Paranasal sinuses: Clear  Orbits: Within normal limits.  CTA NECK  Aortic arch: A 3 vessel arch configuration is present. Mild atherosclerotic calcifications are present at the origin of the subclavian artery without a significant stenosis.  Right carotid system: The right common carotid artery is within normal limits. There are some calcifications at the proximal right ICA. There is mild proximal tortuosity without significant stenosis.  Left carotid system: The left common carotid artery is within normal limits. Calcifications are present at the bifurcation. There is mild proximal tortuosity without significant stenosis.  Vertebral arteries:The vertebral arteries both originate from the subclavian arteries. The a left vertebral artery is the dominant vessel. There is no significant stenosis or evidence for vascular injury within the neck.  Skeleton: Bone windows again demonstrate multilevel degenerative changes and uncovertebral spurring throughout the cervical spine. No focal lytic or blastic lesions are present.  Other neck: The soft tissues of the neck are unremarkable. Salivary glands are normal. The thyroid is within normal limits. The lung apices demonstrate emphysematous change.  CTA HEAD  Anterior circulation: Mild atherosclerotic changes are present within the cavernous internal carotid  arteries bilaterally. There is no significant stenosis. The right ICA terminus coil pack is intact. No residual or recurrent aneurysm is present. The A1 and M1 segments are intact bilaterally. ACA and MCA branch vessels are intact.  Posterior circulation: The left vertebral artery is dominant. The PICA origins are visualized and normal. The basilar artery is within normal limits. A 3 mm infundibulum is present on the right near the basilar tip. There is no aneurysm. The PCA branch vessels are within normal limits.  Venous sinuses: The dural sinuses are patent. The straight sinus and deep cerebral veins are intact. Cortical veins are within normal limits.  Anatomic variants: None  Delayed phase: The postcontrast images demonstrate no pathologic enhancement.  IMPRESSION: 1. Mild atherosclerotic calcifications at the carotid bifurcations bilaterally, the origin of the left subclavian artery common within  the cavernous internal carotid arteries bilaterally without significant stenosis. 2. Unstable cold pack at the right ICA terminus without evidence for residual or recurrent aneurysm. 3. 3 mm infundibulum at the base of the right posterior communicating artery. 4. Remote infarct of the right frontal lobe.  Electronically Signed: By: San Morelle M.D. On: 09/24/2014 21:03   Ct Angio Chest Aorta W/cm &/or Wo/cm  09/24/2014   CLINICAL DATA:  Patient with pain in the bilateral elbows, upper arms, shoulders and both sides of the neck.  EXAM: CT ANGIOGRAPHY CHEST, ABDOMEN AND PELVIS  TECHNIQUE: Multidetector CT imaging through the chest, abdomen and pelvis was performed using the standard protocol during bolus administration of intravenous contrast. Multiplanar reconstructed images and MIPs were obtained and reviewed to evaluate the vascular anatomy.  CONTRAST:  144mL OMNIPAQUE IOHEXOL 350 MG/ML SOLN  COMPARISON:  CT chest abdomen pelvis 02/20/2014  FINDINGS: CTA CHEST FINDINGS  Mediastinum/Nodes: Visualized thyroid  is unremarkable. Normal heart size. No pericardial effusion. No axillary, mediastinal or hilar lymphadenopathy. Ascending thoracic aorta measures 4.0 cm. Noncontrast imaging demonstrates no intramural high attenuation to suggest hemorrhage. No evidence for dissection involving the thoracic aorta.  Lungs/Pleura: Central airways are patent. Dependent atelectasis within the left lower lobe. Biapical bullous change. No pleural effusion or pneumothorax.  Review of the MIP images confirms the above findings.  CTA ABDOMEN AND PELVIS FINDINGS  Hepatobiliary: Re- demonstrated peripheral abnormal enhancement within the right hepatic lobe (image 93; series 5). No hepatic lesions are identified. Gallbladder is decompressed. No intrahepatic or extrahepatic biliary ductal dilatation.  Pancreas: Unremarkable  Spleen: Unremarkable  Adrenals/Urinary Tract: Normal adrenal glands. Kidneys enhance symmetrically with contrast. Excreted contrast material is demonstrated within the bilateral renal collecting systems and ureters as well as the urinary bladder. Mild circumferential wall thickening of the urinary bladder.  Stomach/Bowel: No abnormal bowel wall thickening or evidence for bowel obstruction. Status post right hemicolectomy. Descending and sigmoid colonic diverticulosis. Mild inflammatory change about a small diverticulum within the descending colon (image 122; series 5). No free fluid or free intraperitoneal air.  Vascular/Lymphatic: Atherosclerotic calcification is demonstrated throughout the abdominal aorta which is otherwise normal in caliber. No evidence for dissection flap. The celiac axis is widely patent. The superior mesenteric artery is patent. The main renal arteries are patent bilaterally. Infrarenal abdominal aortic ectasia measuring 2.6 cm. Ectatic bilateral common iliac arteries measuring 1.5 cm on the right 1.2 cm in the left. No retroperitoneal lymphadenopathy.  Other: None  Musculoskeletal: Lumbar spine  degenerative changes.  Review of the MIP images confirms the above findings.  IMPRESSION: No evidence for dissection involving the thoracic or abdominal aorta.  Small diverticulum within the proximal descending colon with mild surrounding inflammatory stranding may represent acute diverticulitis.  Infrarenal abdominal aortic ectasia measuring 2.6 cm. Ectatic abdominal aorta at risk for aneurysm development. Recommend followup by ultrasound in 5 years. This recommendation follows ACR consensus guidelines: White Paper of the ACR Incidental Findings Committee II on Vascular Findings. J Am Coll Radiol 2013; 10:789-794.  Ascending thoracic aorta measures 4.0 cm. Recommend semi-annual imaging followup by CTA or MRA and referral to cardiothoracic surgery if not already obtained. This recommendation follows 2010 ACCF/AHA/AATS/ACR/ASA/SCA/SCAI/SIR/STS/SVM Guidelines for the Diagnosis and Management of Patients With Thoracic Aortic Disease. Circulation. 2010; 121: e266-e36  Abnormal peripheral enhancement within the right hepatic lobe is nonspecific however likely represents a vascular malformation. This can be further evaluated with pre and post contrast-enhanced MRI in the non acute setting.   Electronically Signed  By: Lovey Newcomer M.D.   On: 09/24/2014 21:06   Ct Cta Abd/pel W/cm &/or W/o Cm  09/24/2014   CLINICAL DATA:  Patient with pain in the bilateral elbows, upper arms, shoulders and both sides of the neck.  EXAM: CT ANGIOGRAPHY CHEST, ABDOMEN AND PELVIS  TECHNIQUE: Multidetector CT imaging through the chest, abdomen and pelvis was performed using the standard protocol during bolus administration of intravenous contrast. Multiplanar reconstructed images and MIPs were obtained and reviewed to evaluate the vascular anatomy.  CONTRAST:  179mL OMNIPAQUE IOHEXOL 350 MG/ML SOLN  COMPARISON:  CT chest abdomen pelvis 02/20/2014  FINDINGS: CTA CHEST FINDINGS  Mediastinum/Nodes: Visualized thyroid is unremarkable. Normal  heart size. No pericardial effusion. No axillary, mediastinal or hilar lymphadenopathy. Ascending thoracic aorta measures 4.0 cm. Noncontrast imaging demonstrates no intramural high attenuation to suggest hemorrhage. No evidence for dissection involving the thoracic aorta.  Lungs/Pleura: Central airways are patent. Dependent atelectasis within the left lower lobe. Biapical bullous change. No pleural effusion or pneumothorax.  Review of the MIP images confirms the above findings.  CTA ABDOMEN AND PELVIS FINDINGS  Hepatobiliary: Re- demonstrated peripheral abnormal enhancement within the right hepatic lobe (image 93; series 5). No hepatic lesions are identified. Gallbladder is decompressed. No intrahepatic or extrahepatic biliary ductal dilatation.  Pancreas: Unremarkable  Spleen: Unremarkable  Adrenals/Urinary Tract: Normal adrenal glands. Kidneys enhance symmetrically with contrast. Excreted contrast material is demonstrated within the bilateral renal collecting systems and ureters as well as the urinary bladder. Mild circumferential wall thickening of the urinary bladder.  Stomach/Bowel: No abnormal bowel wall thickening or evidence for bowel obstruction. Status post right hemicolectomy. Descending and sigmoid colonic diverticulosis. Mild inflammatory change about a small diverticulum within the descending colon (image 122; series 5). No free fluid or free intraperitoneal air.  Vascular/Lymphatic: Atherosclerotic calcification is demonstrated throughout the abdominal aorta which is otherwise normal in caliber. No evidence for dissection flap. The celiac axis is widely patent. The superior mesenteric artery is patent. The main renal arteries are patent bilaterally. Infrarenal abdominal aortic ectasia measuring 2.6 cm. Ectatic bilateral common iliac arteries measuring 1.5 cm on the right 1.2 cm in the left. No retroperitoneal lymphadenopathy.  Other: None  Musculoskeletal: Lumbar spine degenerative changes.  Review  of the MIP images confirms the above findings.  IMPRESSION: No evidence for dissection involving the thoracic or abdominal aorta.  Small diverticulum within the proximal descending colon with mild surrounding inflammatory stranding may represent acute diverticulitis.  Infrarenal abdominal aortic ectasia measuring 2.6 cm. Ectatic abdominal aorta at risk for aneurysm development. Recommend followup by ultrasound in 5 years. This recommendation follows ACR consensus guidelines: White Paper of the ACR Incidental Findings Committee II on Vascular Findings. J Am Coll Radiol 2013; 10:789-794.  Ascending thoracic aorta measures 4.0 cm. Recommend semi-annual imaging followup by CTA or MRA and referral to cardiothoracic surgery if not already obtained. This recommendation follows 2010 ACCF/AHA/AATS/ACR/ASA/SCA/SCAI/SIR/STS/SVM Guidelines for the Diagnosis and Management of Patients With Thoracic Aortic Disease. Circulation. 2010; 121: e266-e36  Abnormal peripheral enhancement within the right hepatic lobe is nonspecific however likely represents a vascular malformation. This can be further evaluated with pre and post contrast-enhanced MRI in the non acute setting.   Electronically Signed   By: Lovey Newcomer M.D.   On: 09/24/2014 21:06     EKG Interpretation None      ED ECG REPORT   Date: 09/25/2014  Rate: 101  Rhythm: normal sinus rhythm  QRS Axis: normal  Intervals: normal  ST/T Wave  abnormalities: nonspecific T wave changes  Conduction Disutrbances:none  Narrative Interpretation:   Old EKG Reviewed: unchanged compared to 02/20/14  I have personally reviewed the EKG tracing and agree with the computerized printout as noted.  MDM   Final diagnoses:  Neck pain  Cervical radiculopathy    Patients labs and/or radiological studies were reviewed and considered during the medical decision making and disposition process.   Imaging was reviewed, interpreted and I agree with radiologists reading.   Results were also discussed with patient.  Pt was also seen by Dr Wyvonnia Dusky during this visit.  Pt was placed on oxycodone, given dose here which he states relieved his pain.  He was also encouraged to start back taking his diazepam (has, but has been holding this medicine, advised to take for the next several days for cervical spasm).  Discussed case with Dr. Merlene Laughter, local neurologist regarding findings and improvement in pain with forward pressure on posterior head, favors cervical radiculopathy.  CT's tonight have ruled out new aneurysms, exam is c/w musculoskeletal source given worsened with movement and certain positions. He does have borderline elevated diameter of his ascending thoracic aorta, referral sent to Dr. Roxan Hockey, patient notified of finding and he should have a repeat study in 6 months to follow this potential problem.   Evalee Jefferson, PA-C 09/25/14 1537  Ezequiel Essex, MD 09/25/14 1140

## 2015-04-19 ENCOUNTER — Emergency Department (HOSPITAL_COMMUNITY): Payer: Medicare Other

## 2015-04-19 ENCOUNTER — Observation Stay (HOSPITAL_COMMUNITY)
Admission: EM | Admit: 2015-04-19 | Discharge: 2015-04-20 | Disposition: A | Discharge status: Home / Self Care | Payer: Medicare Other | Attending: Internal Medicine | Admitting: Internal Medicine

## 2015-04-19 ENCOUNTER — Encounter (HOSPITAL_COMMUNITY): Payer: Self-pay | Admitting: *Deleted

## 2015-04-19 DIAGNOSIS — F17210 Nicotine dependence, cigarettes, uncomplicated: Secondary | ICD-10-CM | POA: Insufficient documentation

## 2015-04-19 DIAGNOSIS — S0230XA Fracture of orbital floor, unspecified side, initial encounter for closed fracture: Principal | ICD-10-CM | POA: Insufficient documentation

## 2015-04-19 DIAGNOSIS — Y9289 Other specified places as the place of occurrence of the external cause: Secondary | ICD-10-CM | POA: Diagnosis not present

## 2015-04-19 DIAGNOSIS — Z8673 Personal history of transient ischemic attack (TIA), and cerebral infarction without residual deficits: Secondary | ICD-10-CM | POA: Diagnosis not present

## 2015-04-19 DIAGNOSIS — S065X9A Traumatic subdural hemorrhage with loss of consciousness of unspecified duration, initial encounter: Secondary | ICD-10-CM | POA: Diagnosis present

## 2015-04-19 DIAGNOSIS — I6200 Nontraumatic subdural hemorrhage, unspecified: Secondary | ICD-10-CM

## 2015-04-19 DIAGNOSIS — S0230XS Fracture of orbital floor, unspecified side, sequela: Secondary | ICD-10-CM

## 2015-04-19 DIAGNOSIS — Z79899 Other long term (current) drug therapy: Secondary | ICD-10-CM | POA: Diagnosis not present

## 2015-04-19 DIAGNOSIS — N289 Disorder of kidney and ureter, unspecified: Secondary | ICD-10-CM | POA: Diagnosis not present

## 2015-04-19 DIAGNOSIS — S065XAA Traumatic subdural hemorrhage with loss of consciousness status unknown, initial encounter: Secondary | ICD-10-CM | POA: Diagnosis present

## 2015-04-19 DIAGNOSIS — Z8619 Personal history of other infectious and parasitic diseases: Secondary | ICD-10-CM | POA: Insufficient documentation

## 2015-04-19 DIAGNOSIS — I998 Other disorder of circulatory system: Secondary | ICD-10-CM | POA: Insufficient documentation

## 2015-04-19 DIAGNOSIS — K529 Noninfective gastroenteritis and colitis, unspecified: Secondary | ICD-10-CM | POA: Insufficient documentation

## 2015-04-19 DIAGNOSIS — Y9389 Activity, other specified: Secondary | ICD-10-CM | POA: Diagnosis not present

## 2015-04-19 DIAGNOSIS — I10 Essential (primary) hypertension: Secondary | ICD-10-CM | POA: Insufficient documentation

## 2015-04-19 DIAGNOSIS — Y998 Other external cause status: Secondary | ICD-10-CM | POA: Diagnosis not present

## 2015-04-19 DIAGNOSIS — S0993XA Unspecified injury of face, initial encounter: Secondary | ICD-10-CM | POA: Diagnosis present

## 2015-04-19 DIAGNOSIS — S0285XA Fracture of orbit, unspecified, initial encounter for closed fracture: Secondary | ICD-10-CM

## 2015-04-19 LAB — CBC WITH DIFFERENTIAL/PLATELET
Basophils Absolute: 0 10*3/uL (ref 0.0–0.1)
Basophils Relative: 1 %
EOS ABS: 0.1 10*3/uL (ref 0.0–0.7)
EOS PCT: 2 %
HCT: 39.8 % (ref 39.0–52.0)
Hemoglobin: 12.8 g/dL — ABNORMAL LOW (ref 13.0–17.0)
LYMPHS PCT: 37 %
Lymphs Abs: 2.3 10*3/uL (ref 0.7–4.0)
MCH: 28.7 pg (ref 26.0–34.0)
MCHC: 32.2 g/dL (ref 30.0–36.0)
MCV: 89.2 fL (ref 78.0–100.0)
MONO ABS: 0.6 10*3/uL (ref 0.1–1.0)
Monocytes Relative: 10 %
NEUTROS PCT: 50 %
Neutro Abs: 3.2 10*3/uL (ref 1.7–7.7)
PLATELETS: 157 10*3/uL (ref 150–400)
RBC: 4.46 MIL/uL (ref 4.22–5.81)
RDW: 14.3 % (ref 11.5–15.5)
WBC: 6.2 10*3/uL (ref 4.0–10.5)

## 2015-04-19 LAB — BASIC METABOLIC PANEL
Anion gap: 6 (ref 5–15)
BUN: 14 mg/dL (ref 6–20)
CO2: 27 mmol/L (ref 22–32)
Calcium: 9.1 mg/dL (ref 8.9–10.3)
Chloride: 106 mmol/L (ref 101–111)
Creatinine, Ser: 0.99 mg/dL (ref 0.61–1.24)
GFR calc non Af Amer: >60 mL/min (ref >60)
Glucose, Bld: 95 mg/dL (ref 65–99)
Potassium: 4.1 mmol/L (ref 3.5–5.1)
SODIUM: 139 mmol/L (ref 135–145)

## 2015-04-19 LAB — MRSA PCR SCREENING: MRSA by PCR: NEGATIVE

## 2015-04-19 MED ORDER — ACETAMINOPHEN 500 MG PO TABS
1000.0000 mg | ORAL_TABLET | Freq: Once | ORAL | Status: AC
  Administered 2015-04-19: 1000 mg via ORAL
  Filled 2015-04-19: qty 2

## 2015-04-19 MED ORDER — SODIUM CHLORIDE 0.9 % IV SOLN: 250.0000 mL | INTRAVENOUS | Status: DC | PRN

## 2015-04-19 MED ORDER — TIZANIDINE HCL 2 MG PO CAPS
2.0000 mg | ORAL_CAPSULE | Freq: Two times a day (BID) | ORAL | Status: DC
  Filled 2015-04-19 (×2): qty 1

## 2015-04-19 MED ORDER — SODIUM CHLORIDE 0.9% FLUSH: 3.0000 mL | INTRAVENOUS | Status: DC | PRN

## 2015-04-19 MED ORDER — AMLODIPINE BESYLATE 5 MG PO TABS
10.0000 mg | ORAL_TABLET | Freq: Every day | ORAL | Status: DC
  Administered 2015-04-19 – 2015-04-20 (×2): 10 mg via ORAL
  Filled 2015-04-19 (×2): qty 2

## 2015-04-19 MED ORDER — TRIAMTERENE-HCTZ 37.5-25 MG PO CAPS
1.0000 | ORAL_CAPSULE | Freq: Every day | ORAL | Status: DC
  Administered 2015-04-19: 1 via ORAL
  Filled 2015-04-19 (×4): qty 1

## 2015-04-19 MED ORDER — OXYCODONE-ACETAMINOPHEN 7.5-325 MG PO TABS
1.0000 | ORAL_TABLET | Freq: Every day | ORAL | Status: DC | PRN
  Administered 2015-04-20: 1 via ORAL
  Filled 2015-04-19: qty 1

## 2015-04-19 MED ORDER — FENTANYL CITRATE (PF) 100 MCG/2ML IJ SOLN
50.0000 ug | Freq: Once | INTRAMUSCULAR | Status: AC
  Administered 2015-04-19: 50 ug via INTRAVENOUS
  Filled 2015-04-19: qty 2

## 2015-04-19 MED ORDER — ONDANSETRON HCL 4 MG PO TABS: 4.0000 mg | ORAL_TABLET | Freq: Four times a day (QID) | ORAL | Status: DC | PRN

## 2015-04-19 MED ORDER — NICOTINE 14 MG/24HR TD PT24
14.0000 mg | MEDICATED_PATCH | Freq: Every day | TRANSDERMAL | Status: DC
  Administered 2015-04-19 – 2015-04-20 (×2): 14 mg via TRANSDERMAL
  Filled 2015-04-19 (×2): qty 1

## 2015-04-19 MED ORDER — TRAMADOL HCL 50 MG PO TABS: 100.0000 mg | ORAL_TABLET | Freq: Four times a day (QID) | ORAL | Status: DC | PRN

## 2015-04-19 MED ORDER — ONDANSETRON HCL 4 MG/2ML IJ SOLN: 4.0000 mg | Freq: Four times a day (QID) | INTRAMUSCULAR | Status: DC | PRN

## 2015-04-19 MED ORDER — GABAPENTIN 300 MG PO CAPS
300.0000 mg | ORAL_CAPSULE | Freq: Two times a day (BID) | ORAL | Status: DC
  Administered 2015-04-19 – 2015-04-20 (×2): 300 mg via ORAL
  Filled 2015-04-19 (×2): qty 1

## 2015-04-19 MED ORDER — SODIUM CHLORIDE 0.9 % IV BOLUS (SEPSIS)
500.0000 mL | Freq: Once | INTRAVENOUS | Status: AC
  Administered 2015-04-19: 500 mL via INTRAVENOUS

## 2015-04-19 MED ORDER — MORPHINE SULFATE (PF) 2 MG/ML IV SOLN
1.0000 mg | INTRAVENOUS | Status: DC | PRN
  Administered 2015-04-19: 1 mg via INTRAVENOUS
  Filled 2015-04-19: qty 1

## 2015-04-19 MED ORDER — SENNOSIDES-DOCUSATE SODIUM 8.6-50 MG PO TABS: 1.0000 | ORAL_TABLET | Freq: Every evening | ORAL | Status: DC | PRN

## 2015-04-19 MED ORDER — TETRACAINE HCL 0.5 % OP SOLN
OPHTHALMIC | Status: AC
  Filled 2015-04-19: qty 4

## 2015-04-19 MED ORDER — INFLUENZA VAC SPLIT QUAD 0.5 ML IM SUSY: 0.5000 mL | PREFILLED_SYRINGE | INTRAMUSCULAR | Status: DC

## 2015-04-19 MED ORDER — FLUORESCEIN SODIUM 1 MG OP STRP
ORAL_STRIP | OPHTHALMIC | Status: AC
  Filled 2015-04-19: qty 1

## 2015-04-19 MED ORDER — SODIUM CHLORIDE 0.9% FLUSH
3.0000 mL | Freq: Two times a day (BID) | INTRAVENOUS | Status: DC
  Administered 2015-04-19 – 2015-04-20 (×2): 3 mL via INTRAVENOUS

## 2015-04-19 MED ORDER — DOXEPIN HCL 25 MG PO CAPS
50.0000 mg | ORAL_CAPSULE | Freq: Every day | ORAL | Status: DC
  Administered 2015-04-19: 50 mg via ORAL
  Filled 2015-04-19: qty 2
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
## 2015-04-20 DIAGNOSIS — S0230XA Fracture of orbital floor, unspecified side, initial encounter for closed fracture: Secondary | ICD-10-CM | POA: Diagnosis not present

## 2015-04-20 DIAGNOSIS — S0230XD Fracture of orbital floor, unspecified side, subsequent encounter for fracture with routine healing: Secondary | ICD-10-CM | POA: Diagnosis not present

## 2015-04-20 DIAGNOSIS — I6200 Nontraumatic subdural hemorrhage, unspecified: Secondary | ICD-10-CM | POA: Diagnosis not present

## 2015-04-20 LAB — BASIC METABOLIC PANEL
Anion gap: 7 (ref 5–15)
BUN: 11 mg/dL (ref 6–20)
CALCIUM: 8.5 mg/dL — AB (ref 8.9–10.3)
CHLORIDE: 106 mmol/L (ref 101–111)
CO2: 26 mmol/L (ref 22–32)
CREATININE: 0.98 mg/dL (ref 0.61–1.24)
GFR calc Af Amer: >60 mL/min (ref >60)
GFR calc non Af Amer: >60 mL/min (ref >60)
GLUCOSE: 91 mg/dL (ref 65–99)
Potassium: 3.6 mmol/L (ref 3.5–5.1)
Sodium: 139 mmol/L (ref 135–145)

## 2015-04-20 LAB — CBC
HCT: 36.9 % — ABNORMAL LOW (ref 39.0–52.0)
Hemoglobin: 11.6 g/dL — ABNORMAL LOW (ref 13.0–17.0)
MCH: 28.1 pg (ref 26.0–34.0)
MCHC: 31.4 g/dL (ref 30.0–36.0)
MCV: 89.3 fL (ref 78.0–100.0)
PLATELETS: 166 10*3/uL (ref 150–400)
RBC: 4.13 MIL/uL — AB (ref 4.22–5.81)
RDW: 14.4 % (ref 11.5–15.5)
WBC: 6 10*3/uL (ref 4.0–10.5)

## 2015-04-20 MED ORDER — TIZANIDINE HCL 4 MG PO TABS
2.0000 mg | ORAL_TABLET | Freq: Two times a day (BID) | ORAL | Status: DC
  Administered 2015-04-20: 2 mg via ORAL

## 2015-04-20 NOTE — Care Management Obs Status (Signed)
[facility] [facility] NOTIFICATION   Patient Details  Name: Brian [name] MRN: [id number] Date of Birth: 05/14/1945   Medicare Observation Status Notification Given:  Yes    Alvie Heidelberg, RN 04/20/2015, 8:22 AM

## 2015-04-20 NOTE — Progress Notes (Signed)
IV access removed.  Discharge instructions reviewed with patient, questions answered, understanding verbalized.  Waiting transportation home.

## 2015-04-20 NOTE — Progress Notes (Signed)
Patients BP is 153/106, paged on call MD to make them aware, will follow any new orders received and continue to monitor the patient.
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
## 2016-01-21 ENCOUNTER — Ambulatory Visit: Payer: Medicare Other | Attending: Adult Health Nurse Practitioner | Admitting: Physical Therapy

## 2016-01-21 DIAGNOSIS — R2689 Other abnormalities of gait and mobility: Secondary | ICD-10-CM | POA: Insufficient documentation

## 2016-01-21 DIAGNOSIS — I69352 Hemiplegia and hemiparesis following cerebral infarction affecting left dominant side: Secondary | ICD-10-CM

## 2016-01-21 NOTE — Therapy (Signed)
[facility] [address] [place], [place], [address] Phone: [phone]   Fax:  [phone]  Physical Therapy Evaluation  Patient Details  Name: Brian [name] MRN: [id number] Date of Birth: [birth year] No Data Recorded  Encounter Date: 01/21/2016      PT End of Session - 01/21/16 2001    Visit Number 1   Authorization Type Medicare   PT Start Time 1316   PT Stop Time 1431   PT Time Calculation (min) 75 min      Past Medical History:  Diagnosis Date  . Colitis 12/16/2010   Vasculitis  . CVA (cerebral infarction)   . Hypertension   . Renal disorder   . Sepsis(995.91)   . Stroke ([facility])   . Vascular insufficiency     Past Surgical History:  Procedure Laterality Date  . BACK SURGERY  2002/2009  . LAPAROTOMY  12/18/2010   Procedure: EXPLORATORY LAPAROTOMY;  Surgeon: Donato Heinz;  Location: [facility];  Service: General;  Laterality: N/A;  . PARTIAL COLECTOMY  12/18/2010   Procedure: PARTIAL COLECTOMY;  Surgeon: Donato Heinz;  Location: [facility];  Service: General;  Laterality: N/A;  . RADIOLOGY WITH ANESTHESIA N/A 02/20/2014   Procedure: RADIOLOGY WITH ANESTHESIA;  Surgeon: [name], MD;  Location: [facility];  Service: Radiology;  Laterality: N/A;    There were no vitals filed for this visit.       Subjective Assessment - 01/21/16 2000    Subjective Pt presents for wheelchair eval - vendor Tad Moore, ATP from [employer] present   Currently in Pain? No/denies           Mobility/Seating Evaluation    PATIENT INFORMATION: Name: Brian [name] DOB: [birth year]  Sex: M Date seen: 01-21-16 Time: 1315  Address:  [address]                  [place], [place] [address] Physician: Dr. Lars Mage This evaluation/justification form will serve as the LMN for the following suppliers: __________________________ Supplier: [employer] Contact Person: Tad Moore, ATP Phone:   [phone]   Seating Therapist: Guido Sander, PT Phone:   [phone]   Phone: [phone]    Spouse/Parent/Caregiver name: ?????  Phone number: ????? Insurance/Payer: Medicare/Medicaid     Reason for Referral: power wheelchair evaluation  Patient/Caregiver Goals: obtain new power wheelchair  Patient was seen for face-to-face evaluation for new power wheelchair.  Also present was Tad Moore, ATP to discuss recommendations and wheelchair options.  Further paperwork was completed and sent to vendor.  Patient appears to qualify for power mobility device at this time per objective findings.   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 1974: h/o sacral decubitus ulcer stage 2 in 2009:  s/p lumbar laminectomies 2002 & 2009   '[]' Progressive Disease Relevant past and future surgeries: Lumbar laminectomies 2002 and 2009:  partial colectomy 12-18-10: Laparotomy 12-18-10   Height: 5'7" Weight: 148# Explain recent changes or trends in weight: ?????   History including Falls: Pt is using power wheelchair - states he obtained in approximately 2012;  pt reports he has had difficulty walking since approx. 2009; pt reports having had 1 fall in bathroom, approx. 2 yrs ago with no falls since that time:  pt is currently using a RW to amb. 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 Specific requirements pertaining to mobility ?????  Other: ?????    FUNCTIONAL/SENSORY PROCESSING SKILLS:  Handedness:   '[x]' Right     '[]' Left    '[]' NA  Comments:  ?????  Functional Processing Skills for Wheeled Mobility '[x]' Processing Skills are adequate for safe wheelchair operation  Areas of concern than may interfere with safe operation of wheelchair Description of problem   '[]'  Attention to environment      '[]' Judgment      '[]'  Hearing  '[]'  Vision or visual processing      '[]' Motor Planning  '[]'  Fluctuations in Behavior  ?????    VERBAL COMMUNICATION: '[x]' WFL receptive '[x]'  WFL expressive '[]' Understandable  '[]' Difficult to understand  '[]' non-communicative '[]'  Uses an augmented communication device  CURRENT SEATING / MOBILITY: Current Mobility Base:  '[]' None '[]' Dependent '[]' Manual '[]' Scooter '[x]' Power  Type of Control: ?????  Manufacturer:  StreamerSize:  18x 18Age: 6+  Current Condition of Mobility Base:  in disrepair   Current Wheelchair components:  ?????  Describe posture in present seating system:  ?????      SENSATION and SKIN ISSUES: Sensation '[]' Intact  '[x]' Impaired '[]' Absent  Level of sensation: numbness/decreased sensation on L side Pressure Relief: Able to perform effective pressure relief :    '[]' Yes  '[x]'  No Method: ???? If not, Why?: L hemiparesis due to s/p R SAH  Skin Issues/Skin Integrity Current Skin Issues  '[]' Yes '[x]' No '[]' Intact '[]'  Red area'[]'  Open Area  '[x]' Scar Tissue '[x]' At risk from prolonged sitting Where  ?????  History of Skin Issues  '[x]' Yes '[]' No Where  sacral region When  2009  Hx of skin flap surgeries  '[]' Yes '[x]' No Where  ????? When  ?????  Limited sitting tolerance '[x]' Yes '[]' No Hours spent sitting in wheelchair daily: 4 hrs - pt reports he spends most of day lying down because it is more comfortable  Complaint of Pain:  Please describe: None   Swelling/Edema: None   ADL STATUS (in reference to wheelchair  use):  Indep Assist Unable Indep with Equip Not assessed Comments  Dressing ????? X ????? ????? ????? needs assistance with lower body dressing and with coat; performs from bed  Eating X ????? ????? ????? ????? ?????  Toileting ????? ????? ????? X ????? ?????  Bathing ????? X ????? ????? ????? uses tub bench; CNA assists with transfers: occasionally does sponge baths  Grooming/Hygiene ????? ????? ?????  X ????? holds onto sink to assist with standing balance  Meal Prep ????? X ????? X ????? CNA assists/performs; pt able to perform light meal prep  IADLS ????? X ????? X ????? CAN assists  Bowel Management: '[x]' Continent  '[]' Incontinent  '[]' Accidents Comments:  ?????  Bladder Management: '[]' Continent  '[]' Incontinent  '[x]' Accidents Comments:  ?????     WHEELCHAIR SKILLS: Manual w/c Propulsion: '[]' UE or LE strength and endurance sufficient to participate in ADLs using manual wheelchair Arm : '[]' left '[]' right   '[]' Both      Distance: ????? Foot:  '[]' left '[]' right   '[]' Both  Operate Scooter: '[]'  Strength, hand grip, balance and transfer appropriate for use '[]' Living environment is accessible for use of scooter  Operate Power w/c:  '[x]'  Std. Joystick   '[]'  Alternative Controls Indep '[]'  Assist '[]'  Dependent/unable '[]'  N/A '[]'   '[]' Safe          '[]'  Functional      Distance: ?????  Bed confined without wheelchair '[x]'  Yes '[]'  No   STRENGTH/RANGE OF MOTION:  Active Range of Motion Strength  Shoulder L shoulder flexion to approx. 90 degrees with spasticity  R shoulder AROM WFL's LUE grossly 2 - 2+/5 RUE grossly 4/5  Elbow RUE WFL's L elbow flexion WFL's:  L elbow extension -30 degrees LUE 3+/5 RUE 5/5  Wrist/Hand very minimal active L wrist flexion/extension with spasticity;  R wrist/hand WFL's LUE 2-/5 RUE 5/5  Hip minimal L hip AROM due to hemiparesis RLE WFL's for hip AROM LLE 2-/5 RLE 4/5  Knee L knee extension approx. -35 degrees from neutral RLE WFL's for flexion/extension LLE 2+ - 3-/5 RLE 5/5  Ankle  RLE WFL's; LLE minimal AROM in ankle LLE 2-/5 RLE 5/5     MOBILITY/BALANCE:  '[]'  Patient is totally dependent for mobility  ?????    Balance Transfers Ambulation  Sitting Balance: Standing Balance: '[]'  Independent '[]'  Independent/Modified Independent  '[x]'  WFL     '[]'  WFL '[]'  Supervision '[]'  Supervision  '[]'  Uses UE for balance  '[]'  Supervision '[x]'  Min Assist '[x]'  Ambulates with Assist  mod assist    '[]'  Min Assist '[x]'  Min assist '[x]'  Mod Assist '[x]'  Ambulates with Device:      '[x]'  RW  '[]'  StW  '[]'  Cane  '[]'  10'  '[]'  Mod Assist '[]'  Mod assist '[]'  Max assist   '[]'  Max Assist '[]'  Max assist '[]'  Dependent '[]'  Indep. Short Distance Only  '[]'  Unable '[]'  Unable '[]'  Lift / Sling Required Distance (in feet)  ?????   '[]'  Sliding board '[]'  Unable to Ambulate (see explanation below)  Cardio Status:  '[x]' Intact  '[]'  Impaired   '[]'  NA     ?????  Respiratory Status:  '[x]' Intact   '[]' Impaired   '[]' NA     ?????  Orthotics/Prosthetics: ?????  Comments (Address manual vs power w/c vs scooter): Pt is unable to amb. functionally due to L hemiparesis due to s/p CVA's x 2; pt has increased spasticity in LLE and in LUE with ataxia.  Pt is unable to functionally propel a manual wheelchair due to L hemiparesis. Pt is unable to use/operate a scooter due to space limitations in the home with increased turning radius required and due to decreased strength/functional use and decreased AROM LUE.          Anterior / Posterior Obliquity Rotation-Pelvis ?????  PELVIS    '[]'  '[x]'  '[]'   Neutral Posterior Anterior  '[x]'  '[]'  '[]'   WFL Rt elev Lt elev  '[]'  '[x]'  '[]'   WFL Right Left                      Anterior    Anterior     '[]'  Fixed '[]'  Other '[x]'  Partly Flexible '[]'  Flexible   '[]'  Fixed '[]'  Other '[x]'  Partly Flexible  '[]'  Flexible  '[]'  Fixed '[]'  Other '[x]'  Partly Flexible  '[]'  Flexible   TRUNK  '[]'  '[x]'  '[]'   WFL ? Thoracic ? Lumbar  Kyphosis Lordosis  '[x]'  '[]'  '[]'   WFL Convex Convex  Right Left '[]' c-curve '[]' s-curve '[]' multiple  '[x]'  Neutral '[]'  Left-anterior '[]'   Right-anterior     '[]'  Fixed '[]'  Flexible '[x]'  Partly Flexible '[]'  Other  '[]'  Fixed '[]'  Flexible '[x]'  Partly Flexible '[]'  Other  '[]'  Fixed             '[]'  Flexible '[x]'  Partly Flexible '[]'  Other    Position Windswept  ?????  HIPS          '[x]'            '[]'               '[]'    Neutral       Abduct        ADduct         '[x]'           '[]'            '[]'   Neutral Right           Left      '[]'  Fixed '[]'  Subluxed '[x]'  Partly Flexible '[]'  Dislocated '[]'  Flexible  '[]'  Fixed '[]'  Other '[x]'  Partly Flexible  '[]'  Flexible                 Foot Positioning Knee Positioning  RLE is longer than LLE (noted in seated position)    '[x]'  WFL  '[x]' Lt '[x]' Rt '[x]'  WFL  '[x]' Lt '[x]' Rt    KNEES ROM concerns: ROM concerns:    & Dorsi-Flexed '[]' Lt '[]' Rt ?????    FEET Plantar Flexed '[]' Lt '[]' Rt      Inversion                 '[]' Lt '[]' Rt      Eversion                 '[]' Lt '[]' Rt     HEAD '[x]'  Functional '[x]'  Good Head Control  ?????  & '[]'  Flexed         '[]'  Extended '[]'  Adequate Head Control    NECK '[]'  Rotated  Lt  '[]'  Lat Flexed Lt '[]'  Rotated  Rt '[]'  Lat Flexed Rt '[]'  Limited Head Control     '[]'  Cervical Hyperextension '[]'  Absent  Head Control     SHOULDERS ELBOWS WRIST& HAND ?????      Left     Right    Left     Right    Left     Right   U/E '[]' Functional           '[x]' Functional ????? ????? '[]' Fisting             '[]' Fisting      '[x]' elev   '[]' dep      '[]' elev   '[]' dep       '[x]' pro -'[]' retract     '[]' pro  '[]' retract '[]' subluxed             '[]' subluxed           Goals for Wheelchair Mobility  '[x]'  Independence with mobility in  the home with motor related ADLs (MRADLs)  '[]'  Independence with MRADLs in the community '[]'  Provide dependent mobility  '[x]'  Provide recline     '[x]' Provide tilt   Goals for Seating system '[x]'  Optimize pressure distribution '[x]'  Provide support needed to facilitate function or safety '[x]'  Provide corrective forces to assist with maintaining or improving posture '[]'  Accommodate client's posture:   current seated postures and positions  are not flexible or will not tolerate corrective forces '[x]'  Client to be independent with relieving pressure in the wheelchair '[x]' Enhance physiological function such as breathing, swallowing, digestion  Simulation ideas/Equipment trials:????? State why other equipment was unsuccessful:?????   MOBILITY BASE RECOMMENDATIONS and JUSTIFICATION: MOBILITY COMPONENT JUSTIFICATION  Manufacturer: [employer]Model: Pulse 6   Size: Width 18Seat Depth 18 '[x]' provide transport from point A to B      '[x]' promote Indep mobility  '[x]' is not a safe, functional ambulator '[x]' walker or cane inadequate '[]' non-standard width/depth necessary to accommodate anatomical measurement '[]'  ?????  '[]' Manual Mobility Base '[]' non-functional ambulator    '[]' Scooter/POV  '[]' can safely operate  '[]' can safely transfer   '[]' has adequate trunk stability  '[]' cannot functionally propel manual w/c  '[x]' Power Mobility Base  '[x]' non-ambulatory  '[x]' cannot functionally propel manual wheelchair  '[x]'  cannot functionally and safely operate scooter/POV '[x]' can safely operate and willing to  '[]' Stroller Base '[]' infant/child  '[]' unable to propel manual wheelchair '[]' allows for growth '[]' non-functional ambulator '[]' non-functional UE '[]' Indep mobility is not a goal at this time  '[x]' Tilt  '[]' Forward '[x]' Backward '[x]' Powered tilt  '[]' Manual tilt  '[x]' change position against gravitational force on head and shoulders  '[x]' change position for pressure relief/cannot weight shift '[]' transfers  '[x]' management of tone '[x]' rest periods '[]' control edema '[]' facilitate postural control  '[]'  ?????  '[x]' Recline  '[x]' Power recline on power base '[]' Manual recline on manual base  '[]' accommodate femur to back angle  '[]' bring to full recline for ADL care  '[x]' change position for pressure relief/cannot weight shift '[x]' rest periods '[]' repositioning for transfers or clothing/diaper /catheter changes '[]' head positioning  '[]' Lighter weight required '[]' self- propulsion  '[]' lifting '[]'  ?????   '[]' Heavy Duty required '[]' user weight greater than 250# '[]' extreme tone/ over active movement '[]' broken frame on previous chair '[]'  ?????  '[x]'  Back  '[]'  Angle Adjustable '[]'  Custom molded Positioning '[x]' postural control '[]' control of tone/spasticity '[]' accommodation of range of motion '[]' UE functional control '[]' accommodation for seating system '[]'  ????? '[]' provide lateral trunk support '[]' accommodate deformity '[x]' provide posterior trunk support '[x]' provide lumbar/sacral support '[x]' support trunk in midline '[x]' Pressure relief over spinal processes  '[x]'  Seat Cushion Positioning & skin protection '[x]' impaired sensation  '[]' decubitus ulcers present '[x]' history of pressure ulceration '[]' prevent pelvic extension '[x]' low maintenance  '[]' stabilize pelvis  '[]' accommodate obliquity '[]' accommodate multiple deformity '[x]' neutralize lower extremity position '[x]' increase pressure distribution '[]'  ?????  '[]'  Pelvic/thigh support  '[]'  Lateral thigh guide '[]'  Distal medial pad  '[]'  Distal lateral pad '[]'  pelvis in neutral '[]' accommodate pelvis '[]'  position upper legs '[]'  alignment '[]'  accommodate ROM '[]'  decr adduction '[]' accommodate tone '[]' removable for transfers '[]' decr abduction  '[]'  Lateral trunk Supports '[]'  Lt     '[]'  Rt '[]' decrease lateral trunk leaning '[]' control tone '[]' contour for increased contact '[]' safety  '[]' accommodate asymmetry '[]'  ?????  '[x]'  Mounting hardware  '[]' lateral trunk supports  '[]' back   '[]' seat '[x]' headrest      '[]'  thigh support '[]' fixed   '[]' swing away '[]' attach seat platform/cushion to w/c frame '[]' attach back cushion to w/c frame '[]' mount postural supports '[x]' mount headrest  '[]' swing medial thigh support away '[]' swing lateral supports away for transfers  '[]'  ?????    Armrests  '[]' fixed [  x]adjustable height '[]' removable   '[]' swing away  '[x]' flip back   '[]' reclining '[x]' full length pads '[x]' desk    '[]' pads tubular  '[x]' provide support with elbow at 90   '[]' provide support for w/c tray '[x]' change of height/angles  for variable activities '[]' remove for transfers '[x]' allow to come closer to table top '[]' remove for access to tables '[x]'  desk length on R: full on L  Hangers/ Leg rests  '[]' 60 '[]' 70 '[]' 90 '[x]' elevating '[]' heavy duty  '[]' articulating '[]' fixed '[]' lift off '[]' swing away     '[x]' power '[x]' provide LE support  '[]' accommodate to hamstring tightness '[x]' elevate legs during recline   '[x]' provide change in position for Legs '[]' Maintain placement of feet on footplate '[]' durability '[]' enable transfers '[]' decrease edema '[]' Accommodate lower leg length '[]'  ?????  Foot support Footplate    '[]' Lt  '[]'  Rt  '[x]'  Center mount '[x]' flip up     '[]' depth/angle adjustable '[]' Amputee adapter    '[]'  Lt     '[]'  Rt '[x]' provide foot support '[]' accommodate to ankle ROM '[x]' transfers '[]' Provide support for residual extremity '[]'  allow foot to go under wheelchair base '[]'  decrease tone  '[]'  ?????  '[]'  Ankle strap/heel loops '[]' support foot on foot support '[]' decrease extraneous movement '[]' provide input to heel  '[]' protect foot  Tires: '[]' pneumatic  '[x]' flat free inserts  '[]' solid  '[x]' decrease maintenance  '[x]' prevent frequent flats '[]' increase shock absorbency '[]' decrease pain from road shock '[]' decrease spasms from road shock '[]'  ?????  '[x]'  Headrest  '[x]' provide posterior head support '[]' provide posterior neck support '[]' provide lateral head support '[]' provide anterior head support '[x]' support during tilt and recline '[]' improve feeding   '[]' improve respiration '[]' placement of switches '[]' safety  '[]' accommodate ROM  '[]' accommodate tone '[]' improve visual orientation  '[]'  Anterior chest strap '[]'  Vest '[]'  Shoulder retractors  '[]' decrease forward movement of shoulder '[]' accommodation of TLSO '[]' decrease forward movement of trunk '[]' decrease shoulder elevation '[]' added abdominal support '[]' alignment '[]' assistance with shoulder control  '[]'  ?????  Pelvic Positioner '[x]' Belt '[]' SubASIS bar '[]' Dual Pull '[]' stabilize tone '[x]' decrease falling out of chair/ **will not Decr  potential for sliding due to pelvic tilting '[]' prevent excessive rotation '[]' pad for protection over boney prominence '[]' prominence comfort '[]' special pull angle to control rotation '[]'  ?????  Upper Extremity Support '[]' L   '[]'  R '[]' Arm trough    '[]' hand support '[]'  tray       '[]' full tray '[]' swivel mount '[]' decrease edema      '[]' decrease subluxation   '[]' control tone   '[]' placement for AAC/Computer/EADL '[]' decrease gravitational pull on shoulders '[]' provide midline positioning '[]' provide support to increase UE function '[]' provide hand support in natural position '[]' provide work surface   POWER WHEELCHAIR CONTROLS  '[x]' Proportional  '[]' Non-Proportional Type joystick '[]' Left  '[x]' Right '[x]' provides access for controlling wheelchair   '[]' lacks motor control to operate proportional drive control '[]' unable to understand proportional controls  Actuator Control Module  '[]' Single  '[x]' Multiple   '[x]' Allow the client to operate the power seat function(s) through the joystick control   '[]' Safety Reset Switches '[]' Used to change modes and stop the wheelchair when driving in latch mode    '[x]' Upgraded Electronics   '[x]' programming for accurate control '[]' progressive Disease/changing condition '[]' non-proportional drive control needed '[x]' Needed in order to operate power seat functions through joystick control   '[]' Display box '[]' Allows user to see in which mode and drive the wheelchair is set  '[]' necessary for alternate controls    '[]' Digital interface electronics '[]' Allows w/c to operate when using alternative drive controls  '[]' ASL Head Array '[]' Allows client to operate wheelchair  through switches placed in tri-panel headrest  '[]' Sip and puff with tubing kit '[]'   needed to operate sip and puff drive controls  '[]' Upgraded tracking electronics '[]' increase safety when driving '[]' correct tracking when on uneven surfaces  '[x]' Mount for switches or joystick '[]' Attaches switches to w/c  '[x]' Swing away for access or transfers '[]' midline for optimal  placement '[]' provides for consistent access  '[]' Attendant controlled joystick plus mount '[]' safety '[]' long distance driving '[]' operation of seat functions '[]' compliance with transportation regulations '[]'  ?????    Rear wheel placement/Axle adjustability '[]' None '[]' semi adjustable '[]' fully adjustable  '[]' improved UE access to wheels '[]' improved stability '[]' changing angle in space for improvement of postural stability '[]' 1-arm drive access '[]' amputee pad placement '[]'  ?????  Wheel rims/ hand rims  '[]' metal  '[]' plastic coated '[]' oblique projections '[]' vertical projections '[]' Provide ability to propel manual wheelchair  '[]'  Increase self-propulsion with hand weakness/decreased grasp  Push handles '[]' extended  '[]' angle adjustable  '[]' standard '[]' caregiver access '[]' caregiver assist '[]' allows "hooking" to enable increased ability to perform ADLs or maintain balance  One armed device  '[]' Lt   '[]' Rt '[]' enable propulsion of manual wheelchair with one arm   '[]'  ?????   Brake/wheel lock extension '[]'  Lt   '[]'  Rt '[]' increase indep in applying wheel locks   '[]' Side guards '[]' prevent clothing getting caught in wheel or becoming soiled '[]'  prevent skin tears/abrasions  Battery: pair of 22 NF's '[x]' to power wheelchair ?????  Other: ????? ????? ?????  The above equipment has a life- long use expectancy. Growth and changes in medical and/or functional conditions would be the exceptions. This is to certify that the therapist has no financial relationship with durable medical provider or manufacturer. The therapist will not receive remuneration of any kind for the equipment recommended in this evaluation.   Patient has mobility limitation that significantly impairs safe, timely participation in one or more mobility related ADL's.  (bathing, toileting, feeding, dressing, grooming, moving from room to room)                                                             '[x]'  Yes '[]'  No Will mobility device sufficiently improve ability to participate  and/or be aided in participation of MRADL's?         '[x]'  Yes '[]'  No Can limitation be compensated for with use of a cane or walker?                                                                                '[]'  Yes '[x]'  No Does patient or caregiver demonstrate ability/potential ability & willingness to safely use the mobility device?   '[x]'  Yes '[]'  No Does patient's home environment support use of recommended mobility device?                                                    '[x]'  Yes '[]'  No Does patient have sufficient upper extremity function necessary to functionally propel a manual wheelchair?    '[]'   Yes '[x]'  No Does patient have sufficient strength and trunk stability to safely operate a POV (scooter)?                                  '[]'  Yes '[x]'  No Does patient need additional features/benefits provided by a power wheelchair for MRADL's in the home?       '[x]'  Yes '[]'  No Does the patient demonstrate the ability to safely use a power wheelchair?                                                              '[x]'  Yes '[]'  No  Therapist Name Printed: Guido Sander, PT Date: 01-21-16  Therapist's Signature:   Date:   Supplier's Name Printed: Tad Moore, [employer] Date: 01-21-16  Supplier's Signature:   Date:  Patient/Caregiver Signature:   Date:     This is to certify that I have read this evaluation and do agree with the content within:      Physician's Name Printed: Dr. Lars Mage  Physician's Signature:  Date:     This is to certify that I, the above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

## 2018-04-16 DIAGNOSIS — J449 Chronic obstructive pulmonary disease, unspecified: Secondary | ICD-10-CM | POA: Diagnosis not present

## 2018-04-23 DIAGNOSIS — J449 Chronic obstructive pulmonary disease, unspecified: Secondary | ICD-10-CM | POA: Diagnosis not present

## 2018-05-14 DIAGNOSIS — J449 Chronic obstructive pulmonary disease, unspecified: Secondary | ICD-10-CM | POA: Diagnosis not present

## 2018-06-28 ENCOUNTER — Encounter (HOSPITAL_COMMUNITY): Payer: Self-pay | Admitting: Emergency Medicine

## 2018-06-28 ENCOUNTER — Other Ambulatory Visit: Payer: Self-pay

## 2018-06-28 ENCOUNTER — Emergency Department (HOSPITAL_COMMUNITY): Payer: Medicare HMO

## 2018-06-28 ENCOUNTER — Observation Stay (HOSPITAL_COMMUNITY)
Admission: EM | Admit: 2018-06-28 | Discharge: 2018-06-29 | Disposition: A | Discharge status: Home / Self Care | Payer: Medicare HMO | Attending: Internal Medicine | Admitting: Internal Medicine

## 2018-06-28 DIAGNOSIS — Z1159 Encounter for screening for other viral diseases: Secondary | ICD-10-CM | POA: Diagnosis not present

## 2018-06-28 DIAGNOSIS — I609 Nontraumatic subarachnoid hemorrhage, unspecified: Secondary | ICD-10-CM | POA: Insufficient documentation

## 2018-06-28 DIAGNOSIS — R4020 Unspecified coma: Secondary | ICD-10-CM | POA: Diagnosis not present

## 2018-06-28 DIAGNOSIS — G459 Transient cerebral ischemic attack, unspecified: Secondary | ICD-10-CM | POA: Diagnosis not present

## 2018-06-28 DIAGNOSIS — R531 Weakness: Secondary | ICD-10-CM | POA: Diagnosis present

## 2018-06-28 DIAGNOSIS — R464 Slowness and poor responsiveness: Principal | ICD-10-CM | POA: Insufficient documentation

## 2018-06-28 DIAGNOSIS — R4189 Other symptoms and signs involving cognitive functions and awareness: Secondary | ICD-10-CM | POA: Diagnosis present

## 2018-06-28 DIAGNOSIS — Z9181 History of falling: Secondary | ICD-10-CM | POA: Insufficient documentation

## 2018-06-28 DIAGNOSIS — R4182 Altered mental status, unspecified: Secondary | ICD-10-CM

## 2018-06-28 DIAGNOSIS — F1410 Cocaine abuse, uncomplicated: Secondary | ICD-10-CM

## 2018-06-28 DIAGNOSIS — I720 Aneurysm of carotid artery: Secondary | ICD-10-CM | POA: Diagnosis not present

## 2018-06-28 DIAGNOSIS — I959 Hypotension, unspecified: Secondary | ICD-10-CM | POA: Diagnosis not present

## 2018-06-28 DIAGNOSIS — I10 Essential (primary) hypertension: Secondary | ICD-10-CM

## 2018-06-28 DIAGNOSIS — R000 Tachycardia, unspecified: Secondary | ICD-10-CM | POA: Diagnosis not present

## 2018-06-28 DIAGNOSIS — I639 Cerebral infarction, unspecified: Secondary | ICD-10-CM | POA: Diagnosis not present

## 2018-06-28 DIAGNOSIS — R404 Transient alteration of awareness: Secondary | ICD-10-CM | POA: Diagnosis not present

## 2018-06-28 HISTORY — DX: Ileus, unspecified: K56.7

## 2018-06-28 HISTORY — DX: Cerebral infarction, unspecified: I63.9

## 2018-06-28 HISTORY — DX: Altered mental status, unspecified: R41.82

## 2018-06-28 HISTORY — DX: Acute embolism and thrombosis of unspecified deep veins of unspecified lower extremity: I82.409

## 2018-06-28 HISTORY — DX: Sepsis, unspecified organism: A41.9

## 2018-06-28 HISTORY — DX: Disorder of kidney and ureter, unspecified: N28.9

## 2018-06-28 HISTORY — DX: Essential (primary) hypertension: I10

## 2018-06-28 LAB — RAPID URINE DRUG SCREEN, HOSP PERFORMED
Amphetamines: NOT DETECTED
Barbiturates: NOT DETECTED
Benzodiazepines: POSITIVE — AB
Cocaine: POSITIVE — AB
Opiates: NOT DETECTED
Tetrahydrocannabinol: POSITIVE — AB

## 2018-06-28 LAB — CBC
HCT: 47.7 % (ref 39.0–52.0)
Hemoglobin: 14.7 g/dL (ref 13.0–17.0)
MCH: 28.4 pg (ref 26.0–34.0)
MCHC: 30.8 g/dL (ref 30.0–36.0)
MCV: 92.1 fL (ref 80.0–100.0)
Platelets: 164 10*3/uL (ref 150–400)
RBC: 5.18 MIL/uL (ref 4.22–5.81)
RDW: 13.2 % (ref 11.5–15.5)
WBC: 5 10*3/uL (ref 4.0–10.5)
nRBC: 0 % (ref 0.0–0.2)

## 2018-06-28 LAB — LACTIC ACID, PLASMA
Lactic Acid, Venous: 1.2 mmol/L (ref 0.5–1.9)
Lactic Acid, Venous: 1.4 mmol/L (ref 0.5–1.9)

## 2018-06-28 LAB — URINALYSIS, ROUTINE W REFLEX MICROSCOPIC
Bacteria, UA: NONE SEEN
Bilirubin Urine: NEGATIVE
Glucose, UA: NEGATIVE mg/dL
Hgb urine dipstick: NEGATIVE
Ketones, ur: NEGATIVE mg/dL
Leukocytes,Ua: NEGATIVE
Nitrite: NEGATIVE
Protein, ur: 30 mg/dL — AB
Specific Gravity, Urine: 1.019 (ref 1.005–1.030)
pH: 6 (ref 5.0–8.0)

## 2018-06-28 LAB — COMPREHENSIVE METABOLIC PANEL
ALT: 15 U/L (ref 0–44)
AST: 23 U/L (ref 15–41)
Albumin: 3.6 g/dL (ref 3.5–5.0)
Alkaline Phosphatase: 67 U/L (ref 38–126)
Anion gap: 10 (ref 5–15)
BUN: 16 mg/dL (ref 8–23)
CO2: 21 mmol/L — ABNORMAL LOW (ref 22–32)
Calcium: 8.8 mg/dL — ABNORMAL LOW (ref 8.9–10.3)
Chloride: 106 mmol/L (ref 98–111)
Creatinine, Ser: 1.04 mg/dL (ref 0.61–1.24)
GFR calc Af Amer: >60 mL/min (ref >60)
GFR calc non Af Amer: >60 mL/min (ref >60)
Glucose, Bld: 93 mg/dL (ref 70–99)
Potassium: 4.1 mmol/L (ref 3.5–5.1)
Sodium: 137 mmol/L (ref 135–145)
Total Bilirubin: 0.6 mg/dL (ref 0.3–1.2)
Total Protein: 8.7 g/dL — ABNORMAL HIGH (ref 6.5–8.1)

## 2018-06-28 LAB — CBG MONITORING, ED: Glucose-Capillary: 91 mg/dL (ref 70–99)

## 2018-06-28 LAB — DIFFERENTIAL
Abs Immature Granulocytes: 0.01 10*3/uL (ref 0.00–0.07)
Basophils Absolute: 0 10*3/uL (ref 0.0–0.1)
Basophils Relative: 1 %
Eosinophils Absolute: 0.1 10*3/uL (ref 0.0–0.5)
Eosinophils Relative: 2 %
Immature Granulocytes: 0 %
Lymphocytes Relative: 26 %
Lymphs Abs: 1.3 10*3/uL (ref 0.7–4.0)
Monocytes Absolute: 0.5 10*3/uL (ref 0.1–1.0)
Monocytes Relative: 9 %
Neutro Abs: 3.1 10*3/uL (ref 1.7–7.7)
Neutrophils Relative %: 62 %

## 2018-06-28 LAB — SARS CORONAVIRUS 2 BY RT PCR (HOSPITAL ORDER, PERFORMED IN ~~LOC~~ HOSPITAL LAB): SARS Coronavirus 2: NEGATIVE

## 2018-06-28 LAB — TROPONIN I
Troponin I: <0.03 ng/mL (ref <0.03)
Troponin I: <0.03 ng/mL (ref <0.03)

## 2018-06-28 LAB — I-STAT CREATININE, ED: Creatinine, Ser: 1 mg/dL (ref 0.61–1.24)

## 2018-06-28 LAB — PROTIME-INR
INR: 1.1 (ref 0.8–1.2)
Prothrombin Time: 14.1 seconds (ref 11.4–15.2)

## 2018-06-28 LAB — ETHANOL: Alcohol, Ethyl (B): <10 mg/dL (ref <10)

## 2018-06-28 LAB — APTT: aPTT: 31 seconds (ref 24–36)

## 2018-06-28 MED ORDER — ATORVASTATIN CALCIUM 40 MG PO TABS
40.0000 mg | ORAL_TABLET | Freq: Every day | ORAL | Status: DC
  Administered 2018-06-28: 40 mg via ORAL
  Filled 2018-06-28: qty 1

## 2018-06-28 MED ORDER — ONDANSETRON HCL 4 MG/2ML IJ SOLN: 4.0000 mg | Freq: Four times a day (QID) | INTRAMUSCULAR | Status: DC | PRN

## 2018-06-28 MED ORDER — LORAZEPAM 2 MG/ML IJ SOLN: 1.0000 mg | INTRAMUSCULAR | Status: DC | PRN

## 2018-06-28 MED ORDER — ONDANSETRON HCL 4 MG PO TABS: 4.0000 mg | ORAL_TABLET | Freq: Four times a day (QID) | ORAL | Status: DC | PRN

## 2018-06-28 MED ORDER — ASPIRIN EC 81 MG PO TBEC
81.0000 mg | DELAYED_RELEASE_TABLET | Freq: Every day | ORAL | Status: DC
  Administered 2018-06-29: 81 mg via ORAL
  Filled 2018-06-28: qty 1

## 2018-06-28 MED ORDER — ACETAMINOPHEN 325 MG PO TABS: 650.0000 mg | ORAL_TABLET | Freq: Four times a day (QID) | ORAL | Status: DC | PRN

## 2018-06-28 MED ORDER — ACETAMINOPHEN 650 MG RE SUPP: 650.0000 mg | Freq: Four times a day (QID) | RECTAL | Status: DC | PRN

## 2018-06-28 MED ORDER — HEPARIN SODIUM (PORCINE) 5000 UNIT/ML IJ SOLN
5000.0000 [IU] | Freq: Three times a day (TID) | INTRAMUSCULAR | Status: DC
  Administered 2018-06-28 – 2018-06-29 (×2): 5000 [IU] via SUBCUTANEOUS
  Filled 2018-06-28 (×3): qty 1

## 2018-06-28 MED ORDER — POLYETHYLENE GLYCOL 3350 17 G PO PACK: 17.0000 g | PACK | Freq: Every day | ORAL | Status: DC | PRN

## 2018-06-28 MED ORDER — LABETALOL HCL 5 MG/ML IV SOLN: 10.0000 mg | INTRAVENOUS | Status: DC | PRN

## 2018-06-28 MED ORDER — ASPIRIN 325 MG PO TABS
325.0000 mg | ORAL_TABLET | Freq: Once | ORAL | Status: AC
  Administered 2018-06-28: 325 mg via ORAL
  Filled 2018-06-28: qty 1

## 2018-06-28 NOTE — ED Notes (Signed)
Report given to Rachel RN

## 2018-06-28 NOTE — ED Notes (Signed)
CODE STROKE PAGED [id number]

## 2018-06-28 NOTE — ED Provider Notes (Signed)
[facility] EMERGENCY DEPARTMENT Provider Note   CSN: [id number] Arrival date & time: 06/28/18  1529    History   Chief Complaint Chief Complaint  Patient presents with   Weakness    HPI Alhaji Mcneal is a 73 y.o. male.  He reportedly has a prior history of stroke.  Patient states that left him with some left-sided deficit.  History is primarily provided initially by EMS.  They were called to his house by the caregiver for patient is less responsive.  Last known well 3 hours ago.  Reportedly at baseline is ambulatory.  He was noted by EMS to be unresponsive but became more verbal during transport.  He was initially hypotensive in the 80s but improved with IV fluids.  On arrival here the patient has no complaints and is able to answer some questions.  He denies any headache double vision chest pain shortness of breath.  He said he fell.  That was not reported to EMS by the caregiver.  There was no reported seizure activity.  Due to the reported left-sided weakness that EMS appreciated to code stroke was activated.     The history is provided by the patient and the EMS personnel.  Weakness  Severity:  Moderate Onset quality:  Unable to specify Duration:  3 hours Timing:  Constant Progression:  Improving Relieved by:  None tried Worsened by:  Nothing Associated symptoms: difficulty walking, falls, loss of consciousness and sensory-motor deficit   Associated symptoms: no abdominal pain, no chest pain, no cough and no fever   Risk factors: neurologic disease     History reviewed. No pertinent past medical history.  There are no active problems to display for this patient.   History reviewed. No pertinent surgical history.      Home Medications    Prior to Admission medications   Not on File    Family History No family history on file.  Social History Social History   Tobacco Use   Smoking status: Not on file  Substance Use Topics   Alcohol use: Not on file    Drug use: Not on file     Allergies   Patient has no allergy information on record.   Review of Systems Review of Systems  Unable to perform ROS: Mental status change  Constitutional: Negative for fever.  Respiratory: Negative for cough.   Cardiovascular: Negative for chest pain.  Gastrointestinal: Negative for abdominal pain.  Musculoskeletal: Positive for falls.  Neurological: Positive for loss of consciousness and weakness.     Physical Exam Updated Vital Signs BP 126/89 (BP Location: Left Arm)    Pulse 78    Temp 98.1 F (36.7 C) (Oral)    Resp 16    Ht 5' 7.5" (1.715 m)    Wt 68 kg    SpO2 100%    BMI 23.15 kg/m   Physical Exam Vitals signs and nursing note reviewed.  Constitutional:      Appearance: He is well-developed.  HENT:     Head: Normocephalic and atraumatic.  Eyes:     Conjunctiva/sclera: Conjunctivae normal.  Neck:     Musculoskeletal: Neck supple.  Cardiovascular:     Rate and Rhythm: Normal rate and regular rhythm.     Heart sounds: No murmur.  Pulmonary:     Effort: Pulmonary effort is normal. No respiratory distress.     Breath sounds: Normal breath sounds.  Abdominal:     Palpations: Abdomen is soft.     Tenderness:  There is no abdominal tenderness.  Musculoskeletal: Normal range of motion.        General: No deformity or signs of injury.  Skin:    General: Skin is warm and dry.     Capillary Refill: Capillary refill takes less than 2 seconds.  Neurological:     Mental Status: He is alert.     Comments: Initially unarousable but responded to voice and was answering questions when I talk with him.  He seems somewhat weak left face left arm left leg which he says is baseline.  He is able to name and read and his visual fields are intact.      ED Treatments / Results  Labs (all labs ordered are listed, but only abnormal results are displayed) Labs Reviewed  COMPREHENSIVE METABOLIC PANEL - Abnormal; Notable for the following components:       Result Value   CO2 21 (*)    Calcium 8.8 (*)    Total Protein 8.7 (*)    All other components within normal limits  RAPID URINE DRUG SCREEN, HOSP PERFORMED - Abnormal; Notable for the following components:   Cocaine POSITIVE (*)    Benzodiazepines POSITIVE (*)    Tetrahydrocannabinol POSITIVE (*)    All other components within normal limits  URINALYSIS, ROUTINE W REFLEX MICROSCOPIC - Abnormal; Notable for the following components:   Protein, ur 30 (*)    All other components within normal limits  LIPID PANEL - Abnormal; Notable for the following components:   HDL 27 (*)    All other components within normal limits  CULTURE, BLOOD (ROUTINE X 2)  CULTURE, BLOOD (ROUTINE X 2)  SARS CORONAVIRUS 2 (HOSPITAL ORDER, [facility])  ETHANOL  PROTIME-INR  APTT  CBC  DIFFERENTIAL  LACTIC ACID, PLASMA  LACTIC ACID, PLASMA  TROPONIN I  TROPONIN I  TROPONIN I  MAGNESIUM  HEMOGLOBIN A1C  I-STAT CREATININE, ED  CBG MONITORING, ED    EKG EKG Interpretation  Date/Time:  Thursday Jun 28 2018 18:15:37 EDT Ventricular Rate:  90 PR Interval:    QRS Duration: 83 QT Interval:  365 QTC Calculation: 447 R Axis:   11 Text Interpretation:  Sinus rhythm Abnormal R-wave progression, early transition Probable left ventricular hypertrophy Confirmed by Orpah Greek [phone]) on 06/29/2018 7:11:15 AM   Radiology Mr Jodene Nam Head Wo Contrast  Result Date: 06/29/2018 CLINICAL DATA:  Found unresponsive. History of CVA. LEFT-sided weakness. EXAM: MRI HEAD WITHOUT CONTRAST MRA HEAD WITHOUT CONTRAST TECHNIQUE: Multiplanar, multiecho pulse sequences of the brain and surrounding structures were obtained without intravenous contrast. Angiographic images of the head were obtained using MRA technique without contrast. COMPARISON:  Code stroke CT 06/28/2018. 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 The patient was unable to remain motionless for the exam. Small or subtle lesions could be overlooked. MRI HEAD FINDINGS Brain: No acute infarction, hemorrhage, hydrocephalus, extra-axial collection or mass lesion. Generalized atrophy. Moderate to advanced T2 and FLAIR hyperintensities through the white matter, likely small vessel disease. Large remote RIGHT MCA territory infarct, encephalomalacia and gliosis is stable from priors. No peri-infarct extension to correlate with the history of LEFT-sided weakness. Vascular: Described separately. Skull and upper cervical spine: Normal marrow signal. Sinuses/Orbits: Negative. Other: None. MRA HEAD FINDINGS Stairstep artifact, in conjunction with patient motion, creates artifactual stenoses due to misregistration. The internal carotid arteries appear widely patent. The basilar artery appears widely  patent with both vertebrals contributing. No intracranial stenosis is evident. 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 The overall residual collection of channels measures 4 x 5 x 6 mm. IMPRESSION: No evidence of acute infarction. Atrophy and small vessel disease is stable from priors. Remote RIGHT MCA territory infarct with encephalomalacia and gliosis; no evidence of acute infarct extension beyond the areas of chronic ischemia. MRA of the intracranial circulation demonstrates no flow reducing lesion of the carotid, vertebral, or basilar arteries. 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 The patient is at risk for recurrent subarachnoid hemorrhage and/or aneurysmal enlargement. Diagnostic cerebral angiography is warranted, to confirm and assess the severity of coil compaction, potentially with repeat endovascular  treatment based on those findings. Electronically Signed   By: Staci Righter M.D.   On: 06/29/2018 13:16   Mr Brain Wo Contrast  Result Date: 06/29/2018 CLINICAL DATA:  Found unresponsive. History of CVA. LEFT-sided weakness. EXAM: MRI HEAD WITHOUT CONTRAST MRA HEAD WITHOUT CONTRAST TECHNIQUE: Multiplanar, multiecho pulse sequences of the brain and surrounding structures were obtained without intravenous contrast. Angiographic images of the head were obtained using MRA technique without contrast. COMPARISON:  Code stroke CT 06/28/2018. 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 The patient was unable to remain motionless for the exam. Small or subtle lesions could be overlooked. MRI HEAD FINDINGS Brain: No acute infarction, hemorrhage, hydrocephalus, extra-axial collection or mass lesion. Generalized atrophy. Moderate to advanced T2 and FLAIR hyperintensities through the white matter, likely small vessel disease. Large remote RIGHT MCA territory infarct, encephalomalacia and gliosis is stable from priors. No peri-infarct extension to correlate with the history of LEFT-sided weakness. Vascular: Described separately. Skull and upper cervical spine: Normal marrow signal. Sinuses/Orbits: Negative. Other: None. MRA HEAD FINDINGS Stairstep artifact, in conjunction with patient motion, creates artifactual stenoses due to misregistration. The internal carotid arteries appear widely patent. The basilar artery appears widely patent with both vertebrals contributing. No intracranial stenosis is evident. 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 The overall residual collection of channels measures 4 x 5 x 6 mm. IMPRESSION: No evidence of acute infarction. Atrophy and small vessel disease is stable from priors.  Remote RIGHT MCA territory infarct with encephalomalacia and gliosis; no evidence of acute infarct extension beyond the areas of chronic ischemia. MRA of the intracranial circulation demonstrates no flow reducing lesion of the carotid, vertebral, or basilar arteries. 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 The patient is at risk for recurrent subarachnoid hemorrhage and/or aneurysmal enlargement. Diagnostic cerebral angiography is warranted, to confirm and assess the severity of coil compaction, potentially with repeat endovascular treatment based on those findings. Electronically Signed   By: Staci Righter M.D.   On: 06/29/2018 13:16   Ct Head Code Stroke Wo Contrast  Result Date: 06/28/2018 CLINICAL DATA:  Code stroke. Altered level of consciousness. EXAM: CT HEAD WITHOUT CONTRAST TECHNIQUE: Contiguous axial images were obtained from the base of the skull through the vertex without intravenous contrast. COMPARISON:  Multiple priors. FINDINGS: Brain: No acute stroke, acute  hemorrhage, mass lesion, hydrocephalus, or extra-axial fluid. Generalized atrophy. Remote RIGHT hemisphere infarct. 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 Vascular: No hyperdense vessel or unexpected calcification. Skull: Normal. Negative for fracture or focal lesion. Sinuses/Orbits: No acute finding. Other: None. ASPECTS ([place] Stroke Program Early CT Score) - Ganglionic level infarction (caudate, lentiform nuclei, internal capsule, insula, M1-M3 cortex): 7 - Supraganglionic infarction (M4-M6 cortex): 3 Total score (0-10 with 10 being normal): 10 Compared previous scan most recent 04/19/2015, there is significant further atrophy and encephalomalacia associated with the chronic RIGHT frontal infarct. Coil mass appears unchanged and there is no subarachnoid blood. IMPRESSION: 1.  Atrophy and small vessel disease. This is progressive since 2017. No acute intracranial findings. 2. 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 No evidence for recurrent subarachnoid hemorrhage. Coil mass appears unchanged. 3. ASPECTS is 10. These results were called by telephone at the time of interpretation on 06/28/2018 at 3:50 pm to Dr. Aletta Edouard , who verbally acknowledged these results. * Electronically Signed   By: Staci Righter M.D.   On: 06/28/2018 16:06    Procedures .Critical Care Performed by: Hayden Rasmussen, MD Authorized by: Hayden Rasmussen, MD   Critical care provider statement:    Critical care time (minutes):  45   Critical care time was exclusive of:  Separately billable procedures and treating other patients   Critical care was necessary to treat or prevent imminent or life-threatening deterioration of the following conditions:  CNS failure or compromise   Critical care was time spent personally by me on the following activities:  Discussions with consultants, evaluation of patient's response to treatment, examination of patient, ordering and performing treatments and interventions, ordering and review of laboratory studies, ordering and review of radiographic studies, pulse oximetry, re-evaluation of patient's condition, obtaining history from patient or surrogate, review of old charts and development of treatment plan with patient or surrogate   I assumed direction of critical care for this patient from another provider in my specialty: no     (including critical care time)  Medications Ordered in ED Medications - No data to display   Initial Impression / Assessment and Plan / ED Course  I have reviewed the triage vital signs and the nursing notes.  Pertinent labs & imaging results that were available during my care of the patient were reviewed by me and considered in my medical decision making (see chart for details).  Clinical  Course as of Jun 28 1316  Thu Jun 28, 2018  1600 Patient initially unresponsive but with improving mental status and motor exam during his time here.  He was initially hypotensive but has improved blood pressure now after some IV fluids.  Differential diagnosis includes CVA, TIA, seizure, sepsis, metabolic derangement   [MB]  1600 Code stroke was activated and the initial CT shows an old coiled aneurysm with some stroke.  Per radiology this appears to be stable from 2017.  I reviewed this image independently.  Discussed with tele-neurology who feels that the patient's neuro exam is improved and likely at his baseline now.  She does not recommend TPA and feels the differential is likely TIA versus seizure with postictal phase.  He continues to have an ongoing work-up for other etiologies.   [MB]  1805 Discussed with [facility] hospitalist Dr. Denton Brick who will evaluate the patient for admission.   [MB]    Clinical Course User Index [MB] Hayden Rasmussen, MD       *  Final Clinical Impressions(s) / ED Diagnoses   Final diagnoses:  Altered mental status, unspecified altered mental status type    ED Discharge Orders    None       Hayden Rasmussen, MD 06/29/18 1320
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
## 2018-06-28 NOTE — ED Notes (Signed)
Code Stroke 1696 VE 9381

## 2018-06-28 NOTE — Progress Notes (Signed)
CODE STROKE 1523 call time 1524 beeper 1538 exam started 1540 exam finished 1542 images to SOC,exam completed in [place],  [facility] called [facility]. Spoke with [employer].

## 2018-06-28 NOTE — H&P (Addendum)
History and Physical    Patient has a second chart- Chart merge pending, MRN number for second [id number].  Brian [name] PJK:[id number] DOB: 1946/01/20 DOA: 06/28/2018  PCP: Dione Housekeeper, MD   Patient coming from: Home  I have personally briefly reviewed patient's old medical records in [place]  Chief Complaint: Brian [name]  HPI: Brian [name] is a 73 y.o. 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  Patient was last seen normal at about 12 noon.  At the time of my evaluation patient is awake alert oriented and able to give me history, but he is unable to give details about the event or remember being unresponsive.  On EMS arrival patient was mouth breathing, also noted was left facial droop, and and left arm weakness.  Patient tells me that he has had chronic weakness of his left side from prior stroke, and some facial droop on the left side of his face also.  Patient also tells me he had seizures when he was in his 23s.  He last used cocaine about 2-3 nights ago.  Does not drink alcohol.  He denies chest pain or difficulty breathing.  No vomiting no loose stools.  No abdominal pain.  ED Course: Code stroke activated in the ED.  Per EDP, By the time EMS arrived most of patient's symptoms had resolved, patient was responsive and talking. Blood Pressure 120s to 170s.  UDS is positive for cocaine, cannabis, benzos.  Lactic acid 1.2.  Alcohol level less than 10.  Unremarkable CBC CMP.  UA not suggestive of infection.  EKG showed some ST abnormalities anterior lateral leads.  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  TeleNeurology consulted-  Impression  TIA versus seizure, paracortical injury could be seizure focus with him being altered.  Hospitalist to admit for possible TIA Versus seizures  Review of Systems: As per HPI all other systems reviewed and negative.  Past Medical History:  Diagnosis Date   Altered mental status    CVA (cerebral vascular accident) ([place])    DVT (deep venous thrombosis) ([place])    Hypertension    Ileus ([place])    Renal disorder    Sepsis ([place])     History reviewed. No pertinent surgical history.   has no history on file for tobacco, alcohol, and drug.  Not on File  Family history of hypertension  Prior to Admission medications   Not on File    Physical Exam: Vitals:   06/28/18 1815 06/28/18 1830 06/28/18 1845 06/28/18 1900  BP: (!) 123/97 (!) 147/108 (!) 173/102 (!) 130/99  Pulse: 93 94 87 91  Resp: 19 18 17  (!) 21  Temp:      TempSrc:      SpO2: 96% 96% 95% 97%  Weight:      Height:        Constitutional: NAD, calm, comfortable Vitals:   06/28/18 1815 06/28/18 1830 06/28/18 1845 06/28/18 1900  BP: (!) 123/97 (!) 147/108 (!) 173/102 (!) 130/99  Pulse: 93 94 87 91  Resp: 19 18 17  (!) 21  Temp:      TempSrc:      SpO2: 96% 96% 95% 97%  Weight:      Height:  Eyes: PERRL, lids and conjunctivae normal ENMT: Mucous membranes are moist. Posterior pharynx clear of any exudate or lesions. Neck: normal, supple, no masses, no thyromegaly Respiratory: clear to auscultation bilaterally, no wheezing, no crackles. Normal respiratory effort. No accessory muscle use.  Cardiovascular: Regular rate and rhythm, no murmurs / rubs / gallops. No extremity edema. 2+ pedal pulses.   Abdomen: no tenderness, no masses palpated. No hepatosplenomegaly. Bowel sounds positive.  Musculoskeletal: no clubbing / cyanosis. No joint deformity upper and lower extremities. Good ROM, no contractures. Normal muscle tone.  Skin: no rashes, lesions, ulcers. No induration Neurologic: Mild facial droop  left side of face, strength left upper and lower extremity 4 out of 5, with reduced grip on right hand-patient tells me these findings are not new..  Strength on right upper and lower extremities 5/5.  Psychiatric: Normal judgment and insight. Alert and oriented x 3. Normal mood.   Labs on Admission: I have personally reviewed following labs and imaging studies  CBC: Recent Labs  Lab 06/28/18 1630  WBC 5.0  NEUTROABS 3.1  HGB 14.7  HCT 47.7  MCV 92.1  PLT 229   Basic Metabolic Panel: Recent Labs  Lab 06/28/18 1630 06/28/18 1637  NA 137  --   K 4.1  --   CL 106  --   CO2 21*  --   GLUCOSE 93  --   BUN 16  --   CREATININE 1.04 1.00  CALCIUM 8.8*  --    Liver Function Tests: Recent Labs  Lab 06/28/18 1630  AST 23  ALT 15  ALKPHOS 67  BILITOT 0.6  PROT 8.7*  ALBUMIN 3.6   Coagulation Profile: Recent Labs  Lab 06/28/18 1630  INR 1.1   Cardiac Enzymes: Recent Labs  Lab 06/28/18 1630  TROPONINI <0.03   CBG: Recent Labs  Lab 06/28/18 1546  GLUCAP 91   Urine analysis:    Component Value Date/Time   COLORURINE YELLOW 06/28/2018 [place] 06/28/2018 1655   LABSPEC 1.019 06/28/2018 1655   PHURINE 6.0 06/28/2018 1655   GLUCOSEU NEGATIVE 06/28/2018 1655   HGBUR NEGATIVE 06/28/2018 1655   [place] 06/28/2018 1655   [place] 06/28/2018 1655   PROTEINUR 30 (A) 06/28/2018 1655   NITRITE NEGATIVE 06/28/2018 1655   LEUKOCYTESUR NEGATIVE 06/28/2018 1655    Radiological Exams on Admission: Ct Head Code Stroke Wo Contrast  Result Date: 06/28/2018 CLINICAL DATA:  Code stroke. Altered level of consciousness. EXAM: CT HEAD WITHOUT CONTRAST TECHNIQUE: Contiguous axial images were obtained from the base of the skull through the vertex without intravenous contrast. COMPARISON:  Multiple priors. FINDINGS: Brain: No acute stroke, acute hemorrhage, mass lesion, hydrocephalus, or extra-axial fluid. Generalized atrophy. Remote RIGHT  hemisphere infarct. 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 Vascular: No hyperdense vessel or unexpected calcification. Skull: Normal. Negative for fracture or focal lesion. Sinuses/Orbits: No acute finding. Other: None. ASPECTS ([place] Stroke Program Early CT Score) - Ganglionic level infarction (caudate, lentiform nuclei, internal capsule, insula, M1-M3 cortex): 7 - Supraganglionic infarction (M4-M6 cortex): 3 Total score (0-10 with 10 being normal): 10 Compared previous scan most recent 04/19/2015, there is significant further atrophy and encephalomalacia associated with the chronic RIGHT frontal infarct. Coil mass appears unchanged and there is no subarachnoid blood. IMPRESSION: 1. Atrophy and small vessel disease. This is progressive since 2017. No acute intracranial findings. 2. 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 No evidence for recurrent subarachnoid hemorrhage. Coil  mass appears unchanged. 3. ASPECTS is 10. These results were called by telephone at the time of interpretation on 06/28/2018 at 3:50 pm to Dr. Aletta Edouard , who verbally acknowledged these results. * Electronically Signed   By: Staci Righter M.D.   On: 06/28/2018 16:06    EKG: Independently reviewed.  ST elevations anterior lateral leads.  Sinus rhythm.  QTC 441.  Assessment/Plan Active Problems:   Unresponsiveness   Unresponsiveness-seizures versus possible TIA vs syncope vs related to substance abuse.  Left facial droop, and left-sided weakness initially reported improved back to patient's baseline by the time he arrived in the ED.  UDS positive for cocaine, cannabinoids, benzos.  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  Patient reports having seizures in his 47s.  Tele-neurology consulted in the ED.  -Swallow screen -Neurology consult -PRN Ativan, seizure  precautions -EEG -MRI, MRA -PT eval -Lipid panel, HgbA1c in a.m -Carotid Dopplers -Echocardiogram -Aspirin 325 x1, 81mg  daily -Atorvastatin 40 mg daily - Orthostatic vitals-negative.  Lying vitals 132/102, pulse 87. Standing vitals 127/108, pulse 98. - trend trop - Neuro Checks Q4H  Abnormal EKG-ST elevations in anterior lateral leads compared to EKG from 2016 this is new (seen on patients 2nd chart).  Troponin less than 0.03. Changes present on repeat EKG.  Patient denies chest pain or difficulty breathing.  No history of heart attacks. -I Talked to cardiologist on-call Dr. Meda Coffee, not think patient has an ST elevation MI, EKG changes are similar to changes seen when patient has had a neurologic event.  Recommended checking another troponin. - Trend Trop.  Subarachnoid hemorrhage, with residual left-sided deficits.  HTN- blood pressure systolic 287G to 811X.  In the event of possible TIA versus CVA, will allow for permissive hypertension -PRN labetalol  Substance abuse-he is positive for cocaine, benzos and cannabis.  Tells me last cocaine use was 2 to 3 days ago.  Denies alcohol intake.  DVT prophylaxis: heparin Code Status: Full Family Communication: None at bedside Disposition Plan: 1-2 days Consults called: neurology Admission status: Obs tele  Bethena Roys MD [facility]  06/28/2018, 7:16 PM

## 2018-06-28 NOTE — ED Triage Notes (Signed)
LKW 1200. Pt has a in-home CNA who states he was walking at 1230 this afternoon. Was placed in his recliner and was found unresponsive prior to calling EMS. EMS states he was mouth breathing upon arrival/

## 2018-06-29 ENCOUNTER — Observation Stay (HOSPITAL_COMMUNITY)
Admit: 2018-06-29 | Discharge: 2018-06-29 | Disposition: A | Discharge status: Home / Self Care | Payer: Medicare HMO | Attending: Internal Medicine | Admitting: Internal Medicine

## 2018-06-29 ENCOUNTER — Observation Stay (HOSPITAL_COMMUNITY): Payer: Medicare HMO

## 2018-06-29 ENCOUNTER — Observation Stay (HOSPITAL_BASED_OUTPATIENT_CLINIC_OR_DEPARTMENT_OTHER): Payer: Medicare HMO

## 2018-06-29 DIAGNOSIS — R4189 Other symptoms and signs involving cognitive functions and awareness: Secondary | ICD-10-CM | POA: Diagnosis not present

## 2018-06-29 DIAGNOSIS — F1410 Cocaine abuse, uncomplicated: Secondary | ICD-10-CM

## 2018-06-29 DIAGNOSIS — R4182 Altered mental status, unspecified: Secondary | ICD-10-CM | POA: Diagnosis not present

## 2018-06-29 DIAGNOSIS — R29818 Other symptoms and signs involving the nervous system: Secondary | ICD-10-CM | POA: Diagnosis not present

## 2018-06-29 DIAGNOSIS — R464 Slowness and poor responsiveness: Secondary | ICD-10-CM | POA: Diagnosis not present

## 2018-06-29 DIAGNOSIS — I6523 Occlusion and stenosis of bilateral carotid arteries: Secondary | ICD-10-CM | POA: Diagnosis not present

## 2018-06-29 DIAGNOSIS — I639 Cerebral infarction, unspecified: Secondary | ICD-10-CM | POA: Diagnosis not present

## 2018-06-29 LAB — TROPONIN I: Troponin I: <0.03 ng/mL (ref <0.03)

## 2018-06-29 LAB — MAGNESIUM: Magnesium: 1.9 mg/dL (ref 1.7–2.4)

## 2018-06-29 LAB — ECHOCARDIOGRAM COMPLETE BUBBLE STUDY
Height: 67.5 in
Weight: 2400 oz

## 2018-06-29 LAB — LIPID PANEL
Cholesterol: 124 mg/dL (ref 0–200)
HDL: 27 mg/dL — ABNORMAL LOW (ref >40)
LDL Cholesterol: 85 mg/dL (ref 0–99)
Total CHOL/HDL Ratio: 4.6 RATIO
Triglycerides: 61 mg/dL (ref <150)
VLDL: 12 mg/dL (ref 0–40)

## 2018-06-29 LAB — HEMOGLOBIN A1C
Hgb A1c MFr Bld: 4.8 % (ref 4.8–5.6)
Mean Plasma Glucose: 91.06 mg/dL

## 2018-06-29 MED ORDER — ASPIRIN 81 MG PO TBEC: 81.0000 mg | DELAYED_RELEASE_TABLET | Freq: Every day | ORAL | 3 refills | Status: AC

## 2018-06-29 NOTE — Progress Notes (Signed)
Removed 2 IVs to left and right AC, 2x2 gauze and paper tape applied to site, patient tolerated well.  Reviewed AVS with patient who verbalized understanding.  Patient transported home by a friend.

## 2018-06-29 NOTE — Progress Notes (Signed)
Curbside consult was obtained with this patient for an episode of unresponsiveness.  There is a history of recent cocaine use.  Dr. Manuella Ghazi had questions regarding his treatment especially regarding the MRI findings.  MRI is reviewed in person and shows no acute ischemic stroke.  There is a large right MCA encephalomalacia associated with significant increased signal on FLAIR imaging.  There is an area involving the basal ganglia which is associated with chronic hemorrhage.  MRI shows no intracranial disease that is of any significance.  However, there appears to be residual aneurysm involving the junction of the right MCA ICA area.  I recommend follow-up with the treating interventionalists for his aneurysm.  Aspirin 81 mg is recommended and follow-up with [place] is also recommended.

## 2018-06-29 NOTE — Discharge Summary (Addendum)
Physician Discharge Summary  Brian [name] JSE:[id number] DOB: [birth year] DOA: 06/28/2018  PCP: Dione Housekeeper, MD  Admit date: 06/28/2018  Discharge date: 06/29/2018  Admitted From:Home  Disposition:  Home  Recommendations for Outpatient Follow-up:  1. Follow up with PCP in 1-2 weeks 2. Follow-up with Dr. Merlene Laughter scheduled in 4 weeks 3. Follow-up with Dr. 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 At the time of my evaluation patient is awake alert oriented and able to give me history,but he is unable to give details about the event or remember being unresponsive. On EMS arrival patient was mouth breathing,also noted was left facial droop, and and left arm weakness. Patient tells me that he has had chronic weakness of his left side from prior stroke, and some facial droop on theleft side of hisface also. Patient also tells me he had seizures when he was in his 35s. He last used cocaine about 2-3 nights ago.Does not drink alcohol. He denies chest pain or difficulty breathing. No vomiting no loose stools. No abdominal pain.  Patient was admitted with unresponsiveness secondary to seizures versus possible TIA or syncopal episode.  EMS UDS was positive for cocaine, cannabinoids, and benzos.  No CVA or signs of seizure activity otherwise noted on EEG or brain MRI.  Please see imaging report of  MRI/MRA noted below.  He will require outpatient follow-up with neurosurgery to reevaluate cerebral angiogram as noted.  Patient has been advised on stopping cocaine use as well as marijuana.  He understands and will remain on aspirin low-dose as suggested by Dr. Merlene Laughter.  Dr. Merlene Laughter will follow up with patient in the outpatient setting as well as neurosurgery.  No other acute events noted during the course of this admission.  He has been seen by physical therapy with recommendations for home health PT.  2D echocardiogram with bubble study performed which needs to be reviewed in the outpatient setting.  Discharge Diagnoses:  Active Problems:   Unresponsiveness   Cocaine abuse (HCC)   HTN (hypertension)    Discharge Instructions  Discharge Instructions    Diet - low sodium heart healthy   Complete by:  As directed    Increase activity slowly   Complete by:  As directed      Allergies as of 06/29/2018   Not on File     Medication List    TAKE these medications   aspirin 81 MG EC tablet Take 1 tablet (81 mg total) by mouth daily for 30 days. Start taking on:  Jun 30, 2018      Follow-up Information    Dione Housekeeper, MD Follow up in 1 week(s).   Specialty:  Family Medicine Contact information: [address] [place] [address] [phone]        Phillips Odor, MD Follow up in 4 week(s).   Specialty:  Neurology Contact information: [address] [place] [address] [phone]        [name], MD Follow up in 2 week(s).   Specialty:  Neurosurgery Why:  Follow up MRI imaging; possible repeat cerebral angiogram. Coiling done 01/2014. Contact information: [address] [place] [address] [place] [address] [phone]          Not on File  Consultations:  Discussed with Dr. Merlene Laughter on phone   Procedures/Studies: Mr Virgel Paling Wo Contrast  Result Date: 06/29/2018 CLINICAL DATA:  Found unresponsive. History of CVA.  LEFT-sided weakness. EXAM: MRI HEAD WITHOUT CONTRAST MRA HEAD WITHOUT CONTRAST TECHNIQUE: Multiplanar, multiecho pulse sequences of the brain and surrounding structures were obtained without intravenous contrast. Angiographic images of the head were obtained using MRA technique without contrast. COMPARISON:  Code stroke CT 06/28/2018. 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 The patient was unable to remain motionless for the exam. Small or subtle lesions could be overlooked. MRI HEAD FINDINGS Brain: No acute infarction, hemorrhage, hydrocephalus, extra-axial collection or mass lesion. Generalized atrophy. Moderate to advanced T2 and FLAIR hyperintensities through the white matter, likely small vessel disease. Large remote RIGHT MCA territory infarct, encephalomalacia and gliosis is stable from priors. No peri-infarct extension to correlate with the history of LEFT-sided weakness. Vascular: Described separately. Skull and upper cervical spine: Normal marrow signal. Sinuses/Orbits: Negative. Other: None. MRA HEAD FINDINGS Stairstep artifact, in conjunction with patient motion, creates artifactual stenoses due to misregistration. The internal carotid arteries appear widely patent. The basilar artery appears widely patent with both vertebrals contributing. No intracranial stenosis is evident. 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 The overall residual collection of channels measures 4 x 5 x 6 mm. IMPRESSION: No evidence of acute infarction. Atrophy and small vessel disease is stable from priors. Remote RIGHT MCA territory infarct with encephalomalacia and gliosis; no evidence of acute infarct extension beyond the areas of chronic ischemia. MRA of the intracranial circulation demonstrates no flow  reducing lesion of the carotid, vertebral, or basilar arteries. 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 The patient is at risk for recurrent subarachnoid hemorrhage and/or aneurysmal enlargement. Diagnostic cerebral angiography is warranted, to confirm and assess the severity of coil compaction, potentially with repeat endovascular treatment based on those findings. Electronically Signed   By: Staci Righter M.D.   On: 06/29/2018 13:16   Mr Brain Wo Contrast  Result Date: 06/29/2018 CLINICAL DATA:  Found unresponsive. History of CVA. LEFT-sided weakness. EXAM: MRI HEAD WITHOUT CONTRAST MRA HEAD WITHOUT CONTRAST TECHNIQUE: Multiplanar, multiecho pulse sequences of the brain and surrounding structures were obtained without intravenous contrast. Angiographic images of the head were obtained using MRA technique without contrast. COMPARISON:  Code stroke CT 06/28/2018. 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 The patient was unable to remain motionless for the exam. Small or subtle lesions could be overlooked. MRI HEAD FINDINGS Brain: No acute infarction, hemorrhage, hydrocephalus, extra-axial collection or mass lesion. Generalized atrophy. Moderate to advanced T2 and FLAIR hyperintensities through the white matter, likely small vessel disease. Large remote RIGHT MCA territory infarct, encephalomalacia and gliosis is stable from priors. No peri-infarct extension to correlate with the history of LEFT-sided weakness. Vascular: Described separately. Skull and upper cervical spine: Normal marrow signal. Sinuses/Orbits: Negative. Other: None. MRA HEAD FINDINGS Stairstep artifact, in conjunction with patient motion, creates  artifactual stenoses due to misregistration. The internal carotid arteries appear widely patent. The  basilar artery appears widely patent with both vertebrals contributing. No intracranial stenosis is evident. 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 The overall residual collection of channels measures 4 x 5 x 6 mm. IMPRESSION: No evidence of acute infarction. Atrophy and small vessel disease is stable from priors. Remote RIGHT MCA territory infarct with encephalomalacia and gliosis; no evidence of acute infarct extension beyond the areas of chronic ischemia. MRA of the intracranial circulation demonstrates no flow reducing lesion of the carotid, vertebral, or basilar arteries. 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 The patient is at risk for recurrent subarachnoid hemorrhage and/or aneurysmal enlargement. Diagnostic cerebral angiography is warranted, to confirm and assess the severity of coil compaction, potentially with repeat endovascular treatment based on those findings. Electronically Signed   By: Staci Righter M.D.   On: 06/29/2018 13:16   US Carotid Bilateral  Result Date: 06/29/2018 CLINICAL DATA:  TIA.  Found unresponsive yesterday. EXAM: BILATERAL CAROTID DUPLEX ULTRASOUND TECHNIQUE: Pearline Cables scale imaging, color Doppler and duplex ultrasound were performed of bilateral carotid and vertebral arteries in the neck. COMPARISON:  Brain MRI 06/29/2018 FINDINGS: Criteria: Quantification of carotid stenosis is based on velocity parameters that correlate the residual internal carotid diameter with NASCET-based stenosis levels, using the diameter of the distal internal carotid lumen as the denominator for stenosis measurement. The following velocity measurements were obtained: RIGHT ICA: 92/36 cm/sec CCA: 29/93 cm/sec SYSTOLIC ICA/CCA RATIO:  1.5 ECA: 60 cm/sec LEFT  ICA: 56/20 cm/sec CCA: 71/69 cm/sec SYSTOLIC ICA/CCA RATIO:  1.1 ECA: 29 cm/sec RIGHT CAROTID ARTERY: Echogenic calcified plaque at the right carotid bulb. External carotid artery is patent with normal waveform. Calcified echogenic plaque in the proximal internal carotid artery. Normal waveforms and velocities in the internal carotid artery. RIGHT VERTEBRAL ARTERY: Antegrade flow and normal waveform in the right vertebral artery. LEFT CAROTID ARTERY: Small amount of plaque in the left common carotid artery. External carotid artery is patent with normal waveform. Minimal plaque at the left carotid bulb. Normal waveforms and velocities in the internal carotid artery. LEFT VERTEBRAL ARTERY: Antegrade flow and normal waveform in the left vertebral artery. IMPRESSION: Mild atherosclerotic disease in the bilateral carotid arteries. Estimated degree of stenosis in the internal carotid arteries is less than 50% bilaterally. Patent vertebral arteries with antegrade flow. Electronically Signed   By: Markus Daft M.D.   On: 06/29/2018 13:39   Ct Head Code Stroke Wo Contrast  Result Date: 06/28/2018 CLINICAL DATA:  Code stroke. Altered level of consciousness. EXAM: CT HEAD WITHOUT CONTRAST TECHNIQUE: Contiguous axial images were obtained from the base of the skull through the vertex without intravenous contrast. COMPARISON:  Multiple priors. FINDINGS: Brain: No acute stroke, acute hemorrhage, mass lesion, hydrocephalus, or extra-axial fluid. Generalized atrophy. Remote RIGHT hemisphere infarct. 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 Vascular: No hyperdense vessel or unexpected calcification. Skull: Normal. Negative for fracture or focal lesion. Sinuses/Orbits: No acute finding. Other: None. ASPECTS [facility] Stroke Program Early CT Score) - Ganglionic level infarction (caudate, lentiform nuclei, internal capsule, insula, M1-M3 cortex): 7 - Supraganglionic infarction (M4-M6 cortex): 3  Total score (0-10 with 10 being normal): 10 Compared previous scan most recent 04/19/2015, there is significant further atrophy and  encephalomalacia associated with the chronic RIGHT frontal infarct. Coil mass appears unchanged and there is no subarachnoid blood. IMPRESSION: 1. Atrophy and small vessel disease. This is progressive since 2017. No acute intracranial findings. 2. 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 No evidence for recurrent subarachnoid hemorrhage. Coil mass appears unchanged. 3. ASPECTS is 10. These results were called by telephone at the time of interpretation on 06/28/2018 at 3:50 pm to Dr. Aletta Edouard , who verbally acknowledged these results. * Electronically Signed   By: Staci Righter M.D.   On: 06/28/2018 16:06      Discharge Exam: Vitals:   06/29/18 0806 06/29/18 1206  BP: (!) 131/96 126/89  Pulse: 80 78  Resp: 18 16  Temp: 97.8 F (36.6 C) 98.1 F (36.7 C)  SpO2: 100% 100%   Vitals:   06/29/18 0006 06/29/18 0400 06/29/18 0806 06/29/18 1206  BP: (!) 126/98 (!) 142/103 (!) 131/96 126/89  Pulse: 81 78 80 78  Resp: 16 16 18 16   Temp: 97.7 F (36.5 C) 97.6 F (36.4 C) 97.8 F (36.6 C) 98.1 F (36.7 C)  TempSrc: Oral Oral Oral Oral  SpO2: 97% 98% 100% 100%  Weight:      Height:        General: Pt is alert, awake, not in acute distress Cardiovascular: RRR, S1/S2 +, no rubs, no gallops Respiratory: CTA bilaterally, no wheezing, no rhonchi Abdominal: Soft, NT, ND, bowel sounds + Extremities: no edema, no cyanosis    The results of significant diagnostics from this hospitalization (including imaging, microbiology, ancillary and laboratory) are listed below for reference.     Microbiology: Recent Results (from the past 240 hour(s))  SARS Coronavirus 2 (CEPHEID - Performed in [place] hospital lab), Hosp Order     Status: None   Collection Time: 06/28/18  3:35 PM  Result Value Ref Range Status   SARS Coronavirus 2  NEGATIVE NEGATIVE Final    Comment: (NOTE) If result is NEGATIVE SARS-CoV-2 target nucleic acids are NOT DETECTED. The SARS-CoV-2 RNA is generally detectable in upper and lower  respiratory specimens during the acute phase of infection. The lowest  concentration of SARS-CoV-2 viral copies this assay can detect is 250  copies / mL. A negative result does not preclude SARS-CoV-2 infection  and should not be used as the sole basis for treatment or other  patient management decisions.  A negative result may occur with  improper specimen collection / handling, submission of specimen other  than nasopharyngeal swab, presence of viral mutation(s) within the  areas targeted by this assay, and inadequate number of viral copies  (<250 copies / mL). A negative result must be combined with clinical  observations, patient history, and epidemiological information. If result is POSITIVE SARS-CoV-2 target nucleic acids are DETECTED. The SARS-CoV-2 RNA is generally detectable in upper and lower  respiratory specimens dur ing the acute phase of infection.  Positive  results are indicative of active infection with SARS-CoV-2.  Clinical  correlation with patient history and other diagnostic information is  necessary to determine patient infection status.  Positive results do  not rule out bacterial infection or co-infection with other viruses. If result is PRESUMPTIVE POSTIVE SARS-CoV-2 nucleic acids MAY BE PRESENT.   A presumptive positive result was obtained on the submitted specimen  and confirmed on repeat testing.  While 2019 novel coronavirus  (SARS-CoV-2) nucleic acids may be present in the submitted sample  additional confirmatory testing may be necessary for epidemiological  and / or clinical management purposes  to differentiate between  SARS-CoV-2 and other Sarbecovirus currently known to infect humans.  If clinically indicated additional testing with an alternate test  methodology [phone])  is advised. The SARS-CoV-2 RNA is generally  detectable in upper and lower respiratory sp ecimens during the acute  phase of infection. The expected result is Negative. Fact Sheet for Patients:  StrictlyIdeas.no Fact Sheet for Healthcare Providers: BankingDealers.co.za This test is not yet approved or cleared by the [place] FDA and has been authorized for detection and/or diagnosis of SARS-CoV-2 by FDA under an Emergency Use Authorization (EUA).  This EUA will remain in effect (meaning this test can be used) for the duration of the COVID-19 declaration under Section 564(b)(1) of the Act, 21 U.S.C. section 360bbb-3(b)(1), unless the authorization is terminated or revoked sooner. Performed at [facility], [address] [place], [place] [address]   Culture, blood (routine x 2)     Status: None (Preliminary result)   Collection Time: 06/28/18  4:30 PM  Result Value Ref Range Status   Specimen Description LEFT ANTECUBITAL  Final   Special Requests   Final    BOTTLES DRAWN AEROBIC AND ANAEROBIC Blood Culture adequate volume   Culture   Final    NO GROWTH < 24 HOURS Performed at [facility], [address] [place], [place] [address]    Report Status PENDING  Incomplete  Culture, blood (routine x 2)     Status: None (Preliminary result)   Collection Time: 06/28/18  4:31 PM  Result Value Ref Range Status   Specimen Description BLOOD LEFT HAND  Final   Special Requests   Final    BOTTLES DRAWN AEROBIC AND ANAEROBIC Blood Culture adequate volume   Culture   Final    NO GROWTH < 24 HOURS Performed at [facility], [address], [place],  [address]    Report Status PENDING  Incomplete     Labs: BNP (last 3 results) No results for input(s): BNP in the last 8760 hours. Basic Metabolic Panel: Recent Labs  Lab 06/28/18 1630 06/28/18 1637 06/29/18 0357  NA 137  --   --   K 4.1  --   --   CL 106  --   --   CO2 21*  --    --   GLUCOSE 93  --   --   BUN 16  --   --   CREATININE 1.04 1.00  --   CALCIUM 8.8*  --   --   MG  --   --  1.9   Liver Function Tests: Recent Labs  Lab 06/28/18 1630  AST 23  ALT 15  ALKPHOS 67  BILITOT 0.6  PROT 8.7*  ALBUMIN 3.6   No results for input(s): LIPASE, AMYLASE in the last 168 hours. No results for input(s): AMMONIA in the last 168 hours. CBC: Recent Labs  Lab 06/28/18 1630  WBC 5.0  NEUTROABS 3.1  HGB 14.7  HCT 47.7  MCV 92.1  PLT 164   Cardiac Enzymes: Recent Labs  Lab 06/28/18 1630 06/28/18 2147 06/29/18 0357  TROPONINI <0.03 <0.03 <0.03   BNP: Invalid input(s): POCBNP CBG: Recent Labs  Lab 06/28/18 1546  GLUCAP 91   D-Dimer No results for input(s): DDIMER in the last 72 hours. Hgb A1c Recent Labs    06/28/18 2147  HGBA1C 4.8   Lipid Profile Recent Labs    06/29/18 0357  CHOL 124  HDL 27*  LDLCALC  85  TRIG 61  CHOLHDL 4.6   Thyroid function studies No results for input(s): TSH, T4TOTAL, T3FREE, THYROIDAB in the last 72 hours.  Invalid input(s): FREET3 Anemia work up No results for input(s): VITAMINB12, FOLATE, FERRITIN, TIBC, IRON, RETICCTPCT in the last 72 hours. Urinalysis    Component Value Date/Time   COLORURINE YELLOW 06/28/2018 1655   APPEARANCEUR CLEAR 06/28/2018 1655   LABSPEC 1.019 06/28/2018 1655   PHURINE 6.0 06/28/2018 1655   GLUCOSEU NEGATIVE 06/28/2018 1655   HGBUR NEGATIVE 06/28/2018 1655   [place] 06/28/2018 [place] 06/28/2018 1655   PROTEINUR 30 (A) 06/28/2018 1655   NITRITE NEGATIVE 06/28/2018 1655   LEUKOCYTESUR NEGATIVE 06/28/2018 1655   Sepsis Labs Invalid input(s): PROCALCITONIN,  WBC,  LACTICIDVEN Microbiology Recent Results (from the past 240 hour(s))  SARS Coronavirus 2 (CEPHEID - Performed in [place] hospital lab), Hosp Order     Status: None   Collection Time: 06/28/18  3:35 PM  Result Value Ref Range Status   SARS Coronavirus 2 NEGATIVE NEGATIVE  Final    Comment: (NOTE) If result is NEGATIVE SARS-CoV-2 target nucleic acids are NOT DETECTED. The SARS-CoV-2 RNA is generally detectable in upper and lower  respiratory specimens during the acute phase of infection. The lowest  concentration of SARS-CoV-2 viral copies this assay can detect is 250  copies / mL. A negative result does not preclude SARS-CoV-2 infection  and should not be used as the sole basis for treatment or other  patient management decisions.  A negative result may occur with  improper specimen collection / handling, submission of specimen other  than nasopharyngeal swab, presence of viral mutation(s) within the  areas targeted by this assay, and inadequate number of viral copies  (<250 copies / mL). A negative result must be combined with clinical  observations, patient history, and epidemiological information. If result is POSITIVE SARS-CoV-2 target nucleic acids are DETECTED. The SARS-CoV-2 RNA is generally detectable in upper and lower  respiratory specimens dur ing the acute phase of infection.  Positive  results are indicative of active infection with SARS-CoV-2.  Clinical  correlation with patient history and other diagnostic information is  necessary to determine patient infection status.  Positive results do  not rule out bacterial infection or co-infection with other viruses. If result is PRESUMPTIVE POSTIVE SARS-CoV-2 nucleic acids MAY BE PRESENT.   A presumptive positive result was obtained on the submitted specimen  and confirmed on repeat testing.  While 2019 novel coronavirus  (SARS-CoV-2) nucleic acids may be present in the submitted sample  additional confirmatory testing may be necessary for epidemiological  and / or clinical management purposes  to differentiate between  SARS-CoV-2 and other Sarbecovirus currently known to infect humans.  If clinically indicated additional testing with an alternate test  methodology [phone]) is advised. The  SARS-CoV-2 RNA is generally  detectable in upper and lower respiratory sp ecimens during the acute  phase of infection. The expected result is Negative. Fact Sheet for Patients:  StrictlyIdeas.no Fact Sheet for Healthcare Providers: BankingDealers.co.za This test is not yet approved or cleared by the [place] FDA and has been authorized for detection and/or diagnosis of SARS-CoV-2 by FDA under an Emergency Use Authorization (EUA).  This EUA will remain in effect (meaning this test can be used) for the duration of the COVID-19 declaration under Section 564(b)(1) of the Act, 21 U.S.C. section 360bbb-3(b)(1), unless the authorization is terminated or revoked sooner. Performed at [facility], [phone]  [address] [place], [place] [address]   Culture, blood (routine x 2)     Status: None (Preliminary result)   Collection Time: 06/28/18  4:30 PM  Result Value Ref Range Status   Specimen Description LEFT ANTECUBITAL  Final   Special Requests   Final    BOTTLES DRAWN AEROBIC AND ANAEROBIC Blood Culture adequate volume   Culture   Final    NO GROWTH < 24 HOURS Performed at [facility], [address] [place], [place] [address]    Report Status PENDING  Incomplete  Culture, blood (routine x 2)     Status: None (Preliminary result)   Collection Time: 06/28/18  4:31 PM  Result Value Ref Range Status   Specimen Description BLOOD LEFT HAND  Final   Special Requests   Final    BOTTLES DRAWN AEROBIC AND ANAEROBIC Blood Culture adequate volume   Culture   Final    NO GROWTH < 24 HOURS Performed at [facility], [address], [place], [place] [address]    Report Status PENDING  Incomplete     Time coordinating discharge: 35 minutes  SIGNED:   Rodena Goldmann, DO [facility] 06/29/2018, 2:40 PM  If 7PM-7AM, please contact night-coverage [email or web address] Password [email or web address]

## 2018-06-29 NOTE — Evaluation (Signed)
Physical Therapy Evaluation Patient Details Name: Brian [name] MRN: [id number] DOB: [birth year] Today's Date: 06/29/2018   History of Present Illness  Brian [name] is a 73 y.o. 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  Patient was last seen normal at about 12 noon.  At the time of my evaluation patient is awake alert oriented and able to give me history, but he is unable to give details about the event or remember being unresponsive.  On EMS arrival patient was mouth breathing, also noted was left facial droop, and and left arm weakness.  Patient tells me that he has had chronic weakness of his left side from prior stroke, and some facial droop on the left side of his face also.     Clinical Impression  Patient functioning near baseline for functional mobility and gait, demonstrates slow labored movement for transfers and ambulation in room/hallway, scissors legs which is baseline per patient and he states he usually uses an brace for LLE, (possibly an AFO), but lost it 6 months ago while visiting [place].  Patient informed to follow up with his PCP for replacement brace.  Patient tolerated sitting up in chair after therapy - nursing staff notified.  Patient will benefit from continued physical therapy in hospital and recommended venue below to increase strength, balance, endurance for safe ADLs and gait.     Follow Up Recommendations Home health PT;Supervision - Intermittent;Supervision for mobility/OOB    Equipment Recommendations  None recommended by PT;Other (comment)(Left AFO (lost his brace while visiting [place]))    Recommendations for Other Services       Precautions / Restrictions Precautions Precautions: Fall Restrictions Weight  Bearing Restrictions: No      Mobility  Bed Mobility Overal bed mobility: Needs Assistance Bed Mobility: Supine to Sit     Supine to sit: Min guard;Min assist     General bed mobility comments: increased time, had to use bedrail  Transfers Overall transfer level: Needs assistance Equipment used: Rolling walker (2 wheeled) Transfers: Sit to/from [facility] Sit to Stand: Min guard Stand pivot transfers: Min guard       General transfer comment: patient does better when not held by gait belt, states "it throws off his balance"  Ambulation/Gait Ambulation/Gait assistance: Min guard Gait Distance (Feet): 60 Feet Assistive device: Rolling walker (2 wheeled) Gait Pattern/deviations: Decreased step length - right;Decreased step length - left;Decreased stride length;Scissoring Gait velocity: decreased   General Gait Details: slow labored cadence with scissoring of legs, no loss of balance, limited secondary to c/o fatigue  Stairs            Wheelchair Mobility    Modified Rankin (Stroke Patients Only)       Balance Overall balance assessment: Needs assistance Sitting-balance support: Feet supported;No upper extremity supported Sitting balance-Leahy Scale: Good     Standing balance support: During functional activity;No upper extremity supported Standing balance-Leahy Scale: Poor Standing balance comment: fair using RW                             Pertinent Vitals/Pain Pain Assessment: No/denies pain    Home Living Family/patient expects to be discharged to:: Private residence Living Arrangements: Children Available Help at Discharge: Family;Personal care attendant;Available 24  hours/day Type of Home: Mobile home Home Access: Stairs to enter Entrance Stairs-Rails: Left Entrance Stairs-Number of Steps: 3 Home Layout: One level Home Equipment: Cane - single point;Walker - 2 wheels;Electric scooter      Prior Function Level of  Independence: Needs assistance   Gait / Transfers Assistance Needed: household ambulator with SPC most of time, sometimes uses RW  ADL's / Homemaking Assistance Needed: home aides x 7 days/week x 4 hours/day, son available at night        Hand Dominance        Extremity/Trunk Assessment   Upper Extremity Assessment Upper Extremity Assessment: Generalized weakness;RUE deficits/detail;LUE deficits/detail RUE Deficits / Details: grossly 4+/5 LUE Deficits / Details: grossly -4/5    Lower Extremity Assessment Lower Extremity Assessment: Generalized weakness;RLE deficits/detail;LLE deficits/detail RLE Deficits / Details: grossly 4+/5 LLE Deficits / Details: grossly -4/5    Cervical / Trunk Assessment Cervical / Trunk Assessment: Kyphotic  Communication   Communication: No difficulties  Cognition Arousal/Alertness: Awake/alert Behavior During Therapy: WFL for tasks assessed/performed Overall Cognitive Status: Within Functional Limits for tasks assessed                                        General Comments      Exercises     Assessment/Plan    PT Assessment Patient needs continued PT services  PT Problem List Decreased strength;Decreased activity tolerance;Decreased balance;Decreased mobility       PT Treatment Interventions Therapeutic exercise;Gait training;Stair training;Functional mobility training;Therapeutic activities;Patient/family education    PT Goals (Current goals can be found in the Care Plan section)  Acute Rehab PT Goals Patient Stated Goal: return home with family and home aides to assist PT Goal Formulation: With patient Time For Goal Achievement: 07/06/18 Potential to Achieve Goals: Good    Frequency 7X/week   Barriers to discharge        Co-evaluation               AM-PAC PT "6 Clicks" Mobility  Outcome Measure Help needed turning from your back to your side while in a flat bed without using bedrails?: None Help  needed moving from lying on your back to sitting on the side of a flat bed without using bedrails?: A Little Help needed moving to and from a bed to a chair (including a wheelchair)?: A Little Help needed standing up from a chair using your arms (e.g., wheelchair or bedside chair)?: A Little Help needed to walk in hospital room?: A Little Help needed climbing 3-5 steps with a railing? : A Lot 6 Click Score: 18    End of Session Equipment Utilized During Treatment: Gait belt Activity Tolerance: Patient tolerated treatment well;Patient limited by fatigue Patient left: in chair;with call bell/phone within reach;with chair alarm set Nurse Communication: Mobility status PT Visit Diagnosis: Unsteadiness on feet (R26.81);Other abnormalities of gait and mobility (R26.89);Muscle weakness (generalized) (M62.81)    Time: 4481-8563 PT Time Calculation (min) (ACUTE ONLY): 31 min   Charges:   PT Evaluation $PT Eval Moderate Complexity: 1 Mod PT Treatments $Therapeutic Activity: 23-37 mins        10:07 AM, 06/29/18 Lonell Grandchild, MPT Physical Therapist with [facility] [phone] office [phone] mobile phone

## 2018-06-29 NOTE — Progress Notes (Signed)
*  PRELIMINARY RESULTS* Echocardiogram 2D Echocardiogram has been performed with saline bubble study.  Brian [name] 06/29/2018, 12:54 PM

## 2018-06-29 NOTE — Plan of Care (Signed)
  Problem: Acute Rehab PT Goals(only PT should resolve) Goal: Pt Will Go Supine/Side To Sit Outcome: Progressing Flowsheets (Taken 06/29/2018 1010) Pt will go Supine/Side to Sit: with modified independence Goal: Patient Will Transfer Sit To/From Stand Outcome: Progressing Flowsheets (Taken 06/29/2018 1010) Patient will transfer sit to/from stand: with supervision Goal: Pt Will Transfer Bed To Chair/Chair To Bed Outcome: Progressing Flowsheets (Taken 06/29/2018 1010) Pt will Transfer Bed to Chair/Chair to Bed: with supervision Goal: Pt Will Ambulate Outcome: Progressing Flowsheets (Taken 06/29/2018 1010) Pt will Ambulate: 75 feet; with rolling walker; with supervision   10:11 AM, 06/29/18 Lonell Grandchild, MPT Physical Therapist with [facility] [phone] office [phone] mobile phone

## 2018-06-29 NOTE — Procedures (Signed)
ELECTROENCEPHALOGRAM REPORT   Patient: Brian [name]       Room #: I356 EEG No. ID: [id number] Age: 73 y.o.        Sex: male Referring Physician: Manuella Ghazi Report Date:  06/29/2018        Interpreting Physician: Alexis Goodell  History: Edgardo Petrenko is an 73 y.o. male with an episode of unresponsiveness  Medications:  ASA, Lipitor  Conditions of Recording:  This is a 21 channel routine scalp EEG performed with bipolar and monopolar montages arranged in accordance to the international 10/20 system of electrode placement. One channel was dedicated to EKG recording.  The patient is in the awake and drowsy states.  Description:  The waking background activity consists of a low voltage, symmetrical, fairly well organized, 11 Hz alpha activity, seen from the parieto-occipital and posterior temporal regions.  Low voltage fast activity, poorly organized, is seen anteriorly and is at times superimposed on more posterior regions.  A mixture of theta and alpha rhythms are seen from the central and temporal regions. The patient drowses with slowing to irregular, low voltage theta and beta activity.   Stage II sleep is not obtained. No epileptiform activity is noted.   Hyperventilation and intermittent photic stimulation were not performed.  IMPRESSION: Normal electroencephalogram, awake and drowsy. There are no focal lateralizing or epileptiform features.   Alexis Goodell, MD Neurology [phone] 06/29/2018, 12:52 PM

## 2018-06-29 NOTE — TOC Transition Note (Signed)
Transition of Care [facility]) - CM/SW Discharge Note   Patient Details  Name: Brian [name] MRN: [id number] Date of Birth: 1945/04/16  [facility] CM/SW Contact:  Sherald Barge, RN Phone Number: 06/29/2018, 3:42 PM   Clinical Narrative:   obs for unresponsiveness. Pt from home, has FT aid services. Referred for [facility] PT. Pt has used [place] in the past and would like them again. CM attempted to contact daughter for DC plan discussion with no answer. Confidential VM was left with no return call at the time of DC.     Final next level of care: [place]     Patient Goals and CMS Choice Patient states their goals for this hospitalization and ongoing recovery are:: get out of the hospital CMS Medicare.gov Compare Post Acute Care list provided to:: Patient Choice offered to / list presented to : Patient  Discharge Plan and Services     Post Acute Care Choice: [place]: PT [facility] Agency: [place] ([facility]) Date [place]: 06/29/18   Representative spoke with at [place]: [place]  Readmission Risk Interventions Readmission Risk Prevention Plan 06/29/2018  Post Dischage Appt Complete  Medication Screening Complete  Transportation Screening Complete

## 2018-06-29 NOTE — Progress Notes (Signed)
EEG completed, results pending. 

## 2018-07-02 ENCOUNTER — Encounter (HOSPITAL_COMMUNITY): Payer: Self-pay | Admitting: *Deleted

## 2018-07-02 DIAGNOSIS — I10 Essential (primary) hypertension: Secondary | ICD-10-CM | POA: Diagnosis not present

## 2018-07-02 DIAGNOSIS — F1410 Cocaine abuse, uncomplicated: Secondary | ICD-10-CM | POA: Diagnosis not present

## 2018-07-02 DIAGNOSIS — I69054 Hemiplegia and hemiparesis following nontraumatic subarachnoid hemorrhage affecting left non-dominant side: Secondary | ICD-10-CM | POA: Diagnosis not present

## 2018-07-02 DIAGNOSIS — Z9181 History of falling: Secondary | ICD-10-CM | POA: Diagnosis not present

## 2018-07-03 LAB — CULTURE, BLOOD (ROUTINE X 2)
Culture: NO GROWTH
Culture: NO GROWTH
Special Requests: ADEQUATE
Special Requests: ADEQUATE

## 2018-07-04 DIAGNOSIS — I10 Essential (primary) hypertension: Secondary | ICD-10-CM | POA: Diagnosis not present

## 2018-07-04 DIAGNOSIS — F1410 Cocaine abuse, uncomplicated: Secondary | ICD-10-CM | POA: Diagnosis not present

## 2018-07-04 DIAGNOSIS — I69054 Hemiplegia and hemiparesis following nontraumatic subarachnoid hemorrhage affecting left non-dominant side: Secondary | ICD-10-CM | POA: Diagnosis not present

## 2018-07-04 DIAGNOSIS — Z9181 History of falling: Secondary | ICD-10-CM | POA: Diagnosis not present

## 2018-07-10 DIAGNOSIS — Z9181 History of falling: Secondary | ICD-10-CM | POA: Diagnosis not present

## 2018-07-10 DIAGNOSIS — F1410 Cocaine abuse, uncomplicated: Secondary | ICD-10-CM | POA: Diagnosis not present

## 2018-07-10 DIAGNOSIS — I69054 Hemiplegia and hemiparesis following nontraumatic subarachnoid hemorrhage affecting left non-dominant side: Secondary | ICD-10-CM | POA: Diagnosis not present

## 2018-07-10 DIAGNOSIS — I10 Essential (primary) hypertension: Secondary | ICD-10-CM | POA: Diagnosis not present

## 2018-07-12 DIAGNOSIS — I69054 Hemiplegia and hemiparesis following nontraumatic subarachnoid hemorrhage affecting left non-dominant side: Secondary | ICD-10-CM | POA: Diagnosis not present

## 2018-07-12 DIAGNOSIS — I10 Essential (primary) hypertension: Secondary | ICD-10-CM | POA: Diagnosis not present

## 2018-07-12 DIAGNOSIS — F1410 Cocaine abuse, uncomplicated: Secondary | ICD-10-CM | POA: Diagnosis not present

## 2018-07-12 DIAGNOSIS — Z9181 History of falling: Secondary | ICD-10-CM | POA: Diagnosis not present

## 2018-07-24 DIAGNOSIS — I69054 Hemiplegia and hemiparesis following nontraumatic subarachnoid hemorrhage affecting left non-dominant side: Secondary | ICD-10-CM | POA: Diagnosis not present

## 2018-07-24 DIAGNOSIS — I10 Essential (primary) hypertension: Secondary | ICD-10-CM | POA: Diagnosis not present

## 2018-07-24 DIAGNOSIS — F1410 Cocaine abuse, uncomplicated: Secondary | ICD-10-CM | POA: Diagnosis not present

## 2018-07-24 DIAGNOSIS — Z9181 History of falling: Secondary | ICD-10-CM | POA: Diagnosis not present

## 2018-08-29 ENCOUNTER — Other Ambulatory Visit (HOSPITAL_COMMUNITY): Payer: Self-pay | Admitting: Neurosurgery

## 2018-08-29 ENCOUNTER — Telehealth (HOSPITAL_COMMUNITY): Payer: Self-pay | Admitting: Radiology

## 2018-08-29 DIAGNOSIS — I609 Nontraumatic subarachnoid hemorrhage, unspecified: Secondary | ICD-10-CM

## 2018-08-29 NOTE — Telephone Encounter (Signed)
Called pt to set up angiogram with Dr. Kathyrn Sheriff. Pt will call back and let me know if he has a ride for 09/11/18. JM

## 2018-09-03 ENCOUNTER — Other Ambulatory Visit: Payer: Self-pay | Admitting: Neurosurgery

## 2018-09-03 DIAGNOSIS — I609 Nontraumatic subarachnoid hemorrhage, unspecified: Secondary | ICD-10-CM

## 2018-09-11 ENCOUNTER — Other Ambulatory Visit (HOSPITAL_COMMUNITY): Payer: Medicare Other

## 2018-09-11 ENCOUNTER — Encounter (HOSPITAL_COMMUNITY): Payer: Self-pay

## 2018-09-11 ENCOUNTER — Ambulatory Visit (HOSPITAL_COMMUNITY): Payer: Medicare Other

## 2018-09-12 ENCOUNTER — Other Ambulatory Visit: Payer: Self-pay | Admitting: Neurosurgery

## 2018-09-14 ENCOUNTER — Other Ambulatory Visit (HOSPITAL_COMMUNITY): Payer: Medicare Other

## 2018-09-18 ENCOUNTER — Inpatient Hospital Stay (HOSPITAL_COMMUNITY): Admission: RE | Admit: 2018-09-18 | Payer: Medicare Other | Source: Ambulatory Visit

## 2018-09-21 ENCOUNTER — Other Ambulatory Visit (HOSPITAL_COMMUNITY)
Admission: RE | Admit: 2018-09-21 | Discharge: 2018-09-21 | Disposition: A | Discharge status: Home / Self Care | Payer: Medicare Other | Source: Ambulatory Visit | Attending: Neurosurgery | Admitting: Neurosurgery

## 2018-09-21 ENCOUNTER — Other Ambulatory Visit: Payer: Self-pay

## 2018-09-21 DIAGNOSIS — Z20828 Contact with and (suspected) exposure to other viral communicable diseases: Secondary | ICD-10-CM | POA: Diagnosis not present

## 2018-09-21 DIAGNOSIS — Z01812 Encounter for preprocedural laboratory examination: Secondary | ICD-10-CM | POA: Diagnosis present

## 2018-09-21 LAB — SARS CORONAVIRUS 2 (TAT 6-24 HRS): SARS Coronavirus 2: NEGATIVE

## 2018-09-25 ENCOUNTER — Other Ambulatory Visit (HOSPITAL_COMMUNITY): Payer: Self-pay | Admitting: Neurosurgery

## 2018-09-25 ENCOUNTER — Other Ambulatory Visit: Payer: Self-pay

## 2018-09-25 ENCOUNTER — Encounter (HOSPITAL_COMMUNITY): Payer: Self-pay | Admitting: Neurosurgery

## 2018-09-25 ENCOUNTER — Ambulatory Visit (HOSPITAL_COMMUNITY)
Admission: RE | Admit: 2018-09-25 | Discharge: 2018-09-25 | Disposition: A | Discharge status: Home / Self Care | Payer: Medicare Other | Source: Ambulatory Visit | Attending: Neurosurgery | Admitting: Neurosurgery

## 2018-09-25 DIAGNOSIS — I609 Nontraumatic subarachnoid hemorrhage, unspecified: Secondary | ICD-10-CM

## 2018-09-25 DIAGNOSIS — F17210 Nicotine dependence, cigarettes, uncomplicated: Secondary | ICD-10-CM | POA: Insufficient documentation

## 2018-09-25 DIAGNOSIS — Z9049 Acquired absence of other specified parts of digestive tract: Secondary | ICD-10-CM | POA: Insufficient documentation

## 2018-09-25 DIAGNOSIS — D649 Anemia, unspecified: Secondary | ICD-10-CM | POA: Diagnosis not present

## 2018-09-25 DIAGNOSIS — I671 Cerebral aneurysm, nonruptured: Secondary | ICD-10-CM | POA: Diagnosis not present

## 2018-09-25 DIAGNOSIS — Z79899 Other long term (current) drug therapy: Secondary | ICD-10-CM | POA: Insufficient documentation

## 2018-09-25 DIAGNOSIS — Z7901 Long term (current) use of anticoagulants: Secondary | ICD-10-CM | POA: Insufficient documentation

## 2018-09-25 DIAGNOSIS — I10 Essential (primary) hypertension: Secondary | ICD-10-CM | POA: Insufficient documentation

## 2018-09-25 DIAGNOSIS — N179 Acute kidney failure, unspecified: Secondary | ICD-10-CM | POA: Diagnosis not present

## 2018-09-25 DIAGNOSIS — Z8619 Personal history of other infectious and parasitic diseases: Secondary | ICD-10-CM | POA: Insufficient documentation

## 2018-09-25 DIAGNOSIS — Z86718 Personal history of other venous thrombosis and embolism: Secondary | ICD-10-CM | POA: Diagnosis not present

## 2018-09-25 DIAGNOSIS — K219 Gastro-esophageal reflux disease without esophagitis: Secondary | ICD-10-CM | POA: Diagnosis not present

## 2018-09-25 DIAGNOSIS — Z8673 Personal history of transient ischemic attack (TIA), and cerebral infarction without residual deficits: Secondary | ICD-10-CM | POA: Insufficient documentation

## 2018-09-25 HISTORY — PX: IR ANGIO INTRA EXTRACRAN SEL INTERNAL CAROTID UNI R MOD SED: IMG5362

## 2018-09-25 LAB — CBC WITH DIFFERENTIAL/PLATELET
Abs Immature Granulocytes: 0.02 10*3/uL (ref 0.00–0.07)
Basophils Absolute: 0.1 10*3/uL (ref 0.0–0.1)
Basophils Relative: 1 %
Eosinophils Absolute: 0.6 10*3/uL — ABNORMAL HIGH (ref 0.0–0.5)
Eosinophils Relative: 9 %
HCT: 44.1 % (ref 39.0–52.0)
Hemoglobin: 14 g/dL (ref 13.0–17.0)
Immature Granulocytes: 0 %
Lymphocytes Relative: 36 %
Lymphs Abs: 2.4 10*3/uL (ref 0.7–4.0)
MCH: 29 pg (ref 26.0–34.0)
MCHC: 31.7 g/dL (ref 30.0–36.0)
MCV: 91.3 fL (ref 80.0–100.0)
Monocytes Absolute: 0.6 10*3/uL (ref 0.1–1.0)
Monocytes Relative: 9 %
Neutro Abs: 2.9 10*3/uL (ref 1.7–7.7)
Neutrophils Relative %: 45 %
Platelets: 177 10*3/uL (ref 150–400)
RBC: 4.83 MIL/uL (ref 4.22–5.81)
RDW: 13.5 % (ref 11.5–15.5)
WBC: 6.5 10*3/uL (ref 4.0–10.5)
nRBC: 0 % (ref 0.0–0.2)

## 2018-09-25 LAB — URINALYSIS, ROUTINE W REFLEX MICROSCOPIC
Bacteria, UA: NONE SEEN
Bilirubin Urine: NEGATIVE
Glucose, UA: NEGATIVE mg/dL
Hgb urine dipstick: NEGATIVE
Ketones, ur: NEGATIVE mg/dL
Leukocytes,Ua: NEGATIVE
Nitrite: NEGATIVE
Protein, ur: 30 mg/dL — AB
Specific Gravity, Urine: 1.023 (ref 1.005–1.030)
pH: 5 (ref 5.0–8.0)

## 2018-09-25 LAB — PROTIME-INR
INR: 1 (ref 0.8–1.2)
Prothrombin Time: 13.3 seconds (ref 11.4–15.2)

## 2018-09-25 LAB — BASIC METABOLIC PANEL
Anion gap: 8 (ref 5–15)
BUN: 12 mg/dL (ref 8–23)
CO2: 25 mmol/L (ref 22–32)
Calcium: 9.1 mg/dL (ref 8.9–10.3)
Chloride: 104 mmol/L (ref 98–111)
Creatinine, Ser: 1.09 mg/dL (ref 0.61–1.24)
GFR calc Af Amer: >60 mL/min (ref >60)
GFR calc non Af Amer: >60 mL/min (ref >60)
Glucose, Bld: 87 mg/dL (ref 70–99)
Potassium: 4.2 mmol/L (ref 3.5–5.1)
Sodium: 137 mmol/L (ref 135–145)

## 2018-09-25 LAB — APTT: aPTT: 32 seconds (ref 24–36)

## 2018-09-25 MED ORDER — HEPARIN SODIUM (PORCINE) 1000 UNIT/ML IJ SOLN
INTRAMUSCULAR | Status: AC
  Filled 2018-09-25: qty 1

## 2018-09-25 MED ORDER — LIDOCAINE HCL (PF) 1 % IJ SOLN
INTRAMUSCULAR | Status: AC | PRN
  Administered 2018-09-25: 1 mL

## 2018-09-25 MED ORDER — LIDOCAINE HCL 1 % IJ SOLN
INTRAMUSCULAR | Status: AC
  Filled 2018-09-25: qty 20

## 2018-09-25 MED ORDER — NITROGLYCERIN 1 MG/10 ML FOR IR/CATH LAB
INTRA_ARTERIAL | Status: AC
  Filled 2018-09-25: qty 10

## 2018-09-25 MED ORDER — VERAPAMIL HCL 2.5 MG/ML IV SOLN
INTRAVENOUS | Status: AC
  Filled 2018-09-25: qty 2

## 2018-09-25 MED ORDER — CEFAZOLIN SODIUM-DEXTROSE 2-4 GM/100ML-% IV SOLN: 2.0000 g | INTRAVENOUS | Status: DC

## 2018-09-25 MED ORDER — CHLORHEXIDINE GLUCONATE CLOTH 2 % EX PADS: 6.0000 | MEDICATED_PAD | Freq: Once | CUTANEOUS | Status: DC

## 2018-09-25 MED ORDER — MIDAZOLAM HCL 2 MG/2ML IJ SOLN
INTRAMUSCULAR | Status: AC
  Filled 2018-09-25: qty 2

## 2018-09-25 MED ORDER — FENTANYL CITRATE (PF) 100 MCG/2ML IJ SOLN
INTRAMUSCULAR | Status: AC
  Filled 2018-09-25: qty 2

## 2018-09-25 MED ORDER — MIDAZOLAM HCL 2 MG/2ML IJ SOLN
INTRAMUSCULAR | Status: AC | PRN
  Administered 2018-09-25: 1 mg via INTRAVENOUS

## 2018-09-25 MED ORDER — IOHEXOL 300 MG/ML  SOLN
100.0000 mL | Freq: Once | INTRAMUSCULAR | Status: AC | PRN
  Administered 2018-09-25: 25 mL via INTRA_ARTERIAL

## 2018-09-25 MED ORDER — HEPARIN SODIUM (PORCINE) 1000 UNIT/ML IJ SOLN
INTRAMUSCULAR | Status: AC | PRN
  Administered 2018-09-25: 3000 [IU] via INTRAVENOUS

## 2018-09-25 MED ORDER — FENTANYL CITRATE (PF) 100 MCG/2ML IJ SOLN
INTRAMUSCULAR | Status: AC | PRN
  Administered 2018-09-25: 25 ug via INTRAVENOUS
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
## 2018-09-25 NOTE — Brief Op Note (Signed)
  NEUROSURGERY BRIEF OPERATIVE  NOTE   PREOP DX: Cerebral aneurysm  POSTOP DX: Same  PROCEDURE: Diagnostic cerebral angiogram  SURGEON: Dr. [name], MD  ANESTHESIA: IV Sedation with Local  EBL: Minimal  SPECIMENS: None  COMPLICATIONS: None  CONDITION: Stable to recovery  FINDINGS (Full report in [facility]): 1. Recurrent previously coiled RICA terminus aneurysm measuring 6.2x8.2x5.17mm

## 2018-09-25 NOTE — Discharge Instructions (Signed)
Moderate Conscious Sedation, Adult, Care After These instructions provide you with information about caring for yourself after your procedure. Your health care provider may also give you more specific instructions. Your treatment has been planned according to current medical practices, but problems sometimes occur. Call your health care provider if you have any problems or questions after your procedure. What can I expect after the procedure? After your procedure, it is common:  To feel sleepy for several hours.  To feel clumsy and have poor balance for several hours.  To have poor judgment for several hours.  To vomit if you eat too soon. Follow these instructions at home: For at least 24 hours after the procedure:   Do not: ? Participate in activities where you could fall or become injured. ? Drive. ? Use heavy machinery. ? Drink alcohol. ? Take sleeping pills or medicines that cause drowsiness. ? Make important decisions or sign legal documents. ? Take care of children on your own.  Rest. Eating and drinking  Follow the diet recommended by your health care provider.  If you vomit: ? Drink water, juice, or soup when you can drink without vomiting. ? Make sure you have little or no nausea before eating solid foods. General instructions  Have a responsible adult stay with you until you are awake and alert.  Take over-the-counter and prescription medicines only as told by your health care provider.  If you smoke, do not smoke without supervision.  Keep all follow-up visits as told by your health care provider. This is important. Contact a health care provider if:  You keep feeling nauseous or you keep vomiting.  You feel light-headed.  You develop a rash.  You have a fever. Get help right away if:  You have trouble breathing. This information is not intended to replace advice given to you by your health care provider. Make sure you discuss any questions you  have with your health care provider. Document Released: 11/28/2012 Document Revised: 01/20/2017 Document Reviewed: 05/30/2015 Elsevier Patient Education  2020 [place] LEVEL OF HEART Radial Site Care   This sheet gives you information about how to care for yourself after your procedure. Your health care provider may also give you more specific instructions. If you have problems or questions, contact your health care provider. What can I expect after the procedure? After the procedure, it is common to have:  Bruising and tenderness at the catheter insertion area. Follow these instructions at home: Medicines  Take over-the-counter and prescription medicines only as told by your health care provider. Insertion site care  Follow instructions from your health care provider about how to take care of your insertion site. Make sure you: ? Wash your hands with soap and water before you change your bandage (dressing). If soap and water are not available, use hand sanitizer. ? Change your dressing as told by your health care provider. ? Leave stitches (sutures), skin glue, or adhesive strips in place. These skin closures may need to stay in place for 2 weeks or longer. If adhesive strip edges start to loosen and curl up, you may trim the loose edges. Do not remove adhesive strips completely unless your health care provider tells you to do that.  Check your insertion site every day for signs of infection. Check for: ? Redness, swelling, or pain. ? Fluid or blood. ? Pus or a bad smell. ? Warmth.  Do not take baths, swim, or use a hot  tub until your health care provider approves. °· You may shower 24-48 hours after the procedure, or as directed by your health care provider. °? Remove the dressing and gently wash the site with plain soap and water. °? Pat the area dry with a clean towel. °? Do not rub the site. That could cause bleeding. °· Do not apply powder or lotion  to the site. °Activity ° °· For 24 hours after the procedure, or as directed by your health care provider: °? Do not flex or bend the affected arm. °? Do not push or pull heavy objects with the affected arm. °? Do not drive yourself home from the hospital or clinic. You may drive 24 hours after the procedure unless your health care provider tells you not to. °? Do not operate machinery or power tools. °· Do not lift anything that is heavier than 10 lb (4.5 kg), or the limit that you are told, until your health care provider says that it is safe. °· Ask your health care provider when it is okay to: °? Return to work or school. °? Resume usual physical activities or sports. °? Resume sexual activity. °General instructions °· If the catheter site starts to bleed, raise your arm and put firm pressure on the site. If the bleeding does not stop, get help right away. This is a medical emergency. °· If you went home on the same day as your procedure, a responsible adult should be with you for the first 24 hours after you arrive home. °· Keep all follow-up visits as told by your health care provider. This is important. °Contact a health care provider if: °· You have a fever. °· You have redness, swelling, or yellow drainage around your insertion site. °Get help right away if: °· You have unusual pain at the radial site. °· The catheter insertion area swells very fast. °· The insertion area is bleeding, and the bleeding does not stop when you hold steady pressure on the area. °· Your arm or hand becomes pale, cool, tingly, or numb. °These symptoms may represent a serious problem that is an emergency. Do not wait to see if the symptoms will go away. Get medical help right away. Call your local emergency services (911 in the [place] Do not drive yourself to the hospital. °Summary °· After the procedure, it is common to have bruising and tenderness at the site. °· Follow instructions from your health care provider about how to  take care of your radial site wound. Check the wound every day for signs of infection. °· Do not lift anything that is heavier than 10 lb (4.5 kg), or the limit that you are told, until your health care provider says that it is safe. °This information is not intended to replace advice given to you by your health care provider. Make sure you discuss any questions you have with your health care provider. °Document Released: 03/12/2010 Document Revised: 03/15/2017 Document Reviewed: 03/15/2017 °Elsevier Patient Education © 2020 Elsevier Inc. ° °

## 2019-01-31 ENCOUNTER — Other Ambulatory Visit: Payer: Self-pay | Admitting: Neurosurgery

## 2019-02-03 NOTE — Progress Notes (Signed)
Spoke to patient about going by [facility] tomorrow for [employer]. Patient verbalized understanding and said he would go by tomorrow.

## 2019-02-04 ENCOUNTER — Inpatient Hospital Stay (HOSPITAL_COMMUNITY): Payer: Medicare Other | Admitting: Vascular Surgery

## 2019-02-04 ENCOUNTER — Encounter (HOSPITAL_COMMUNITY): Payer: Self-pay | Admitting: Neurosurgery

## 2019-02-04 ENCOUNTER — Other Ambulatory Visit: Payer: Self-pay | Admitting: Neurosurgery

## 2019-02-04 ENCOUNTER — Other Ambulatory Visit: Payer: Self-pay
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
## 2019-02-04 NOTE — Anesthesia Preprocedure Evaluation (Signed)
Anesthesia Evaluation Anesthesia Physical Anesthesia Plan  ASA:   Anesthesia Plan:    Post-op Pain Management:    Induction:   PONV Risk Score and Plan:   Airway Management Planned:   Additional Equipment:   Intra-op Plan:   Post-operative Plan:   Informed Consent:   Plan Discussed with:   Anesthesia Plan Comments: (See PAT note written 02/04/2019 by Myra Gianotti, PA-C. )        Anesthesia Quick Evaluation

## 2019-02-04 NOTE — Progress Notes (Addendum)
Anesthesia Chart Review: SAME DAY WORK-UP   Case: [id number] Date/Time: 02/05/19 1057   Procedure: RIGHT CRANIOTOMY FOR CLIPPING OF INTERNAL CAROTID ARTERY ANEURYSM (N/A ) - RIGHT CRANIOTOMY FOR CLIPPING OF INTERNAL CAROTID ARTERY ANEURYSM   Anesthesia type: General   Pre-op diagnosis: CEREBRAL ANEURYSM, NONRUPTURED   [facility] / [facility]   Surgeons: [name], MD      DISCUSSION: Patient is a 73 year old male scheduled for the above procedure. He had coiling of RICA aneurysm in 2015 following SAH. On 06/28/18 evaluated in ED following AMS, reported left sided weakness, +UDS for cocaine and THC. No acute infarct on MRI (old right infarct) but MRI concerning for recannulization of RICA aneurysm. EEG negative. No significant ICA stenosis of carotid US. Negative bubble study. Given history of previously ruptured RICA aneurysm, the above procedure recommended.   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 So far test is pending--Nikki at Dr. Cleotilde Neer office notified, and she will follow-up with patient.  (ADDENDUM 02/04/19 4:05 PM: Patient unable to arrange transportation to get COVID test this afternoon. Per Lexine Baton, Dr. Kathyrn Sheriff had posted cased as urgent. Discussed with Rolla Flatten, RN with plan for same day COVID-19 test.)   VS:   BP Readings from Last 3 Encounters:  09/25/18 118/86  06/29/18 126/89  04/20/15 108/74    PROVIDERS: Patient, No Pcp Per   LABS: He is for updated labs on the day of his procedure.   IMAGES/OTHER: Diagnostic cerebral angiogram 09/25/18: IMPRESSION: 1. Recanalization of previously coiled right internal carotid artery terminus aneurysm, now measuring 6.2 x 8.2 x 5.3  mm.  EEG 06/29/18: IMPRESSION: Normal electroencephalogram, awake and drowsy. There are no focal lateralizing or epileptiform features.  MRI/MRA head 06/29/18: IMPRESSION: - No evidence of acute infarction. Atrophy and small vessel disease is stable from priors. - Remote RIGHT MCA territory infarct with encephalomalacia and gliosis; no evidence of acute infarct extension beyond the areas of chronic ischemia. - MRA of the intracranial circulation demonstrates no flow reducing lesion of the carotid, vertebral, or basilar arteries. - Recanalization of the previously treated RIGHT ICA terminus aneurysm, with multiple irregular channels of flow related enhancement, measuring 4 x 5 x 6 mm. The patient is at risk for recurrent subarachnoid hemorrhage and/or aneurysmal enlargement.   EKG: 06/28/18: Sinus rhythm Abnormal R-wave progression, early transition Probable left ventricular hypertrophy Confirmed by Orpah Greek [phone]) on 06/29/2018 7:11:15 AM   CV: Echo with Bubble Study 06/29/18: IMPRESSIONS  1. The left ventricle has a visually estimated ejection fraction of approximately 55%. The cavity size was normal. There is moderately increased left ventricular wall thickness. Left ventricular diastolic Doppler parameters are consistent with impaired  relaxation. No evidence of left ventricular regional wall motion abnormalities.  2. The right ventricle has normal systolic function. The cavity was normal. There is no increase in right ventricular wall thickness. Right ventricular systolic pressure could not be assessed.  3. The aortic valve is tricuspid. Moderate aortic annular calcification noted.  4. The mitral valve is grossly normal. There is mild mitral annular calcification present.  5. The tricuspid valve is grossly normal.  6. The aortic root is normal in size and structure.  7. There is no obvious interatrial shunting with  saline bubble contrast.   Carotid US  06/29/18: IMPRESSION: - Mild atherosclerotic disease in the bilateral carotid arteries. Estimated degree of stenosis in the internal carotid arteries is less than 50% bilaterally. - Patent vertebral arteries with antegrade flow.   Past Medical History:  Diagnosis Date  . Altered mental status   . Colitis 12/16/2010   Vasculitis  . CVA (cerebral infarction)   . CVA (cerebral vascular accident) ([place])   . DVT (deep venous thrombosis) ([place])   . Hypertension   . Ileus ([place])   . Renal disorder   . Sepsis ([place])   . Sepsis(995.91)   . Stroke ([place])   . Vascular insufficiency     Past Surgical History:  Procedure Laterality Date  . BACK SURGERY  2002/2009  . IR ANGIO INTRA EXTRACRAN SEL INTERNAL CAROTID UNI R MOD SED  09/25/2018  . LAPAROTOMY  12/18/2010   Procedure: EXPLORATORY LAPAROTOMY;  Surgeon: Donato Heinz;  Location: [facility];  Service: General;  Laterality: N/A;  . PARTIAL COLECTOMY  12/18/2010   Procedure: PARTIAL COLECTOMY;  Surgeon: Donato Heinz;  Location: [facility];  Service: General;  Laterality: N/A;  . RADIOLOGY WITH ANESTHESIA N/A 02/20/2014   Procedure: RADIOLOGY WITH ANESTHESIA;  Surgeon: [name], MD;  Location: [place];  Service: Radiology;  Laterality: N/A;    MEDICATIONS: No current facility-administered medications for this encounter.   Marland Kitchen amitriptyline (ELAVIL) 25 MG tablet  . amLODipine (NORVASC) 10 MG tablet  . gabapentin (NEURONTIN) 600 MG tablet  . traMADol (ULTRAM) 50 MG tablet  . VOLTAREN 1 % GEL    Myra Gianotti, PA-C Surgical Short Stay/Anesthesiology [facility] Phone [phone] [facility] Phone [phone] 02/04/2019 1:47 PM

## 2019-02-04 NOTE — Progress Notes (Signed)
Patient denies shortness of breath, fever, cough and chest pain.  PCP - Lars Mage, NP Cardiologist - denies  Chest x-ray - denies EKG - 06/29/18 Stress Test - denies ECHO - 06/29/18 Cardiac Cath - denies  Anesthesia review: Yes  STOP now taking any Aspirin (unless otherwise instructed by your surgeon), Aleve, Naproxen, Ibuprofen, Motrin, Advil, Goody's, BC's, all herbal medications, fish oil, and all vitamins.   Coronavirus Screening Have you experienced the following symptoms:  Cough yes/no: No Fever (>100.22F)  yes/no: No Runny nose yes/no: No Sore throat yes/no: No Difficulty breathing/shortness of breath  yes/no: No  Have you traveled in the last 14 days and where? yes/no: No  Patient verbalized understanding of instructions that were given via phone.

## 2019-02-05 ENCOUNTER — Ambulatory Visit (HOSPITAL_COMMUNITY)
Admission: RE | Admit: 2019-02-05 | Discharge: 2019-02-05 | Disposition: A | Discharge status: Home / Self Care | Payer: Medicare Other | Attending: Neurosurgery | Admitting: Neurosurgery

## 2019-02-05 ENCOUNTER — Encounter (HOSPITAL_COMMUNITY)
Admission: RE | Disposition: A | Discharge status: Home / Self Care | Payer: Self-pay | Source: Home / Self Care | Attending: Neurosurgery

## 2019-02-05 ENCOUNTER — Encounter (HOSPITAL_COMMUNITY): Payer: Self-pay | Admitting: Neurosurgery

## 2019-02-05 ENCOUNTER — Other Ambulatory Visit: Payer: Self-pay | Admitting: Neurosurgery

## 2019-02-05 DIAGNOSIS — I720 Aneurysm of carotid artery: Secondary | ICD-10-CM | POA: Insufficient documentation

## 2019-02-05 DIAGNOSIS — F1410 Cocaine abuse, uncomplicated: Secondary | ICD-10-CM | POA: Insufficient documentation

## 2019-02-05 DIAGNOSIS — I69954 Hemiplegia and hemiparesis following unspecified cerebrovascular disease affecting left non-dominant side: Secondary | ICD-10-CM | POA: Diagnosis not present

## 2019-02-05 DIAGNOSIS — I10 Essential (primary) hypertension: Secondary | ICD-10-CM | POA: Diagnosis not present

## 2019-02-05 DIAGNOSIS — Z20828 Contact with and (suspected) exposure to other viral communicable diseases: Secondary | ICD-10-CM | POA: Insufficient documentation

## 2019-02-05 DIAGNOSIS — Z5309 Procedure and treatment not carried out because of other contraindication: Secondary | ICD-10-CM | POA: Insufficient documentation

## 2019-02-05 DIAGNOSIS — Z79899 Other long term (current) drug therapy: Secondary | ICD-10-CM | POA: Insufficient documentation

## 2019-02-05 DIAGNOSIS — F17210 Nicotine dependence, cigarettes, uncomplicated: Secondary | ICD-10-CM | POA: Diagnosis not present

## 2019-02-05 DIAGNOSIS — K219 Gastro-esophageal reflux disease without esophagitis: Secondary | ICD-10-CM | POA: Insufficient documentation

## 2019-02-05 HISTORY — DX: Benign prostatic hyperplasia without lower urinary tract symptoms: N40.0

## 2019-02-05 HISTORY — DX: Other intervertebral disc degeneration, lumbar region without mention of lumbar back pain or lower extremity pain: M51.369

## 2019-02-05 HISTORY — DX: Foot drop, left foot: M21.372

## 2019-02-05 HISTORY — DX: Other reduced mobility: Z74.09

## 2019-02-05 HISTORY — DX: Other symptoms and signs involving the musculoskeletal system: R29.898

## 2019-02-05 HISTORY — DX: Other chronic pain: G89.29

## 2019-02-05 HISTORY — DX: Other intervertebral disc degeneration, lumbar region: M51.36

## 2019-02-05 LAB — COMPREHENSIVE METABOLIC PANEL
ALT: 13 U/L (ref 0–44)
AST: 20 U/L (ref 15–41)
Albumin: 3.4 g/dL — ABNORMAL LOW (ref 3.5–5.0)
Alkaline Phosphatase: 69 U/L (ref 38–126)
Anion gap: 9 (ref 5–15)
BUN: 14 mg/dL (ref 8–23)
CO2: 26 mmol/L (ref 22–32)
Calcium: 9 mg/dL (ref 8.9–10.3)
Chloride: 104 mmol/L (ref 98–111)
Creatinine, Ser: 1.17 mg/dL (ref 0.61–1.24)
GFR calc Af Amer: >60 mL/min (ref >60)
GFR calc non Af Amer: >60 mL/min (ref >60)
Glucose, Bld: 85 mg/dL (ref 70–99)
Potassium: 4.3 mmol/L (ref 3.5–5.1)
Sodium: 139 mmol/L (ref 135–145)
Total Bilirubin: 0.8 mg/dL (ref 0.3–1.2)
Total Protein: 8 g/dL (ref 6.5–8.1)

## 2019-02-05 LAB — CBC
HCT: 46.5 % (ref 39.0–52.0)
Hemoglobin: 14.6 g/dL (ref 13.0–17.0)
MCH: 29 pg (ref 26.0–34.0)
MCHC: 31.4 g/dL (ref 30.0–36.0)
MCV: 92.4 fL (ref 80.0–100.0)
Platelets: 171 10*3/uL (ref 150–400)
RBC: 5.03 MIL/uL (ref 4.22–5.81)
RDW: 13.4 % (ref 11.5–15.5)
WBC: 5.7 10*3/uL (ref 4.0–10.5)
nRBC: 0 % (ref 0.0–0.2)

## 2019-02-05 LAB — RAPID URINE DRUG SCREEN, HOSP PERFORMED
Amphetamines: NOT DETECTED
Barbiturates: NOT DETECTED
Benzodiazepines: NOT DETECTED
Cocaine: POSITIVE — AB
Opiates: NOT DETECTED
Tetrahydrocannabinol: NOT DETECTED

## 2019-02-05 LAB — RESPIRATORY PANEL BY RT PCR (FLU A&B, COVID)
Influenza A by PCR: NEGATIVE
Influenza B by PCR: NEGATIVE
SARS Coronavirus 2 by RT PCR: NEGATIVE

## 2019-02-05 SURGERY — CRANIOTOMY INTRACRANIAL ANEURYSM FOR CAROTID
Anesthesia: General

## 2019-02-05 MED ORDER — ROCURONIUM BROMIDE 10 MG/ML (PF) SYRINGE
PREFILLED_SYRINGE | INTRAVENOUS | Status: AC
  Filled 2019-02-05: qty 10

## 2019-02-05 MED ORDER — LIDOCAINE 2% (20 MG/ML) 5 ML SYRINGE
INTRAMUSCULAR | Status: AC
  Filled 2019-02-05: qty 5

## 2019-02-05 MED ORDER — LACTATED RINGERS IV SOLN: INTRAVENOUS | Status: DC

## 2019-02-05 MED ORDER — CEFAZOLIN SODIUM-DEXTROSE 2-4 GM/100ML-% IV SOLN: 2.0000 g | INTRAVENOUS | Status: DC

## 2019-02-05 MED ORDER — FENTANYL CITRATE (PF) 250 MCG/5ML IJ SOLN
INTRAMUSCULAR | Status: AC
  Filled 2019-02-05: qty 5

## 2019-02-05 MED ORDER — PROPOFOL 10 MG/ML IV BOLUS
INTRAVENOUS | Status: AC
  Filled 2019-02-05: qty 40

## 2019-02-05 MED ORDER — CHLORHEXIDINE GLUCONATE CLOTH 2 % EX PADS: 6.0000 | MEDICATED_PAD | Freq: Once | CUTANEOUS | Status: DC

## 2019-02-05 NOTE — Progress Notes (Addendum)
Notified Dr. Glennon Mac and Dr. Kathyrn Sheriff of UDS. Per Dr. Kathyrn Sheriff patient will be cancelled.

## 2019-02-05 NOTE — Progress Notes (Signed)
Patient discharged home via daughter's car.

## 2019-02-25 NOTE — Pre-Procedure Instructions (Signed)
Brian [name]  02/25/2019    Your procedure is scheduled on Thursday, February 28, 2019 at 12:40 PM.   Report to [facility] Entrance "A" Admitting Office at 10:40 AM.   Call this number if you have problems the morning of surgery: [phone]   Questions prior to day of surgery, please call [phone] between 8 & 4 PM.   Remember:  Do not eat or drink after midnight Wednesday, 02/27/19.  Take these medicines the morning of surgery with A SIP OF WATER: Amlodipine (Norvasc), Gabapentin (Neurontin), Tramadol (Ultram) - if needed.  Stop Voltaren as of today prior to surgery. Do not use other NSAIDS (Ibuprofen, Aleve, etc), Aspirin products (Goody's, BC Powders, etc), Herbal medications or recreational drugs.    Do not wear jewelry.  Do not wear lotions, powders, cologne or deodorant.  Men may shave face and neck.  Do not bring valuables to the hospital.  [facility] is not responsible for any belongings or valuables.  Contacts, dentures or bridgework may not be worn into surgery.  Leave your suitcase in the car.  After surgery it may be brought to your room.  For patients admitted to the hospital, discharge time will be determined by your treatment team.  [facility] - Preparing for Surgery  Before surgery, you can play an important role.  Because skin is not sterile, your skin needs to be as free of germs as possible.  You can reduce the number of germs on you skin by washing with CHG (chlorahexidine gluconate) soap before surgery.  CHG is an antiseptic cleaner which kills germs and bonds with the skin to continue killing germs even after washing.  Oral Hygiene is also important in reducing the risk of infection.  Remember to brush your teeth with your regular toothpaste the morning of surgery.  Please DO NOT use if you have an allergy to CHG or antibacterial soaps.  If your skin becomes reddened/irritated stop using the CHG and inform your nurse when you arrive at Short  Stay.  Do not shave (including legs and underarms) for at least 48 hours prior to the first CHG shower.  You may shave your face.  Please follow these instructions carefully:   1.  Shower with CHG Soap the night before surgery and the morning of Surgery.  2.  If you choose to wash your hair, wash your hair first as usual with your normal shampoo.  3.  After you shampoo, rinse your hair and body thoroughly to remove the shampoo. 4.  Use CHG as you would any other liquid soap.  You can apply chg directly to the skin and wash gently with a      scrungie or washcloth.           5.  Apply the CHG Soap to your body ONLY FROM THE NECK DOWN.   Do not use on open wounds or open sores. Avoid contact with your eyes, ears, mouth and genitals (private parts).  Wash genitals (private parts) with your normal soap - do this prior to using CHG soap.  6.  Wash thoroughly, paying special attention to the area where your surgery will be performed.  7.  Thoroughly rinse your body with warm water from the neck down.  8.  DO NOT shower/wash with your normal soap after using and rinsing off the CHG Soap.  9.  Pat yourself dry with a clean towel.  10.  Wear clean pajamas.            11.  Place clean sheets on your bed the night of your first shower and do not sleep with pets.  Day of Surgery  Shower as above.  Do not apply any lotions/deodorants the morning of surgery.   Please wear clean clothes to the hospital. Remember to brush your teeth with toothpaste.   Please read over the fact sheets that you were given.

## 2019-02-26 ENCOUNTER — Encounter (HOSPITAL_COMMUNITY): Payer: Self-pay

## 2019-02-26 ENCOUNTER — Other Ambulatory Visit (HOSPITAL_COMMUNITY)
Admission: RE | Admit: 2019-02-26 | Discharge: 2019-02-26 | Disposition: A | Discharge status: Home / Self Care | Payer: Medicare Other | Source: Ambulatory Visit | Attending: Neurosurgery | Admitting: Neurosurgery

## 2019-02-26 ENCOUNTER — Other Ambulatory Visit: Payer: Self-pay

## 2019-02-26 ENCOUNTER — Encounter (HOSPITAL_COMMUNITY)
Admission: RE | Admit: 2019-02-26 | Discharge: 2019-02-26 | Disposition: A | Discharge status: Home / Self Care | Payer: Medicare Other | Source: Ambulatory Visit | Attending: Neurosurgery | Admitting: Neurosurgery

## 2019-02-26 DIAGNOSIS — Z01812 Encounter for preprocedural laboratory examination: Secondary | ICD-10-CM | POA: Insufficient documentation

## 2019-02-26 DIAGNOSIS — Z20822 Contact with and (suspected) exposure to covid-19: Secondary | ICD-10-CM | POA: Insufficient documentation

## 2019-02-26 LAB — TYPE AND SCREEN
ABO/RH(D): O POS
Antibody Screen: NEGATIVE

## 2019-02-26 LAB — BASIC METABOLIC PANEL
Anion gap: 9 (ref 5–15)
BUN: 15 mg/dL (ref 8–23)
CO2: 25 mmol/L (ref 22–32)
Calcium: 9.1 mg/dL (ref 8.9–10.3)
Chloride: 102 mmol/L (ref 98–111)
Creatinine, Ser: 1.03 mg/dL (ref 0.61–1.24)
GFR calc Af Amer: >60 mL/min (ref >60)
GFR calc non Af Amer: >60 mL/min (ref >60)
Glucose, Bld: 117 mg/dL — ABNORMAL HIGH (ref 70–99)
Potassium: 3.9 mmol/L (ref 3.5–5.1)
Sodium: 136 mmol/L (ref 135–145)

## 2019-02-26 LAB — CBC
HCT: 44.5 % (ref 39.0–52.0)
Hemoglobin: 13.8 g/dL (ref 13.0–17.0)
MCH: 28.8 pg (ref 26.0–34.0)
MCHC: 31 g/dL (ref 30.0–36.0)
MCV: 92.7 fL (ref 80.0–100.0)
Platelets: 181 10*3/uL (ref 150–400)
RBC: 4.8 MIL/uL (ref 4.22–5.81)
RDW: 13.2 % (ref 11.5–15.5)
WBC: 5.7 10*3/uL (ref 4.0–10.5)
nRBC: 0 % (ref 0.0–0.2)

## 2019-02-26 LAB — SARS CORONAVIRUS 2 (TAT 6-24 HRS): SARS Coronavirus 2: NEGATIVE

## 2019-02-26 NOTE — Progress Notes (Signed)
PCP - denies Cardiologist - n/a  Chest x-ray - n/a EKG - 06/28/18 Stress Test - denies ECHO - 06/29/18 Cardiac Cath - denies  Sleep Study - denies  COVID TEST- will get Covid test done today.   Anesthesia review: see Allison's note from 02/04/19  Patient denies shortness of breath, fever, cough and chest pain at PAT appointment   All instructions explained to the patient, with a verbal understanding of the material. Patient agrees to go over the instructions while at home for a better understanding. Patient also instructed to self quarantine after being tested for COVID-19. The opportunity to ask questions was provided.  Reviewed visitor policy with pt and he voiced understanding.   Patient thought he was coming for surgery today. He sent his aide home and she does not have a phone. I called his daughter who is here in [place] and she is trying to get off work to come and get him.

## 2019-02-26 NOTE — Progress Notes (Signed)
Pt's daughter was unable to leave work, pt was able to contact his aide and she is coming to pick him up. Pt was provided lunch while he was waiting. After lunch I took pt to front entrance where he will wait for his ride. I told him again that he has to go by the [facility] to get his Covid test before he goes home. He voiced understanding.

## 2019-02-27 ENCOUNTER — Other Ambulatory Visit: Payer: Self-pay | Admitting: Neurosurgery

## 2019-02-28 ENCOUNTER — Other Ambulatory Visit: Payer: Self-pay

## 2019-02-28 ENCOUNTER — Encounter (HOSPITAL_COMMUNITY)
Admission: RE | Disposition: A | Discharge status: Home / Self Care | Payer: Self-pay | Source: Home / Self Care | Attending: Neurosurgery

## 2019-02-28 ENCOUNTER — Inpatient Hospital Stay (HOSPITAL_COMMUNITY): Payer: Medicare Other | Admitting: Certified Registered Nurse Anesthetist

## 2019-02-28 ENCOUNTER — Encounter (HOSPITAL_COMMUNITY): Payer: Self-pay | Admitting: Neurosurgery

## 2019-02-28 ENCOUNTER — Inpatient Hospital Stay (HOSPITAL_COMMUNITY)
Admission: RE | Admit: 2019-02-28 | Discharge: 2019-03-02 | DRG: 026 | Disposition: A | Discharge status: Home Health | Payer: Medicare Other | Attending: Neurosurgery | Admitting: Neurosurgery

## 2019-02-28 DIAGNOSIS — Z9889 Other specified postprocedural states: Secondary | ICD-10-CM

## 2019-02-28 DIAGNOSIS — I10 Essential (primary) hypertension: Secondary | ICD-10-CM | POA: Diagnosis present

## 2019-02-28 DIAGNOSIS — Z20822 Contact with and (suspected) exposure to covid-19: Secondary | ICD-10-CM | POA: Diagnosis present

## 2019-02-28 DIAGNOSIS — M5136 Other intervertebral disc degeneration, lumbar region: Secondary | ICD-10-CM | POA: Diagnosis present

## 2019-02-28 DIAGNOSIS — I671 Cerebral aneurysm, nonruptured: Secondary | ICD-10-CM | POA: Diagnosis present

## 2019-02-28 DIAGNOSIS — I69054 Hemiplegia and hemiparesis following nontraumatic subarachnoid hemorrhage affecting left non-dominant side: Secondary | ICD-10-CM

## 2019-02-28 DIAGNOSIS — N400 Enlarged prostate without lower urinary tract symptoms: Secondary | ICD-10-CM | POA: Diagnosis present

## 2019-02-28 DIAGNOSIS — G8929 Other chronic pain: Secondary | ICD-10-CM | POA: Diagnosis present

## 2019-02-28 DIAGNOSIS — F17210 Nicotine dependence, cigarettes, uncomplicated: Secondary | ICD-10-CM | POA: Diagnosis present

## 2019-02-28 DIAGNOSIS — M21372 Foot drop, left foot: Secondary | ICD-10-CM | POA: Diagnosis present

## 2019-02-28 DIAGNOSIS — K219 Gastro-esophageal reflux disease without esophagitis: Secondary | ICD-10-CM | POA: Diagnosis present

## 2019-02-28 HISTORY — PX: CRANIOTOMY: SHX93

## 2019-02-28 LAB — COMPREHENSIVE METABOLIC PANEL
ALT: 15 U/L (ref 0–44)
AST: 20 U/L (ref 15–41)
Albumin: 3.1 g/dL — ABNORMAL LOW (ref 3.5–5.0)
Alkaline Phosphatase: 55 U/L (ref 38–126)
Anion gap: 8 (ref 5–15)
BUN: 14 mg/dL (ref 8–23)
CO2: 21 mmol/L — ABNORMAL LOW (ref 22–32)
Calcium: 8 mg/dL — ABNORMAL LOW (ref 8.9–10.3)
Chloride: 107 mmol/L (ref 98–111)
Creatinine, Ser: 1.04 mg/dL (ref 0.61–1.24)
GFR calc Af Amer: >60 mL/min (ref >60)
GFR calc non Af Amer: >60 mL/min (ref >60)
Glucose, Bld: 132 mg/dL — ABNORMAL HIGH (ref 70–99)
Potassium: 3.2 mmol/L — ABNORMAL LOW (ref 3.5–5.1)
Sodium: 136 mmol/L (ref 135–145)
Total Bilirubin: 0.6 mg/dL (ref 0.3–1.2)
Total Protein: 7.1 g/dL (ref 6.5–8.1)

## 2019-02-28 LAB — CBC
HCT: 36.1 % — ABNORMAL LOW (ref 39.0–52.0)
Hemoglobin: 11.6 g/dL — ABNORMAL LOW (ref 13.0–17.0)
MCH: 29 pg (ref 26.0–34.0)
MCHC: 32.1 g/dL (ref 30.0–36.0)
MCV: 90.3 fL (ref 80.0–100.0)
Platelets: 148 10*3/uL — ABNORMAL LOW (ref 150–400)
RBC: 4 MIL/uL — ABNORMAL LOW (ref 4.22–5.81)
RDW: 13.2 % (ref 11.5–15.5)
WBC: 6 10*3/uL (ref 4.0–10.5)
nRBC: 0 % (ref 0.0–0.2)

## 2019-02-28 LAB — MAGNESIUM: Magnesium: 1.4 mg/dL — ABNORMAL LOW (ref 1.7–2.4)

## 2019-02-28 LAB — PHOSPHORUS: Phosphorus: 2.7 mg/dL (ref 2.5–4.6)

## 2019-02-28 LAB — GLUCOSE, CAPILLARY: Glucose-Capillary: 106 mg/dL — ABNORMAL HIGH (ref 70–99)

## 2019-02-28 SURGERY — CRANIOTOMY INTRACRANIAL ANEURYSM FOR CAROTID
Anesthesia: General | Site: Head | Laterality: Right

## 2019-02-28 MED ORDER — SODIUM CHLORIDE 0.9 % IV SOLN: INTRAVENOUS | Status: DC | PRN

## 2019-02-28 MED ORDER — INDOCYANINE GREEN 25 MG IV SOLR
1.2500 mg | Freq: Once | INTRAVENOUS | Status: DC
  Filled 2019-02-28: qty 25

## 2019-02-28 MED ORDER — PROMETHAZINE HCL 25 MG PO TABS: 12.5000 mg | ORAL_TABLET | ORAL | Status: DC | PRN

## 2019-02-28 MED ORDER — DEXAMETHASONE SODIUM PHOSPHATE 10 MG/ML IJ SOLN
INTRAMUSCULAR | Status: AC
  Filled 2019-02-28: qty 2

## 2019-02-28 MED ORDER — THROMBIN 5000 UNITS EX SOLR
CUTANEOUS | Status: AC
  Filled 2019-02-28: qty 5000

## 2019-02-28 MED ORDER — PAPAVERINE HCL 30 MG/ML IJ SOLN
INTRAMUSCULAR | Status: DC | PRN
  Administered 2019-02-28: 2 mL

## 2019-02-28 MED ORDER — ACETAMINOPHEN 500 MG PO TABS
1000.0000 mg | ORAL_TABLET | Freq: Once | ORAL | Status: AC
  Administered 2019-02-28: 1000 mg via ORAL
  Filled 2019-02-28: qty 2

## 2019-02-28 MED ORDER — THROMBIN 20000 UNITS EX SOLR
CUTANEOUS | Status: AC
  Filled 2019-02-28: qty 20000

## 2019-02-28 MED ORDER — LIDOCAINE HCL 1 % IJ SOLN
INTRAMUSCULAR | Status: AC
  Filled 2019-02-28: qty 20

## 2019-02-28 MED ORDER — LORAZEPAM 2 MG/ML IJ SOLN: 1.0000 mg | INTRAMUSCULAR | Status: DC | PRN

## 2019-02-28 MED ORDER — LABETALOL HCL 5 MG/ML IV SOLN
INTRAVENOUS | Status: AC
  Filled 2019-02-28: qty 4

## 2019-02-28 MED ORDER — AMITRIPTYLINE HCL 25 MG PO TABS
25.0000 mg | ORAL_TABLET | Freq: Every day | ORAL | Status: DC
  Administered 2019-02-28 – 2019-03-01 (×2): 25 mg via ORAL
  Filled 2019-02-28 (×2): qty 1

## 2019-02-28 MED ORDER — POLYETHYLENE GLYCOL 3350 17 G PO PACK: 17.0000 g | PACK | Freq: Every day | ORAL | Status: DC | PRN

## 2019-02-28 MED ORDER — GABAPENTIN 600 MG PO TABS
600.0000 mg | ORAL_TABLET | Freq: Two times a day (BID) | ORAL | Status: DC | PRN
  Filled 2019-02-28: qty 1

## 2019-02-28 MED ORDER — DEXAMETHASONE SODIUM PHOSPHATE 10 MG/ML IJ SOLN
INTRAMUSCULAR | Status: AC
  Filled 2019-02-28: qty 1

## 2019-02-28 MED ORDER — LORAZEPAM 1 MG PO TABS: 1.0000 mg | ORAL_TABLET | ORAL | Status: DC | PRN

## 2019-02-28 MED ORDER — SODIUM CHLORIDE 0.9 % IV SOLN: INTRAVENOUS | Status: DC

## 2019-02-28 MED ORDER — DEXMEDETOMIDINE HCL IN NACL 400 MCG/100ML IV SOLN: 0.2000 ug/kg/h | INTRAVENOUS | Status: DC

## 2019-02-28 MED ORDER — BISACODYL 5 MG PO TBEC: 5.0000 mg | DELAYED_RELEASE_TABLET | Freq: Every day | ORAL | Status: DC | PRN

## 2019-02-28 MED ORDER — ONDANSETRON HCL 4 MG/2ML IJ SOLN
INTRAMUSCULAR | Status: AC
  Filled 2019-02-28: qty 2

## 2019-02-28 MED ORDER — THROMBIN 5000 UNITS EX SOLR
OROMUCOSAL | Status: DC | PRN
  Administered 2019-02-28 (×2): 5 mL via TOPICAL

## 2019-02-28 MED ORDER — SODIUM CHLORIDE 0.9 % IV SOLN
INTRAVENOUS | Status: DC | PRN
  Administered 2019-02-28: 13:00:00 35 ug/min via INTRAVENOUS

## 2019-02-28 MED ORDER — ESMOLOL HCL 100 MG/10ML IV SOLN
INTRAVENOUS | Status: AC
  Filled 2019-02-28: qty 10

## 2019-02-28 MED ORDER — ALBUMIN HUMAN 5 % IV SOLN: INTRAVENOUS | Status: DC | PRN

## 2019-02-28 MED ORDER — HEMOSTATIC AGENTS (NO CHARGE) OPTIME
TOPICAL | Status: DC | PRN
  Administered 2019-02-28: 1 via TOPICAL

## 2019-02-28 MED ORDER — SENNA 8.6 MG PO TABS
1.0000 | ORAL_TABLET | Freq: Two times a day (BID) | ORAL | Status: DC
  Administered 2019-02-28 – 2019-03-02 (×4): 8.6 mg via ORAL
  Filled 2019-02-28 (×4): qty 1

## 2019-02-28 MED ORDER — MIDAZOLAM HCL 2 MG/2ML IJ SOLN
INTRAMUSCULAR | Status: AC
  Filled 2019-02-28: qty 2

## 2019-02-28 MED ORDER — ESMOLOL HCL 100 MG/10ML IV SOLN
INTRAVENOUS | Status: DC | PRN
  Administered 2019-02-28: 20 mg via INTRAVENOUS
  Administered 2019-02-28: 30 mg via INTRAVENOUS

## 2019-02-28 MED ORDER — PROPOFOL 10 MG/ML IV BOLUS
INTRAVENOUS | Status: DC | PRN
  Administered 2019-02-28: 50 mg via INTRAVENOUS
  Administered 2019-02-28: 20 mg via INTRAVENOUS
  Administered 2019-02-28: 200 mg via INTRAVENOUS

## 2019-02-28 MED ORDER — HYDROCODONE-ACETAMINOPHEN 5-325 MG PO TABS
1.0000 | ORAL_TABLET | ORAL | Status: DC | PRN
  Administered 2019-02-28 – 2019-03-02 (×3): 1 via ORAL
  Filled 2019-02-28 (×3): qty 1

## 2019-02-28 MED ORDER — SUGAMMADEX SODIUM 200 MG/2ML IV SOLN
INTRAVENOUS | Status: DC | PRN
  Administered 2019-02-28: 200 mg via INTRAVENOUS

## 2019-02-28 MED ORDER — BACITRACIN ZINC 500 UNIT/GM EX OINT
TOPICAL_OINTMENT | CUTANEOUS | Status: AC
  Filled 2019-02-28: qty 28.35

## 2019-02-28 MED ORDER — CEFAZOLIN SODIUM-DEXTROSE 2-4 GM/100ML-% IV SOLN
2.0000 g | Freq: Three times a day (TID) | INTRAVENOUS | Status: AC
  Administered 2019-02-28 – 2019-03-01 (×2): 2 g via INTRAVENOUS
  Filled 2019-02-28 (×2): qty 100

## 2019-02-28 MED ORDER — CEFAZOLIN SODIUM-DEXTROSE 2-4 GM/100ML-% IV SOLN
2.0000 g | INTRAVENOUS | Status: DC
  Filled 2019-02-28: qty 100

## 2019-02-28 MED ORDER — ACETAMINOPHEN 325 MG PO TABS: 650.0000 mg | ORAL_TABLET | ORAL | Status: DC | PRN

## 2019-02-28 MED ORDER — BACITRACIN ZINC 500 UNIT/GM EX OINT
TOPICAL_OINTMENT | CUTANEOUS | Status: DC | PRN
  Administered 2019-02-28: 1 via TOPICAL

## 2019-02-28 MED ORDER — FLEET ENEMA 7-19 GM/118ML RE ENEM: 1.0000 | ENEMA | Freq: Once | RECTAL | Status: DC | PRN

## 2019-02-28 MED ORDER — ROCURONIUM BROMIDE 10 MG/ML (PF) SYRINGE
PREFILLED_SYRINGE | INTRAVENOUS | Status: AC
  Filled 2019-02-28: qty 20

## 2019-02-28 MED ORDER — LEVETIRACETAM IN NACL 1000 MG/100ML IV SOLN
1000.0000 mg | INTRAVENOUS | Status: AC
  Administered 2019-02-28: 1000 mg via INTRAVENOUS
  Filled 2019-02-28: qty 100

## 2019-02-28 MED ORDER — ONDANSETRON HCL 4 MG PO TABS: 4.0000 mg | ORAL_TABLET | ORAL | Status: DC | PRN

## 2019-02-28 MED ORDER — LIDOCAINE 2% (20 MG/ML) 5 ML SYRINGE
INTRAMUSCULAR | Status: DC | PRN
  Administered 2019-02-28: 60 mg via INTRAVENOUS

## 2019-02-28 MED ORDER — HYDRALAZINE HCL 20 MG/ML IJ SOLN
5.0000 mg | INTRAMUSCULAR | Status: DC | PRN
  Administered 2019-02-28 (×2): 20 mg via INTRAVENOUS
  Filled 2019-02-28: qty 1

## 2019-02-28 MED ORDER — AMLODIPINE BESYLATE 10 MG PO TABS
10.0000 mg | ORAL_TABLET | Freq: Every day | ORAL | Status: DC
  Administered 2019-03-01 – 2019-03-02 (×2): 10 mg via ORAL
  Filled 2019-02-28 (×2): qty 1

## 2019-02-28 MED ORDER — ACETAMINOPHEN 650 MG RE SUPP: 650.0000 mg | RECTAL | Status: DC | PRN

## 2019-02-28 MED ORDER — FENTANYL CITRATE (PF) 250 MCG/5ML IJ SOLN
INTRAMUSCULAR | Status: AC
  Filled 2019-02-28: qty 5

## 2019-02-28 MED ORDER — LIDOCAINE 2% (20 MG/ML) 5 ML SYRINGE
INTRAMUSCULAR | Status: AC
  Filled 2019-02-28: qty 5

## 2019-02-28 MED ORDER — PANTOPRAZOLE SODIUM 40 MG IV SOLR
40.0000 mg | Freq: Every day | INTRAVENOUS | Status: DC
  Administered 2019-02-28 – 2019-03-01 (×2): 40 mg via INTRAVENOUS
  Filled 2019-02-28 (×2): qty 40

## 2019-02-28 MED ORDER — DEXAMETHASONE SODIUM PHOSPHATE 10 MG/ML IJ SOLN
INTRAMUSCULAR | Status: DC | PRN
  Administered 2019-02-28: 10 mg via INTRAVENOUS

## 2019-02-28 MED ORDER — HYDROMORPHONE HCL 1 MG/ML IJ SOLN
0.5000 mg | INTRAMUSCULAR | Status: DC | PRN
  Administered 2019-03-01: 1 mg via INTRAVENOUS
  Filled 2019-02-28 (×2): qty 1

## 2019-02-28 MED ORDER — HYDROMORPHONE HCL 1 MG/ML IJ SOLN
INTRAMUSCULAR | Status: AC
  Administered 2019-02-28: 1 mg via INTRAVENOUS
  Filled 2019-02-28: qty 1

## 2019-02-28 MED ORDER — CHLORHEXIDINE GLUCONATE CLOTH 2 % EX PADS: 6.0000 | MEDICATED_PAD | Freq: Once | CUTANEOUS | Status: DC

## 2019-02-28 MED ORDER — THROMBIN 20000 UNITS EX SOLR
CUTANEOUS | Status: DC | PRN
  Administered 2019-02-28: 20 mL via TOPICAL

## 2019-02-28 MED ORDER — HYDRALAZINE HCL 20 MG/ML IJ SOLN
INTRAMUSCULAR | Status: AC
  Filled 2019-02-28: qty 1

## 2019-02-28 MED ORDER — FENTANYL CITRATE (PF) 100 MCG/2ML IJ SOLN
INTRAMUSCULAR | Status: DC | PRN
  Administered 2019-02-28: 25 ug via INTRAVENOUS
  Administered 2019-02-28: 100 ug via INTRAVENOUS
  Administered 2019-02-28 (×2): 50 ug via INTRAVENOUS

## 2019-02-28 MED ORDER — ONDANSETRON HCL 4 MG/2ML IJ SOLN: 4.0000 mg | INTRAMUSCULAR | Status: DC | PRN

## 2019-02-28 MED ORDER — 0.9 % SODIUM CHLORIDE (POUR BTL) OPTIME
TOPICAL | Status: DC | PRN
  Administered 2019-02-28 (×3): 1000 mL

## 2019-02-28 MED ORDER — ROCURONIUM BROMIDE 10 MG/ML (PF) SYRINGE
PREFILLED_SYRINGE | INTRAVENOUS | Status: AC
  Filled 2019-02-28: qty 10

## 2019-02-28 MED ORDER — PROPOFOL 10 MG/ML IV BOLUS
INTRAVENOUS | Status: AC
  Filled 2019-02-28: qty 40

## 2019-02-28 MED ORDER — ROCURONIUM BROMIDE 10 MG/ML (PF) SYRINGE
PREFILLED_SYRINGE | INTRAVENOUS | Status: DC | PRN
  Administered 2019-02-28: 20 mg via INTRAVENOUS
  Administered 2019-02-28: 10 mg via INTRAVENOUS
  Administered 2019-02-28: 60 mg via INTRAVENOUS
  Administered 2019-02-28: 30 mg via INTRAVENOUS

## 2019-02-28 MED ORDER — SODIUM CHLORIDE 0.9 % IV SOLN
INTRAVENOUS | Status: DC | PRN
  Administered 2019-02-28: 14:00:00 500 mL

## 2019-02-28 MED ORDER — PHENYLEPHRINE HCL (PRESSORS) 10 MG/ML IV SOLN
INTRAVENOUS | Status: DC | PRN
  Administered 2019-02-28: 80 ug via INTRAVENOUS
  Administered 2019-02-28: 40 ug via INTRAVENOUS

## 2019-02-28 MED ORDER — LIDOCAINE HCL (PF) 1 % IJ SOLN
INTRAMUSCULAR | Status: DC | PRN
  Administered 2019-02-28: 5 mL

## 2019-02-28 MED ORDER — CLEVIDIPINE BUTYRATE 0.5 MG/ML IV EMUL
1.0000 mg/h | INTRAVENOUS | Status: DC
  Filled 2019-02-28: qty 50

## 2019-02-28 MED ORDER — BUPIVACAINE HCL (PF) 0.5 % IJ SOLN
INTRAMUSCULAR | Status: AC
  Filled 2019-02-28: qty 30

## 2019-02-28 MED ORDER — BUPIVACAINE HCL 0.5 % IJ SOLN
INTRAMUSCULAR | Status: DC | PRN
  Administered 2019-02-28: 5 mL

## 2019-02-28 MED ORDER — CEFAZOLIN SODIUM-DEXTROSE 2-3 GM-%(50ML) IV SOLR
INTRAVENOUS | Status: DC | PRN
  Administered 2019-02-28: 2 g via INTRAVENOUS

## 2019-02-28 MED ORDER — NALOXONE HCL 0.4 MG/ML IJ SOLN: 0.0800 mg | INTRAMUSCULAR | Status: DC | PRN

## 2019-02-28 MED ORDER — ONDANSETRON HCL 4 MG/2ML IJ SOLN
INTRAMUSCULAR | Status: DC | PRN
  Administered 2019-02-28: 4 mg via INTRAVENOUS

## 2019-02-28 MED ORDER — LACTATED RINGERS IV SOLN: INTRAVENOUS | Status: DC

## 2019-02-28 MED ORDER — OXYCODONE HCL 5 MG PO TABS
5.0000 mg | ORAL_TABLET | ORAL | Status: DC | PRN
  Administered 2019-02-28 – 2019-03-02 (×6): 10 mg via ORAL
  Filled 2019-02-28 (×6): qty 2

## 2019-02-28 MED ORDER — LEVETIRACETAM IN NACL 500 MG/100ML IV SOLN
500.0000 mg | Freq: Two times a day (BID) | INTRAVENOUS | Status: DC
  Administered 2019-02-28 – 2019-03-02 (×4): 500 mg via INTRAVENOUS
  Filled 2019-02-28 (×4): qty 100

## 2019-02-28 MED ORDER — LABETALOL HCL 5 MG/ML IV SOLN
10.0000 mg | INTRAVENOUS | Status: DC | PRN
  Administered 2019-02-28: 20 mg via INTRAVENOUS

## 2019-02-28 MED ORDER — ADULT MULTIVITAMIN W/MINERALS CH
1.0000 | ORAL_TABLET | Freq: Every day | ORAL | Status: DC
  Administered 2019-03-01 – 2019-03-02 (×2): 1 via ORAL
  Filled 2019-02-28 (×2): qty 1

## 2019-02-28 SURGICAL SUPPLY — 114 items
APL SKNCLS STERI-STRIP NONHPOA (GAUZE/BANDAGES/DRESSINGS)
BAG DECANTER FOR FLEXI CONT (MISCELLANEOUS) ×3 IMPLANT
BAND INSRT 18 STRL LF DISP RB (MISCELLANEOUS) ×2
BAND RUBBER #18 3X1/16 STRL (MISCELLANEOUS) ×6 IMPLANT
BATTERY IQ STERILE (MISCELLANEOUS) ×2 IMPLANT
BENZOIN TINCTURE PRP APPL 2/3 (GAUZE/BANDAGES/DRESSINGS) IMPLANT
BIT DRILL WIRE PASS 1.3MM (BIT) IMPLANT
BLADE SAW GIGLI 16 STRL (MISCELLANEOUS) IMPLANT
BLADE SURG 15 STRL LF DISP TIS (BLADE) ×1 IMPLANT
BLADE SURG 15 STRL SS (BLADE)
BNDG CMPR 75X41 PLY ABS (GAUZE/BANDAGES/DRESSINGS) ×1
BNDG CMPR 75X41 PLY HI ABS (GAUZE/BANDAGES/DRESSINGS)
BNDG GAUZE ELAST 4 BULKY (GAUZE/BANDAGES/DRESSINGS) ×4 IMPLANT
BNDG STRETCH 4X75 NS LF (GAUZE/BANDAGES/DRESSINGS) ×2 IMPLANT
BNDG STRETCH 4X75 STRL LF (GAUZE/BANDAGES/DRESSINGS) IMPLANT
BTRY SRG DRVR 1.5 IQ (MISCELLANEOUS) ×1
BUR ACORN 6.0 PRECISION (BURR) IMPLANT
BUR ACORN 6.0MM PRECISION (BURR)
BUR BARREL STRAIGHT FLUTE 4.0 (BURR) ×2 IMPLANT
BUR MATCHSTICK NEURO 3.0 LAGG (BURR) IMPLANT
BUR ROUND FLUTED 4 SOFT TCH (BURR) ×1 IMPLANT
BUR ROUND FLUTED 4MM SOFT TCH (BURR) ×1
BUR ROUND FLUTED 5 RND (BURR) ×1 IMPLANT
BUR ROUND FLUTED 5MM RND (BURR) ×1
BUR SPIRAL ROUTER 2.3 (BUR) ×1 IMPLANT
BUR SPIRAL ROUTER 2.3MM (BUR) ×1
CANISTER SUCT 3000ML PPV (MISCELLANEOUS) ×7 IMPLANT
CARTRIDGE OIL MAESTRO DRILL (MISCELLANEOUS) ×1 IMPLANT
CLIP ANEURY TI PERM STD ANG 7M (Clip) ×2 IMPLANT
CLIP VESOCCLUDE MED 6/CT (CLIP) IMPLANT
COVER MAYO STAND STRL (DRAPES) IMPLANT
COVER WAND RF STERILE (DRAPES) ×3 IMPLANT
DECANTER SPIKE VIAL GLASS SM (MISCELLANEOUS) ×3 IMPLANT
DIFFUSER DRILL AIR PNEUMATIC (MISCELLANEOUS) ×3 IMPLANT
DRAPE MICROSCOPE LEICA (MISCELLANEOUS) ×3 IMPLANT
DRAPE NEUROLOGICAL W/INCISE (DRAPES) ×3 IMPLANT
DRAPE WARM FLUID 44X44 (DRAPES) ×3 IMPLANT
DRILL WIRE PASS 1.3MM (BIT)
DRSG ADAPTIC 3X8 NADH LF (GAUZE/BANDAGES/DRESSINGS) ×1 IMPLANT
DRSG TELFA 3X8 NADH (GAUZE/BANDAGES/DRESSINGS) ×3 IMPLANT
DURAPREP 6ML APPLICATOR 50/CS (WOUND CARE) ×3 IMPLANT
ELECT REM PT RETURN 9FT ADLT (ELECTROSURGICAL) ×3
ELECTRODE REM PT RTRN 9FT ADLT (ELECTROSURGICAL) ×1 IMPLANT
EVACUATOR SILICONE 100CC (DRAIN) IMPLANT
FORCEPS BIPOLAR SPETZLER 8 1.0 (NEUROSURGERY SUPPLIES) ×2 IMPLANT
GAUZE 4X4 16PLY RFD (DISPOSABLE) IMPLANT
GAUZE SPONGE 4X4 12PLY STRL (GAUZE/BANDAGES/DRESSINGS) IMPLANT
GLOVE BIO SURGEON STRL SZ7.5 (GLOVE) ×4 IMPLANT
GLOVE BIOGEL PI IND STRL 7.0 (GLOVE) IMPLANT
GLOVE BIOGEL PI IND STRL 7.5 (GLOVE) ×2 IMPLANT
GLOVE BIOGEL PI INDICATOR 7.0 (GLOVE) ×4
GLOVE BIOGEL PI INDICATOR 7.5 (GLOVE) ×10
GLOVE ECLIPSE 7.0 STRL STRAW (GLOVE) ×6 IMPLANT
GLOVE SURG SS PI 7.0 STRL IVOR (GLOVE) ×6 IMPLANT
GOWN STRL REUS W/ TWL LRG LVL3 (GOWN DISPOSABLE) ×2 IMPLANT
GOWN STRL REUS W/ TWL XL LVL3 (GOWN DISPOSABLE) IMPLANT
GOWN STRL REUS W/TWL 2XL LVL3 (GOWN DISPOSABLE) IMPLANT
GOWN STRL REUS W/TWL LRG LVL3 (GOWN DISPOSABLE) ×9
GOWN STRL REUS W/TWL XL LVL3 (GOWN DISPOSABLE) ×6
GRAFT DURAGEN MATRIX 3WX3L (Graft) ×3 IMPLANT
GRAFT DURAGEN MATRIX 3X3 SNGL (Graft) IMPLANT
HEMOSTAT POWDER KIT SURGIFOAM (HEMOSTASIS) ×4 IMPLANT
HEMOSTAT SURGICEL 2X14 (HEMOSTASIS) ×1 IMPLANT
HOOK DURA (MISCELLANEOUS) ×3 IMPLANT
HOOK DURA 1/2IN (MISCELLANEOUS) ×1 IMPLANT
KIT BASIN OR (CUSTOM PROCEDURE TRAY) ×3 IMPLANT
KIT DRAIN CSF ACCUDRAIN (MISCELLANEOUS) IMPLANT
KIT TURNOVER KIT B (KITS) ×3 IMPLANT
KNIFE ARACHNOID DISP AM-24-S (MISCELLANEOUS) ×2 IMPLANT
NDL HYPO 18GX1.5 BLUNT FILL (NEEDLE) IMPLANT
NDL HYPO 25X1 1.5 SAFETY (NEEDLE) ×1 IMPLANT
NDL SPNL 25GX3.5 QUINCKE BL (NEEDLE) IMPLANT
NEEDLE HYPO 18GX1.5 BLUNT FILL (NEEDLE) ×3 IMPLANT
NEEDLE HYPO 22GX1.5 SAFETY (NEEDLE) IMPLANT
NEEDLE HYPO 25X1 1.5 SAFETY (NEEDLE) ×3 IMPLANT
NEEDLE SPNL 25GX3.5 QUINCKE BL (NEEDLE) IMPLANT
NS IRRIG 1000ML POUR BTL (IV SOLUTION) ×7 IMPLANT
OIL CARTRIDGE MAESTRO DRILL (MISCELLANEOUS) ×3
PACK CRANIOTOMY CUSTOM (CUSTOM PROCEDURE TRAY) ×3 IMPLANT
PAD ARMBOARD 7.5X6 YLW CONV (MISCELLANEOUS) ×3 IMPLANT
PAD DRESSING TELFA 3X8 NADH (GAUZE/BANDAGES/DRESSINGS) ×1 IMPLANT
PATTIES SURGICAL .25X.25 (GAUZE/BANDAGES/DRESSINGS) IMPLANT
PATTIES SURGICAL .5 X.5 (GAUZE/BANDAGES/DRESSINGS) IMPLANT
PATTIES SURGICAL .5 X3 (DISPOSABLE) IMPLANT
PATTIES SURGICAL 1/4 X 3 (GAUZE/BANDAGES/DRESSINGS) IMPLANT
PATTIES SURGICAL 1X1 (DISPOSABLE) IMPLANT
PERFORATOR LRG  14-11MM (BIT)
PERFORATOR LRG 14-11MM (BIT) IMPLANT
PIN MAYFIELD SKULL DISP (PIN) ×2 IMPLANT
PLATE 1.5  2HOLE LNG NEURO (Plate) ×2 IMPLANT
PLATE 1.5 2HOLE LNG NEURO (Plate) IMPLANT
PLATE 1.5 4HOLE LONG STRAIGHT (Plate) ×2 IMPLANT
PLATE 1.5/0.5 13MM BURR HOLE (Plate) ×2 IMPLANT
SCREW SELF DRILL HT 1.5/4MM (Screw) ×16 IMPLANT
SPONGE NEURO XRAY DETECT 1X3 (DISPOSABLE) IMPLANT
SPONGE SURGIFOAM ABS GEL 100 (HEMOSTASIS) ×3 IMPLANT
SPONGE SURGIFOAM ABS GEL SZ50 (HEMOSTASIS) ×2 IMPLANT
STAPLER VISISTAT 35W (STAPLE) ×3 IMPLANT
STOCKINETTE 6  STRL (DRAPES) ×2
STOCKINETTE 6 STRL (DRAPES) ×1 IMPLANT
SUT ETHILON 3 0 FSL (SUTURE) IMPLANT
SUT NURALON 4 0 TR CR/8 (SUTURE) ×6 IMPLANT
SUT VIC AB 0 CT1 18XCR BRD8 (SUTURE) ×2 IMPLANT
SUT VIC AB 0 CT1 8-18 (SUTURE) ×6
SUT VIC AB 3-0 SH 8-18 (SUTURE) ×5 IMPLANT
SYR 3ML LL SCALE MARK (SYRINGE) ×2 IMPLANT
TAPE CLOTH 1X10 TAN NS (GAUZE/BANDAGES/DRESSINGS) ×1 IMPLANT
TIP NONSTICK .5MMX23CM (INSTRUMENTS)
TIP NONSTICK .5X23 (INSTRUMENTS) IMPLANT
TOWEL GREEN STERILE (TOWEL DISPOSABLE) ×3 IMPLANT
TOWEL GREEN STERILE FF (TOWEL DISPOSABLE) ×3 IMPLANT
TRAY FOLEY MTR SLVR 16FR STAT (SET/KITS/TRAYS/PACK) ×2 IMPLANT
UNDERPAD 30X30 (UNDERPADS AND DIAPERS) IMPLANT
WATER STERILE IRR 1000ML POUR (IV SOLUTION) ×3 IMPLANT

## 2019-02-28 NOTE — Op Note (Signed)
NEUROSURGERY OPERATIVE NOTE   PREOP DIAGNOSIS:  Right ICA terminus aneurysm, recurrent   POSTOP DIAGNOSIS: Same  PROCEDURE: Right frontotemporal craniotomy for clipping of internal carotid artery aneurysm Use of intraoperative microscope for microdissection Intraoperative ICG video angiography  SURGEON: Dr. [name], MD  ASSISTANT: Dr. Ashok Pall, MD  ANESTHESIA: General Endotracheal  EBL: 200cc  SPECIMENS: None  DRAINS: None  COMPLICATIONS: None immediate  CONDITION: Hemodynamically stable to PACU  HISTORY: Brian [name] is a 74 y.o. male who initially presented with subarachnoid hemorrhage approximately 5 years ago.  At that time he underwent coil embolization of a right internal carotid artery terminus aneurysm.  He made an excellent recovery but was lost to follow-up.  He underwent follow-up angiogram 2 months ago which demonstrated recurrence of the previously coiled aneurysm.  Given the location at the ICA terminus he was not a good candidate for flow diversion or repeat coiling.  We therefore discussed surgical clip ligation.  He elected to proceed with surgery.  The risks and benefits of the surgery were reviewed in detail with both the patient and his daughter.  After all questions were answered informed consent was obtained and witnessed.  PROCEDURE IN DETAIL: The patient was brought to the operating room. After induction of general anesthesia, the patient was positioned on the operative table in the [place] head holder in the supine position. All pressure points were meticulously padded.  Standard frontotemporal skin incision was then marked out and prepped and draped in the usual sterile fashion.  After timeout was conducted, the incision was infiltrated with local anesthetic without epinephrine.  Incision was then made sharply and carried down through the galea.  Raney clips were applied for hemostasis on the skin edges.  The Bovie electrocautery was  then used to incise the temporalis muscle and fascia.  A single piece myocutaneous flap was then elevated and retracted anteriorly.  The supraorbital nerve was identified.  A bur hole was created in the pterion, along the superior temporal line, and just above the root of the zygoma.  A single piece frontotemporal flap was then elevated and removed.  Hemostasis on the epidural plane was achieved with bipolar electrocautery.  The high-speed drill was then used to drill down the lesser wing of the sphenoid.  The dura was then opened in curvilinear fashion and tacked up with 4 Nurolon stitches.  The microscope was then draped and brought into the field, and the remainder of the case was done under the microscope using microdissection.  Initially, a subfrontal approach was utilized to identify the optic nerve.  Arachnoid overlying the optic nerve was then dissected and dissection was carried laterally to identify the internal carotid artery.  We got good egress of CSF which allowed significant brain relaxation.  The proximal portion of the supraclinoid internal carotid artery was dissected.  In order to further dissect the supraclinoid internal carotid artery which was noted to be of significant length, it required dissection of the proximal portion of the sylvian fissure.  Once this was completed, I was then further able to dissect the internal carotid artery until the ICA bifurcation was identified.  I was able to identify the proximal portion of the M1 and the A1.  At the level of the ICA bifurcation the aneurysm neck was identified.  Multiple small lenticulostriate vessels were noted coursing along the neck of the aneurysm which were carefully dissected away from the neck.  The previously placed coils were identified in the dome of  the aneurysm.  At this point, a angled titanium clip was selected and placed across the neck of the aneurysm taking care to prevent application of the clip across any of the small  lenticulostriate perforating vessels.  Once the aneurysm clip was placed, inspection revealed no perforators within the aneurysm clip.  Further dissection of the dome allowed slightly more visualization of the more distal clip blades.  Again, I was unable to identify any perforators caught within the clip blades.  At this point, the anesthesia service administered a bolus of 12.5 mg of indocyanine green.  Video angiography confirmed patency of the internal carotid artery, proximal M1, A1, and no flow within the aneurysm dome.  Small lenticulostriate vessels were also noted to be patent.  Small pieces of Gelfoam were then soaked in papaverine and used to cover the small lenticulostriate vessels as well as the proximal A1, M1, and supraclinoid ICA.  Hemostasis was then secured on the brain surface using a combination of bipolar electrocautery and morselized Gelfoam with thrombin.  The dura was then reapproximated with interrupted 4 Nurolon stitches.  A layer of DuraGen collagen onlay graft was placed.  Bone flap was then replaced and plated with standard titanium plates and screws.  The wound was again irrigated with copious amounts of normal saline irrigation.  The temporalis muscle and fascia were closed with interrupted 0 Vicryl stitches.  The galea was closed with interrupted 3-0 Vicryl stitches.  Skin was closed with staples.  Bacitracin ointment and sterile dressing was applied.  At the end of the case all sponge, needle, instrument, and cottonoid counts were correct.  The patient was then transferred to the stretcher, extubated, and taken to the postanesthesia care unit in stable hemodynamic condition.

## 2019-02-28 NOTE — Anesthesia Preprocedure Evaluation (Addendum)
Anesthesia Evaluation  Patient identified by MRN, date of birth, ID band Patient awake    Reviewed: Allergy & Precautions, NPO status , Patient's Chart, lab work & pertinent test results  Airway Mallampati: III  TM Distance: >3 FB Neck ROM: Full    Dental  (+) Edentulous Upper, Missing   Pulmonary Current SmokerPatient did not abstain from smoking.,    Pulmonary exam normal breath sounds clear to auscultation       Cardiovascular hypertension, Pt. on medications + DVT  Normal cardiovascular exam Rhythm:Regular Rate:Normal  ECG: SR, rate 96  ECHO: 1. The left ventricle has a visually estimated ejection fraction of approximately 55%. The cavity size was normal. There is moderately increased left ventricular wall thickness. Left ventricular diastolic Doppler parameters are consistent with impaired  relaxation. No evidence of left ventricular regional wall motion abnormalities. 2. The right ventricle has normal systolic function. The cavity was normal. There is no increase in right ventricular wall thickness. Right ventricular systolic pressure could not be assessed. 3. The aortic valve is tricuspid. Moderate aortic annular calcification noted. 4. The mitral valve is grossly normal. There is mild mitral annular calcification present. 5. The tricuspid valve is grossly normal. 6. The aortic root is normal in size and structure. 7. There is no obvious interatrial shunting with saline bubble contrast.   Neuro/Psych Left foot drop Left sided weakness  Neuromuscular disease CVA, Residual Symptoms negative psych ROS   GI/Hepatic negative GI ROS, Neg liver ROS,   Endo/Other  negative endocrine ROS  Renal/GU negative Renal ROS     Musculoskeletal Chronic pain   Abdominal   Peds  Hematology negative hematology ROS (+)   Anesthesia Other Findings CEREBRAL ANEURYSM, NONRUPTURED  Reproductive/Obstetrics                             Anesthesia Physical Anesthesia Plan  ASA: III  Anesthesia Plan: General   Post-op Pain Management:    Induction: Intravenous  PONV Risk Score and Plan: 1 and Dexamethasone, Ondansetron and Treatment may vary due to age or medical condition  Airway Management Planned: Oral ETT  Additional Equipment: Arterial line  Intra-op Plan:   Post-operative Plan: Extubation in OR  Informed Consent: I have reviewed the patients History and Physical, chart, labs and discussed the procedure including the risks, benefits and alternatives for the proposed anesthesia with the patient or authorized representative who has indicated his/her understanding and acceptance.     Dental advisory given  Plan Discussed with: CRNA  Anesthesia Plan Comments:        Anesthesia Quick Evaluation
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
## 2019-02-28 NOTE — Transfer of Care (Signed)
Immediate Anesthesia Transfer of Care Note  Patient: Brian [name]  Procedure(s) Performed: RIGHT CRANIOTOMY FOR CLIPPING OF INTRACRANIAL ANEURYSM FOR CAROTID (Right Head)  Patient Location: PACU  Anesthesia Type:General  Level of Consciousness: lethargic and responds to stimulation  Airway & Oxygen Therapy: Patient Spontanous Breathing  Post-op Assessment: Report given to RN and moving extremities on right, had pre-op weakness on left  Post vital signs: Reviewed and stable  Last Vitals:  Vitals Value Taken Time  BP 161/109 02/28/19 1552  Temp    Pulse 94 02/28/19 1554  Resp 17 02/28/19 1554  SpO2 93 % 02/28/19 1554  Vitals shown include unvalidated device data.  Last Pain:  Vitals:   02/28/19 1137  TempSrc:   PainSc: 0-No pain      Patients Stated Pain Goal: 5 ([id number])  Complications: No apparent anesthesia complications

## 2019-02-28 NOTE — Anesthesia Procedure Notes (Signed)
Arterial Line Insertion Start/End1/08/2019 12:10 PM, 02/28/2019 12:40 PM Performed by: Leonor Liv, CRNA, CRNA  Patient location: Pre-op. Preanesthetic checklist: patient identified, IV checked, site marked, risks and benefits discussed, surgical consent, monitors and equipment checked, pre-op evaluation, timeout performed and anesthesia consent Lidocaine 1% used for infiltration Left, radial was placed Catheter size: 20 G Hand hygiene performed , maximum sterile barriers used  and Seldinger technique used Allen's test indicative of satisfactory collateral circulation Attempts: 2 Procedure performed without using ultrasound guided technique. Following insertion, dressing applied and Biopatch. Post procedure assessment: normal  Patient tolerated the procedure well with no immediate complications.

## 2019-02-28 NOTE — Anesthesia Procedure Notes (Signed)
Procedure Name: Intubation Date/Time: 02/28/2019 12:55 PM Performed by: Leonor Liv, CRNA Pre-anesthesia Checklist: Patient identified, Emergency Drugs available, Suction available and Patient being monitored Patient Re-evaluated:Patient Re-evaluated prior to induction Oxygen Delivery Method: Circle system utilized Preoxygenation: Pre-oxygenation with 100% oxygen Induction Type: IV induction Ventilation: Mask ventilation without difficulty Laryngoscope Size: Mac and 4 Grade View: Grade I Tube type: Oral Tube size: 7.5 mm Number of attempts: 1 Airway Equipment and Method: Stylet Placement Confirmation: ETT inserted through vocal cords under direct vision,  positive ETCO2,  CO2 detector and breath sounds checked- equal and bilateral Secured at: 22 cm Tube secured with: Tape Dental Injury: Teeth and Oropharynx as per pre-operative assessment

## 2019-03-01 ENCOUNTER — Encounter: Payer: Self-pay | Admitting: *Deleted

## 2019-03-01 LAB — BASIC METABOLIC PANEL
Anion gap: 10 (ref 5–15)
BUN: 15 mg/dL (ref 8–23)
CO2: 20 mmol/L — ABNORMAL LOW (ref 22–32)
Calcium: 8.1 mg/dL — ABNORMAL LOW (ref 8.9–10.3)
Chloride: 103 mmol/L (ref 98–111)
Creatinine, Ser: 1.17 mg/dL (ref 0.61–1.24)
GFR calc Af Amer: >60 mL/min (ref >60)
GFR calc non Af Amer: >60 mL/min (ref >60)
Glucose, Bld: 115 mg/dL — ABNORMAL HIGH (ref 70–99)
Potassium: 4.3 mmol/L (ref 3.5–5.1)
Sodium: 133 mmol/L — ABNORMAL LOW (ref 135–145)

## 2019-03-01 LAB — CBC
HCT: 35.4 % — ABNORMAL LOW (ref 39.0–52.0)
Hemoglobin: 11.2 g/dL — ABNORMAL LOW (ref 13.0–17.0)
MCH: 28.4 pg (ref 26.0–34.0)
MCHC: 31.6 g/dL (ref 30.0–36.0)
MCV: 89.8 fL (ref 80.0–100.0)
Platelets: 161 10*3/uL (ref 150–400)
RBC: 3.94 MIL/uL — ABNORMAL LOW (ref 4.22–5.81)
RDW: 13.4 % (ref 11.5–15.5)
WBC: 10.1 10*3/uL (ref 4.0–10.5)
nRBC: 0 % (ref 0.0–0.2)

## 2019-03-01 MED ORDER — CHLORHEXIDINE GLUCONATE CLOTH 2 % EX PADS
6.0000 | MEDICATED_PAD | Freq: Every day | CUTANEOUS | Status: DC
  Administered 2019-03-01 – 2019-03-02 (×2): 6 via TOPICAL

## 2019-03-01 NOTE — Progress Notes (Signed)
  NEUROSURGERY PROGRESS NOTE   No issues overnight.  Complains of severe right sided HA/periorbital pain Endorses blurry vision, but normal for him No new N/T/W  EXAM:  BP 138/87   Pulse (!) 116   Temp 97.9 F (36.6 C) (Oral)   Resp 19   Ht 5' 7.5" (1.715 m)   Wt 69.9 kg   SpO2 97%   BMI 23.76 kg/m   Awake, alert, oriented  Speech fluent, appropriate  CN grossly intact  5/5 RUE/RLE, 4/4- LUE/LLE  Bandage in place  IMPRESSION/PLAN 74 y.o. male pod #1 right craniotomy for clipping right ICA terminus aneurysm. Neurologically at baseline with left hemiparesis.   - continue supportive care, keppra  - with history of polysubstance abuse, would use IV pain meds as a last resort. CIWA   - okay to d/c foley, aline  - ok to transfer to step down

## 2019-03-01 NOTE — Anesthesia Postprocedure Evaluation (Signed)
Anesthesia Post Note  Patient: NELVIN MCCOWAN  Procedure(s) Performed: RIGHT CRANIOTOMY FOR CLIPPING OF INTRACRANIAL ANEURYSM FOR CAROTID (Right Head)     Patient location during evaluation: PACU Anesthesia Type: General Level of consciousness: awake Pain management: pain level controlled Vital Signs Assessment: post-procedure vital signs reviewed and stable Respiratory status: spontaneous breathing, nonlabored ventilation, respiratory function stable and patient connected to nasal cannula oxygen Cardiovascular status: blood pressure returned to baseline and stable Postop Assessment: no apparent nausea or vomiting Anesthetic complications: no    Last Vitals:  Vitals:   03/01/19 1700 03/01/19 1800  BP: 117/75 119/81  Pulse: (!) 108 (!) 108  Resp: (!) 21 (!) 21  Temp:    SpO2: 93% 93%    Last Pain:  Vitals:   03/01/19 1600  TempSrc: Oral  PainSc: 5                  Rilen Shukla P Sierra Bissonette

## 2019-03-01 NOTE — Evaluation (Signed)
Physical Therapy Evaluation Patient Details Name: Brian [name] MRN: [id number] DOB: 1945-04-24 Today's Date: 03/01/2019   History of Present Illness  74 y.o. 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 PMH: drug abuse, HTN, renal failure, anemia, colitis, chronic pain, DVT, LE weakness, L sided weakness following stroke  Clinical Impression  Pt admitted for above. He has deficits in strength, balance, ROM, pain, gait, and overall functional mobility. He demonstrated greater L sided weakness as compared to the R during functional activity. He was able to complete bed mobility with min guard/min assist and transfers mod assist +2 for safety. Pt lethargic throughout but followed commands well. Cues for hand placement and safety as pt was impulsive with movement, particularly with sit to stands. He stated that his aide is at his home Mon-Sat 1-7pm, and that he sometimes gets up on his own. He states that he had a L AFO but lost it. Contacted MD to please place new order for one, due to L ankle dorsiflexion weakness. Called daughter, Brian [name], on the phone to discuss today's session and available support at home. She stated that pt's son is there and can help if available, but she was unsure of his working schedule. Pt does not seem to be at baseline in terms of functional mobility, so recommend SNF upon discharge to continue working on impairments. Pt agreeable to SNF today but lethargic. If pt refuses SNF, recommend HH PT, OT, and additional aide support during the week. Pt would benefit from continued skilled PT intervention in order to address deficits.   Vitals: Start of session:101 bpm, 95%, 111/76 mmHg End of session: 110 bpm, 95%, 105/67 mmHg    Follow Up Recommendations SNF;Supervision for mobility/OOB    Equipment Recommendations  None recommended by PT    Recommendations for Other Services OT consult      Precautions / Restrictions Precautions Precautions: Fall Restrictions Weight Bearing Restrictions: No      Mobility  Bed Mobility Overal bed mobility: Needs Assistance Bed Mobility: Supine to Sit     Supine to sit: Min guard;Min assist;HOB elevated     General bed mobility comments: initially min assist for trunk righting, but pt wanted to lay down again; next attempt, he was able to right himself without physical assistance but min guard for line management  Transfers Overall transfer level: Needs assistance Equipment used: None Transfers: Stand Pivot Transfers;Sit to/from Stand Sit to Stand: +2 physical assistance;Min guard;Min assist Stand pivot transfers: Mod assist;+2 physical assistance       General transfer comment: min guard/min assist for power up to stand, 2 attempts, first min guard and then 2nd min assist for power up; stand pivot mod A +2 for line management, guiding to chair, helping maintain upright posture and avoiding B knee buckling  Ambulation/Gait                Stairs            Wheelchair Mobility    Modified Rankin (Stroke Patients Only)       Balance Overall balance assessment: Needs assistance Sitting-balance support: Single extremity supported;Feet unsupported Sitting balance-Leahy Scale: Fair Sitting balance - Comments: able to maintain sitting balance with R UE on mattress   Standing balance support: During functional activity Standing balance-Leahy Scale: Poor Standing balance comment: pt with very flexed posture during standing, generalized LE weakness  Pertinent Vitals/Pain Pain Assessment: Faces Faces Pain Scale: Hurts even more Pain Location: R eye Pain Descriptors / Indicators: Aching;Constant;Headache;Grimacing;Pressure;Throbbing Pain Intervention(s): Limited activity within patient's tolerance;Monitored during session;Repositioned;Other (comment)(cool washcloth applied)     Home Living Family/patient expects to be discharged to:: Private residence Living Arrangements: Children Available Help at Discharge: Family;Personal care attendant;Available 24 hours/day Type of Home: Mobile home Home Access: Stairs to enter Entrance Stairs-Rails: Left Entrance Stairs-Number of Steps: 2 Home Layout: One level Home Equipment: Latina Craver - 2 wheels;Wheelchair - manual Additional Comments: M-Sat 1-7pm aide; son works but pt states he does not help with mobility; can get up by himself at home but unsteady; states he will stay in bed until aide gets there to help    Prior Function Level of Independence: Needs assistance   Gait / Transfers Assistance Needed: occasional household ambulator with quad cane or furniture surfing; aide helps him with mobility from 1-7pm  ADL's / Homemaking Assistance Needed: aide Mon-Sat 1-7pm; son available at night        Hand Dominance   Dominant Hand: Right    Extremity/Trunk Assessment   Upper Extremity Assessment Upper Extremity Assessment: Generalized weakness;LUE deficits/detail LUE Deficits / Details: L sided weakness following stroke; remained in flexed position throughout session LUE Coordination: decreased gross motor    Lower Extremity Assessment Lower Extremity Assessment: LLE deficits/detail LLE Deficits / Details: L knee extension 3+/5, L ankle dorsiflexion 3-/5.  LLE Sensation: WNL       Communication   Communication: No difficulties  Cognition Arousal/Alertness: Lethargic Behavior During Therapy: Flat affect;WFL for tasks assessed/performed Overall Cognitive Status: Within Functional Limits for tasks assessed                                 General Comments: pt followed commands throughout session, a little impulsive with movement but stopped when asked; a little lethargic throughout      General Comments      Exercises     Assessment/Plan    PT Assessment Patient needs  continued PT services  PT Problem List Decreased strength;Decreased range of motion;Decreased activity tolerance;Decreased balance;Decreased mobility;Decreased knowledge of use of DME;Decreased safety awareness;Pain;Decreased coordination       PT Treatment Interventions DME instruction;Gait training;Stair training;Functional mobility training;Therapeutic activities;Therapeutic exercise;Balance training;Neuromuscular re-education;Patient/family education;Modalities;Wheelchair mobility training    PT Goals (Current goals can be found in the Care Plan section)  Acute Rehab PT Goals Patient Stated Goal: go home PT Goal Formulation: With patient Time For Goal Achievement: 03/15/19 Potential to Achieve Goals: Good    Frequency Min 3X/week   Barriers to discharge        Co-evaluation               AM-PAC PT "6 Clicks" Mobility  Outcome Measure Help needed turning from your back to your side while in a flat bed without using bedrails?: None Help needed moving from lying on your back to sitting on the side of a flat bed without using bedrails?: None Help needed moving to and from a bed to a chair (including a wheelchair)?: A Lot Help needed standing up from a chair using your arms (e.g., wheelchair or bedside chair)?: A Little Help needed to walk in hospital room?: A Lot Help needed climbing 3-5 steps with a railing? : Total 6 Click Score: 16    End of Session   Activity Tolerance: Patient limited by lethargy Patient left: in  chair;with call bell/phone within reach;with chair alarm set Nurse Communication: Mobility status PT Visit Diagnosis: Unsteadiness on feet (R26.81);Other abnormalities of gait and mobility (R26.89);Muscle weakness (generalized) (M62.81);Pain;Other symptoms and signs involving the nervous system (R29.898) Pain - Right/Left: Right Pain - part of body: (eye)    Time: [id number] PT Time Calculation (min) (ACUTE ONLY): 28 min   Charges:   PT Evaluation $PT  Eval Moderate Complexity: 1 Mod PT Treatments $Therapeutic Activity: 8-22 mins        Christel Mormon, SPT   Khyler Eschmann 03/01/2019, 10:09 AM

## 2019-03-02 MED ORDER — PANTOPRAZOLE SODIUM 40 MG PO TBEC: 40.0000 mg | DELAYED_RELEASE_TABLET | Freq: Every day | ORAL | Status: DC

## 2019-03-02 MED ORDER — LEVETIRACETAM 500 MG PO TABS
500.0000 mg | ORAL_TABLET | Freq: Two times a day (BID) | ORAL | Status: DC
  Administered 2019-03-02: 500 mg via ORAL
  Filled 2019-03-02: qty 1

## 2019-03-02 MED ORDER — OXYCODONE-ACETAMINOPHEN 7.5-325 MG PO TABS: 1.0000 | ORAL_TABLET | ORAL | 0 refills | Status: DC | PRN

## 2019-03-02 NOTE — Plan of Care (Signed)
Patient is able to void without issue

## 2019-03-02 NOTE — Discharge Summary (Signed)
Physician Discharge Summary  Patient ID: Brian [name] MRN: [id number] DOB/AGE: [birth year] 74 y.o.  Admit date: 02/28/2019 Discharge date: 03/02/2019  Admission Diagnoses:  Unruptured cerebral aneurysm   Discharge Diagnoses:  Same Active Problems:   S/P craniotomy   Unruptured cerebral aneurysm   Discharged Condition: Stable  Hospital Course:  Brian [name] is a 74 y.o. male who was admitted for the below procedure. There were no post operative complications. At time of discharge, pain was well controlled, ambulating with Pt/OT who rec SNF but patient declined and preferred HH, tolerating po, voiding normal. Ready for discharge.  Treatments: Surgery 1. Right frontotemporal craniotomy for clipping of internal carotid artery aneurysm 2. Use of intraoperative microscope for microdissection 3. Intraoperative ICG video angiography  Discharge Exam: Blood pressure (!) 140/92, pulse (!) 112, temperature 98.9 F (37.2 C), temperature source Oral, resp. rate (!) 22, height 5' 7.5" (1.715 m), weight 69.9 kg, SpO2 98 %. Awake, alert, oriented Speech fluent, appropriate CN grossly intact Left hemiparesis stable, right grossly normal Wound c/d/i  Disposition: Discharge disposition: 01-Home or Self Care       Discharge Instructions    Call MD for:  difficulty breathing, headache or visual disturbances   Complete by: As directed    Call MD for:  persistant dizziness or light-headedness   Complete by: As directed    Call MD for:  redness, tenderness, or signs of infection (pain, swelling, redness, odor or green/yellow discharge around incision site)   Complete by: As directed    Call MD for:  severe uncontrolled pain   Complete by: As directed    Call MD for:  temperature >100.4   Complete by: As directed    Diet general   Complete by: As directed    Driving Restrictions   Complete by: As directed    Do not drive until given clearance.   Increase activity slowly   Complete  by: As directed    Lifting restrictions   Complete by: As directed    Do not lift anything >10lbs. Avoid bending and twisting in awkward positions. Avoid bending at the back.   May shower / Bathe   Complete by: As directed    In 24 hours. Okay to wash wound with warm soapy water. Avoid scrubbing the wound. Pat dry.   Remove dressing in 24 hours   Complete by: As directed      Allergies as of 03/02/2019   No Known Allergies     Medication List    STOP taking these medications   traMADol 50 MG tablet Commonly known as: ULTRAM     TAKE these medications   amitriptyline 25 MG tablet Commonly known as: ELAVIL Take 25 mg by mouth at bedtime.   amLODipine 10 MG tablet Commonly known as: NORVASC Take 10 mg by mouth daily.   gabapentin 600 MG tablet Commonly known as: NEURONTIN Take 600 mg by mouth 2 (two) times daily as needed (pain.).   oxyCODONE-acetaminophen 7.5-325 MG tablet Commonly known as: Percocet Take 1 tablet by mouth every 4 (four) hours as needed.   Voltaren 1 % Gel Generic drug: diclofenac Sodium Apply 1 application topically 4 (four) times daily as needed (pain).      Follow-up Information    [name], MD. Schedule an appointment as soon as possible for a visit in 2 week(s).   Specialty: Neurosurgery Contact information: [address] [place] [place] [address] [phone]  SignedTraci Sermon 03/02/2019, 9:36 AM

## 2019-03-02 NOTE — Progress Notes (Signed)
Pt was transported downstairs with all belongings to daughter's car. Explained to daughter about discharge instructions and prescription for oxycodone in his patient belonging bag. Hiram Gash RN

## 2019-03-02 NOTE — TOC Transition Note (Signed)
Transition of Care [facility]) - CM/SW Discharge Note   Patient Details  Name: ARTIN BOUCH MRN: [id number] Date of Birth: Jun 08, 1945  Transition of Care [facility]) CM/SW Contact:  Claudie Leach, RN 03/02/2019, 11:50 AM   Clinical Narrative:    Patient to dc home with home health PT/OT.  [facility] able to take referral. D/W daughter.  Patient states PCP is Jonathon Bellows.    Final next level of care: [place] Barriers to Discharge: No Barriers Identified   Patient Goals and CMS Choice Patient states their goals for this hospitalization and ongoing recovery are:: to get home CMS Medicare.gov Compare Post Acute Care list provided to:: Patient Choice offered to / list presented to : Patient  Discharge Plan and Services      HH Arranged: PT, OT [facility] Agency: [place] [facility]) Date [place]: 03/02/19 Time [place]: 1150 Representative spoke with at [place]: Corene Cornea  Readmission Risk Interventions Readmission Risk Prevention Plan 06/29/2018  Post Dischage Appt Complete  Medication Screening Complete  Transportation Screening Complete  Some recent data might be hidden

## 2019-03-02 NOTE — Progress Notes (Signed)
  NEUROSURGERY PROGRESS NOTE   No issues overnight. Pt without significant complaint  EXAM:  BP (!) 137/91   Pulse (!) 109   Temp 100.3 F (37.9 C) (Oral)   Resp 20   Ht 5' 7.5" (1.715 m)   Wt 69.9 kg   SpO2 99%   BMI 23.76 kg/m   Awake, somewhat drowsy after pain medicine Fluent speech Moving RUE/RLE well Baseline left hemiparesis 3-4/5 Wound c/d/i  IMPRESSION:  74 y.o. male POD#2 s/p clipping of recurrent RICA terminus aneurysm, at neurologic baseline.  PLAN: - D/C Home today with Home health PT/OT  While SNF placement was recommended, I think because he is at baseline and with the worsening pandemic, d/c home with HHPT/OT is a better option. Pt agrees.

## 2019-03-02 NOTE — Evaluation (Signed)
Occupational Therapy Evaluation Patient Details Name: Brian [name] MRN: [id number] DOB: 1945/08/28 Today's Date: 03/02/2019    History of Present Illness 74 y.o. 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 PMH: drug abuse, HTN, renal failure, anemia, colitis, chronic pain, DVT, LE weakness, L sided weakness following stroke   Clinical Impression   PTA patient reports needing assist with ADLs from aide, son; using RW/cane for mobility but limited.  Pt with limited history but reports aide assist from 830 am-7 pm, then son assists as well.  He is significantly limited today by lethargy and fatigue, pain, impaired cognition, impaired balance, impaired vision (R eye edema and redness) decreased activity tolerance and generalized weakness. Max cueing to maintain eyes open, difficulty following commands, and poor awareness to deficits, reporting "I could walk to the bathroom".  RN reports medication related lethargy. Patient requires max-total assist for all self care, mod assist +2 for transfers using RW. Based on performance today, recommending SNF level rehab but noted plan to DC home and recommend 24/7 assist with maximal HH services.  Will follow acutely.      Follow Up Recommendations  SNF;Supervision/Assistance - 24 hour(if home needs 24/7 assist, max HH services (PT, OT, aide))    Equipment Recommendations  None recommended by OT(reports has 3:1 commode)    Recommendations for Other Services       Precautions / Restrictions Precautions Precautions: Fall Restrictions Weight Bearing Restrictions: No      Mobility Bed Mobility Overal bed mobility: Needs Assistance Bed Mobility: Supine to Sit     Supine to sit: Mod assist;HOB elevated     General bed mobility comments: mod assist for trunk and LE support, patient with no initation to task but able to engage once therapist initated; limited by  lethargy  Transfers Overall transfer level: Needs assistance Equipment used: Rolling walker (2 wheeled) Transfers: Sit to/from [facility] Sit to Stand: Mod assist;+2 safety/equipment Stand pivot transfers: Mod assist;+2 safety/equipment       General transfer comment: pt requires mod assist to power up and steady from EOB, pivoting to recliner using RW with poor safety awareness, and support to guide hips/descend safely into chair    Balance Overall balance assessment: Needs assistance Sitting-balance support: No upper extremity supported;Feet supported Sitting balance-Leahy Scale: Fair Sitting balance - Comments: min guard to min assist to maintain balance, foward lean and lethragic    Standing balance support: During functional activity;Single extremity supported Standing balance-Leahy Scale: Poor Standing balance comment: pt with R UE on walker and external support required                           ADL either performed or assessed with clinical judgement   ADL Overall ADL's : Needs assistance/impaired     Grooming: Moderate assistance;Sitting   Upper Body Bathing: Maximal assistance;Sitting   Lower Body Bathing: Total assistance;+2 for safety/equipment;Sit to/from stand   Upper Body Dressing : Maximal assistance;Sitting   Lower Body Dressing: Total assistance;+2 for safety/equipment;Sit to/from stand   Toilet Transfer: Moderate assistance;+2 for safety/equipment;Stand-pivot           Functional mobility during ADLs: Moderate assistance;+2 for safety/equipment;Rolling walker;Cueing for safety;Cueing for sequencing General ADL Comments: requires assist for all self care at this time, generally weak, impaired cognition, L sided hemiparesis from prior CVA     Vision Baseline Vision/History: Wears glasses Patient Visual Report: Blurring  of vision Vision Assessment?: Vision impaired- to be further tested in functional context Additional  Comments: R eye swollen and red, pt with limited eye opening during session; continue assessment      Perception     Praxis      Pertinent Vitals/Pain Pain Assessment: Faces Faces Pain Scale: Hurts even more Pain Location: head Pain Descriptors / Indicators: Aching;Constant;Headache;Grimacing;Pressure;Throbbing Pain Intervention(s): Limited activity within patient's tolerance;Monitored during session;Repositioned     Hand Dominance Right   Extremity/Trunk Assessment Upper Extremity Assessment Upper Extremity Assessment: Generalized weakness;LUE deficits/detail LUE Deficits / Details: hx of deficits from CVA, flexed position throughout session; L hand able to grasp minimally LUE Coordination: decreased fine motor;decreased gross motor   Lower Extremity Assessment Lower Extremity Assessment: Defer to PT evaluation       Communication Communication Communication: Other (comment)(lethargic, mumbled speech)   Cognition Arousal/Alertness: Lethargic Behavior During Therapy: Flat affect Overall Cognitive Status: No family/caregiver present to determine baseline cognitive functioning Area of Impairment: Problem solving;Awareness;Safety/judgement;Following commands;Attention                   Current Attention Level: Sustained   Following Commands: Follows one step commands with increased time;Follows one step commands inconsistently;Follows multi-step commands inconsistently Safety/Judgement: Decreased awareness of safety;Decreased awareness of deficits Awareness: Intellectual Problem Solving: Slow processing;Decreased initiation;Difficulty sequencing;Requires verbal cues;Requires tactile cues General Comments: pt followed minimal commands during session, anticipate lethargic due to pain medication; pt reports being able to walk to bathroom today but unaware of deficits and unable to maintain eyes open during session   General Comments  VSS stable, BP soft initally but  improved with prolonged sitting     Exercises     Shoulder Instructions      Home Living Family/patient expects to be discharged to:: Private residence Living Arrangements: Children Available Help at Discharge: Family;Personal care attendant;Available 24 hours/day Type of Home: Mobile home Home Access: Stairs to enter Entrance Stairs-Number of Steps: 2 Entrance Stairs-Rails: Left Home Layout: One level     Bathroom Shower/Tub: Teacher, early years/pre: Standard Bathroom Accessibility: Yes   Home Equipment: Latina Craver - 2 wheels;Wheelchair - manual   Additional Comments: M-Sat 1-7pm aide; son works but pt states he does not help with mobility; can get up by himself at home but unsteady; states he will stay in bed until aide gets there to help      Prior Functioning/Environment Level of Independence: Needs assistance  Gait / Transfers Assistance Needed: occasional household ambulator with quad cane or furniture surfing; aide helps him with mobility from 1-7pm ADL's / Homemaking Assistance Needed: aide assists with ADLs, son assists at night     Comments: today patient reports aide available from 830 am to 7pm; unsure of accuracy, pt lethargic with limited verbalizations         OT Problem List: Decreased strength;Decreased range of motion;Decreased activity tolerance;Impaired balance (sitting and/or standing);Impaired vision/perception;Decreased cognition;Decreased coordination;Decreased safety awareness;Decreased knowledge of use of DME or AE;Decreased knowledge of precautions;Pain;Impaired UE functional use      OT Treatment/Interventions: Self-care/ADL training;Neuromuscular education;DME and/or AE instruction;Therapeutic activities;Cognitive remediation/compensation;Visual/perceptual remediation/compensation;Balance training;Patient/family education    OT Goals(Current goals can be found in the care plan section) Acute Rehab OT Goals Patient Stated Goal:  go home OT Goal Formulation: With patient Time For Goal Achievement: 03/16/19 Potential to Achieve Goals: Good  OT Frequency: Min 2X/week   Barriers to D/C:            Co-evaluation  AM-PAC OT "6 Clicks" Daily Activity     Outcome Measure Help from another person eating meals?: A Lot Help from another person taking care of personal grooming?: A Lot Help from another person toileting, which includes using toliet, bedpan, or urinal?: Total Help from another person bathing (including washing, rinsing, drying)?: A Lot Help from another person to put on and taking off regular upper body clothing?: A Lot Help from another person to put on and taking off regular lower body clothing?: Total 6 Click Score: 10   End of Session Equipment Utilized During Treatment: Rolling walker Nurse Communication: Mobility status  Activity Tolerance: Patient limited by lethargy Patient left: in chair;with call bell/phone within reach;with nursing/sitter in room  OT Visit Diagnosis: Other abnormalities of gait and mobility (R26.89);Muscle weakness (generalized) (M62.81);Pain;Other symptoms and signs involving cognitive function Pain - part of body: (head)                Time: 1020-1041 OT Time Calculation (min): 21 min Charges:  OT General Charges $OT Visit: 1 Visit OT Evaluation $OT Eval Moderate Complexity: 1 Mod  Jolaine Artist, OT Acute Rehabilitation Services Pager [phone] Office [phone]   Delight Stare 03/02/2019, 10:53 AM

## 2019-03-27 ENCOUNTER — Other Ambulatory Visit: Payer: Self-pay

## 2019-03-27 ENCOUNTER — Inpatient Hospital Stay (HOSPITAL_COMMUNITY)
Admission: EM | Admit: 2019-03-27 | Discharge: 2019-04-03 | DRG: 026 | Disposition: A | Discharge status: Home Health | Payer: Medicare Other | Attending: Neurosurgery | Admitting: Neurosurgery

## 2019-03-27 ENCOUNTER — Emergency Department (HOSPITAL_COMMUNITY): Payer: Medicare Other

## 2019-03-27 ENCOUNTER — Encounter (HOSPITAL_COMMUNITY): Payer: Self-pay

## 2019-03-27 DIAGNOSIS — Z8673 Personal history of transient ischemic attack (TIA), and cerebral infarction without residual deficits: Secondary | ICD-10-CM

## 2019-03-27 DIAGNOSIS — G8194 Hemiplegia, unspecified affecting left nondominant side: Secondary | ICD-10-CM | POA: Diagnosis present

## 2019-03-27 DIAGNOSIS — X58XXXA Exposure to other specified factors, initial encounter: Secondary | ICD-10-CM | POA: Diagnosis present

## 2019-03-27 DIAGNOSIS — Z6824 Body mass index (BMI) 24.0-24.9, adult: Secondary | ICD-10-CM | POA: Diagnosis not present

## 2019-03-27 DIAGNOSIS — Z20822 Contact with and (suspected) exposure to covid-19: Secondary | ICD-10-CM | POA: Diagnosis present

## 2019-03-27 DIAGNOSIS — R627 Adult failure to thrive: Secondary | ICD-10-CM | POA: Diagnosis present

## 2019-03-27 DIAGNOSIS — R410 Disorientation, unspecified: Secondary | ICD-10-CM | POA: Diagnosis present

## 2019-03-27 DIAGNOSIS — R262 Difficulty in walking, not elsewhere classified: Secondary | ICD-10-CM | POA: Diagnosis present

## 2019-03-27 DIAGNOSIS — F17210 Nicotine dependence, cigarettes, uncomplicated: Secondary | ICD-10-CM | POA: Diagnosis present

## 2019-03-27 DIAGNOSIS — R531 Weakness: Secondary | ICD-10-CM | POA: Diagnosis present

## 2019-03-27 DIAGNOSIS — Z79899 Other long term (current) drug therapy: Secondary | ICD-10-CM

## 2019-03-27 DIAGNOSIS — Z86718 Personal history of other venous thrombosis and embolism: Secondary | ICD-10-CM | POA: Diagnosis not present

## 2019-03-27 DIAGNOSIS — R4182 Altered mental status, unspecified: Secondary | ICD-10-CM

## 2019-03-27 DIAGNOSIS — M961 Postlaminectomy syndrome, not elsewhere classified: Secondary | ICD-10-CM | POA: Diagnosis present

## 2019-03-27 DIAGNOSIS — M21372 Foot drop, left foot: Secondary | ICD-10-CM | POA: Diagnosis present

## 2019-03-27 DIAGNOSIS — I10 Essential (primary) hypertension: Secondary | ICD-10-CM | POA: Diagnosis present

## 2019-03-27 DIAGNOSIS — N289 Disorder of kidney and ureter, unspecified: Secondary | ICD-10-CM | POA: Diagnosis present

## 2019-03-27 DIAGNOSIS — S065X9A Traumatic subdural hemorrhage with loss of consciousness of unspecified duration, initial encounter: Secondary | ICD-10-CM | POA: Diagnosis present

## 2019-03-27 DIAGNOSIS — R630 Anorexia: Secondary | ICD-10-CM | POA: Diagnosis present

## 2019-03-27 DIAGNOSIS — I6203 Nontraumatic chronic subdural hemorrhage: Secondary | ICD-10-CM | POA: Diagnosis present

## 2019-03-27 DIAGNOSIS — N400 Enlarged prostate without lower urinary tract symptoms: Secondary | ICD-10-CM | POA: Diagnosis present

## 2019-03-27 DIAGNOSIS — S065XAA Traumatic subdural hemorrhage with loss of consciousness status unknown, initial encounter: Secondary | ICD-10-CM | POA: Diagnosis present

## 2019-03-27 DIAGNOSIS — E860 Dehydration: Secondary | ICD-10-CM | POA: Diagnosis present

## 2019-03-27 LAB — COMPREHENSIVE METABOLIC PANEL
ALT: 18 U/L (ref 0–44)
AST: 25 U/L (ref 15–41)
Albumin: 3.4 g/dL — ABNORMAL LOW (ref 3.5–5.0)
Alkaline Phosphatase: 59 U/L (ref 38–126)
Anion gap: 12 (ref 5–15)
BUN: 26 mg/dL — ABNORMAL HIGH (ref 8–23)
CO2: 22 mmol/L (ref 22–32)
Calcium: 9 mg/dL (ref 8.9–10.3)
Chloride: 105 mmol/L (ref 98–111)
Creatinine, Ser: 1.16 mg/dL (ref 0.61–1.24)
GFR calc Af Amer: >60 mL/min (ref >60)
GFR calc non Af Amer: >60 mL/min (ref >60)
Glucose, Bld: 91 mg/dL (ref 70–99)
Potassium: 4.6 mmol/L (ref 3.5–5.1)
Sodium: 139 mmol/L (ref 135–145)
Total Bilirubin: 1.1 mg/dL (ref 0.3–1.2)
Total Protein: 9.3 g/dL — ABNORMAL HIGH (ref 6.5–8.1)

## 2019-03-27 LAB — URINALYSIS, ROUTINE W REFLEX MICROSCOPIC
Bilirubin Urine: NEGATIVE
Glucose, UA: NEGATIVE mg/dL
Hgb urine dipstick: NEGATIVE
Ketones, ur: 20 mg/dL — AB
Leukocytes,Ua: NEGATIVE
Nitrite: NEGATIVE
Protein, ur: NEGATIVE mg/dL
Specific Gravity, Urine: 1.024 (ref 1.005–1.030)
pH: 5 (ref 5.0–8.0)

## 2019-03-27 LAB — CBC WITH DIFFERENTIAL/PLATELET
Abs Immature Granulocytes: 0.01 10*3/uL (ref 0.00–0.07)
Basophils Absolute: 0 10*3/uL (ref 0.0–0.1)
Basophils Relative: 1 %
Eosinophils Absolute: 0.1 10*3/uL (ref 0.0–0.5)
Eosinophils Relative: 2 %
HCT: 40.5 % (ref 39.0–52.0)
Hemoglobin: 12.3 g/dL — ABNORMAL LOW (ref 13.0–17.0)
Immature Granulocytes: 0 %
Lymphocytes Relative: 31 %
Lymphs Abs: 2.1 10*3/uL (ref 0.7–4.0)
MCH: 28 pg (ref 26.0–34.0)
MCHC: 30.4 g/dL (ref 30.0–36.0)
MCV: 92 fL (ref 80.0–100.0)
Monocytes Absolute: 0.7 10*3/uL (ref 0.1–1.0)
Monocytes Relative: 10 %
Neutro Abs: 3.9 10*3/uL (ref 1.7–7.7)
Neutrophils Relative %: 56 %
Platelets: 226 10*3/uL (ref 150–400)
RBC: 4.4 MIL/uL (ref 4.22–5.81)
RDW: 14 % (ref 11.5–15.5)
WBC: 6.8 10*3/uL (ref 4.0–10.5)
nRBC: 0 % (ref 0.0–0.2)

## 2019-03-27 LAB — RESPIRATORY PANEL BY RT PCR (FLU A&B, COVID)
Influenza A by PCR: NEGATIVE
Influenza B by PCR: NEGATIVE
SARS Coronavirus 2 by RT PCR: NEGATIVE

## 2019-03-27 LAB — PROTIME-INR
INR: 1.1 (ref 0.8–1.2)
Prothrombin Time: 14 seconds (ref 11.4–15.2)

## 2019-03-27 LAB — TROPONIN I (HIGH SENSITIVITY)
Troponin I (High Sensitivity): 6 ng/L (ref <18)
Troponin I (High Sensitivity): 6 ng/L (ref <18)

## 2019-03-27 LAB — APTT: aPTT: 33 seconds (ref 24–36)

## 2019-03-27 MED ORDER — LABETALOL HCL 5 MG/ML IV SOLN: 20.0000 mg | Freq: Once | INTRAVENOUS | Status: DC

## 2019-03-27 MED ORDER — SENNOSIDES-DOCUSATE SODIUM 8.6-50 MG PO TABS
1.0000 | ORAL_TABLET | Freq: Two times a day (BID) | ORAL | Status: DC
  Administered 2019-03-28 – 2019-04-02 (×9): 1 via ORAL
  Filled 2019-03-27 (×8): qty 1

## 2019-03-27 MED ORDER — GABAPENTIN 600 MG PO TABS
600.0000 mg | ORAL_TABLET | Freq: Two times a day (BID) | ORAL | Status: DC | PRN
  Administered 2019-03-31: 600 mg via ORAL
  Filled 2019-03-27 (×2): qty 1

## 2019-03-27 MED ORDER — NICARDIPINE HCL IN NACL 20-0.86 MG/200ML-% IV SOLN
0.0000 mg/h | INTRAVENOUS | Status: DC
  Administered 2019-03-28: 2 mg/h via INTRAVENOUS
  Administered 2019-03-28: 5 mg/h via INTRAVENOUS
  Filled 2019-03-27: qty 200

## 2019-03-27 MED ORDER — PANTOPRAZOLE SODIUM 40 MG IV SOLR
40.0000 mg | Freq: Every day | INTRAVENOUS | Status: DC
  Administered 2019-03-27 – 2019-03-28 (×2): 40 mg via INTRAVENOUS
  Filled 2019-03-27 (×2): qty 40

## 2019-03-27 MED ORDER — NIMODIPINE 6 MG/ML PO SOLN
60.0000 mg | ORAL | Status: DC
  Filled 2019-03-27 (×12): qty 10

## 2019-03-27 MED ORDER — LEVETIRACETAM IN NACL 500 MG/100ML IV SOLN
500.0000 mg | Freq: Once | INTRAVENOUS | Status: AC
  Administered 2019-03-27: 500 mg via INTRAVENOUS
  Filled 2019-03-27 (×2): qty 100

## 2019-03-27 MED ORDER — AMITRIPTYLINE HCL 25 MG PO TABS
25.0000 mg | ORAL_TABLET | Freq: Every day | ORAL | Status: DC
  Administered 2019-03-28 – 2019-04-02 (×6): 25 mg via ORAL
  Filled 2019-03-27 (×6): qty 1

## 2019-03-27 MED ORDER — ACETAMINOPHEN 650 MG RE SUPP: 650.0000 mg | RECTAL | Status: DC | PRN

## 2019-03-27 MED ORDER — ACETAMINOPHEN 160 MG/5ML PO SOLN: 650.0000 mg | ORAL | Status: DC | PRN

## 2019-03-27 MED ORDER — NICARDIPINE HCL IN NACL 20-0.86 MG/200ML-% IV SOLN
0.0000 mg/h | INTRAVENOUS | Status: DC
  Administered 2019-03-27: 5 mg/h via INTRAVENOUS
  Filled 2019-03-27 (×2): qty 200

## 2019-03-27 MED ORDER — LEVETIRACETAM IN NACL 500 MG/100ML IV SOLN
500.0000 mg | Freq: Two times a day (BID) | INTRAVENOUS | Status: DC
  Administered 2019-03-27 – 2019-03-28 (×3): 500 mg via INTRAVENOUS
  Filled 2019-03-27 (×3): qty 100

## 2019-03-27 MED ORDER — NIMODIPINE 30 MG PO CAPS
60.0000 mg | ORAL_CAPSULE | ORAL | Status: DC
  Filled 2019-03-27 (×13): qty 2

## 2019-03-27 MED ORDER — AMLODIPINE BESYLATE 10 MG PO TABS
10.0000 mg | ORAL_TABLET | Freq: Every day | ORAL | Status: DC
  Administered 2019-03-29 – 2019-04-03 (×6): 10 mg via ORAL
  Filled 2019-03-27 (×6): qty 1

## 2019-03-27 MED ORDER — SODIUM CHLORIDE 0.9 % IV SOLN
INTRAVENOUS | Status: DC | PRN
  Administered 2019-03-27 – 2019-03-28 (×2): 250 mL via INTRAVENOUS

## 2019-03-27 MED ORDER — SODIUM CHLORIDE 0.9 % IV SOLN: INTRAVENOUS | Status: AC

## 2019-03-27 MED ORDER — POTASSIUM CHLORIDE IN NACL 20-0.9 MEQ/L-% IV SOLN
INTRAVENOUS | Status: DC
  Filled 2019-03-27 (×2): qty 1000

## 2019-03-27 MED ORDER — ORAL CARE MOUTH RINSE
15.0000 mL | Freq: Two times a day (BID) | OROMUCOSAL | Status: DC
  Administered 2019-03-27 – 2019-04-02 (×13): 15 mL via OROMUCOSAL

## 2019-03-27 MED ORDER — ACETAMINOPHEN 325 MG PO TABS
650.0000 mg | ORAL_TABLET | ORAL | Status: DC | PRN
  Administered 2019-03-28: 650 mg via ORAL
  Filled 2019-03-27: qty 2

## 2019-03-27 MED ORDER — SODIUM CHLORIDE 0.9 % IV BOLUS
1000.0000 mL | Freq: Once | INTRAVENOUS | Status: AC
  Administered 2019-03-27: 1000 mL via INTRAVENOUS

## 2019-03-27 MED ORDER — LABETALOL HCL 5 MG/ML IV SOLN
20.0000 mg | Freq: Once | INTRAVENOUS | Status: AC
  Administered 2019-03-27: 20 mg via INTRAVENOUS
  Filled 2019-03-27: qty 4

## 2019-03-27 MED ORDER — MUPIROCIN 2 % EX OINT
1.0000 "application " | TOPICAL_OINTMENT | Freq: Two times a day (BID) | CUTANEOUS | Status: AC
  Administered 2019-03-28 – 2019-04-01 (×4): 1 via NASAL
  Filled 2019-03-27 (×2): qty 22

## 2019-03-27 NOTE — ED Notes (Addendum)
Pt's daughter updated per Dr Rogene Houston

## 2019-03-27 NOTE — ED Notes (Signed)
Pt unable to sign;/ transfer discussed with pt's daughter per MD.

## 2019-03-27 NOTE — ED Provider Notes (Signed)
CRITICAL CARE Performed by: Fredia Sorrow Total critical care time: 45 minutes Critical care time was exclusive of separately billable procedures and treating other patients. Critical care was necessary to treat or prevent imminent or life-threatening deterioration. Critical care was time spent personally by me on the following activities: development of treatment plan with patient and/or surrogate as well as nursing, discussions with consultants, evaluation of patient's response to treatment, examination of patient, obtaining history from patient or surrogate, ordering and performing treatments and interventions, ordering and review of laboratory studies, ordering and review of radiographic studies, pulse oximetry and re-evaluation of patient's condition.   Patient CT showed evidence of a fairly significant subdural hematoma.  Patient with previous subarachnoid.  Patient had admission December 15 as well as January 7 for the subarachnoid hemorrhage as well as a clipping.  Discussed today with Dr. Deatra Ina neurosurgery at [facility].  Once patient admitted to the neurosurgery ICU there.  N.p.o. blood pressure control Covid testing has been negative.  Patient started on Lopressor and Cardene IV.  Temporary admit orders put in for ICU.  He also wanted head elevated 30 degrees.  And patient did receive some IV fluids.  Patient is is awake here will follow commands.  There is some degree of confusion.  There seems to be previous weakness to the left upper extremity greater than right lower extremity.  I think this was probably related to the subarachnoid hemorrhage not related to today subdural hematoma.  The subdural hematoma is not acute could be 23 to 70 weeks old.  Neurosurgery estimated at being about 39 weeks old.  They are planning to take him to the OR.  Patient's daughter's information was provided.  Night spoken with the daughter.  Patient's daughter is Juel Burrow her cell phone number is  [phone].       Fredia Sorrow, MD 03/27/19 [phone]
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
## 2019-03-27 NOTE — ED Triage Notes (Signed)
Pt arrives via EMS from home. Pt s/p neuro surgery on 01/07 for aneurysm. Per family pt has not been eating or drinking well for the last few days, generalized weakness. Per family report when they were assisting him out of bed today he was very weak so they laid him in the floor and called 911.

## 2019-03-28 ENCOUNTER — Encounter (HOSPITAL_COMMUNITY): Payer: Self-pay | Admitting: Neurosurgery

## 2019-03-28 ENCOUNTER — Encounter (HOSPITAL_COMMUNITY)
Admission: EM | Disposition: A | Discharge status: Home Health | Payer: Self-pay | Source: Home / Self Care | Attending: Neurosurgery

## 2019-03-28 ENCOUNTER — Inpatient Hospital Stay (HOSPITAL_COMMUNITY): Payer: Medicare Other | Admitting: Certified Registered"

## 2019-03-28 HISTORY — PX: BURR HOLE: SHX908

## 2019-03-28 LAB — BASIC METABOLIC PANEL
Anion gap: 13 (ref 5–15)
BUN: 16 mg/dL (ref 8–23)
CO2: 22 mmol/L (ref 22–32)
Calcium: 9.1 mg/dL (ref 8.9–10.3)
Chloride: 114 mmol/L — ABNORMAL HIGH (ref 98–111)
Creatinine, Ser: 1.01 mg/dL (ref 0.61–1.24)
GFR calc Af Amer: >60 mL/min (ref >60)
GFR calc non Af Amer: >60 mL/min (ref >60)
Glucose, Bld: 91 mg/dL (ref 70–99)
Potassium: 4.7 mmol/L (ref 3.5–5.1)
Sodium: 149 mmol/L — ABNORMAL HIGH (ref 135–145)

## 2019-03-28 LAB — CBC
HCT: 35.4 % — ABNORMAL LOW (ref 39.0–52.0)
Hemoglobin: 10.7 g/dL — ABNORMAL LOW (ref 13.0–17.0)
MCH: 27.8 pg (ref 26.0–34.0)
MCHC: 30.2 g/dL (ref 30.0–36.0)
MCV: 91.9 fL (ref 80.0–100.0)
Platelets: 205 10*3/uL (ref 150–400)
RBC: 3.85 MIL/uL — ABNORMAL LOW (ref 4.22–5.81)
RDW: 14 % (ref 11.5–15.5)
WBC: 8 10*3/uL (ref 4.0–10.5)
nRBC: 0 % (ref 0.0–0.2)

## 2019-03-28 LAB — TYPE AND SCREEN
ABO/RH(D): O POS
Antibody Screen: NEGATIVE

## 2019-03-28 LAB — SURGICAL PCR SCREEN
MRSA, PCR: NEGATIVE
Staphylococcus aureus: NEGATIVE

## 2019-03-28 SURGERY — CREATION, CRANIAL BURR HOLE
Anesthesia: General | Laterality: Left

## 2019-03-28 SURGERY — CREATION, CRANIAL BURR HOLE
Anesthesia: General | Site: Head | Laterality: Left

## 2019-03-28 MED ORDER — FENTANYL CITRATE (PF) 250 MCG/5ML IJ SOLN
INTRAMUSCULAR | Status: AC
  Filled 2019-03-28: qty 5

## 2019-03-28 MED ORDER — LIDOCAINE 2% (20 MG/ML) 5 ML SYRINGE
INTRAMUSCULAR | Status: DC | PRN
  Administered 2019-03-28: 100 mg via INTRAVENOUS

## 2019-03-28 MED ORDER — ONDANSETRON HCL 4 MG PO TABS: 4.0000 mg | ORAL_TABLET | ORAL | Status: DC | PRN

## 2019-03-28 MED ORDER — ONDANSETRON HCL 4 MG/2ML IJ SOLN: 4.0000 mg | Freq: Once | INTRAMUSCULAR | Status: DC | PRN

## 2019-03-28 MED ORDER — SODIUM CHLORIDE 0.9 % IV SOLN
INTRAVENOUS | Status: DC | PRN
  Administered 2019-03-28: 500 mL

## 2019-03-28 MED ORDER — THROMBIN 5000 UNITS EX SOLR
CUTANEOUS | Status: AC
  Filled 2019-03-28: qty 5000

## 2019-03-28 MED ORDER — DEXAMETHASONE SODIUM PHOSPHATE 10 MG/ML IJ SOLN
INTRAMUSCULAR | Status: DC | PRN
  Administered 2019-03-28: 5 mg via INTRAVENOUS

## 2019-03-28 MED ORDER — ENOXAPARIN SODIUM 40 MG/0.4ML ~~LOC~~ SOLN: 40.0000 mg | SUBCUTANEOUS | Status: DC

## 2019-03-28 MED ORDER — SUGAMMADEX SODIUM 200 MG/2ML IV SOLN
INTRAVENOUS | Status: DC | PRN
  Administered 2019-03-28: 300 mg via INTRAVENOUS

## 2019-03-28 MED ORDER — CEFAZOLIN SODIUM 1 G IJ SOLR
INTRAMUSCULAR | Status: AC
  Filled 2019-03-28: qty 20

## 2019-03-28 MED ORDER — FENTANYL CITRATE (PF) 100 MCG/2ML IJ SOLN: 25.0000 ug | INTRAMUSCULAR | Status: DC | PRN

## 2019-03-28 MED ORDER — ONDANSETRON HCL 4 MG/2ML IJ SOLN
INTRAMUSCULAR | Status: AC
  Filled 2019-03-28: qty 4

## 2019-03-28 MED ORDER — SODIUM CHLORIDE 0.9 % IV SOLN: INTRAVENOUS | Status: DC

## 2019-03-28 MED ORDER — THROMBIN 20000 UNITS EX SOLR
CUTANEOUS | Status: DC | PRN
  Administered 2019-03-28: 20 mL via TOPICAL

## 2019-03-28 MED ORDER — 0.9 % SODIUM CHLORIDE (POUR BTL) OPTIME
TOPICAL | Status: DC | PRN
  Administered 2019-03-28 (×3): 1000 mL

## 2019-03-28 MED ORDER — THROMBIN 5000 UNITS EX SOLR
OROMUCOSAL | Status: DC | PRN
  Administered 2019-03-28: 5 mL via TOPICAL

## 2019-03-28 MED ORDER — PROPOFOL 10 MG/ML IV BOLUS
INTRAVENOUS | Status: DC | PRN
  Administered 2019-03-28: 100 mg via INTRAVENOUS

## 2019-03-28 MED ORDER — BACITRACIN ZINC 500 UNIT/GM EX OINT
TOPICAL_OINTMENT | CUTANEOUS | Status: AC
  Filled 2019-03-28: qty 28.35

## 2019-03-28 MED ORDER — DEXAMETHASONE SODIUM PHOSPHATE 10 MG/ML IJ SOLN
INTRAMUSCULAR | Status: AC
  Filled 2019-03-28: qty 2

## 2019-03-28 MED ORDER — ONDANSETRON HCL 4 MG/2ML IJ SOLN: 4.0000 mg | INTRAMUSCULAR | Status: DC | PRN

## 2019-03-28 MED ORDER — PHENYLEPHRINE HCL-NACL 10-0.9 MG/250ML-% IV SOLN
INTRAVENOUS | Status: DC | PRN
  Administered 2019-03-28: 25 ug/min via INTRAVENOUS

## 2019-03-28 MED ORDER — THROMBIN 20000 UNITS EX SOLR
CUTANEOUS | Status: AC
  Filled 2019-03-28: qty 20000

## 2019-03-28 MED ORDER — CEFAZOLIN SODIUM-DEXTROSE 1-4 GM/50ML-% IV SOLN
1.0000 g | Freq: Three times a day (TID) | INTRAVENOUS | Status: AC
  Administered 2019-03-28 – 2019-03-29 (×3): 1 g via INTRAVENOUS
  Filled 2019-03-28 (×4): qty 50

## 2019-03-28 MED ORDER — PROMETHAZINE HCL 25 MG PO TABS: 12.5000 mg | ORAL_TABLET | ORAL | Status: DC | PRN

## 2019-03-28 MED ORDER — BUPIVACAINE HCL (PF) 0.5 % IJ SOLN
INTRAMUSCULAR | Status: AC
  Filled 2019-03-28: qty 30

## 2019-03-28 MED ORDER — HYDROMORPHONE HCL 1 MG/ML IJ SOLN
0.5000 mg | INTRAMUSCULAR | Status: DC | PRN
  Administered 2019-03-28 – 2019-03-31 (×2): 1 mg via INTRAVENOUS
  Filled 2019-03-28 (×2): qty 1

## 2019-03-28 MED ORDER — CEFAZOLIN SODIUM-DEXTROSE 2-3 GM-%(50ML) IV SOLR
INTRAVENOUS | Status: DC | PRN
  Administered 2019-03-28: 2 g via INTRAVENOUS

## 2019-03-28 MED ORDER — LIDOCAINE-EPINEPHRINE 1 %-1:100000 IJ SOLN
INTRAMUSCULAR | Status: DC | PRN
  Administered 2019-03-28: 6 mL

## 2019-03-28 MED ORDER — LIDOCAINE-EPINEPHRINE 1 %-1:100000 IJ SOLN
INTRAMUSCULAR | Status: AC
  Filled 2019-03-28: qty 1

## 2019-03-28 MED ORDER — HEMOSTATIC AGENTS (NO CHARGE) OPTIME
TOPICAL | Status: DC | PRN
  Administered 2019-03-28: 1 via TOPICAL

## 2019-03-28 MED ORDER — CHLORHEXIDINE GLUCONATE CLOTH 2 % EX PADS
6.0000 | MEDICATED_PAD | Freq: Every day | CUTANEOUS | Status: DC
  Administered 2019-03-28 – 2019-04-02 (×4): 6 via TOPICAL

## 2019-03-28 MED ORDER — POLYETHYLENE GLYCOL 3350 17 G PO PACK
17.0000 g | PACK | Freq: Every day | ORAL | Status: DC
  Administered 2019-03-28 – 2019-04-02 (×4): 17 g via ORAL
  Filled 2019-03-28 (×4): qty 1

## 2019-03-28 MED ORDER — ONDANSETRON HCL 4 MG/2ML IJ SOLN
INTRAMUSCULAR | Status: DC | PRN
  Administered 2019-03-28: 4 mg via INTRAVENOUS

## 2019-03-28 MED ORDER — POTASSIUM CHLORIDE IN NACL 20-0.9 MEQ/L-% IV SOLN
INTRAVENOUS | Status: DC
  Filled 2019-03-28 (×5): qty 1000
  Filled 2019-03-28: qty 2000
  Filled 2019-03-28 (×2): qty 1000

## 2019-03-28 MED ORDER — FENTANYL CITRATE (PF) 250 MCG/5ML IJ SOLN
INTRAMUSCULAR | Status: DC | PRN
  Administered 2019-03-28: 150 ug via INTRAVENOUS

## 2019-03-28 MED ORDER — OXYCODONE-ACETAMINOPHEN 7.5-325 MG PO TABS
1.0000 | ORAL_TABLET | ORAL | Status: DC | PRN
  Administered 2019-03-31: 1 via ORAL
  Filled 2019-03-28: qty 1

## 2019-03-28 MED ORDER — LABETALOL HCL 5 MG/ML IV SOLN
10.0000 mg | INTRAVENOUS | Status: DC | PRN
  Administered 2019-03-29 – 2019-03-30 (×2): 10 mg via INTRAVENOUS
  Administered 2019-03-30: 20 mg via INTRAVENOUS
  Filled 2019-03-28 (×3): qty 4

## 2019-03-28 MED ORDER — ROCURONIUM BROMIDE 10 MG/ML (PF) SYRINGE
PREFILLED_SYRINGE | INTRAVENOUS | Status: AC
  Filled 2019-03-28: qty 20

## 2019-03-28 MED ORDER — DOCUSATE SODIUM 100 MG PO CAPS
100.0000 mg | ORAL_CAPSULE | Freq: Two times a day (BID) | ORAL | Status: DC
  Administered 2019-03-28 – 2019-04-03 (×12): 100 mg via ORAL
  Filled 2019-03-28 (×11): qty 1

## 2019-03-28 MED ORDER — LIDOCAINE 2% (20 MG/ML) 5 ML SYRINGE
INTRAMUSCULAR | Status: AC
  Filled 2019-03-28: qty 10

## 2019-03-28 MED ORDER — FLEET ENEMA 7-19 GM/118ML RE ENEM: 1.0000 | ENEMA | Freq: Once | RECTAL | Status: DC | PRN

## 2019-03-28 MED ORDER — ROCURONIUM BROMIDE 10 MG/ML (PF) SYRINGE
PREFILLED_SYRINGE | INTRAVENOUS | Status: DC | PRN
  Administered 2019-03-28: 80 mg via INTRAVENOUS

## 2019-03-28 MED ORDER — PROPOFOL 10 MG/ML IV BOLUS
INTRAVENOUS | Status: AC
  Filled 2019-03-28: qty 20

## 2019-03-28 SURGICAL SUPPLY — 81 items
APL SKNCLS STERI-STRIP NONHPOA (GAUZE/BANDAGES/DRESSINGS)
BAG DRN CSF CATH SYS STRL MNTR (MISCELLANEOUS) ×1
BENZOIN TINCTURE PRP APPL 2/3 (GAUZE/BANDAGES/DRESSINGS) IMPLANT
BLADE CLIPPER SURG (BLADE) ×3 IMPLANT
BLADE ULTRA TIP 2M (BLADE) ×3 IMPLANT
BNDG GAUZE ELAST 4 BULKY (GAUZE/BANDAGES/DRESSINGS) IMPLANT
BUR ACORN 6.0 PRECISION (BURR) ×2 IMPLANT
BUR ACORN 6.0MM PRECISION (BURR) ×1
BUR MATCHSTICK NEURO 3.0 LAGG (BURR) ×2 IMPLANT
CANISTER SUCT 3000ML PPV (MISCELLANEOUS) ×3 IMPLANT
CARTRIDGE OIL MAESTRO DRILL (MISCELLANEOUS) ×1 IMPLANT
CATH VENTRIC 35X38 W/TROCAR LG (CATHETERS) ×2 IMPLANT
CLIP VESOCCLUDE MED 6/CT (CLIP) ×3 IMPLANT
COVER WAND RF STERILE (DRAPES) ×3 IMPLANT
DIFFUSER DRILL AIR PNEUMATIC (MISCELLANEOUS) ×3 IMPLANT
DRAPE NEUROLOGICAL W/INCISE (DRAPES) ×3 IMPLANT
DRAPE SURG 17X23 STRL (DRAPES) IMPLANT
DRAPE WARM FLUID 44X44 (DRAPES) ×3 IMPLANT
DRSG OPSITE POSTOP 3X4 (GAUZE/BANDAGES/DRESSINGS) ×4 IMPLANT
DURAPREP 6ML APPLICATOR 50/CS (WOUND CARE) ×3 IMPLANT
ELECT COATED BLADE 2.86 ST (ELECTRODE) ×2 IMPLANT
ELECT REM PT RETURN 9FT ADLT (ELECTROSURGICAL) ×3
ELECTRODE REM PT RTRN 9FT ADLT (ELECTROSURGICAL) ×1 IMPLANT
EVACUATOR 1/8 PVC DRAIN (DRAIN) IMPLANT
EVACUATOR SILICONE 100CC (DRAIN) IMPLANT
GAUZE 4X4 16PLY RFD (DISPOSABLE) IMPLANT
GAUZE SPONGE 4X4 12PLY STRL (GAUZE/BANDAGES/DRESSINGS) ×1 IMPLANT
GLOVE BIO SURGEON STRL SZ7.5 (GLOVE) ×2 IMPLANT
GLOVE BIOGEL PI IND STRL 7.5 (GLOVE) ×1 IMPLANT
GLOVE BIOGEL PI IND STRL 8 (GLOVE) IMPLANT
GLOVE BIOGEL PI INDICATOR 7.5 (GLOVE) ×4
GLOVE BIOGEL PI INDICATOR 8 (GLOVE) ×4
GLOVE ECLIPSE 7.0 STRL STRAW (GLOVE) ×6 IMPLANT
GLOVE ECLIPSE 7.5 STRL STRAW (GLOVE) ×6 IMPLANT
GLOVE EXAM NITRILE XL STR (GLOVE) IMPLANT
GLOVE SURG SS PI 7.5 STRL IVOR (GLOVE) ×8 IMPLANT
GOWN STRL REUS W/ TWL LRG LVL3 (GOWN DISPOSABLE) ×2 IMPLANT
GOWN STRL REUS W/ TWL XL LVL3 (GOWN DISPOSABLE) IMPLANT
GOWN STRL REUS W/TWL 2XL LVL3 (GOWN DISPOSABLE) ×2 IMPLANT
GOWN STRL REUS W/TWL LRG LVL3 (GOWN DISPOSABLE) ×3
GOWN STRL REUS W/TWL XL LVL3 (GOWN DISPOSABLE) ×3
HEMOSTAT POWDER KIT SURGIFOAM (HEMOSTASIS) ×3 IMPLANT
HEMOSTAT SURGICEL 2X14 (HEMOSTASIS) IMPLANT
KIT BASIN OR (CUSTOM PROCEDURE TRAY) ×3 IMPLANT
KIT TURNOVER KIT B (KITS) ×3 IMPLANT
NDL HYPO 25X1 1.5 SAFETY (NEEDLE) ×1 IMPLANT
NEEDLE HYPO 25X1 1.5 SAFETY (NEEDLE) ×3 IMPLANT
NS IRRIG 1000ML POUR BTL (IV SOLUTION) ×3 IMPLANT
OIL CARTRIDGE MAESTRO DRILL (MISCELLANEOUS) ×3
PACK CRANIOTOMY CUSTOM (CUSTOM PROCEDURE TRAY) ×3 IMPLANT
PATTIES SURGICAL .5 X.5 (GAUZE/BANDAGES/DRESSINGS) IMPLANT
PATTIES SURGICAL .5 X3 (DISPOSABLE) IMPLANT
PATTIES SURGICAL 1X1 (DISPOSABLE) IMPLANT
PERFORATOR LRG  14-11MM (BIT) ×2
PERFORATOR LRG 14-11MM (BIT) IMPLANT
SPONGE NEURO XRAY DETECT 1X3 (DISPOSABLE) IMPLANT
SPONGE SURGIFOAM ABS GEL 100 (HEMOSTASIS) ×3 IMPLANT
SPONGE SURGIFOAM ABS GEL SZ50 (HEMOSTASIS) ×2 IMPLANT
STAPLER VISISTAT 35W (STAPLE) ×3 IMPLANT
STOCKINETTE 6  STRL (DRAPES) ×2
STOCKINETTE 6 STRL (DRAPES) ×1 IMPLANT
SUT ETHILON 3 0 FSL (SUTURE) IMPLANT
SUT ETHILON 3 0 PS 1 (SUTURE) IMPLANT
SUT MNCRL AB 3-0 PS2 18 (SUTURE) ×2 IMPLANT
SUT NURALON 4 0 TR CR/8 (SUTURE) ×3 IMPLANT
SUT STEEL 0 (SUTURE)
SUT STEEL 0 18XMFL TIE 17 (SUTURE) IMPLANT
SUT VIC AB 0 CT1 18XCR BRD8 (SUTURE) ×2 IMPLANT
SUT VIC AB 0 CT1 8-18 (SUTURE)
SUT VIC AB 2-0 CP2 18 (SUTURE) ×2 IMPLANT
SUT VIC AB 2-0 CT1 18 (SUTURE) ×2 IMPLANT
SUT VIC AB 3-0 SH 8-18 (SUTURE) ×2 IMPLANT
SYR CONTROL 10ML LL (SYRINGE) ×2 IMPLANT
SYSTEM CSF EXTERNAL DRAINAGE (MISCELLANEOUS) ×2 IMPLANT
TOWEL GREEN STERILE (TOWEL DISPOSABLE) ×3 IMPLANT
TOWEL GREEN STERILE FF (TOWEL DISPOSABLE) ×3 IMPLANT
TRAY FOLEY MTR SLVR 16FR STAT (SET/KITS/TRAYS/PACK) ×1 IMPLANT
TUBE CONNECTING 12'X1/4 (SUCTIONS) ×1
TUBE CONNECTING 12X1/4 (SUCTIONS) ×2 IMPLANT
UNDERPAD 30X30 (UNDERPADS AND DIAPERS) ×1 IMPLANT
WATER STERILE IRR 1000ML POUR (IV SOLUTION) ×3 IMPLANT

## 2019-03-28 NOTE — Op Note (Signed)
Procedure(s): [facility] Procedure Note  Brian [name] male 74 y.o. 03/28/2019  Procedure(s) and Anesthesia Type:    Left sided burr holes and placement of subdural drain for evacuation of subdural hematoma  Surgeon(s) and Role:    Marcello Moores, Dorcas Carrow, MD - Primary    Indications: This is a 74 year old man with a history of DVT, subarachnoid hemorrhage status post coiling who had recurrence of the aneurysm and underwent recent clipping of a right ICA aneurysm 3 weeks ago by my partner Dr. Kathyrn Sheriff.  He presented the hospital with progressive confusion and weakness and failure to thrive and was found to have a large chronic left subdural hematoma.  I had a long discussion with the patient's daughter Reita May.  Given the significant mass-effect in the size of the subdural as well as his neurologic decline, I recommended urgent bur hole drainage.  Risks, benefits, alternatives, and expected convalescence were discussed.  Risks discussed included but were not limited to bleeding, pain, infection, seizure, stroke, scar, subdural recurrence, neurologic deficit, coma, and death.  Informed consent was obtained.  Surgeon: Vallarie Mare   Assistants: none.  No qualified trainees were available to assist with the procedure.  Anesthesia: General endotracheal anesthesia   Procedure in detail:  The patient was brought to the operating room.  A timeout was performed.  General anesthesia was induced and patient was intubated by the anesthesia service.  After appropriate lines and monitors were placed, patient's head was turned to the right and scalp was shaved, preprepped with alcohol and cleansing solution and prepped and draped in sterile fashion.  1% lidocaine with epinephrine injected into the planned incisions.  2 incisions were made with a 10 blade just above the superior temporal line, 1 in the frontal region and 1 in the parietal region.  The periosteum was swept off the skull and  self-retaining retractors were placed.  Perforator was used to prep performed bur holes at each incision.  The dura was then coagulated and opened in cruciate manner.  Superficial membrane was coagulated and opened at each bur hole and chronic subdural fluid was encountered.  The subdural space was irrigated thoroughly with good communication between the openings and the irrigation returned clear.  The brain was inspected and appeared to be reexpanding appropriately.  However, as it had not fully returned from its sunken status, and EVD catheter was used as a subdural drain and placed in the posterior bur hole and turned out the skin and secured with a stitch.  The dural defects were covered with Gelfoam and the subdural space was irrigated one last time.  The incisions were closed with 2-0 Vicryl stitches and staples.  The subdural drain was connected to a drainage bag.    Sterile dressing was then placed on the incision.  Patient was then extubated by the anesthesia service moving all extremities.  All counts were correct at the end of surgery.  No complications were noted.  Findings: Large chronic subdural hematoma  Estimated Blood Loss:  < 10 ml         Drains: subdural drain         Specimens: none         Implants: none        Complications:  none         Disposition: To PACU         Condition: stable

## 2019-03-28 NOTE — Anesthesia Procedure Notes (Signed)
Procedure Name: Intubation Date/Time: 03/28/2019 10:38 AM Performed by: Gaylene Brooks, CRNA Pre-anesthesia Checklist: Patient identified, Emergency Drugs available, Suction available and Patient being monitored Patient Re-evaluated:Patient Re-evaluated prior to induction Oxygen Delivery Method: Circle System Utilized Preoxygenation: Pre-oxygenation with 100% oxygen Induction Type: IV induction Ventilation: Mask ventilation without difficulty Laryngoscope Size: Miller and 2 Grade View: Grade I Tube type: Oral Tube size: 7.5 mm Number of attempts: 1 Airway Equipment and Method: Stylet and Oral airway Placement Confirmation: ETT inserted through vocal cords under direct vision,  positive ETCO2 and breath sounds checked- equal and bilateral Secured at: 22 cm Tube secured with: Tape Dental Injury: Teeth and Oropharynx as per pre-operative assessment

## 2019-03-28 NOTE — Anesthesia Postprocedure Evaluation (Signed)
Anesthesia Post Note  Patient: Brian [name]  Procedure(s) Performed: Haskell Flirt for subdural hematoma (Left Head)     Patient location during evaluation: PACU Anesthesia Type: General Level of consciousness: awake and alert Pain management: pain level controlled Vital Signs Assessment: post-procedure vital signs reviewed and stable Respiratory status: spontaneous breathing, nonlabored ventilation, respiratory function stable and patient connected to nasal cannula oxygen Cardiovascular status: blood pressure returned to baseline and stable Postop Assessment: no apparent nausea or vomiting Anesthetic complications: no    Last Vitals:  Vitals:   03/28/19 1300 03/28/19 1400  BP: (!) 140/104 (!) 132/92  Pulse: 81 71  Resp: 14 14  Temp:    SpO2: 100% 98%    Last Pain:  Vitals:   03/28/19 1148  TempSrc:   PainSc: 0-No pain                 Teo Moede DAVID

## 2019-03-28 NOTE — H&P (Signed)
Chief Complaint   Chief Complaint  Patient presents with  . Weakness    History of Present Illness  Brian [name] is a 74 y.o. 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 Kathyrn Sheriff.  He had some mild left sided weakness postop that was improving but for the past ~10 days, he has had increased confusion and has not been eating or drinking.  He was brought to the [facility] and a large left chronic SDH was found.  He was transferred to [facility] and arrived last night hemodynamically stable. He is not on any blood thinners.  Past Medical History   Past Medical History:  Diagnosis Date  . Altered mental status   . BPH (benign prostatic hyperplasia)   . Chronic pain   . Colitis 12/16/2010   Vasculitis  . CVA (cerebral infarction)   . CVA (cerebral vascular accident) ([place])   . DDD (degenerative disc disease), lumbar   . DVT (deep venous thrombosis) ([place])   . Hypertension   . Ileus ([place])   . Left foot drop    4 prong cane, walker  . Limited mobility    needs assistance  . Lower extremity weakness   . Renal disorder   . Sepsis ([place])   . Sepsis(995[address]91)   . Stroke [facility])    left side weakness  . Vascular insufficiency     Past Surgical History   Past Surgical History:  Procedure Laterality Date  . BACK SURGERY  2002/2009  . COLONOSCOPY  12/31/2010  . CRANIOTOMY Right 02/28/2019   Procedure: RIGHT CRANIOTOMY FOR CLIPPING OF INTRACRANIAL ANEURYSM FOR CAROTID;  Surgeon: [name], MD;  Location: [place];  Service: Neurosurgery;  Laterality: Right;  . ELBOW SURGERY Right   . IR ANGIO INTRA EXTRACRAN SEL INTERNAL CAROTID UNI R MOD SED  09/25/2018  . LAPAROTOMY  12/18/2010   Procedure: EXPLORATORY LAPAROTOMY;  Surgeon: Donato Heinz;  Location: [facility];  Service: General;  Laterality: N/A;  . PARTIAL COLECTOMY  12/18/2010   Procedure: PARTIAL COLECTOMY;   Surgeon: Donato Heinz;  Location: [facility];  Service: General;  Laterality: N/A;  . RADIOLOGY WITH ANESTHESIA N/A 02/20/2014   Procedure: RADIOLOGY WITH ANESTHESIA;  Surgeon: [name], MD;  Location: [facility];  Service: Radiology;  Laterality: N/A;    Social History   Social History   Tobacco Use  . Smoking status: Current Every Day Smoker    Packs/day: 0.50    Years: 40.00    Pack years: 20.00    Types: Cigarettes  . Smokeless tobacco: Never Used  Substance Use Topics  . Alcohol use: Not Currently    Comment: Beer  . Drug use: Yes    Types: "Crack" cocaine, Marijuana    Comment: last cocaine use was 02/05/19    Medications   Prior to Admission medications   Medication Sig Start Date End Date Taking? Authorizing Provider  amitriptyline (ELAVIL) 25 MG tablet Take 25 mg by mouth at bedtime.  08/27/18  Yes [provider]  amLODipine (NORVASC) 10 MG tablet Take 10 mg by mouth daily.  02/11/14  Yes [provider]  gabapentin (NEURONTIN) 600 MG tablet Take 600 mg by mouth 2 (two) times daily as needed (pain.).  03/24/15  Yes [provider]  oxyCODONE-acetaminophen (PERCOCET) 7.5-325 MG tablet Take 1 tablet by mouth every 4 (four) hours  as needed. 03/02/19  Yes Costella, Vista Mink, PA-C  VOLTAREN 1 % GEL Apply 1 application topically 4 (four) times daily as needed (pain).  03/24/15  Yes [provider]    Allergies  No Known Allergies  Review of Systems  ROS  Neurologic Exam  Awake, alert, eyes open spontaneously, PERRL.  Speech garbled.  Intermittently can follow simple commands Memory and concentration poor CN grossly intact Anti-gravity with bilateral UEs.  More pronator drift on right than left, but strength exam limited by poor mentation  Imaging  CT head without contrast shows large left hemispheric hypodense subdural hematoma with severe mass effect  Impression  - 74 y.o. man with hx of DVT, DVA, SAH s/p coiling of right ICA  aneurysm and recent clipping 2-3 weeks ago who has a large chronic left subdural hematoma  Plan  - given the significant mass effect and patient's neurologic decline, he will be going to the OR for burr hole evacuation.  - informed consent was obtained from his daughter [name].

## 2019-03-28 NOTE — Transfer of Care (Signed)
Immediate Anesthesia Transfer of Care Note  Patient: Brian [name]  Procedure(s) Performed: Haskell Flirt for subdural hematoma (Left Head)  Patient Location: PACU  Anesthesia Type:General  Level of Consciousness: awake, patient cooperative and confused  Airway & Oxygen Therapy: Patient Spontanous Breathing and Patient connected to nasal cannula oxygen  Post-op Assessment: Report given to RN, Post -op Vital signs reviewed and stable and Patient moving all extremities X 4  Post vital signs: Reviewed and stable  Last Vitals:  Vitals Value Taken Time  BP 146/96 03/28/19 1148  Temp    Pulse 84 03/28/19 1150  Resp 16 03/28/19 1150  SpO2 100 % 03/28/19 1150  Vitals shown include unvalidated device data.  Last Pain:  Vitals:   03/28/19 0800  TempSrc:   PainSc: 0-No pain         Complications: No apparent anesthesia complications

## 2019-03-28 NOTE — Anesthesia Preprocedure Evaluation (Addendum)
Anesthesia Evaluation  Patient identified by MRN, date of birth, ID bandGeneral Assessment Comment:Patient responds to verbal stimulus  Reviewed: Allergy & Precautions, NPO status , Patient's Chart, lab work & pertinent test results  Airway Mallampati: II  TM Distance: >3 FB Neck ROM: Full    Dental  (+) Edentulous Upper, Poor Dentition   Pulmonary Current Smoker and Patient abstained from smoking.,    Pulmonary exam normal breath sounds clear to auscultation       Cardiovascular hypertension, Pt. on medications + DVT  Normal cardiovascular exam Rhythm:Regular Rate:Normal  ECG: SR, Sinus rhythm Abnormal R-wave progression, early transition Left ventricular hypertrophy  ECHO: 1. The left ventricle has a visually estimated ejection fraction of approximately 55%. The cavity size was normal. There is moderately increased left ventricular wall thickness. Left ventricular diastolic Doppler parameters are consistent with impaired  relaxation. No evidence of left ventricular regional wall motion abnormalities. 2. The right ventricle has normal systolic function. The cavity was normal. There is no increase in right ventricular wall thickness. Right ventricular systolic pressure could not be assessed. 3. The aortic valve is tricuspid. Moderate aortic annular calcification noted. 4. The mitral valve is grossly normal. There is mild mitral annular calcification present. 5. The tricuspid valve is grossly normal. 6. The aortic root is normal in size and structure. 7. There is no obvious interatrial shunting with saline bubble contrast.   Neuro/Psych CT head without contrast shows large left hemispheric hypodense subdural hematoma with severe mass effect  CVA, Residual Symptoms negative psych ROS   GI/Hepatic negative GI ROS, Neg liver ROS,   Endo/Other  negative endocrine ROS  Renal/GU negative Renal ROS     Musculoskeletal negative  musculoskeletal ROS (+)   Abdominal   Peds  Hematology  (+) anemia ,   Anesthesia Other Findings left subdural hematoma  Reproductive/Obstetrics                           Anesthesia Physical Anesthesia Plan  ASA: IV  Anesthesia Plan: General   Post-op Pain Management:    Induction: Intravenous  PONV Risk Score and Plan: 1 and Ondansetron, Dexamethasone and Treatment may vary due to age or medical condition  Airway Management Planned: Oral ETT  Additional Equipment: Arterial line  Intra-op Plan:   Post-operative Plan: Possible Post-op intubation/ventilation  Informed Consent: I have reviewed the patients History and Physical, chart, labs and discussed the procedure including the risks, benefits and alternatives for the proposed anesthesia with the patient or authorized representative who has indicated his/her understanding and acceptance.     Dental advisory given  Plan Discussed with: CRNA  Anesthesia Plan Comments:        Anesthesia Quick Evaluation

## 2019-03-29 ENCOUNTER — Encounter: Payer: Self-pay | Admitting: *Deleted

## 2019-03-29 ENCOUNTER — Inpatient Hospital Stay (HOSPITAL_COMMUNITY): Payer: Medicare Other

## 2019-03-29 LAB — BASIC METABOLIC PANEL
Anion gap: 9 (ref 5–15)
BUN: 16 mg/dL (ref 8–23)
CO2: 20 mmol/L — ABNORMAL LOW (ref 22–32)
Calcium: 8.5 mg/dL — ABNORMAL LOW (ref 8.9–10.3)
Chloride: 115 mmol/L — ABNORMAL HIGH (ref 98–111)
Creatinine, Ser: 1.07 mg/dL (ref 0.61–1.24)
GFR calc Af Amer: >60 mL/min (ref >60)
GFR calc non Af Amer: >60 mL/min (ref >60)
Glucose, Bld: 107 mg/dL — ABNORMAL HIGH (ref 70–99)
Potassium: 4.2 mmol/L (ref 3.5–5.1)
Sodium: 144 mmol/L (ref 135–145)

## 2019-03-29 LAB — CBC
HCT: 31.4 % — ABNORMAL LOW (ref 39.0–52.0)
Hemoglobin: 9.6 g/dL — ABNORMAL LOW (ref 13.0–17.0)
MCH: 27.4 pg (ref 26.0–34.0)
MCHC: 30.6 g/dL (ref 30.0–36.0)
MCV: 89.5 fL (ref 80.0–100.0)
Platelets: 188 10*3/uL (ref 150–400)
RBC: 3.51 MIL/uL — ABNORMAL LOW (ref 4.22–5.81)
RDW: 13.7 % (ref 11.5–15.5)
WBC: 6.9 10*3/uL (ref 4.0–10.5)
nRBC: 0 % (ref 0.0–0.2)

## 2019-03-29 MED ORDER — LEVETIRACETAM 500 MG PO TABS
500.0000 mg | ORAL_TABLET | Freq: Two times a day (BID) | ORAL | Status: DC
  Administered 2019-03-29 – 2019-04-03 (×11): 500 mg via ORAL
  Filled 2019-03-29 (×11): qty 1

## 2019-03-29 MED ORDER — PANTOPRAZOLE SODIUM 40 MG PO TBEC
40.0000 mg | DELAYED_RELEASE_TABLET | Freq: Every day | ORAL | Status: DC
  Administered 2019-03-29 – 2019-04-03 (×6): 40 mg via ORAL
  Filled 2019-03-29 (×6): qty 1

## 2019-03-29 MED ORDER — HEPARIN SODIUM (PORCINE) 5000 UNIT/ML IJ SOLN
5000.0000 [IU] | Freq: Two times a day (BID) | INTRAMUSCULAR | Status: DC
  Administered 2019-03-30 – 2019-04-03 (×8): 5000 [IU] via SUBCUTANEOUS
  Filled 2019-03-29 (×8): qty 1
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
## 2019-03-30 NOTE — Plan of Care (Signed)
  Problem: Activity: Goal: Risk for activity intolerance will decrease Outcome: Progressing   

## 2019-03-30 NOTE — Progress Notes (Signed)
Patient ID: Brian [name], male   DOB: [birth year], 74 y.o.   MRN: [id number] Subjective: Patient reports no headche  Objective: Vital signs in last 24 hours: Temp:  [97.6 F (36.4 C)-98.8 F (37.1 C)] 97.7 F (36.5 C) (02/06 0800) Pulse Rate:  [67-107] 84 (02/06 0800) Resp:  [13-22] 20 (02/06 0800) BP: (122-157)/(72-119) 122/97 (02/06 0800) SpO2:  [94 %-100 %] 100 % (02/06 0800)  Intake/Output from previous day: 02/05 0701 - 02/06 0700 In: 2908.2 [P.O.:240; I.V.:2668.2] Out: 62 [Urine:5300; Drains:40] Intake/Output this shift: Total I/O In: 250 [I.V.:250] Out: -   Awake, regards examiner, FC, MAEx4, dressings dry, not overly cooperative (pulls cover over head)  Lab Results: Lab Results  Component Value Date   WBC 6.9 03/29/2019   HGB 9.6 (L) 03/29/2019   HCT 31.4 (L) 03/29/2019   MCV 89.5 03/29/2019   PLT 188 03/29/2019   Lab Results  Component Value Date   INR 1.1 03/27/2019   BMET Lab Results  Component Value Date   NA 144 03/29/2019   K 4.2 03/29/2019   CL 115 (H) 03/29/2019   CO2 20 (L) 03/29/2019   GLUCOSE 107 (H) 03/29/2019   BUN 16 03/29/2019   CREATININE 1.07 03/29/2019   CALCIUM 8.5 (L) 03/29/2019    Studies/Results: CT HEAD WO CONTRAST  Result Date: 03/29/2019 CLINICAL DATA:  74 year old male subdural hematoma status post drainage. Postoperative day 1 EXAM: CT HEAD WITHOUT CONTRAST TECHNIQUE: Contiguous axial images were obtained from the base of the skull through the vertex without intravenous contrast. COMPARISON:  Preoperative head CT 02/21/2020. FINDINGS: Brain: Left subdural drain now in place. There is a mix of residual low-density left subdural hematoma and subdural space pneumocephalus. This is 8-9 mm in thickness, versus 17 mm previously. Intracranial mass effect is decreased. Rightward midline shift has decreased from up to 15 mm preoperatively to now 10 mm. Improved appearance of both lateral ventricles, some residual right lateral  ventricle transependymal edema is possible. Basilar cisterns are patent. No new intracranial hemorrhage identified. Chronic right MCA infarct with encephalomalacia. Stable gray-white matter differentiation throughout the brain. Vascular: Right ICA terminus or MCA region surgical aneurysm clip and coil pack. Calcified atherosclerosis at the skull base. Skull: Previous right frontotemporal craniotomy new left superior calvarium burr holes. No other No acute osseous abnormality identified. Sinuses/Orbits: Visualized paranasal sinuses and mastoids are stable and well pneumatized. Other: Scalp soft tissue postoperative changes on the left. Visualized orbit soft tissues are within normal limits. IMPRESSION: 1. Left subdural drain in place with smaller left side subdural hematoma with some superimposed pneumocephalus. Residual up to 9 mm in thickness. 2. Improved intracranial mass effect and decreased rightward midline shift now 10 mm. 3. No new intracranial abnormality. Underlying chronic right MCA territory infarct and sequelae of right anterior circulation aneurysm clipping. Electronically Signed   By: Genevie Ann M.D.   On: 03/29/2019 08:02    Assessment/Plan: Seems stable, mobilize, drain out tomorrow, PT/OT  Estimated body mass index is 24.59 kg/m as calculated from the following:   Height as of this encounter: 5\' 7"  (1.702 m).   Weight as of this encounter: 71.2 kg.    LOS: 3 days    Eustace Moore 03/30/2019, 8:46 AM

## 2019-03-30 NOTE — Plan of Care (Signed)
  Problem: Clinical Measurements: Goal: Ability to maintain clinical measurements within normal limits will improve Outcome: Progressing   

## 2019-03-31 NOTE — Progress Notes (Signed)
Subjective: Patient reports no headaches, but states "I need to eat"  Objective: Vital signs in last 24 hours: Temp:  [97.5 F (36.4 C)-98.5 F (36.9 C)] 98 F (36.7 C) (02/07 0800) Pulse Rate:  [81-102] 84 (02/07 0800) Resp:  [14-29] 21 (02/07 0800) BP: (102-171)/(75-113) 131/83 (02/07 0800) SpO2:  [93 %-100 %] 100 % (02/07 0800)  Intake/Output from previous day: 02/06 0701 - 02/07 0700 In: 2660.1 [P.O.:600; I.V.:2060.1] Out: 7 [Drains:7] Intake/Output this shift: Total I/O In: 600 [I.V.:600] Out: -   Neurologic: Grossly normal  Lab Results: Lab Results  Component Value Date   WBC 6.9 03/29/2019   HGB 9.6 (L) 03/29/2019   HCT 31.4 (L) 03/29/2019   MCV 89.5 03/29/2019   PLT 188 03/29/2019   Lab Results  Component Value Date   INR 1.1 03/27/2019   BMET Lab Results  Component Value Date   NA 144 03/29/2019   K 4.2 03/29/2019   CL 115 (H) 03/29/2019   CO2 20 (L) 03/29/2019   GLUCOSE 107 (H) 03/29/2019   BUN 16 03/29/2019   CREATININE 1.07 03/29/2019   CALCIUM 8.5 (L) 03/29/2019    Studies/Results: No results found.  Assessment/Plan: 74 year old patient postop day 3 crani for subdural hematoma. Doing well, stable. No acute changes overnight. Drain discontinued today. Continue therapies   LOS: 4 days    Ocie Cornfield [facility] 03/31/2019, 9:46 AM

## 2019-04-01 NOTE — Progress Notes (Signed)
Updated family member on patient status after password was given. Voiced understanding. No additional questions asked.

## 2019-04-01 NOTE — Progress Notes (Signed)
Subjective: Patient reports no headaches Objective: Vital signs in last 24 hours: Temp:  [98.1 F (36.7 C)-98.8 F (37.1 C)] 98.5 F (36.9 C) (02/08 0724) Pulse Rate:  [77-101] 89 (02/08 0724) Resp:  [14-20] 18 (02/08 0724) BP: (98-142)/(67-105) 111/79 (02/08 0724) SpO2:  [91 %-100 %] 91 % (02/07 2339)  Intake/Output from previous day: 02/07 0701 - 02/08 0700 In: 3008.3 [P.O.:920; I.V.:2088.3] Out: 875 [Urine:875] Intake/Output this shift: Total I/O In: 540 [P.O.:240; I.V.:300] Out: -   Awake, alert Eyes open spont, PERRL.  FC x 4, mild bilateral pronator drift.  Oriented to person.  Lab Results: No results for input(s): WBC, HGB, HCT, PLT in the last 72 hours. BMET No results for input(s): NA, K, CL, CO2, GLUCOSE, BUN, CREATININE, CALCIUM in the last 72 hours.  Studies/Results: No results found.  Assessment/Plan: S/p burr hole drainage of SDH  LOS: 5 days  - SNF vs home   Vallarie Mare 04/01/2019, 9:20 AM

## 2019-04-01 NOTE — Progress Notes (Signed)
Physical Therapy Treatment Patient Details Name: Brian [name] MRN: [id number] DOB: August 01, 1945 Today's Date: 04/01/2019    History of Present Illness Brian [name] is a 74 y.o. 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 Kathyrn Sheriff. He had some mild left sided weakness postop that was improving but for the past ~10 days, he has had increased confusion and has not been eating or drinking.  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    PT Comments    Patient progressing slowly but able to take a couple steps.  Demonstrates L LE internal rotation and adduction limiting gait and safety with transfers.  He will need skilled caregivers for safety with mobility upon d/c.  Appropriate for SNF level rehab if caregivers at home unable to provide adequate assistance.  PT to follow acutely.    Follow Up Recommendations  Supervision/Assistance - 24 hour;SNF(depending on level of assist at home and prior functional level)     Equipment Recommendations  None recommended by PT    Recommendations for Other Services       Precautions / Restrictions Precautions Precautions: Fall    Mobility  Bed Mobility Overal bed mobility: Needs Assistance Bed Mobility: Supine to Sit     Supine to sit: Mod assist;HOB elevated     General bed mobility comments: assist to guide legs and lift trunk  Transfers Overall transfer level: Needs assistance Equipment used: Rolling walker (2 wheeled) Transfers: Sit to/from Stand Sit to Stand: Mod assist;+2 physical assistance            Ambulation/Gait Ambulation/Gait assistance: Mod assist;+2 physical assistance Gait Distance (Feet): 4 Feet Assistive device: Rolling walker (2 wheeled) Gait Pattern/deviations: Decreased step length - left;Decreased stride length;Trunk flexed;Scissoring     General Gait Details:  flexed posture throughout, completely stepped R foot around the L and needed help to get L foot untangled, support for balance and posture and encouraged to continue attempt, but pt requesting to sit   Stairs             Wheelchair Mobility    Modified Rankin (Stroke Patients Only) Modified Rankin (Stroke Patients Only) Pre-Morbid Rankin Score: Moderate disability(unknown baseline, has aides) Modified Rankin: Moderately severe disability     Balance Overall balance assessment: Needs assistance Sitting-balance support: Feet supported Sitting balance-Leahy Scale: Fair     Standing balance support: Bilateral upper extremity supported Standing balance-Leahy Scale: Poor Standing balance comment: bilat UE support for balance, flexed posture throughout               High Level Balance Comments: attempted standing at counter in room for upright posture and standing weight shifting, pt unable to keep legs uncrossed so positioned for him and gave support, but he continued to lean and "dive" toward chair            Cognition Arousal/Alertness: Awake/alert Behavior During Therapy: Flat affect Overall Cognitive Status: No family/caregiver present to determine baseline cognitive functioning                   Orientation Level: Disoriented to;Time;Situation Current Attention Level: Focused Memory: Decreased short-term memory Following Commands: Follows one step commands inconsistently;Follows one step commands with increased time Safety/Judgement: Decreased awareness of safety;Decreased awareness of deficits   Problem Solving: Slow processing;Decreased initiation;Difficulty sequencing;Requires verbal cues;Requires tactile cues  Exercises Other Exercises Other Exercises: seated L LE stretch into abduction and Ext rotation    General Comments        Pertinent Vitals/Pain Pain Assessment: No/denies pain    Home Living                       Prior Function            PT Goals (current goals can now be found in the care plan section) Progress towards PT goals: Progressing toward goals    Frequency    Min 3X/week      PT Plan Current plan remains appropriate    Co-evaluation              AM-PAC PT "6 Clicks" Mobility   Outcome Measure  Help needed turning from your back to your side while in a flat bed without using bedrails?: A Little Help needed moving from lying on your back to sitting on the side of a flat bed without using bedrails?: A Lot Help needed moving to and from a bed to a chair (including a wheelchair)?: A Lot Help needed standing up from a chair using your arms (e.g., wheelchair or bedside chair)?: A Lot Help needed to walk in hospital room?: Total Help needed climbing 3-5 steps with a railing? : Total 6 Click Score: 11    End of Session   Activity Tolerance: Patient limited by fatigue Patient left: in chair;with call bell/phone within reach;with chair alarm set   PT Visit Diagnosis: Other abnormalities of gait and mobility (R26.89);Difficulty in walking, not elsewhere classified (R26.2);Other symptoms and signs involving the nervous system [id number])     Time: [id number] PT Time Calculation (min) (ACUTE ONLY): 16 min  Charges:  $Gait Training: 8-22 mins                     Magda Kiel, [place] [phone] 04/01/2019    Reginia Naas 04/01/2019, 5:58 PM

## 2019-04-02 NOTE — Progress Notes (Signed)
Physical Therapy Treatment Patient Details Name: Brian [name] MRN: [id number] DOB: May 21, 1945 Today's Date: 04/02/2019    History of Present Illness Brian [name] is a 74 y.o. 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 Kathyrn Sheriff. He had some mild left sided weakness postop that was improving but for the past ~10 days, he has had increased confusion and has not been eating or drinking.  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    PT Comments    Patient with limited progress due to decreased activity tolerance in sitting, but could be behavioral with fear limiting as well.  Feel continued skilled PT indicated for further monitoring for mobility tolerance and progression in standing.  Remains appropriate for SNF level rehab at d/c.    Follow Up Recommendations  Supervision/Assistance - 24 hour;SNF     Equipment Recommendations  None recommended by PT    Recommendations for Other Services       Precautions / Restrictions Precautions Precautions: Fall    Mobility  Bed Mobility Overal bed mobility: Needs Assistance Bed Mobility: Supine to Sit;Sit to Supine     Supine to sit: Mod assist;+2 for safety/equipment;HOB elevated Sit to supine: Min assist   General bed mobility comments: assist to initiate for legs off bed and to lift trunk as pt when given time loses focus and places cover back over himself after moving his leg slightly closer to EOB. to supine assist for L LE into bed  Transfers Overall transfer level: Needs assistance Equipment used: Rolling walker (2 wheeled) Transfers: Sit to/from Stand Sit to Stand: Mod assist;From elevated surface         General transfer comment: stood with much encouragement to attept to step closer to [facility] as pt refusing and wanting to lie back down, but unable to take steps and just returned  to sitting  Ambulation/Gait                 Stairs             Wheelchair Mobility    Modified Rankin (Stroke Patients Only) Modified Rankin (Stroke Patients Only) Pre-Morbid Rankin Score: Moderate disability Modified Rankin: Moderately severe disability     Balance Overall balance assessment: Needs assistance Sitting-balance support: Feet supported Sitting balance-Leahy Scale: Fair Sitting balance - Comments: but support given as pt attempting to lie back down     Standing balance-Leahy Scale: Poor Standing balance comment: UE support and assist for balance                            Cognition Arousal/Alertness: Awake/alert Behavior During Therapy: Impulsive Overall Cognitive Status: No family/caregiver present to determine baseline cognitive functioning Area of Impairment: Orientation;Attention;Memory;Safety/judgement;Following commands;Problem solving                 Orientation Level: Disoriented to;Place;Time;Situation Current Attention Level: Focused Memory: Decreased short-term memory Following Commands: Follows one step commands inconsistently;Follows one step commands with increased time Safety/Judgement: Decreased awareness of safety;Decreased awareness of deficits   Problem Solving: Slow processing;Decreased initiation;Difficulty sequencing;Requires verbal cues;Requires tactile cues        Exercises      General Comments General comments (skin integrity, edema, etc.): BP measurement in sitting 128/104 (but pt moving throughout)  Pertinent Vitals/Pain Pain Assessment: Faces Faces Pain Scale: No hurt    Home Living                      Prior Function            PT Goals (current goals can now be found in the care plan section) Progress towards PT goals: Progressing toward goals    Frequency    Min 3X/week      PT Plan Current plan remains appropriate    Co-evaluation               AM-PAC PT "6 Clicks" Mobility   Outcome Measure  Help needed turning from your back to your side while in a flat bed without using bedrails?: A Little Help needed moving from lying on your back to sitting on the side of a flat bed without using bedrails?: A Lot Help needed moving to and from a bed to a chair (including a wheelchair)?: A Lot Help needed standing up from a chair using your arms (e.g., wheelchair or bedside chair)?: A Lot Help needed to walk in hospital room?: Total Help needed climbing 3-5 steps with a railing? : Total 6 Click Score: 11    End of Session Equipment Utilized During Treatment: Gait belt Activity Tolerance: Other (comment)(limited due to participation) Patient left: in bed;with call bell/phone within reach;with bed alarm set;with restraints reapplied   PT Visit Diagnosis: Other abnormalities of gait and mobility (R26.89);Difficulty in walking, not elsewhere classified (R26.2);Other symptoms and signs involving the nervous system (R29.898)     Time: 1532-1550 PT Time Calculation (min) (ACUTE ONLY): 18 min  Charges:  $Therapeutic Activity: 8-22 mins                     Magda Kiel, [place] Acute Rehabilitation Services [phone] 04/02/2019'   Reginia Naas 04/02/2019, 5:57 PM

## 2019-04-02 NOTE — Progress Notes (Signed)
Occupational Therapy Treatment Patient Details Name: Brian [name] MRN: [id number] DOB: 1945-12-30 Today's Date: 04/02/2019    History of present illness Brian [name] is a 74 y.o. 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 Kathyrn Sheriff. He had some mild left sided weakness postop that was improving but for the past ~10 days, he has had increased confusion and has not been eating or drinking.  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   OT comments  Pt progressing to OOB ADL and transfer. Pt modA +2 overall for hand held transfer stand pivot from bed to recliner. Pt requiring facilitation of LLE blocking to reduce buckling. Pt performing self feeding with set-up/supervisionA. Attempting cognitive questions for simple and multi step; pt requiring increased time and very poor attention. Pt very lethargic and finally arousable after sitting EOB. Pt would benefit greatly from continued OT skilled services. OT following acutely for cognition, vision and continued mobility.    Follow Up Recommendations  SNF;Supervision/Assistance - 24 hour    Equipment Recommendations  Other (comment)(to be determined)    Recommendations for Other Services      Precautions / Restrictions Precautions Precautions: Fall Precaution Comments: mitts on BUEs Restrictions Weight Bearing Restrictions: No       Mobility Bed Mobility Overal bed mobility: Needs Assistance Bed Mobility: Supine to Sit     Supine to sit: Mod assist;HOB elevated        Transfers Overall transfer level: Needs assistance Equipment used: 2 person hand held assist Transfers: Sit to/from [facility] Sit to Stand: Mod assist;+2 physical assistance Stand pivot transfers: Mod assist;+2 physical assistance       General transfer comment: +2 modA for LLE knee blocked to  avoid buckling    Balance Overall balance assessment: Needs assistance Sitting-balance support: Feet supported Sitting balance-Leahy Scale: Fair     Standing balance support: Bilateral upper extremity supported Standing balance-Leahy Scale: Poor Standing balance comment: +2 modA for stability. LLE requires blocking and facilitation of movement and extension for transfer                           ADL either performed or assessed with clinical judgement   ADL Overall ADL's : Needs assistance/impaired Eating/Feeding: Set up;Supervision/ safety;Sitting Eating/Feeding Details (indicate cue type and reason): Opened containers; pt began feeding self and finding silverware. pt unable to answer if he wanted salt or pepper.                                 Functional mobility during ADLs: Moderate assistance;+2 for physical assistance;+2 for safety/equipment;Cueing for safety;Cueing for sequencing General ADL Comments: pt limited by cognitive deficits with awareness, safety and poor processing, L>R weakness, and decreased activity tolerance     Vision   Vision Assessment?: Vision impaired- to be further tested in functional context Additional Comments: Pt unable to track therapist due to poor command follow and inattention.   Perception     Praxis      Cognition Arousal/Alertness: Awake/alert Behavior During Therapy: Flat affect Overall Cognitive Status: No family/caregiver present to determine baseline cognitive functioning Area of Impairment: Orientation;Attention;Memory;Following commands;Safety/judgement;Awareness;Problem solving  Orientation Level: Disoriented to;Time;Situation;Person;Place Current Attention Level: Focused Memory: Decreased short-term memory Following Commands: Follows one step commands inconsistently;Follows one step commands with increased time Safety/Judgement: Decreased awareness of safety;Decreased awareness of  deficits Awareness: Intellectual   General Comments: Pt very lethargic and pt with poor ability to follow commands. Pt stating "I need to lie down.'        Exercises     Shoulder Instructions       General Comments      Pertinent Vitals/ Pain       Pain Assessment: Faces Faces Pain Scale: Hurts a little bit Pain Descriptors / Indicators: Grimacing Pain Intervention(s): Limited activity within patient's tolerance  Home Living                                          Prior Functioning/Environment              Frequency  Min 2X/week        Progress Toward Goals  OT Goals(current goals can now be found in the care plan section)  Progress towards OT goals: Progressing toward goals  Acute Rehab OT Goals Patient Stated Goal: eat lunch OT Goal Formulation: With patient Time For Goal Achievement: 04/12/19 Potential to Achieve Goals: Good ADL Goals Pt Will Perform Grooming: with min assist;standing Pt Will Transfer to Toilet: with min assist;stand pivot transfer;bedside commode Additional ADL Goal #1: Pt will follow 3/5 one step commands for incrased BADL engagement Additional ADL Goal #2: Pt will demonstrate sustained attention for increased engagement in BADL  Plan Discharge plan remains appropriate    Co-evaluation                 AM-PAC OT "6 Clicks" Daily Activity     Outcome Measure   Help from another person eating meals?: A Little Help from another person taking care of personal grooming?: A Little Help from another person toileting, which includes using toliet, bedpan, or urinal?: A Lot Help from another person bathing (including washing, rinsing, drying)?: A Lot Help from another person to put on and taking off regular upper body clothing?: A Lot Help from another person to put on and taking off regular lower body clothing?: A Lot 6 Click Score: 14    End of Session Equipment Utilized During Treatment: Gait belt  OT Visit  Diagnosis: Unsteadiness on feet (R26.81);Other abnormalities of gait and mobility (R26.89);Muscle weakness (generalized) (M62.81);Other symptoms and signs involving cognitive function   Activity Tolerance Patient tolerated treatment well   Patient Left in chair;with call bell/phone within reach;with chair alarm set   Nurse Communication Mobility status        Time: 1240-1317 OT Time Calculation (min): 37 min  Charges: OT General Charges $OT Visit: 1 Visit OT Treatments $Self Care/Home Management : 8-22 mins $Neuromuscular Re-education: 8-22 mins  Jefferey Pica, OTR/L Acute Rehabilitation Services Pager: [phone] Office: [place] 04/02/2019, 3:33 PM

## 2019-04-02 NOTE — Progress Notes (Signed)
  NEUROSURGERY PROGRESS NOTE   Updated daughter Denman George.  Plan to discharge tomorrow am with [facility].  Ferne Reus, PA-C [place] [facility] [employer]

## 2019-04-02 NOTE — Progress Notes (Signed)
  NEUROSURGERY PROGRESS NOTE   No issues overnight.  "I want to go home"  EXAM:  BP 112/86 (BP Location: Right Arm)   Pulse 91   Temp 98.6 F (37 C) (Oral)   Resp 20   Ht 5\' 7"  (1.702 m)   Wt 71.2 kg   SpO2 99%   BMI 24.59 kg/m   Awake, alert Oriented to self, location but not year Moves all extremities well, baseline left hemiparesis Bilateral drift Incision: c/d/i  IMPRESSION/PLAN 74 y.o. male POD #5 left burr hole for evacuation of left subdural hematoma. Appears grossly at neurologic baseline. - SNF vs. Home with HH. Will call family to discuss further. If they believe they can care for him at home, will discharge today.

## 2019-04-03 MED ORDER — LEVETIRACETAM 500 MG PO TABS: 500.0000 mg | ORAL_TABLET | Freq: Two times a day (BID) | ORAL | 2 refills | Status: DC

## 2019-04-03 NOTE — TOC Transition Note (Signed)
Transition of Care [facility]) - CM/SW Discharge Note Marvetta Gibbons RN,BSN Transitions of Care Unit 4NP (non trauma) - RN Case Manager [phone]   Patient Details  Name: Brian [name] MRN: [id number] Date of Birth: 09-01-1945  Transition of Care [facility]) CM/SW Contact:  Dawayne Patricia, RN Phone Number: 04/03/2019, 12:14 PM   Clinical Narrative:    Pt stable for transition home today with family, Orders placed for HHPT/OT, call made to daughter Brian [name] to discuss transition plan- confirmed that family does not want STSNF placement and prefers pt to return home- per [place] pt lives at home with son- has all needed DME- pt also has aide that comes 7days/week- she is unsure how many hours but thinks it is 8hrs/day. Brian [name] reports that either aide or family will transport home. Discussed recommendations by PT/OT for SNF and assistance pt is needing at this time- daughter states that they can provide needed assistance in the home with [facility]- choice offered for [facility] agency- Per CMS guidelines from medicare.gov website with star ratings (copy placed in shadow chart)- daughter states she does not have a preference- pt has had [place] in recent past but does not remember name- per epic pt had [facility] in Jan of this year. Call made to [facility] with [facility] for [facility] referral- [facility] has [facility] back for [facility] needs PT/OT- and can do start of care this Friday Feb. 12.   Final next level of care: [place] Barriers to Discharge: No Barriers Identified   Patient Goals and CMS Choice Patient states their goals for this hospitalization and ongoing recovery are:: return home CMS Medicare.gov Compare Post Acute Care list provided to:: Patient Represenative (must comment)(daughter) Choice offered to / list presented to : Adult Children  Discharge Placement                 Home with [facility]      Discharge Plan and Services   Discharge Planning Services: CM Consult Post Acute Care Choice: Home Health,  Resumption of Svcs/PTA Provider          DME Arranged: N/A DME Agency: NA       HH Arranged: PT, OT [place] Agency: [place] ([facility]) Date [place]: 04/03/19 Time [place]: 1041 Representative spoke with at [place]: [place] ([place]) Interventions     Readmission Risk Interventions Readmission Risk Prevention Plan 04/03/2019 06/29/2018  Post Dischage Appt - Complete  Medication Screening - Complete  Transportation Screening Complete Complete  PCP or Specialist Appt within 5-7 Days Complete -  Home Care Screening Complete -  Medication Review (RN CM) Complete -  Some recent data might be hidden

## 2019-04-03 NOTE — Progress Notes (Signed)
Discharge instruction given to Denman George the patients daughter. She stated she understood. Patients PIVs removed from left arm. Patient tolerated well. Patent assisted to POV via WC. Katherina Right RN

## 2019-04-03 NOTE — Progress Notes (Signed)
  NEUROSURGERY PROGRESS NOTE   No issues overnight.  Eager for discharge home.  EXAM:  BP 106/77 (BP Location: Right Arm)   Pulse 92   Temp 98.1 F (36.7 C) (Oral)   Resp 18   Ht 5\' 7"  (1.702 m)   Wt 71.2 kg   SpO2 94%   BMI 24.59 kg/m   Awake, alert Oriented to self, location but not year Moves all extremities well, baseline left hemiparesis Bilateral drift Incision: c/d/i  IMPRESSION/PLAN 74 y.o. male POD #6 left burr hole for evacuation of left subdural hematoma. Appears grossly at neurologic baseline. - plan to discharge home with [facility] today

## 2019-04-03 NOTE — Care Management Important Message (Signed)
Important Message  Patient Details  Name: Brian [name] MRN: [id number] Date of Birth: 1945-08-28   Medicare Important Message Given:  Yes     Memory Argue 04/03/2019, 11:08 AM

## 2019-04-03 NOTE — Discharge Summary (Signed)
Physician Discharge Summary  Patient ID: Brian [name] MRN: [id number] DOB/AGE: [birth year] 72 y.o.  Admit date: 03/27/2019 Discharge date: 04/03/2019  Admission Diagnoses:  SDH  Discharge Diagnoses:  Same Active Problems:   Subdural hematoma (HCC)   Chronic subdural hematoma [facility])  Discharged Condition: Stable  Hospital Course:  Brian [name] is a 74 y.o. male Status post clipping of right ICA aneurysm approximately 1 month ago who presented to the emergency room on 03/27/2019 with worsening confusion and lack of appetite.  He underwent workup by ED PE and was found to have a fairly large left chronic subdural hematoma.  He was admitted to the neuro ICU for further management and treatment.  He was at neurologic baseline with chronic left hemiparesis.  He underwent left-sided bur hole for evacuation of subdural hematoma on 03/28/2019 by Dr. Maisie Fus.  He had a complicated hospital course.  He worked with Physical and Occupational therapy.  It was recommended that he either go home with 247 care or if discharged to skilled nursing facility.  I discussed the situation with his family.  They elected to take him back all with home health.  At time of discharge, pain was well controlled, ambulating with Pt/OT, tolerating po, voiding normal. Ready for discharge.  Treatments: Surgery Left sided burr holes and placement of subdural drain for evacuation of subdural hematoma  Discharge Exam: Blood pressure 106/77, pulse 92, temperature 98.1 F (36.7 C), temperature source Oral, resp. rate 18, height 5\' 7"  (1.702 m), weight 71.2 kg, SpO2 94 %. Awake, alert, oriented to self and location Speech fluent, appropriate CN grossly intact Chronic left hemiparesis Wound c/d/i  Disposition: Discharge disposition: 01-Home or Self Care       Discharge Instructions    Call MD for:  difficulty breathing, headache or visual disturbances   Complete by: As directed    Call MD for:  persistant  dizziness or light-headedness   Complete by: As directed    Call MD for:  redness, tenderness, or signs of infection (pain, swelling, redness, odor or green/yellow discharge around incision site)   Complete by: As directed    Call MD for:  severe uncontrolled pain   Complete by: As directed    Call MD for:  temperature >100.4   Complete by: As directed    Diet general   Complete by: As directed    Driving Restrictions   Complete by: As directed    Do not drive until given clearance.   Increase activity slowly   Complete by: As directed    Lifting restrictions   Complete by: As directed    Do not lift anything >10lbs. Avoid bending and twisting in awkward positions. Avoid bending at the back.   May shower / Bathe   Complete by: As directed    In 24 hours. Okay to wash wound with warm soapy water. Avoid scrubbing the wound. Pat dry.   Remove dressing in 24 hours   Complete by: As directed      Allergies as of 04/03/2019   No Known Allergies     Medication List    TAKE these medications   amitriptyline 25 MG tablet Commonly known as: ELAVIL Take 25 mg by mouth at bedtime.   amLODipine 10 MG tablet Commonly known as: NORVASC Take 10 mg by mouth daily.   gabapentin 600 MG tablet Commonly known as: NEURONTIN Take 600 mg by mouth 2 (two) times daily as needed (pain.).   levETIRAcetam 500 MG tablet  Commonly known as: Keppra Take 1 tablet (500 mg total) by mouth 2 (two) times daily.   oxyCODONE-acetaminophen 7.5-325 MG tablet Commonly known as: Percocet Take 1 tablet by mouth every 4 (four) hours as needed.   Voltaren 1 % Gel Generic drug: diclofenac Sodium Apply 1 application topically 4 (four) times daily as needed (pain).      Follow-up Information    Lisbeth Renshaw, MD. Schedule an appointment as soon as possible for a visit in 1 week(s).   Specialty: Neurosurgery Contact information: [address] [place] [address] [phone]            Signed: Alyson Ingles 04/03/2019, 8:44 AM

## 2019-04-23 ENCOUNTER — Other Ambulatory Visit: Payer: Self-pay | Admitting: Physician Assistant

## 2019-04-23 DIAGNOSIS — S065X9A Traumatic subdural hemorrhage with loss of consciousness of unspecified duration, initial encounter: Secondary | ICD-10-CM

## 2019-04-23 DIAGNOSIS — S065XAA Traumatic subdural hemorrhage with loss of consciousness status unknown, initial encounter: Secondary | ICD-10-CM

## 2019-04-24 ENCOUNTER — Other Ambulatory Visit: Payer: Medicare Other

## 2019-06-18 ENCOUNTER — Other Ambulatory Visit: Payer: Self-pay | Admitting: Physician Assistant

## 2019-06-18 DIAGNOSIS — S065X9A Traumatic subdural hemorrhage with loss of consciousness of unspecified duration, initial encounter: Secondary | ICD-10-CM

## 2019-06-18 DIAGNOSIS — S065XAA Traumatic subdural hemorrhage with loss of consciousness status unknown, initial encounter: Secondary | ICD-10-CM

## 2019-06-25 DIAGNOSIS — G8194 Hemiplegia, unspecified affecting left nondominant side: Secondary | ICD-10-CM | POA: Diagnosis not present

## 2019-06-25 DIAGNOSIS — I639 Cerebral infarction, unspecified: Secondary | ICD-10-CM | POA: Diagnosis not present

## 2019-07-03 DIAGNOSIS — Z7409 Other reduced mobility: Secondary | ICD-10-CM | POA: Diagnosis not present

## 2019-07-03 DIAGNOSIS — M4716 Other spondylosis with myelopathy, lumbar region: Secondary | ICD-10-CM | POA: Diagnosis not present

## 2019-07-03 DIAGNOSIS — I6990 Unspecified sequelae of unspecified cerebrovascular disease: Secondary | ICD-10-CM | POA: Diagnosis not present

## 2019-07-03 DIAGNOSIS — M21372 Foot drop, left foot: Secondary | ICD-10-CM | POA: Diagnosis not present

## 2019-07-03 DIAGNOSIS — I10 Essential (primary) hypertension: Secondary | ICD-10-CM | POA: Diagnosis not present

## 2019-07-03 DIAGNOSIS — G5621 Lesion of ulnar nerve, right upper limb: Secondary | ICD-10-CM | POA: Diagnosis not present

## 2019-07-17 DIAGNOSIS — I10 Essential (primary) hypertension: Secondary | ICD-10-CM | POA: Diagnosis not present

## 2019-07-25 DIAGNOSIS — R69 Illness, unspecified: Secondary | ICD-10-CM | POA: Diagnosis not present

## 2019-07-26 DIAGNOSIS — G8194 Hemiplegia, unspecified affecting left nondominant side: Secondary | ICD-10-CM | POA: Diagnosis not present

## 2019-07-26 DIAGNOSIS — I639 Cerebral infarction, unspecified: Secondary | ICD-10-CM | POA: Diagnosis not present

## 2019-08-08 DIAGNOSIS — J449 Chronic obstructive pulmonary disease, unspecified: Secondary | ICD-10-CM | POA: Diagnosis not present

## 2019-08-11 ENCOUNTER — Other Ambulatory Visit: Payer: Self-pay

## 2019-08-11 ENCOUNTER — Encounter (HOSPITAL_COMMUNITY): Payer: Self-pay | Admitting: *Deleted

## 2019-08-11 DIAGNOSIS — I10 Essential (primary) hypertension: Secondary | ICD-10-CM | POA: Diagnosis not present

## 2019-08-11 DIAGNOSIS — Z79899 Other long term (current) drug therapy: Secondary | ICD-10-CM | POA: Insufficient documentation

## 2019-08-11 DIAGNOSIS — Y999 Unspecified external cause status: Secondary | ICD-10-CM | POA: Insufficient documentation

## 2019-08-11 DIAGNOSIS — S6991XA Unspecified injury of right wrist, hand and finger(s), initial encounter: Secondary | ICD-10-CM | POA: Diagnosis present

## 2019-08-11 DIAGNOSIS — S61011A Laceration without foreign body of right thumb without damage to nail, initial encounter: Secondary | ICD-10-CM | POA: Diagnosis not present

## 2019-08-11 DIAGNOSIS — F17210 Nicotine dependence, cigarettes, uncomplicated: Secondary | ICD-10-CM | POA: Diagnosis not present

## 2019-08-11 DIAGNOSIS — Y929 Unspecified place or not applicable: Secondary | ICD-10-CM | POA: Diagnosis not present

## 2019-08-11 DIAGNOSIS — R69 Illness, unspecified: Secondary | ICD-10-CM | POA: Diagnosis not present

## 2019-08-11 DIAGNOSIS — W260XXA Contact with knife, initial encounter: Secondary | ICD-10-CM | POA: Diagnosis not present

## 2019-08-11 DIAGNOSIS — Y939 Activity, unspecified: Secondary | ICD-10-CM | POA: Insufficient documentation

## 2019-08-11 DIAGNOSIS — R58 Hemorrhage, not elsewhere classified: Secondary | ICD-10-CM | POA: Diagnosis not present

## 2019-08-11 DIAGNOSIS — S61411A Laceration without foreign body of right hand, initial encounter: Secondary | ICD-10-CM | POA: Diagnosis not present

## 2019-08-11 DIAGNOSIS — F1210 Cannabis abuse, uncomplicated: Secondary | ICD-10-CM | POA: Insufficient documentation
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
## 2019-08-12 ENCOUNTER — Emergency Department (HOSPITAL_COMMUNITY): Payer: Medicare HMO

## 2019-08-12 ENCOUNTER — Emergency Department (HOSPITAL_COMMUNITY)
Admission: EM | Admit: 2019-08-12 | Discharge: 2019-08-12 | Disposition: A | Discharge status: Home / Self Care | Payer: Medicare HMO | Attending: Emergency Medicine | Admitting: Emergency Medicine

## 2019-08-12 DIAGNOSIS — S61411A Laceration without foreign body of right hand, initial encounter: Secondary | ICD-10-CM | POA: Diagnosis not present

## 2019-08-12 DIAGNOSIS — S61011A Laceration without foreign body of right thumb without damage to nail, initial encounter: Secondary | ICD-10-CM | POA: Diagnosis not present

## 2019-08-12 MED ORDER — CEPHALEXIN 500 MG PO CAPS: 500.0000 mg | ORAL_CAPSULE | Freq: Three times a day (TID) | ORAL | 0 refills | Status: DC

## 2019-08-12 MED ORDER — POVIDONE-IODINE 10 % EX SOLN
CUTANEOUS | Status: AC
  Filled 2019-08-12: qty 45

## 2019-08-12 MED ORDER — TRAMADOL HCL 50 MG PO TABS
100.0000 mg | ORAL_TABLET | Freq: Once | ORAL | Status: AC
  Administered 2019-08-12: 100 mg via ORAL
  Filled 2019-08-12: qty 2

## 2019-08-12 MED ORDER — BUPIVACAINE HCL (PF) 0.5 % IJ SOLN
20.0000 mL | Freq: Once | INTRAMUSCULAR | Status: AC
  Administered 2019-08-12: 20 mL
  Filled 2019-08-12: qty 30

## 2019-08-12 MED ORDER — CEPHALEXIN 500 MG PO CAPS
500.0000 mg | ORAL_CAPSULE | Freq: Once | ORAL | Status: AC
  Administered 2019-08-12: 500 mg via ORAL
  Filled 2019-08-12: qty 1

## 2019-08-12 MED ORDER — ACETAMINOPHEN 325 MG PO TABS
650.0000 mg | ORAL_TABLET | Freq: Once | ORAL | Status: AC
  Administered 2019-08-12: 650 mg via ORAL
  Filled 2019-08-12: qty 2

## 2019-08-12 NOTE — ED Notes (Signed)
Attempted to contact [name], daughter, for pt transport. No response at this time.

## 2019-08-12 NOTE — Discharge Instructions (Addendum)
Keep the laceration clean and dry. Do clean it daily with soap and water gently. Do use triple antibiotic ointment on the wound. You can take acetaminophen 650 mg every 6 hrs as needed for pain. Take the antibiotics until gone. Call Dr Ruthe Mannan office to get an appointment to recheck your laceration within the next week to make sure you haven't injured a muscle or tendon.  Return to the ED if the wound gets infected (increased redness, swelling, pain, drains pus, get a fever or see a red streak going up your hand). Call your primary care doctor's office to see when your last tetanus booster was given, you will need another one if it has been more than 10 years.
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
## 2019-08-25 DIAGNOSIS — I639 Cerebral infarction, unspecified: Secondary | ICD-10-CM | POA: Diagnosis not present

## 2019-08-25 DIAGNOSIS — G8194 Hemiplegia, unspecified affecting left nondominant side: Secondary | ICD-10-CM | POA: Diagnosis not present

## 2019-08-27 ENCOUNTER — Telehealth: Payer: Self-pay | Admitting: Orthopedic Surgery

## 2019-08-27 NOTE — Telephone Encounter (Signed)
We had a message left on our voicemail for this patient.  I called him back and spoke with him.  He said it was his aide that called [place].  He said that he has already gotten an appointment this week to see his PCP to get the stitches out.  He did say he appreciated that we called him back.

## 2019-08-29 ENCOUNTER — Telehealth: Payer: Self-pay | Admitting: Orthopedic Surgery

## 2019-08-29 DIAGNOSIS — Z4802 Encounter for removal of sutures: Secondary | ICD-10-CM | POA: Diagnosis not present

## 2019-08-29 NOTE — Telephone Encounter (Signed)
Brian [name], who states she is Brian [name] aid, called and asked about an appointment for this patient to get his stitches out.  I told her that I had already spoken to [name] earlier this week.  She said he told her that he had spoken to [place] but he was half asleep when we spoke and didn't remember what was said.  I told her that he stated he was going to his PCP to get the stitches removed.  She said his appointment there was not until the 22nd.  She said for any appointments out of town, he has to use [facility] transportation.  She said she could take him somewhere local.  I asked her about taking him to Urgent Care, per [facility] suggestion, and she agreed that she could take him to the Urgent Care there in [place].  She will do so today

## 2019-09-10 DIAGNOSIS — Z7409 Other reduced mobility: Secondary | ICD-10-CM | POA: Diagnosis not present

## 2019-09-10 DIAGNOSIS — I6930 Unspecified sequelae of cerebral infarction: Secondary | ICD-10-CM | POA: Diagnosis not present

## 2019-09-10 DIAGNOSIS — M5416 Radiculopathy, lumbar region: Secondary | ICD-10-CM | POA: Diagnosis not present

## 2019-09-10 DIAGNOSIS — I639 Cerebral infarction, unspecified: Secondary | ICD-10-CM | POA: Diagnosis not present

## 2019-09-10 DIAGNOSIS — G5621 Lesion of ulnar nerve, right upper limb: Secondary | ICD-10-CM | POA: Diagnosis not present

## 2019-09-10 DIAGNOSIS — I10 Essential (primary) hypertension: Secondary | ICD-10-CM | POA: Diagnosis not present

## 2019-09-10 DIAGNOSIS — G8194 Hemiplegia, unspecified affecting left nondominant side: Secondary | ICD-10-CM | POA: Diagnosis not present

## 2019-09-13 ENCOUNTER — Ambulatory Visit
Admission: RE | Admit: 2019-09-13 | Discharge: 2019-09-13 | Disposition: A | Discharge status: Home / Self Care | Payer: Medicare HMO | Source: Ambulatory Visit | Attending: Physician Assistant | Admitting: Physician Assistant

## 2019-09-13 DIAGNOSIS — S065XAA Traumatic subdural hemorrhage with loss of consciousness status unknown, initial encounter: Secondary | ICD-10-CM

## 2019-09-13 DIAGNOSIS — S065X9A Traumatic subdural hemorrhage with loss of consciousness of unspecified duration, initial encounter: Secondary | ICD-10-CM

## 2019-09-13 DIAGNOSIS — I6200 Nontraumatic subdural hemorrhage, unspecified: Secondary | ICD-10-CM | POA: Diagnosis not present

## 2019-09-25 DIAGNOSIS — G8194 Hemiplegia, unspecified affecting left nondominant side: Secondary | ICD-10-CM | POA: Diagnosis not present

## 2019-09-25 DIAGNOSIS — I639 Cerebral infarction, unspecified: Secondary | ICD-10-CM | POA: Diagnosis not present

## 2019-10-07 DIAGNOSIS — E162 Hypoglycemia, unspecified: Secondary | ICD-10-CM | POA: Diagnosis not present

## 2019-10-07 DIAGNOSIS — E161 Other hypoglycemia: Secondary | ICD-10-CM | POA: Diagnosis not present

## 2019-10-07 DIAGNOSIS — R4020 Unspecified coma: Secondary | ICD-10-CM | POA: Diagnosis not present

## 2019-10-07 DIAGNOSIS — R110 Nausea: Secondary | ICD-10-CM | POA: Diagnosis not present

## 2019-10-09 DIAGNOSIS — G479 Sleep disorder, unspecified: Secondary | ICD-10-CM | POA: Diagnosis not present

## 2019-10-09 DIAGNOSIS — M79606 Pain in leg, unspecified: Secondary | ICD-10-CM | POA: Diagnosis not present

## 2019-10-09 DIAGNOSIS — G5620 Lesion of ulnar nerve, unspecified upper limb: Secondary | ICD-10-CM | POA: Diagnosis not present

## 2019-10-09 DIAGNOSIS — Z79899 Other long term (current) drug therapy: Secondary | ICD-10-CM | POA: Diagnosis not present

## 2019-10-09 DIAGNOSIS — M545 Low back pain: Secondary | ICD-10-CM | POA: Diagnosis not present

## 2019-10-10 ENCOUNTER — Emergency Department (HOSPITAL_COMMUNITY)
Admission: EM | Admit: 2019-10-10 | Discharge: 2019-10-10 | Disposition: A | Discharge status: Home / Self Care | Payer: Medicare HMO | Attending: Emergency Medicine | Admitting: Emergency Medicine

## 2019-10-10 ENCOUNTER — Emergency Department (HOSPITAL_COMMUNITY): Payer: Medicare HMO

## 2019-10-10 ENCOUNTER — Encounter (HOSPITAL_COMMUNITY): Payer: Self-pay | Admitting: Emergency Medicine

## 2019-10-10 ENCOUNTER — Other Ambulatory Visit: Payer: Self-pay

## 2019-10-10 DIAGNOSIS — S20212A Contusion of left front wall of thorax, initial encounter: Secondary | ICD-10-CM | POA: Diagnosis not present

## 2019-10-10 DIAGNOSIS — G44319 Acute post-traumatic headache, not intractable: Secondary | ICD-10-CM | POA: Insufficient documentation

## 2019-10-10 DIAGNOSIS — Y9389 Activity, other specified: Secondary | ICD-10-CM | POA: Diagnosis not present

## 2019-10-10 DIAGNOSIS — S0990XA Unspecified injury of head, initial encounter: Secondary | ICD-10-CM | POA: Diagnosis not present

## 2019-10-10 DIAGNOSIS — Y9289 Other specified places as the place of occurrence of the external cause: Secondary | ICD-10-CM | POA: Insufficient documentation

## 2019-10-10 DIAGNOSIS — Z79899 Other long term (current) drug therapy: Secondary | ICD-10-CM | POA: Insufficient documentation

## 2019-10-10 DIAGNOSIS — S199XXA Unspecified injury of neck, initial encounter: Secondary | ICD-10-CM | POA: Diagnosis not present

## 2019-10-10 DIAGNOSIS — R519 Headache, unspecified: Secondary | ICD-10-CM | POA: Diagnosis not present

## 2019-10-10 DIAGNOSIS — R0781 Pleurodynia: Secondary | ICD-10-CM | POA: Diagnosis not present

## 2019-10-10 DIAGNOSIS — R69 Illness, unspecified: Secondary | ICD-10-CM | POA: Diagnosis not present

## 2019-10-10 DIAGNOSIS — R52 Pain, unspecified: Secondary | ICD-10-CM | POA: Diagnosis not present

## 2019-10-10 DIAGNOSIS — F17210 Nicotine dependence, cigarettes, uncomplicated: Secondary | ICD-10-CM | POA: Insufficient documentation

## 2019-10-10 DIAGNOSIS — I6389 Other cerebral infarction: Secondary | ICD-10-CM | POA: Diagnosis not present

## 2019-10-10 DIAGNOSIS — I10 Essential (primary) hypertension: Secondary | ICD-10-CM | POA: Insufficient documentation

## 2019-10-10 DIAGNOSIS — Z7901 Long term (current) use of anticoagulants: Secondary | ICD-10-CM | POA: Diagnosis not present

## 2019-10-10 DIAGNOSIS — Y998 Other external cause status: Secondary | ICD-10-CM | POA: Insufficient documentation

## 2019-10-10 DIAGNOSIS — I671 Cerebral aneurysm, nonruptured: Secondary | ICD-10-CM | POA: Diagnosis not present

## 2019-10-10 DIAGNOSIS — M4802 Spinal stenosis, cervical region: Secondary | ICD-10-CM | POA: Diagnosis not present
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
## 2019-10-26 DIAGNOSIS — I639 Cerebral infarction, unspecified: Secondary | ICD-10-CM | POA: Diagnosis not present

## 2019-10-26 DIAGNOSIS — G8194 Hemiplegia, unspecified affecting left nondominant side: Secondary | ICD-10-CM | POA: Diagnosis not present

## 2019-11-25 DIAGNOSIS — G8194 Hemiplegia, unspecified affecting left nondominant side: Secondary | ICD-10-CM | POA: Diagnosis not present

## 2019-11-25 DIAGNOSIS — I639 Cerebral infarction, unspecified: Secondary | ICD-10-CM | POA: Diagnosis not present

## 2019-12-26 DIAGNOSIS — G8194 Hemiplegia, unspecified affecting left nondominant side: Secondary | ICD-10-CM | POA: Diagnosis not present

## 2019-12-26 DIAGNOSIS — I639 Cerebral infarction, unspecified: Secondary | ICD-10-CM | POA: Diagnosis not present

## 2020-01-25 DIAGNOSIS — G8191 Hemiplegia, unspecified affecting right dominant side: Secondary | ICD-10-CM | POA: Diagnosis not present

## 2020-01-25 DIAGNOSIS — I639 Cerebral infarction, unspecified: Secondary | ICD-10-CM | POA: Diagnosis not present

## 2020-02-25 DIAGNOSIS — I639 Cerebral infarction, unspecified: Secondary | ICD-10-CM | POA: Diagnosis not present

## 2020-02-25 DIAGNOSIS — G8194 Hemiplegia, unspecified affecting left nondominant side: Secondary | ICD-10-CM | POA: Diagnosis not present

## 2020-03-27 DIAGNOSIS — G8194 Hemiplegia, unspecified affecting left nondominant side: Secondary | ICD-10-CM | POA: Diagnosis not present

## 2020-03-27 DIAGNOSIS — I639 Cerebral infarction, unspecified: Secondary | ICD-10-CM | POA: Diagnosis not present

## 2020-03-27 DIAGNOSIS — G8191 Hemiplegia, unspecified affecting right dominant side: Secondary | ICD-10-CM | POA: Diagnosis not present

## 2020-04-21 DIAGNOSIS — G8194 Hemiplegia, unspecified affecting left nondominant side: Secondary | ICD-10-CM | POA: Diagnosis not present

## 2020-04-21 DIAGNOSIS — I639 Cerebral infarction, unspecified: Secondary | ICD-10-CM | POA: Diagnosis not present

## 2020-04-24 DIAGNOSIS — I639 Cerebral infarction, unspecified: Secondary | ICD-10-CM | POA: Diagnosis not present

## 2020-04-24 DIAGNOSIS — G8194 Hemiplegia, unspecified affecting left nondominant side: Secondary | ICD-10-CM | POA: Diagnosis not present

## 2020-04-29 DIAGNOSIS — R55 Syncope and collapse: Secondary | ICD-10-CM | POA: Diagnosis not present

## 2020-04-29 DIAGNOSIS — I639 Cerebral infarction, unspecified: Secondary | ICD-10-CM | POA: Diagnosis not present

## 2020-04-29 DIAGNOSIS — N390 Urinary tract infection, site not specified: Secondary | ICD-10-CM | POA: Diagnosis not present

## 2020-04-29 DIAGNOSIS — Z8673 Personal history of transient ischemic attack (TIA), and cerebral infarction without residual deficits: Secondary | ICD-10-CM | POA: Diagnosis not present

## 2020-04-29 DIAGNOSIS — R4020 Unspecified coma: Secondary | ICD-10-CM | POA: Diagnosis not present

## 2020-04-29 DIAGNOSIS — J0100 Acute maxillary sinusitis, unspecified: Secondary | ICD-10-CM | POA: Diagnosis not present

## 2020-04-29 DIAGNOSIS — N400 Enlarged prostate without lower urinary tract symptoms: Secondary | ICD-10-CM | POA: Diagnosis not present

## 2020-04-29 DIAGNOSIS — N3000 Acute cystitis without hematuria: Secondary | ICD-10-CM | POA: Diagnosis not present

## 2020-04-29 DIAGNOSIS — I499 Cardiac arrhythmia, unspecified: Secondary | ICD-10-CM | POA: Diagnosis not present

## 2020-04-29 DIAGNOSIS — I671 Cerebral aneurysm, nonruptured: Secondary | ICD-10-CM | POA: Diagnosis not present

## 2020-04-29 DIAGNOSIS — I10 Essential (primary) hypertension: Secondary | ICD-10-CM | POA: Diagnosis not present

## 2020-04-29 DIAGNOSIS — R6889 Other general symptoms and signs: Secondary | ICD-10-CM | POA: Diagnosis not present

## 2020-04-29 DIAGNOSIS — Z792 Long term (current) use of antibiotics: Secondary | ICD-10-CM | POA: Diagnosis not present

## 2020-04-29 DIAGNOSIS — E872 Acidosis: Secondary | ICD-10-CM | POA: Diagnosis not present

## 2020-04-29 DIAGNOSIS — Z7409 Other reduced mobility: Secondary | ICD-10-CM | POA: Diagnosis not present

## 2020-04-29 DIAGNOSIS — R404 Transient alteration of awareness: Secondary | ICD-10-CM | POA: Diagnosis not present

## 2020-04-29 DIAGNOSIS — N1832 Chronic kidney disease, stage 3b: Secondary | ICD-10-CM | POA: Diagnosis not present

## 2020-04-29 DIAGNOSIS — I129 Hypertensive chronic kidney disease with stage 1 through stage 4 chronic kidney disease, or unspecified chronic kidney disease: Secondary | ICD-10-CM | POA: Diagnosis not present

## 2020-04-29 DIAGNOSIS — R0902 Hypoxemia: Secondary | ICD-10-CM | POA: Diagnosis not present

## 2020-04-29 DIAGNOSIS — D649 Anemia, unspecified: Secondary | ICD-10-CM | POA: Diagnosis not present

## 2020-04-29 DIAGNOSIS — Z79899 Other long term (current) drug therapy: Secondary | ICD-10-CM | POA: Diagnosis not present

## 2020-04-29 DIAGNOSIS — A419 Sepsis, unspecified organism: Secondary | ICD-10-CM | POA: Diagnosis not present

## 2020-04-29 DIAGNOSIS — R531 Weakness: Secondary | ICD-10-CM | POA: Diagnosis not present

## 2020-04-29 DIAGNOSIS — Z743 Need for continuous supervision: Secondary | ICD-10-CM | POA: Diagnosis not present

## 2020-04-29 DIAGNOSIS — Z20822 Contact with and (suspected) exposure to covid-19: Secondary | ICD-10-CM | POA: Diagnosis not present

## 2020-04-29 DIAGNOSIS — R4182 Altered mental status, unspecified: Secondary | ICD-10-CM | POA: Diagnosis not present

## 2020-04-29 DIAGNOSIS — I77810 Thoracic aortic ectasia: Secondary | ICD-10-CM | POA: Diagnosis not present

## 2020-04-30 DIAGNOSIS — I5189 Other ill-defined heart diseases: Secondary | ICD-10-CM | POA: Diagnosis not present

## 2020-04-30 DIAGNOSIS — Z79899 Other long term (current) drug therapy: Secondary | ICD-10-CM | POA: Diagnosis not present

## 2020-04-30 DIAGNOSIS — N3000 Acute cystitis without hematuria: Secondary | ICD-10-CM | POA: Diagnosis not present

## 2020-04-30 DIAGNOSIS — D649 Anemia, unspecified: Secondary | ICD-10-CM | POA: Diagnosis not present

## 2020-04-30 DIAGNOSIS — I10 Essential (primary) hypertension: Secondary | ICD-10-CM | POA: Diagnosis not present

## 2020-04-30 DIAGNOSIS — I517 Cardiomegaly: Secondary | ICD-10-CM | POA: Diagnosis not present

## 2020-04-30 DIAGNOSIS — R4182 Altered mental status, unspecified: Secondary | ICD-10-CM | POA: Diagnosis not present

## 2020-04-30 DIAGNOSIS — Z792 Long term (current) use of antibiotics: Secondary | ICD-10-CM | POA: Diagnosis not present

## 2020-04-30 DIAGNOSIS — Z8673 Personal history of transient ischemic attack (TIA), and cerebral infarction without residual deficits: Secondary | ICD-10-CM | POA: Diagnosis not present

## 2020-04-30 DIAGNOSIS — N400 Enlarged prostate without lower urinary tract symptoms: Secondary | ICD-10-CM | POA: Diagnosis not present

## 2020-05-08 IMAGING — CT CT HEAD W/O CM
3 series · 15 of 47 positions shown, 18 images · non-contrast
Comparison: CT head 06/28/2018

CLINICAL DATA: Decreased mental status several days. Aneurysm
clipping on the right February 28, 2019

EXAM:
CT HEAD WITHOUT CONTRAST
TECHNIQUE: Contiguous axial images were obtained from the base of the skull
through the vertex without intravenous contrast.

[Series 4: coronal soft · coronal · 0.34mm/px · 3 of 79 slices shown]
[im 27/79  brain]
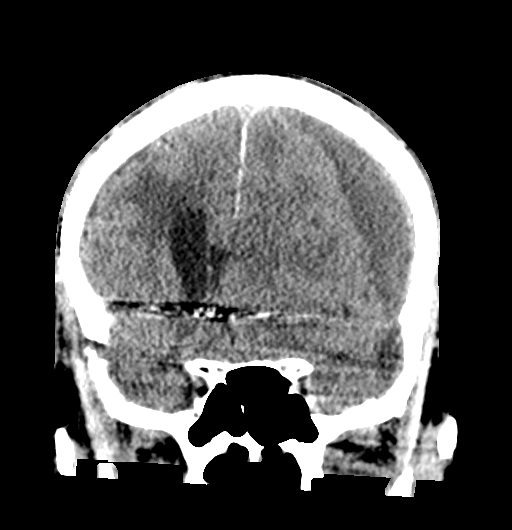
[im 35/79  brain]
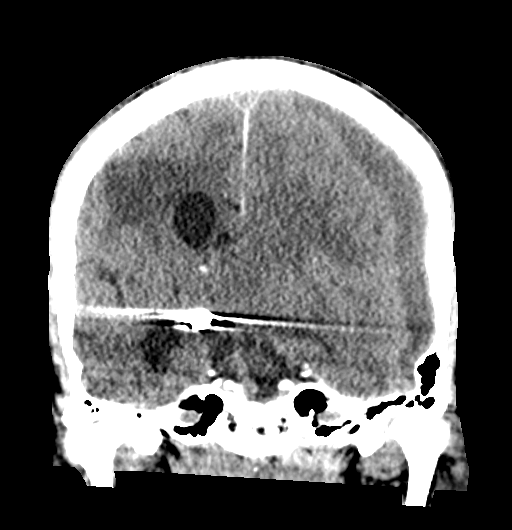
[im 44/79  brain]
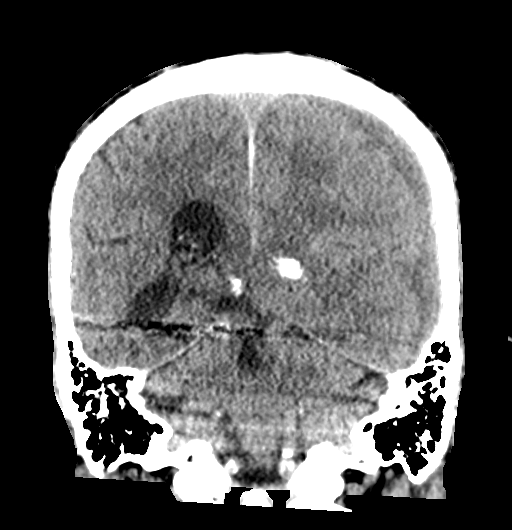

[Series 5: sagittal soft · sagittal · 0.36mm/px · 3 of 59 slices shown]
[im 20/59  brain]
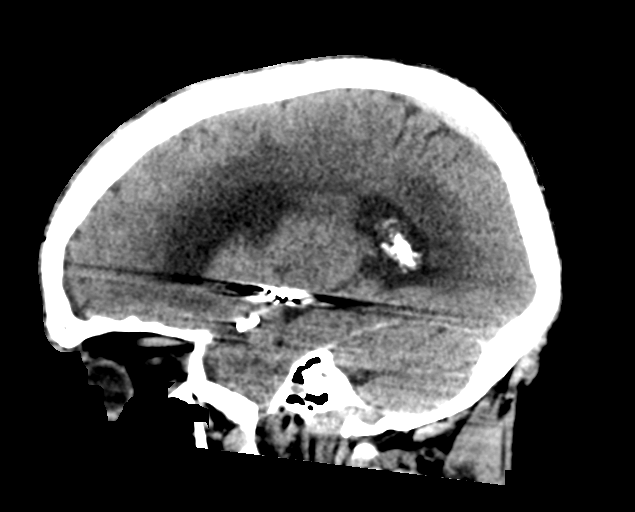
[im 30/59  brain]
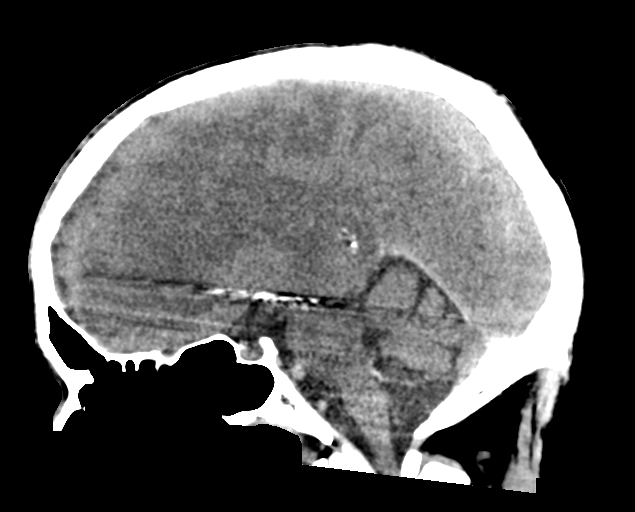
[im 39/59  brain]
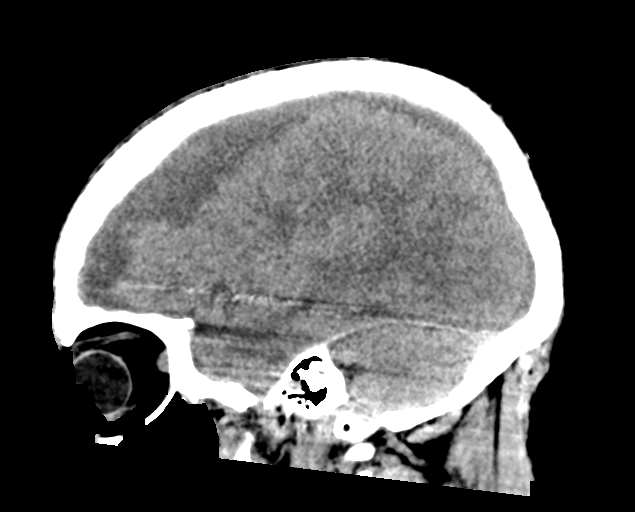

[Series 6: head w o · axial · 0.41mm/px · z∈[+1640,+1775]mm · 9 of 33 slices shown, 12 images]
[im 3/33  brain]
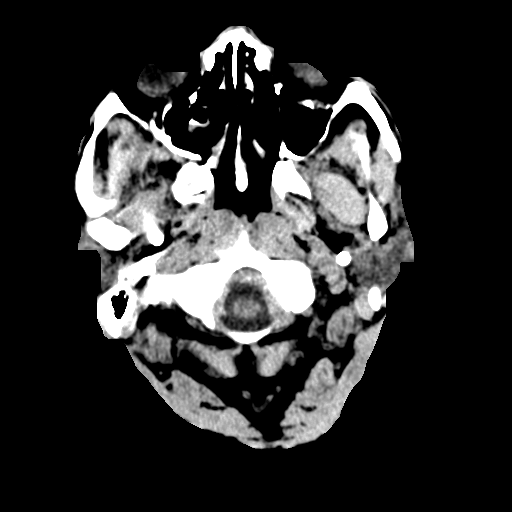
[im 3/33  bone]
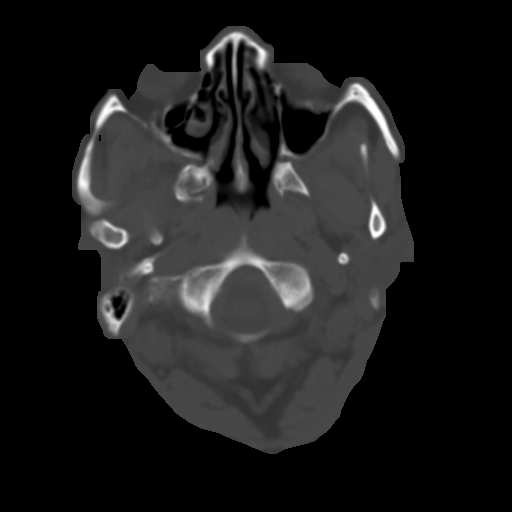
[im 6/33  brain]
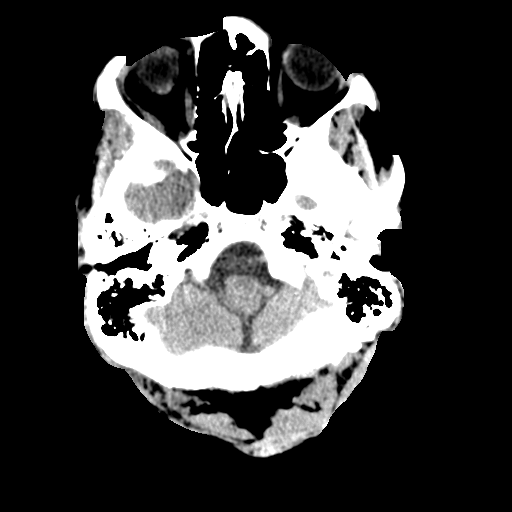
[im 9/33  brain]
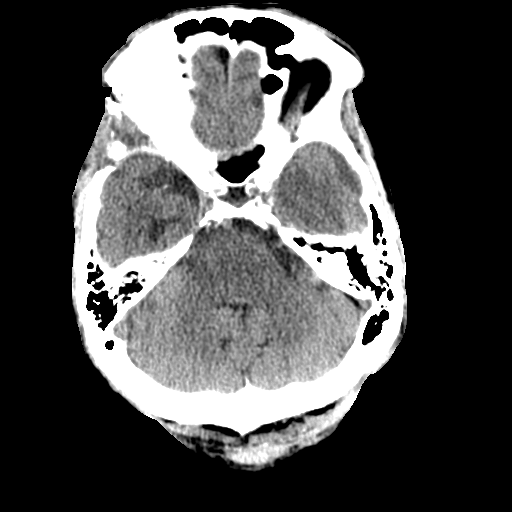
[im 13/33  brain]
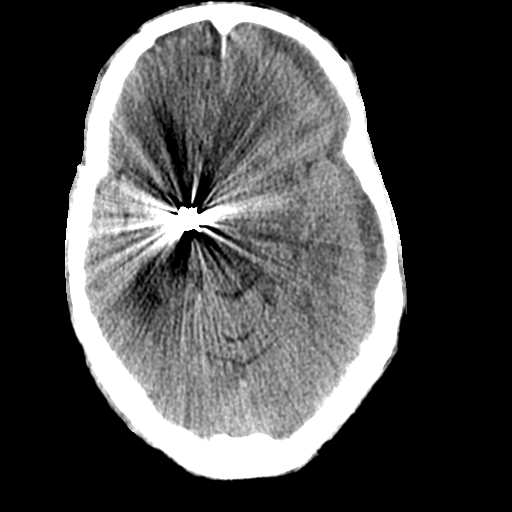
[im 17/33  brain]
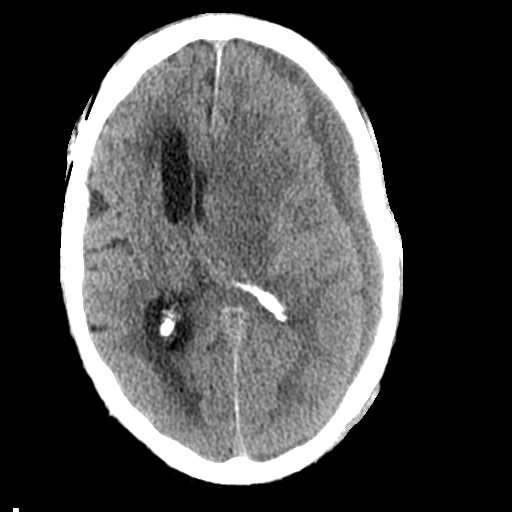
[im 17/33  bone]
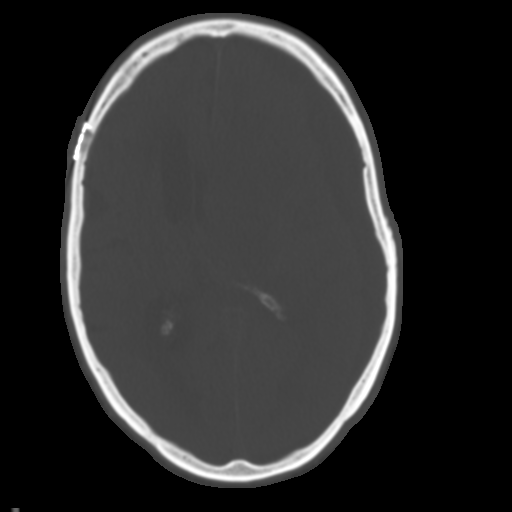
[im 20/33  brain]
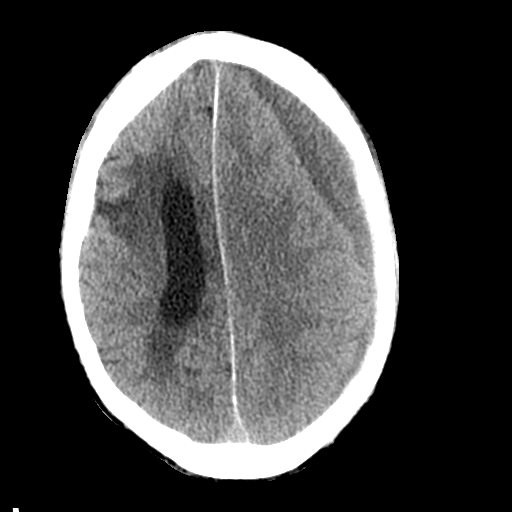
[im 24/33  brain]
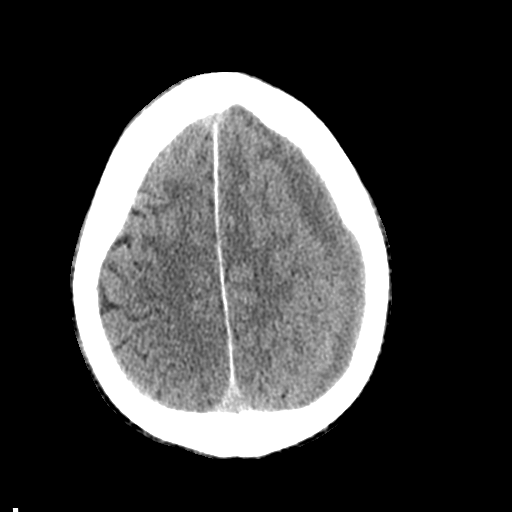
[im 27/33  brain]
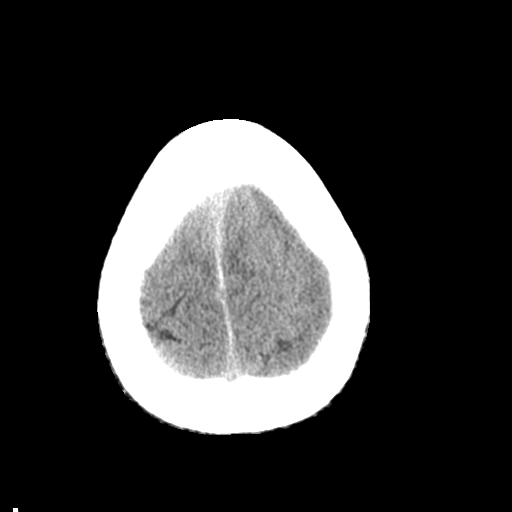
[im 30/33  brain]
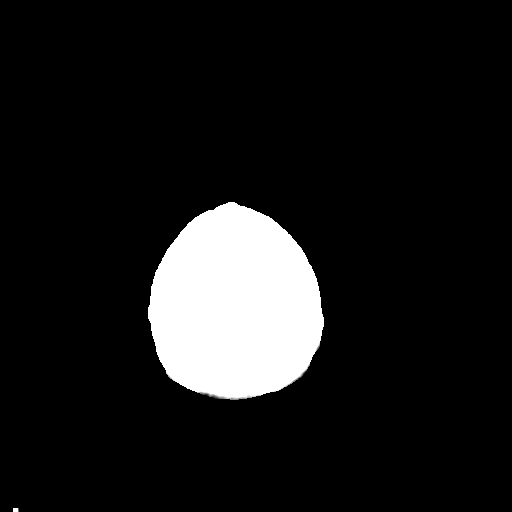
[im 30/33  bone]
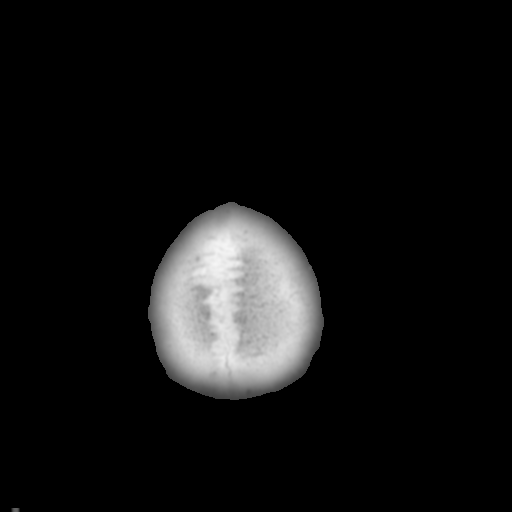

[15 of 47 positions shown; findings below may reference images not displayed]

FINDINGS: Brain: Large low-density subdural fluid collection on the left. This
is slightly lower dense than brain. No high-density acute
hemorrhage. This involves the frontal and parietal and temporal
lobe. The fluid collections largest in left frontal lobe measuring
17 mm in thickness. There is extensive mass-effect with compression
of the left lateral ventricle. 15 mm midline shift to the right.
Mild trapping and enlargement of the right lateral ventricle.

Right frontal craniotomy. Aneurysm coiling and clipping of right
terminal internal carotid artery region. Prior CT demonstrated the
coiling but not the clip. There is chronic infarct in the right
frontal lobe unchanged. No acute infarct.

Vascular: Negative for hyperdense vessel

Skull: Right frontal craniotomy.  No acute skeletal abnormality.

Sinuses/Orbits: Negative

Other: None
IMPRESSION: Large intermediate to low-density subdural hematoma on the left
measuring up to 17 mm in thickness in the left frontal lobe.
Mass-effect and midline shift of 15 mm mild trapping and enlargement
of the right lateral ventricle

Prior coiling and clipping of right internal carotid artery
aneurysm.

These results were called by telephone at the time of interpretation
on 03/27/2019 at [DATE] to provider Dr. Dadskie, who verbally
acknowledged these results.

## 2020-05-09 ENCOUNTER — Emergency Department (HOSPITAL_COMMUNITY): Payer: Medicare HMO

## 2020-05-09 ENCOUNTER — Other Ambulatory Visit (HOSPITAL_COMMUNITY): Payer: Medicare HMO

## 2020-05-09 ENCOUNTER — Inpatient Hospital Stay (HOSPITAL_COMMUNITY): Payer: Medicare HMO

## 2020-05-09 ENCOUNTER — Other Ambulatory Visit: Payer: Self-pay

## 2020-05-09 ENCOUNTER — Inpatient Hospital Stay (HOSPITAL_COMMUNITY)
Admission: EM | Admit: 2020-05-09 | Discharge: 2020-06-01 | DRG: 871 | Disposition: A | Discharge status: Home Health | Payer: Medicare HMO | Attending: Internal Medicine | Admitting: Internal Medicine

## 2020-05-09 ENCOUNTER — Encounter (HOSPITAL_COMMUNITY): Payer: Self-pay

## 2020-05-09 DIAGNOSIS — I69354 Hemiplegia and hemiparesis following cerebral infarction affecting left non-dominant side: Secondary | ICD-10-CM | POA: Diagnosis not present

## 2020-05-09 DIAGNOSIS — I959 Hypotension, unspecified: Secondary | ICD-10-CM | POA: Diagnosis present

## 2020-05-09 DIAGNOSIS — L0390 Cellulitis, unspecified: Secondary | ICD-10-CM | POA: Diagnosis present

## 2020-05-09 DIAGNOSIS — I2694 Multiple subsegmental pulmonary emboli without acute cor pulmonale: Secondary | ICD-10-CM | POA: Diagnosis not present

## 2020-05-09 DIAGNOSIS — L8990 Pressure ulcer of unspecified site, unspecified stage: Secondary | ICD-10-CM | POA: Diagnosis present

## 2020-05-09 DIAGNOSIS — I248 Other forms of acute ischemic heart disease: Secondary | ICD-10-CM | POA: Diagnosis present

## 2020-05-09 DIAGNOSIS — I728 Aneurysm of other specified arteries: Secondary | ICD-10-CM | POA: Diagnosis not present

## 2020-05-09 DIAGNOSIS — R9082 White matter disease, unspecified: Secondary | ICD-10-CM | POA: Diagnosis not present

## 2020-05-09 DIAGNOSIS — R404 Transient alteration of awareness: Secondary | ICD-10-CM | POA: Diagnosis not present

## 2020-05-09 DIAGNOSIS — I2699 Other pulmonary embolism without acute cor pulmonale: Secondary | ICD-10-CM

## 2020-05-09 DIAGNOSIS — Z86718 Personal history of other venous thrombosis and embolism: Secondary | ICD-10-CM

## 2020-05-09 DIAGNOSIS — R6520 Severe sepsis without septic shock: Secondary | ICD-10-CM | POA: Diagnosis present

## 2020-05-09 DIAGNOSIS — I2609 Other pulmonary embolism with acute cor pulmonale: Secondary | ICD-10-CM | POA: Diagnosis not present

## 2020-05-09 DIAGNOSIS — I5032 Chronic diastolic (congestive) heart failure: Secondary | ICD-10-CM | POA: Diagnosis not present

## 2020-05-09 DIAGNOSIS — M5136 Other intervertebral disc degeneration, lumbar region: Secondary | ICD-10-CM | POA: Diagnosis present

## 2020-05-09 DIAGNOSIS — Z20822 Contact with and (suspected) exposure to covid-19: Secondary | ICD-10-CM | POA: Diagnosis present

## 2020-05-09 DIAGNOSIS — E860 Dehydration: Secondary | ICD-10-CM | POA: Diagnosis present

## 2020-05-09 DIAGNOSIS — L03314 Cellulitis of groin: Secondary | ICD-10-CM | POA: Diagnosis not present

## 2020-05-09 DIAGNOSIS — M549 Dorsalgia, unspecified: Secondary | ICD-10-CM | POA: Diagnosis present

## 2020-05-09 DIAGNOSIS — Z743 Need for continuous supervision: Secondary | ICD-10-CM | POA: Diagnosis not present

## 2020-05-09 DIAGNOSIS — T68XXXA Hypothermia, initial encounter: Secondary | ICD-10-CM | POA: Diagnosis not present

## 2020-05-09 DIAGNOSIS — Z9189 Other specified personal risk factors, not elsewhere classified: Secondary | ICD-10-CM | POA: Diagnosis not present

## 2020-05-09 DIAGNOSIS — N179 Acute kidney failure, unspecified: Secondary | ICD-10-CM | POA: Diagnosis not present

## 2020-05-09 DIAGNOSIS — J9601 Acute respiratory failure with hypoxia: Secondary | ICD-10-CM | POA: Diagnosis present

## 2020-05-09 DIAGNOSIS — G894 Chronic pain syndrome: Secondary | ICD-10-CM | POA: Diagnosis present

## 2020-05-09 DIAGNOSIS — J939 Pneumothorax, unspecified: Secondary | ICD-10-CM | POA: Diagnosis not present

## 2020-05-09 DIAGNOSIS — R2689 Other abnormalities of gait and mobility: Secondary | ICD-10-CM | POA: Diagnosis not present

## 2020-05-09 DIAGNOSIS — Z79899 Other long term (current) drug therapy: Secondary | ICD-10-CM

## 2020-05-09 DIAGNOSIS — R109 Unspecified abdominal pain: Secondary | ICD-10-CM | POA: Diagnosis not present

## 2020-05-09 DIAGNOSIS — A419 Sepsis, unspecified organism: Principal | ICD-10-CM | POA: Diagnosis present

## 2020-05-09 DIAGNOSIS — G9341 Metabolic encephalopathy: Secondary | ICD-10-CM | POA: Diagnosis present

## 2020-05-09 DIAGNOSIS — R569 Unspecified convulsions: Secondary | ICD-10-CM | POA: Diagnosis not present

## 2020-05-09 DIAGNOSIS — R7989 Other specified abnormal findings of blood chemistry: Secondary | ICD-10-CM | POA: Diagnosis not present

## 2020-05-09 DIAGNOSIS — I82403 Acute embolism and thrombosis of unspecified deep veins of lower extremity, bilateral: Secondary | ICD-10-CM | POA: Diagnosis present

## 2020-05-09 DIAGNOSIS — W19XXXA Unspecified fall, initial encounter: Secondary | ICD-10-CM | POA: Diagnosis present

## 2020-05-09 DIAGNOSIS — N400 Enlarged prostate without lower urinary tract symptoms: Secondary | ICD-10-CM | POA: Diagnosis present

## 2020-05-09 DIAGNOSIS — G9389 Other specified disorders of brain: Secondary | ICD-10-CM | POA: Diagnosis not present

## 2020-05-09 DIAGNOSIS — W1830XA Fall on same level, unspecified, initial encounter: Secondary | ICD-10-CM | POA: Diagnosis not present

## 2020-05-09 DIAGNOSIS — L89151 Pressure ulcer of sacral region, stage 1: Secondary | ICD-10-CM | POA: Diagnosis present

## 2020-05-09 DIAGNOSIS — K219 Gastro-esophageal reflux disease without esophagitis: Secondary | ICD-10-CM | POA: Diagnosis present

## 2020-05-09 DIAGNOSIS — R69 Illness, unspecified: Secondary | ICD-10-CM | POA: Diagnosis not present

## 2020-05-09 DIAGNOSIS — Z043 Encounter for examination and observation following other accident: Secondary | ICD-10-CM | POA: Diagnosis not present

## 2020-05-09 DIAGNOSIS — F1410 Cocaine abuse, uncomplicated: Secondary | ICD-10-CM | POA: Diagnosis not present

## 2020-05-09 DIAGNOSIS — R778 Other specified abnormalities of plasma proteins: Secondary | ICD-10-CM | POA: Diagnosis not present

## 2020-05-09 DIAGNOSIS — R531 Weakness: Secondary | ICD-10-CM | POA: Diagnosis not present

## 2020-05-09 DIAGNOSIS — R296 Repeated falls: Secondary | ICD-10-CM | POA: Diagnosis present

## 2020-05-09 DIAGNOSIS — G40909 Epilepsy, unspecified, not intractable, without status epilepticus: Secondary | ICD-10-CM | POA: Diagnosis present

## 2020-05-09 DIAGNOSIS — I110 Hypertensive heart disease with heart failure: Secondary | ICD-10-CM | POA: Diagnosis present

## 2020-05-09 DIAGNOSIS — J9600 Acute respiratory failure, unspecified whether with hypoxia or hypercapnia: Secondary | ICD-10-CM | POA: Diagnosis not present

## 2020-05-09 DIAGNOSIS — R5381 Other malaise: Secondary | ICD-10-CM | POA: Diagnosis not present

## 2020-05-09 DIAGNOSIS — R0602 Shortness of breath: Secondary | ICD-10-CM | POA: Diagnosis not present

## 2020-05-09 DIAGNOSIS — I671 Cerebral aneurysm, nonruptured: Secondary | ICD-10-CM | POA: Diagnosis not present

## 2020-05-09 DIAGNOSIS — I499 Cardiac arrhythmia, unspecified: Secondary | ICD-10-CM | POA: Diagnosis not present

## 2020-05-09 DIAGNOSIS — K5730 Diverticulosis of large intestine without perforation or abscess without bleeding: Secondary | ICD-10-CM | POA: Diagnosis not present

## 2020-05-09 DIAGNOSIS — R000 Tachycardia, unspecified: Secondary | ICD-10-CM | POA: Diagnosis not present

## 2020-05-09 LAB — POCT I-STAT 7, (LYTES, BLD GAS, ICA,H+H)
Acid-base deficit: 4 mmol/L — ABNORMAL HIGH (ref 0.0–2.0)
Bicarbonate: 21.6 mmol/L (ref 20.0–28.0)
Calcium, Ion: 1.22 mmol/L (ref 1.15–1.40)
HCT: 34 % — ABNORMAL LOW (ref 39.0–52.0)
Hemoglobin: 11.6 g/dL — ABNORMAL LOW (ref 13.0–17.0)
O2 Saturation: 93 %
Patient temperature: 98.2
Potassium: 4.3 mmol/L (ref 3.5–5.1)
Sodium: 143 mmol/L (ref 135–145)
TCO2: 23 mmol/L (ref 22–32)
pCO2 arterial: 41.6 mmHg (ref 32.0–48.0)
pH, Arterial: 7.323 — ABNORMAL LOW (ref 7.350–7.450)
pO2, Arterial: 71 mmHg — ABNORMAL LOW (ref 83.0–108.0)

## 2020-05-09 LAB — CBC WITH DIFFERENTIAL/PLATELET
Abs Immature Granulocytes: 0.49 10*3/uL — ABNORMAL HIGH (ref 0.00–0.07)
Basophils Absolute: 0.1 10*3/uL (ref 0.0–0.1)
Basophils Relative: 1 %
Eosinophils Absolute: 0.1 10*3/uL (ref 0.0–0.5)
Eosinophils Relative: 1 %
HCT: 38.5 % — ABNORMAL LOW (ref 39.0–52.0)
Hemoglobin: 12.1 g/dL — ABNORMAL LOW (ref 13.0–17.0)
Immature Granulocytes: 4 %
Lymphocytes Relative: 20 %
Lymphs Abs: 2.3 10*3/uL (ref 0.7–4.0)
MCH: 28.6 pg (ref 26.0–34.0)
MCHC: 31.4 g/dL (ref 30.0–36.0)
MCV: 91 fL (ref 80.0–100.0)
Monocytes Absolute: 0.8 10*3/uL (ref 0.1–1.0)
Monocytes Relative: 7 %
Neutro Abs: 7.8 10*3/uL — ABNORMAL HIGH (ref 1.7–7.7)
Neutrophils Relative %: 67 %
Platelets: 171 10*3/uL (ref 150–400)
RBC: 4.23 MIL/uL (ref 4.22–5.81)
RDW: 13.8 % (ref 11.5–15.5)
WBC: 11.6 10*3/uL — ABNORMAL HIGH (ref 4.0–10.5)
nRBC: 0 % (ref 0.0–0.2)

## 2020-05-09 LAB — URINALYSIS, ROUTINE W REFLEX MICROSCOPIC
Bacteria, UA: NONE SEEN
Bilirubin Urine: NEGATIVE
Glucose, UA: NEGATIVE mg/dL
Ketones, ur: NEGATIVE mg/dL
Leukocytes,Ua: NEGATIVE
Nitrite: NEGATIVE
Protein, ur: NEGATIVE mg/dL
Specific Gravity, Urine: 1.019 (ref 1.005–1.030)
pH: 5 (ref 5.0–8.0)

## 2020-05-09 LAB — AMMONIA: Ammonia: 18 umol/L (ref 9–35)

## 2020-05-09 LAB — ECHOCARDIOGRAM COMPLETE
AR max vel: 3.54 cm2
AV Area VTI: 3.83 cm2
AV Area mean vel: 3.37 cm2
AV Mean grad: 2 mmHg
AV Peak grad: 3 mmHg
Ao pk vel: 0.86 m/s
Area-P 1/2: 9.6 cm2
Height: 67 in
MV VTI: 1.87 cm2
S' Lateral: 2.2 cm
Weight: 2321 oz

## 2020-05-09 LAB — GLUCOSE, CAPILLARY
Glucose-Capillary: 119 mg/dL — ABNORMAL HIGH (ref 70–99)
Glucose-Capillary: 119 mg/dL — ABNORMAL HIGH (ref 70–99)
Glucose-Capillary: 79 mg/dL (ref 70–99)
Glucose-Capillary: 75 mg/dL (ref 70–99)
Glucose-Capillary: 70 mg/dL (ref 70–99)
Glucose-Capillary: 90 mg/dL (ref 70–99)

## 2020-05-09 LAB — PROCALCITONIN: Procalcitonin: 2.84 ng/mL

## 2020-05-09 LAB — HEMOGLOBIN AND HEMATOCRIT, BLOOD
HCT: 38.2 % — ABNORMAL LOW (ref 39.0–52.0)
Hemoglobin: 11.9 g/dL — ABNORMAL LOW (ref 13.0–17.0)

## 2020-05-09 LAB — PROTIME-INR
INR: 1.5 — ABNORMAL HIGH (ref 0.8–1.2)
Prothrombin Time: 17.2 seconds — ABNORMAL HIGH (ref 11.4–15.2)

## 2020-05-09 LAB — COMPREHENSIVE METABOLIC PANEL
ALT: 31 U/L (ref 0–44)
AST: 60 U/L — ABNORMAL HIGH (ref 15–41)
Albumin: 2.5 g/dL — ABNORMAL LOW (ref 3.5–5.0)
Alkaline Phosphatase: 58 U/L (ref 38–126)
Anion gap: 15 (ref 5–15)
BUN: 30 mg/dL — ABNORMAL HIGH (ref 8–23)
CO2: 18 mmol/L — ABNORMAL LOW (ref 22–32)
Calcium: 8.3 mg/dL — ABNORMAL LOW (ref 8.9–10.3)
Chloride: 103 mmol/L (ref 98–111)
Creatinine, Ser: 2 mg/dL — ABNORMAL HIGH (ref 0.61–1.24)
GFR, Estimated: 35 mL/min — ABNORMAL LOW (ref >60)
Glucose, Bld: 134 mg/dL — ABNORMAL HIGH (ref 70–99)
Potassium: 5.2 mmol/L — ABNORMAL HIGH (ref 3.5–5.1)
Sodium: 136 mmol/L (ref 135–145)
Total Bilirubin: 2 mg/dL — ABNORMAL HIGH (ref 0.3–1.2)
Total Protein: 7.3 g/dL (ref 6.5–8.1)

## 2020-05-09 LAB — I-STAT CHEM 8, ED
BUN: 44 mg/dL — ABNORMAL HIGH (ref 8–23)
Calcium, Ion: 1.1 mmol/L — ABNORMAL LOW (ref 1.15–1.40)
Chloride: 107 mmol/L (ref 98–111)
Creatinine, Ser: 1.9 mg/dL — ABNORMAL HIGH (ref 0.61–1.24)
Glucose, Bld: 134 mg/dL — ABNORMAL HIGH (ref 70–99)
HCT: 37 % — ABNORMAL LOW (ref 39.0–52.0)
Hemoglobin: 12.6 g/dL — ABNORMAL LOW (ref 13.0–17.0)
Potassium: 4.9 mmol/L (ref 3.5–5.1)
Sodium: 139 mmol/L (ref 135–145)
TCO2: 24 mmol/L (ref 22–32)

## 2020-05-09 LAB — LACTIC ACID, PLASMA
Lactic Acid, Venous: 6.5 mmol/L (ref 0.5–1.9)
Lactic Acid, Venous: 2.7 mmol/L (ref 0.5–1.9)

## 2020-05-09 LAB — HEPARIN LEVEL (UNFRACTIONATED): Heparin Unfractionated: 0.41 IU/mL (ref 0.30–0.70)

## 2020-05-09 LAB — RAPID URINE DRUG SCREEN, HOSP PERFORMED
Amphetamines: NOT DETECTED
Barbiturates: NOT DETECTED
Benzodiazepines: NOT DETECTED
Cocaine: POSITIVE — AB
Opiates: NOT DETECTED
Tetrahydrocannabinol: NOT DETECTED

## 2020-05-09 LAB — CBG MONITORING, ED: Glucose-Capillary: 106 mg/dL — ABNORMAL HIGH (ref 70–99)

## 2020-05-09 LAB — TROPONIN I (HIGH SENSITIVITY)
Troponin I (High Sensitivity): 236 ng/L (ref <18)
Troponin I (High Sensitivity): 661 ng/L (ref <18)
Troponin I (High Sensitivity): 1239 ng/L (ref <18)

## 2020-05-09 LAB — BRAIN NATRIURETIC PEPTIDE: B Natriuretic Peptide: 72.8 pg/mL (ref 0.0–100.0)

## 2020-05-09 LAB — LIPASE, BLOOD: Lipase: 23 U/L (ref 11–51)

## 2020-05-09 LAB — MAGNESIUM: Magnesium: 1.9 mg/dL (ref 1.7–2.4)

## 2020-05-09 LAB — SARS CORONAVIRUS 2 (TAT 6-24 HRS): SARS Coronavirus 2: NEGATIVE

## 2020-05-09 LAB — MRSA PCR SCREENING: MRSA by PCR: NEGATIVE

## 2020-05-09 LAB — TSH: TSH: 1.142 u[IU]/mL (ref 0.350–4.500)

## 2020-05-09 MED ORDER — SODIUM CHLORIDE 0.9 % IV SOLN
1000.0000 mL | INTRAVENOUS | Status: DC
  Administered 2020-05-09 – 2020-05-10 (×5): 1000 mL via INTRAVENOUS

## 2020-05-09 MED ORDER — DEXTROSE 10 % IV SOLN: INTRAVENOUS | Status: DC

## 2020-05-09 MED ORDER — DEXTROSE 50 % IV SOLN
INTRAVENOUS | Status: AC
  Filled 2020-05-09: qty 50

## 2020-05-09 MED ORDER — DOCUSATE SODIUM 100 MG PO CAPS
100.0000 mg | ORAL_CAPSULE | Freq: Two times a day (BID) | ORAL | Status: DC | PRN
  Administered 2020-05-14: 100 mg via ORAL
  Filled 2020-05-09: qty 1

## 2020-05-09 MED ORDER — SODIUM CHLORIDE 0.9 % IV BOLUS (SEPSIS)
1000.0000 mL | Freq: Once | INTRAVENOUS | Status: AC
  Administered 2020-05-09: 1000 mL via INTRAVENOUS

## 2020-05-09 MED ORDER — HEPARIN (PORCINE) 25000 UT/250ML-% IV SOLN
1250.0000 [IU]/h | INTRAVENOUS | Status: AC
  Administered 2020-05-09 – 2020-05-10 (×2): 1100 [IU]/h via INTRAVENOUS
  Filled 2020-05-09 (×2): qty 250

## 2020-05-09 MED ORDER — SODIUM CHLORIDE 0.9 % IV SOLN
2.0000 g | Freq: Two times a day (BID) | INTRAVENOUS | Status: DC
  Administered 2020-05-09 (×2): 2 g via INTRAVENOUS
  Filled 2020-05-09 (×2): qty 2

## 2020-05-09 MED ORDER — VANCOMYCIN HCL 1500 MG/300ML IV SOLN
1500.0000 mg | Freq: Once | INTRAVENOUS | Status: AC
  Administered 2020-05-09: 1500 mg via INTRAVENOUS
  Filled 2020-05-09: qty 300

## 2020-05-09 MED ORDER — IOHEXOL 350 MG/ML SOLN
80.0000 mL | Freq: Once | INTRAVENOUS | Status: AC | PRN
  Administered 2020-05-09: 80 mL via INTRAVENOUS

## 2020-05-09 MED ORDER — LEVETIRACETAM IN NACL 500 MG/100ML IV SOLN
500.0000 mg | Freq: Two times a day (BID) | INTRAVENOUS | Status: DC
  Administered 2020-05-09 – 2020-05-10 (×4): 500 mg via INTRAVENOUS
  Filled 2020-05-09 (×5): qty 100

## 2020-05-09 MED ORDER — PANTOPRAZOLE SODIUM 40 MG IV SOLR
40.0000 mg | INTRAVENOUS | Status: DC
  Administered 2020-05-09 – 2020-05-10 (×2): 40 mg via INTRAVENOUS
  Filled 2020-05-09 (×2): qty 40

## 2020-05-09 MED ORDER — SODIUM CHLORIDE 0.9 % IV BOLUS
1000.0000 mL | Freq: Once | INTRAVENOUS | Status: AC
  Administered 2020-05-09: 1000 mL via INTRAVENOUS

## 2020-05-09 MED ORDER — POLYETHYLENE GLYCOL 3350 17 G PO PACK
17.0000 g | PACK | Freq: Every day | ORAL | Status: DC | PRN
  Administered 2020-05-14: 17 g via ORAL

## 2020-05-09 MED ORDER — ORAL CARE MOUTH RINSE
15.0000 mL | Freq: Two times a day (BID) | OROMUCOSAL | Status: DC
  Administered 2020-05-10 – 2020-06-01 (×30): 15 mL via OROMUCOSAL

## 2020-05-09 MED ORDER — HEPARIN BOLUS VIA INFUSION
4000.0000 [IU] | Freq: Once | INTRAVENOUS | Status: AC
  Administered 2020-05-09: 4000 [IU] via INTRAVENOUS
  Filled 2020-05-09: qty 4000

## 2020-05-09 MED ORDER — CHLORHEXIDINE GLUCONATE CLOTH 2 % EX PADS
6.0000 | MEDICATED_PAD | Freq: Every day | CUTANEOUS | Status: DC
  Administered 2020-05-09 – 2020-06-01 (×15): 6 via TOPICAL

## 2020-05-09 MED ORDER — VANCOMYCIN HCL 750 MG/150ML IV SOLN
750.0000 mg | INTRAVENOUS | Status: DC
  Administered 2020-05-10: 750 mg via INTRAVENOUS
  Filled 2020-05-09: qty 150

## 2020-05-09 NOTE — Progress Notes (Signed)
  Echocardiogram 2D Echocardiogram has been performed.  Brian [name] 05/09/2020, 10:03 AM

## 2020-05-09 NOTE — Progress Notes (Signed)
Pharmacy Antibiotic Note  Brian [name] is a 75 y.o. male admitted on 05/09/2020 with sepsis, cellulitis and wound contaminated with fecal matter.  Pharmacy has been consulted for Cefepime and Vancomycin dosing. Cultures sent.  WBC elevated at 11.6. Lactic acid elevated at 6.5 >> improving with fluid resuscitation to 2.4. SCr elevated 1.90 with estimated CrCl ~ 32.7 mL/min. Patient is afebrile.   Plan: Cefepime 2g IV every 12 hours.  Vancomycin 1500mg  IV x1 then 750mg  IV ever 24 hours.   Height: 5\' 7"  (170.2 cm) Weight: 65.8 kg (145 lb 1 oz) IBW/kg (Calculated) : 66.1  Temp (24hrs), Avg:97.9 F (36.6 C), Min:97.9 F (36.6 C), Max:97.9 F (36.6 C)  Recent Labs  Lab 05/09/20 0447 05/09/20 0448 05/09/20 0506 05/09/20 0654  WBC  --  11.6*  --   --   CREATININE  --  2.00* 1.90*  --   LATICACIDVEN 6.5*  --   --  2.7*    Estimated Creatinine Clearance: 32.7 mL/min (A) (by C-G formula based on SCr of 1.9 mg/dL (H)).    No Known Allergies  Antimicrobials this admission: Cefepime 3/19 >> Vancomycin 3/19 >>  Dose adjustments this admission:   Microbiology results: 3/19 BCx:  3/19 COVID >>  Thank you for allowing pharmacy to be a part of this patient's care.  Sloan Leiter, PharmD, BCPS, BCCCP Clinical Pharmacist Please refer to [facility] for [place] numbers 05/09/2020 8:26 AM

## 2020-05-09 NOTE — Progress Notes (Signed)
EEG complete - results pending 

## 2020-05-09 NOTE — Progress Notes (Signed)
[facility] notified of failed bedside swallow screen and glucose. Patient falls asleep mid-conversation. Arousable but falls back asleep quickly. Current glucose 75.  Patient kept NPO.

## 2020-05-09 NOTE — ED Triage Notes (Signed)
Patient arrives from home with [facility], EMS was called out earlier in the night for a fall, they helped him back into his chair, EMS called again for additional fall with some hypotension (71/48). 700cc NS given enroute, patient, while enroute patient became unresponsive with agonal respirations, BVM assisted ventilation and then patient regain consciousness and was placed on a NRB.

## 2020-05-09 NOTE — Progress Notes (Signed)
ANTICOAGULATION CONSULT NOTE - Initial Consult  Pharmacy Consult for heparin Indication: pulmonary embolus  No Known Allergies  Patient Measurements: Height: 5\' 7"  (170.2 cm) Weight: 65.8 kg (145 lb 1 oz) IBW/kg (Calculated) : 66.1  Vital Signs: Temp: 97.9 F (36.6 C) (03/19 0443) Temp Source: Temporal (03/19 0443) BP: 133/94 (03/19 0615) Pulse Rate: 108 (03/19 0615)  Labs: Recent Labs    05/09/20 0448 05/09/20 0506  HGB 12.1* 12.6*  HCT 38.5* 37.0*  PLT 171  --   CREATININE  --  1.90*    Estimated Creatinine Clearance: 32.7 mL/min (A) (by C-G formula based on SCr of 1.9 mg/dL (H)).   Medical History: Past Medical History:  Diagnosis Date  . Altered mental status   . BPH (benign prostatic hyperplasia)   . Chronic pain   . Colitis 12/16/2010   Vasculitis  . CVA (cerebral infarction)   . CVA (cerebral vascular accident) ([place])   . DDD (degenerative disc disease), lumbar   . DVT (deep venous thrombosis) ([place])   . Hypertension   . Ileus ([place])   . Left foot drop    4 prong cane, walker  . Limited mobility    needs assistance  . Lower extremity weakness   . Renal disorder   . Sepsis ([place])   . Sepsis(995.91)   . Stroke [facility])    left side weakness  . Vascular insufficiency     Assessment: 75yo male presents to ED via EMS after two falls and hypotension, CT reveals PE, to begin heparin.  Goal of Therapy:  Heparin level 0.3-0.7 units/ml Monitor platelets by anticoagulation protocol: Yes   Plan:  Will give heparin 4000 units IV bolus x1 followed by gtt at 1100 units/hr and monitor heparin levels and CBC.  Wynona Neat, PharmD, BCPS  05/09/2020,6:50 AM

## 2020-05-09 NOTE — Procedures (Signed)
Echo attempted in ED. Patient about to be transferred to 22M. Will attempt again once patient has been transferred.

## 2020-05-09 NOTE — Procedures (Signed)
Brian [name] is a 75 y.o. male with a history of seizure who is undergoing an EEG to evaluate for seizures.  Report: This EEG was acquired with electrodes placed according to the International 10-20 electrode system (including Fp1, Fp2, F3, F4, C3, C4, P3, P4, O1, O2, T3, T4, T5, T6, A1, A2, Fz, Cz, Pz). The following electrodes were missing or displaced: none.  The occipital dominant rhythm on the L was 8.5 Hz. The best background on the R was 5-6 hz over the frontotemporal regions with loss of AP gradient and v low amplitude focal slowing ~4 Hz more posteriorly. This activity is reactive to stimulation. Drowsiness was manifested by background fragmentation; deeper stages of sleep were not identified. There were no interictal epileptiform discharges. There were no electrographic seizures identified. There was no abnormal response to photic stimulation or hyperventilation.   Impression: This EEG was obtained while awake and drowsy and is abnormal due to R focal slowing.     Clinical Correlation: This EEG reflects focal cerebral dysfunction in the R cerebral hemisphere.

## 2020-05-09 NOTE — ED Provider Notes (Signed)
  Physical Exam  BP (!) 133/94   Pulse (!) 108   Temp 97.9 F (36.6 C) (Temporal)   Resp (!) 22   Ht 1.702 m (5\' 7" )   Wt 65.8 kg   SpO2 100%   BMI 22.72 kg/m   Physical Exam  ED Course/Procedures     Procedures  MDM  75 yo male ho sdh presents today with hypotension after fall.  Here he was found to have PE. Required respiratory support prehospital. Patient used cocaine, tramadol, and flexeril prior to ED. Some right rv dilation seen on bedside US. Bilateral PEs noted on cta. Critical care to see and evaluate     Pattricia Boss, MD 05/12/20 [phone]

## 2020-05-09 NOTE — Progress Notes (Signed)
VASCULAR LAB    Bilateral lower extremity venous duplex has been performed.  See CV proc for preliminary results.  Gave verbal results to [place], RN    Nakeda Lebron, RVT 05/09/2020, 2:36 PM
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
## 2020-05-09 NOTE — Progress Notes (Signed)
eLink Physician-Brief Progress Note Patient Name: Brian [name] DOB: 16-Jul-1945 MRN: [id number]   Date of Service  05/09/2020  HPI/Events of Note  Hypoglycemia - Blood glucose = 79, 75.   eICU Interventions  Plan: 1. D10W IV infusion to run at 30 mL/hour.      Intervention Category Major Interventions: Other:  Lysle Dingwall 05/09/2020, 8:21 PM
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
## 2020-05-10 DIAGNOSIS — I2699 Other pulmonary embolism without acute cor pulmonale: Secondary | ICD-10-CM | POA: Diagnosis not present

## 2020-05-10 LAB — GLUCOSE, CAPILLARY
Glucose-Capillary: 74 mg/dL (ref 70–99)
Glucose-Capillary: 82 mg/dL (ref 70–99)
Glucose-Capillary: 83 mg/dL (ref 70–99)
Glucose-Capillary: 84 mg/dL (ref 70–99)
Glucose-Capillary: 89 mg/dL (ref 70–99)
Glucose-Capillary: 99 mg/dL (ref 70–99)

## 2020-05-10 LAB — COMPREHENSIVE METABOLIC PANEL
ALT: 24 U/L (ref 0–44)
AST: 30 U/L (ref 15–41)
Albumin: 2.3 g/dL — ABNORMAL LOW (ref 3.5–5.0)
Alkaline Phosphatase: 52 U/L (ref 38–126)
Anion gap: 7 (ref 5–15)
BUN: 16 mg/dL (ref 8–23)
CO2: 20 mmol/L — ABNORMAL LOW (ref 22–32)
Calcium: 8.1 mg/dL — ABNORMAL LOW (ref 8.9–10.3)
Chloride: 111 mmol/L (ref 98–111)
Creatinine, Ser: 1 mg/dL (ref 0.61–1.24)
GFR, Estimated: >60 mL/min (ref >60)
Glucose, Bld: 85 mg/dL (ref 70–99)
Potassium: 3.9 mmol/L (ref 3.5–5.1)
Sodium: 138 mmol/L (ref 135–145)
Total Bilirubin: 0.6 mg/dL (ref 0.3–1.2)
Total Protein: 6.8 g/dL (ref 6.5–8.1)

## 2020-05-10 LAB — CBC
HCT: 33.5 % — ABNORMAL LOW (ref 39.0–52.0)
Hemoglobin: 10.9 g/dL — ABNORMAL LOW (ref 13.0–17.0)
MCH: 28.7 pg (ref 26.0–34.0)
MCHC: 32.5 g/dL (ref 30.0–36.0)
MCV: 88.2 fL (ref 80.0–100.0)
Platelets: 144 10*3/uL — ABNORMAL LOW (ref 150–400)
RBC: 3.8 MIL/uL — ABNORMAL LOW (ref 4.22–5.81)
RDW: 13.5 % (ref 11.5–15.5)
WBC: 8.4 10*3/uL (ref 4.0–10.5)
nRBC: 0 % (ref 0.0–0.2)

## 2020-05-10 LAB — PROCALCITONIN: Procalcitonin: 4.47 ng/mL

## 2020-05-10 LAB — HEPARIN LEVEL (UNFRACTIONATED): Heparin Unfractionated: 0.29 IU/mL — ABNORMAL LOW (ref 0.30–0.70)

## 2020-05-10 MED ORDER — CEFAZOLIN SODIUM-DEXTROSE 2-4 GM/100ML-% IV SOLN
2.0000 g | Freq: Three times a day (TID) | INTRAVENOUS | Status: DC
  Administered 2020-05-10 – 2020-05-11 (×3): 2 g via INTRAVENOUS
  Filled 2020-05-10 (×5): qty 100

## 2020-05-10 MED ORDER — APIXABAN 5 MG PO TABS
10.0000 mg | ORAL_TABLET | Freq: Two times a day (BID) | ORAL | Status: AC
  Administered 2020-05-10 – 2020-05-16 (×14): 10 mg via ORAL
  Filled 2020-05-10 (×10): qty 2
  Filled 2020-05-10: qty 4
  Filled 2020-05-10 (×4): qty 2

## 2020-05-10 MED ORDER — APIXABAN 5 MG PO TABS
5.0000 mg | ORAL_TABLET | Freq: Two times a day (BID) | ORAL | Status: DC
  Administered 2020-05-17 – 2020-06-01 (×31): 5 mg via ORAL
  Filled 2020-05-10 (×7): qty 1
  Filled 2020-05-10: qty 2
  Filled 2020-05-10 (×23): qty 1
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
## 2020-05-10 NOTE — Care Management (Signed)
Benefit check submitted for Apixaban 10mg  po BID x7 days then 5mg  po BID. Will result Monday
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
## 2020-05-10 NOTE — Progress Notes (Signed)
Pharmacy Antibiotic Note  Brian [name] is a 75 y.o. male admitted on 05/09/2020 with cellulitis.  Pharmacy has been consulted to change Vancomycin and Cefepime to Cefazolin dosing.  WBC trending down.  PCT up to 4.47. No signs/sxs of pneumonia noted.  MRSA PCR negative.  Cellulitis appears to be improving.  Hemodynamics are stable.   Plan: D/C Cefepime and Vancomycin. Start Cefazolin 2g IV every 8 hours.  Monitor clinical status and culture results.   Height: 5\' 7"  (170.2 cm) Weight: 65.8 kg (145 lb 1 oz) IBW/kg (Calculated) : 66.1  Temp (24hrs), Avg:97.5 F (36.4 C), Min:96.5 F (35.8 C), Max:98.1 F (36.7 C)  Recent Labs  Lab 05/09/20 0447 05/09/20 0448 05/09/20 0506 05/09/20 0654 05/10/20 0234  WBC  --  11.6*  --   --  8.4  CREATININE  --  2.00* 1.90*  --  1.00  LATICACIDVEN 6.5*  --   --  2.7*  --     Estimated Creatinine Clearance: 62.1 mL/min (by C-G formula based on SCr of 1 mg/dL).    No Known Allergies  Antimicrobials this admission: Vancomycin 3/19 >> Cefepime 3/19 >>  Microbiology results: 3/19 BCx >> 3/19 COVID negative 3/19 MRSA PCR negative  Thank you for allowing pharmacy to be a part of this patient's care.  Sloan Leiter, PharmD, BCPS, BCCCP Clinical Pharmacist Please refer to [facility] for [place] numbers 05/10/2020 9:05 AM

## 2020-05-10 NOTE — Progress Notes (Signed)
ANTICOAGULATION CONSULT NOTE - Initial Consult  Pharmacy Consult for Apixaban Indication: pulmonary embolus and DVT  No Known Allergies  Patient Measurements: Height: 5\' 7"  (170.2 cm) Weight: 65.8 kg (145 lb 1 oz) IBW/kg (Calculated) : 66.1  Vital Signs: Temp: 98.1 F (36.7 C) (03/20 0421) Temp Source: Oral (03/20 0421) BP: 141/128 (03/20 0800) Pulse Rate: 84 (03/20 0800)  Labs: Recent Labs    05/09/20 0448 05/09/20 0506 05/09/20 0643 05/09/20 0920 05/09/20 1108 05/09/20 1503 05/10/20 0234  HGB 12.1* 12.6*  --  11.9* 11.6*  --  10.9*  HCT 38.5* 37.0*  --  38.2* 34.0*  --  33.5*  PLT 171  --   --   --   --   --  144*  LABPROT  --   --   --  17.2*  --   --   --   INR  --   --   --  1.5*  --   --   --   HEPARINUNFRC  --   --   --   --   --  0.41 0.29*  CREATININE 2.00* 1.90*  --   --   --   --  1.00  TROPONINIHS 236*  --  661* 1,239*  --   --   --     Estimated Creatinine Clearance: 62.1 mL/min (by C-G formula based on SCr of 1 mg/dL).   Medical History: Past Medical History:  Diagnosis Date  . Altered mental status   . BPH (benign prostatic hyperplasia)   . Chronic pain   . Colitis 12/16/2010   Vasculitis  . CVA (cerebral infarction)   . CVA (cerebral vascular accident) ([place])   . DDD (degenerative disc disease), lumbar   . DVT (deep venous thrombosis) ([place])   . Hypertension   . Ileus ([place])   . Left foot drop    4 prong cane, walker  . Limited mobility    needs assistance  . Lower extremity weakness   . Renal disorder   . Sepsis ([place])   . Sepsis(995.91)   . Stroke [facility])    left side weakness  . Vascular insufficiency    Assessment: 75 years of age male with history of hypercoagulable syndrome documented back in 2013 (unsure of exact diagnosis) and multiple strokes who presented on 3/19 with acute PE and bilateral DVT's on IV Heparin therapy. Pharmacy consulted to change therapy to Apixaban. Note patient has remote history of SAH as well as SDH in past  (last over 1 year ago). CT scan on 3/19 was negative for any bleeding.   Discussed case with Dr. Ruthann Cancer - benefit of loading dose for 7 days outweighs risk of bleed with high clot burden at this time and no bleeding noted on CT. Hgb 11.6 >>10.9. Platelets 144. HL was just slightly sub-therapeutic this AM and dose of IV Heparin was increased. Will now switch to Apixaban therapy.   Goal of Therapy:  Monitor platelets by anticoagulation protocol: Yes   Plan:  Start Apixaban 10mg  po BID x7 days then on Sunday 3/27 decrease to 5mg  po BID.  Stop IV Heparin at the same time the first dose of Apixaban is given.  Monitor CBC and for signs and/or symptoms of bleeding.  TOC consult placed to help with medication needs.   Sloan Leiter, PharmD, BCPS, BCCCP Clinical Pharmacist Please refer to [facility] for [place] numbers 05/10/2020,9:09 AM

## 2020-05-11 DIAGNOSIS — I2694 Multiple subsegmental pulmonary emboli without acute cor pulmonale: Secondary | ICD-10-CM

## 2020-05-11 DIAGNOSIS — L8990 Pressure ulcer of unspecified site, unspecified stage: Secondary | ICD-10-CM | POA: Diagnosis present

## 2020-05-11 LAB — CBC
HCT: 34.9 % — ABNORMAL LOW (ref 39.0–52.0)
Hemoglobin: 11.4 g/dL — ABNORMAL LOW (ref 13.0–17.0)
MCH: 28.1 pg (ref 26.0–34.0)
MCHC: 32.7 g/dL (ref 30.0–36.0)
MCV: 86.2 fL (ref 80.0–100.0)
Platelets: 159 10*3/uL (ref 150–400)
RBC: 4.05 MIL/uL — ABNORMAL LOW (ref 4.22–5.81)
RDW: 13 % (ref 11.5–15.5)
WBC: 7.6 10*3/uL (ref 4.0–10.5)
nRBC: 0 % (ref 0.0–0.2)

## 2020-05-11 LAB — GLUCOSE, CAPILLARY
Glucose-Capillary: 85 mg/dL (ref 70–99)
Glucose-Capillary: 82 mg/dL (ref 70–99)
Glucose-Capillary: 87 mg/dL (ref 70–99)
Glucose-Capillary: 92 mg/dL (ref 70–99)
Glucose-Capillary: 95 mg/dL (ref 70–99)

## 2020-05-11 LAB — PROCALCITONIN: Procalcitonin: 2.16 ng/mL

## 2020-05-11 LAB — HEPARIN LEVEL (UNFRACTIONATED): Heparin Unfractionated: >2.2 IU/mL — ABNORMAL HIGH (ref 0.30–0.70)

## 2020-05-11 MED ORDER — HYDRALAZINE HCL 20 MG/ML IJ SOLN: 10.0000 mg | INTRAMUSCULAR | Status: DC | PRN

## 2020-05-11 MED ORDER — LEVETIRACETAM 500 MG PO TABS
500.0000 mg | ORAL_TABLET | Freq: Two times a day (BID) | ORAL | Status: DC
  Administered 2020-05-11 – 2020-06-01 (×43): 500 mg via ORAL
  Filled 2020-05-11 (×45): qty 1

## 2020-05-11 MED ORDER — OXYCODONE HCL 5 MG PO TABS: 2.5000 mg | ORAL_TABLET | ORAL | Status: DC | PRN

## 2020-05-11 MED ORDER — OXYCODONE-ACETAMINOPHEN 5-325 MG PO TABS
1.0000 | ORAL_TABLET | ORAL | Status: DC | PRN
  Administered 2020-05-11 – 2020-05-15 (×6): 1 via ORAL
  Filled 2020-05-11 (×7): qty 1

## 2020-05-11 MED ORDER — AMLODIPINE BESYLATE 5 MG PO TABS
5.0000 mg | ORAL_TABLET | Freq: Every day | ORAL | Status: DC
  Administered 2020-05-11 – 2020-06-01 (×21): 5 mg via ORAL
  Filled 2020-05-11 (×22): qty 1

## 2020-05-11 MED ORDER — GABAPENTIN 400 MG PO CAPS
800.0000 mg | ORAL_CAPSULE | Freq: Every day | ORAL | Status: DC
  Administered 2020-05-11 – 2020-05-16 (×6): 800 mg via ORAL
  Filled 2020-05-11 (×6): qty 2

## 2020-05-11 MED ORDER — OXYCODONE-ACETAMINOPHEN 7.5-325 MG PO TABS: 1.0000 | ORAL_TABLET | ORAL | Status: DC | PRN

## 2020-05-11 MED ORDER — CEFDINIR 300 MG PO CAPS
300.0000 mg | ORAL_CAPSULE | Freq: Two times a day (BID) | ORAL | Status: AC
  Administered 2020-05-11 – 2020-05-15 (×10): 300 mg via ORAL
  Filled 2020-05-11 (×11): qty 1

## 2020-05-11 NOTE — TOC Benefit Eligibility Note (Signed)
Transition of Care [facility]) Benefit Eligibility Note    Patient Details  Name: SAFIR MICHALEC MRN: [id number] Date of Birth: 1946/02/19   Medication/Dose: Alveda Reasons  20 MG DAILY  Covered?: Yes  Tier: 3 Drug  Prescription Coverage Preferred [facility]: [facility], [facility]  and  [facility] with Person/Company/Phone Number:: JOHN   @ [employer] # [phone] OPT- MEMBER  Co-Pay: ZERO DOLLARS  Prior Approval: No  Deductible: Unmet  Additional Notes: XARELTO  15 MG BID : P/A-YES OVER Q/L  # [phone] OPT-1    Memory Argue Phone Number: 05/11/2020, 12:35 PM

## 2020-05-11 NOTE — Progress Notes (Signed)
Patient BP elevated- currently 160/102. Patient complains of back pain and states he takes Tramadol at home. Currently no pain medication ordered. [facility] notified. Awaiting orders.

## 2020-05-11 NOTE — Progress Notes (Signed)
PROGRESS NOTE    Brian [name]  IFO:[id number] DOB: [birth year] DOA: 05/09/2020 PCP: [facility]    Brief Narrative:  75 year old gentleman with active cocaine use, smoker, history of a stroke with left-sided hemiparesis, brain aneurysm on anticoagulation and bleeding on 1/21, spontaneous subdural hematoma on 2/21 status post burr hole and evacuation, seizure disorders and on overall poor health presented to the emergency room with multiple falls and somnolence.  Apparently, he lives at home with some caretaker.  He tells me that he was taking pain medications.  He might have taken double dose of tramadol along with gabapentin.  He thinks he should be resting and sleeping after that but he try to walk so he fell.  He also has history of seizure that was provoked with brain bleed and remains on Keppra.  Uses a walker and cane at home.  Mostly sedentary.  EMS was called, he was hypotensive with blood pressure of 71/48.  Resuscitated with fluid.  Found to have AKI, bilateral PE.   Assessment & Plan:   Active Problems:   Pulmonary emboli (HCC)   Pressure injury of skin  Acute lobar and segmental pulmonary embolism:  With evidence of right heart strain on initial CTA scan.  Echocardiogram with reduced right ventricular function, with history of subarachnoid hemorrhage and subdural hematoma, no TPA given.  Patient clinically stabilized and now on room air. Treated with heparin and now on Apixaban. He was on coumadin until last [facility] and stopped since then. Benefits vs side effects discussed with patient and family, no absolute contra indications , so using anticoagulation.  Will discharge patient on Eliquis.  Left flank and groin cellulitis, skin maceration: Procalcitonin elevated on admission.  Blood cultures negative.  Clinically improving.  Local wound care, moisturizer.  Adequately stabilized. 2 days of cefepime.  Changed to oral Omnicef for total of 7  days.  Acute metabolic encephalopathy: Due to above.  Improved.  Cocaine use: Discussed extensively.  Has history of subarachnoid hemorrhage, he may have more problems with ongoing cocaine use.  Patient is receptive and wants to quit.  Encouraged.  History of a stroke with left hemiparesis, subarachnoid hemorrhage and subdural hematoma, history of seizure on Keppra. No new neurological deficit.  He has chronic left-sided weakness. Patient stated that he had a seizure episode 2 months ago, will continue Keppra for the time being.  Chronic pain issues: Careful on pain medications.  Resume home doses of tramadol and gabapentin.  Acute kidney injury: Treated with fluids and removed.  Hypertension: Holding blood pressure medications from home.  Can transfer to medical telemetry bed.  Start working with PT OT for discharge disposition plans.   DVT prophylaxis:  apixaban (ELIQUIS) tablet 10 mg  apixaban (ELIQUIS) tablet 5 mg   Code Status: Full code Family Communication: Patient's daughter on the phone Disposition Plan: Status is: Inpatient  Remains inpatient appropriate because:Unsafe d/c plan and Inpatient level of care appropriate due to severity of illness   Dispo: The patient is from: Home              Anticipated d/c is to: SNF              Patient currently is not medically stable to d/c.   Difficult to place patient No         Consultants:   PCCM  Procedures:   None  Antimicrobials:   None   Subjective: Patient seen and examined.  He is on  room air.  No family at the bedside.  Patient is eager to get up and go home.  He thinks he has enough support at home with his caretaker and will be better at home.  Denies any chest pain or shortness of breath. Is poor historian.  He thinks his brain bleed was 2 months ago, however it was exactly 1 year ago.  Objective: Vitals:   05/11/20 0800 05/11/20 0900 05/11/20 1000 05/11/20 1108  BP: 118/80 118/86 114/79    Pulse: 94 96 96   Resp: 20 (!) 24 (!) 24   Temp:    (!) 97.3 F (36.3 C)  TempSrc:    Oral  SpO2: 98% 95% 97%   Weight:      Height:        Intake/Output Summary (Last 24 hours) at 05/11/2020 1112 Last data filed at 05/11/2020 0900 Gross per 24 hour  Intake 668.85 ml  Output 3650 ml  Net -2981.15 ml   Filed Weights   05/09/20 0442 05/11/20 0309  Weight: 65.8 kg 71.4 kg    Examination:  General exam: Appears calm and comfortable  Chronically sick looking gentleman.  Frail and debilitated.  Not in any distress. Respiratory system: Clear to auscultation. Respiratory effort normal.  No added sound. Cardiovascular system: S1 & S2 heard, RRR.  No edema. Gastrointestinal system: Abdomen is nondistended, soft and nontender. No organomegaly or masses felt. Normal bowel sounds heard. Patient has some maceration which is dry and noninflamed along the left flank and groin.  He had some macerated skin around his scrotum. Central nervous system: Alert and oriented x2-3. He has left upper and lower extremity 4/5.  Data Reviewed: I have personally reviewed following labs and imaging studies  CBC: Recent Labs  Lab 05/09/20 0448 05/09/20 0506 05/09/20 0920 05/09/20 1108 05/10/20 0234 05/11/20 0252  WBC 11.6*  --   --   --  8.4 7.6  NEUTROABS 7.8*  --   --   --   --   --   HGB 12.1* 12.6* 11.9* 11.6* 10.9* 11.4*  HCT 38.5* 37.0* 38.2* 34.0* 33.5* 34.9*  MCV 91.0  --   --   --  88.2 86.2  PLT 171  --   --   --  144* 937   Basic Metabolic Panel: Recent Labs  Lab 05/09/20 0448 05/09/20 0506 05/09/20 0920 05/09/20 1108 05/10/20 0234  NA 136 139  --  143 138  K 5.2* 4.9  --  4.3 3.9  CL 103 107  --   --  111  CO2 18*  --   --   --  20*  GLUCOSE 134* 134*  --   --  85  BUN 30* 44*  --   --  16  CREATININE 2.00* 1.90*  --   --  1.00  CALCIUM 8.3*  --   --   --  8.1*  MG  --   --  1.9  --   --    GFR: Estimated Creatinine Clearance: 62.4 mL/min (by C-G formula based on SCr  of 1 mg/dL). Liver Function Tests: Recent Labs  Lab 05/09/20 0448 05/10/20 0234  AST 60* 30  ALT 31 24  ALKPHOS 58 52  BILITOT 2.0* 0.6  PROT 7.3 6.8  ALBUMIN 2.5* 2.3*   Recent Labs  Lab 05/09/20 0448  LIPASE 23   Recent Labs  Lab 05/09/20 0923  AMMONIA 18   Coagulation Profile: Recent Labs  Lab 05/09/20 0920  INR  1.5*   Cardiac Enzymes: No results for input(s): CKTOTAL, CKMB, CKMBINDEX, TROPONINI in the last 168 hours. BNP (last 3 results) No results for input(s): PROBNP in the last 8760 hours. HbA1C: No results for input(s): HGBA1C in the last 72 hours. CBG: Recent Labs  Lab 05/10/20 1938 05/10/20 2337 05/11/20 0359 05/11/20 0804 05/11/20 1107  GLUCAP 99 89 85 82 87   Lipid Profile: No results for input(s): CHOL, HDL, LDLCALC, TRIG, CHOLHDL, LDLDIRECT in the last 72 hours. Thyroid Function Tests: Recent Labs    05/09/20 0920  TSH 1.142   Anemia Panel: No results for input(s): VITAMINB12, FOLATE, FERRITIN, TIBC, IRON, RETICCTPCT in the last 72 hours. Sepsis Labs: Recent Labs  Lab 05/09/20 0447 05/09/20 0654 05/09/20 0920 05/10/20 0234 05/11/20 0252  PROCALCITON  --   --  2.84 4.47 2.16  LATICACIDVEN 6.5* 2.7*  --   --   --     Recent Results (from the past 240 hour(s))  SARS CORONAVIRUS 2 (TAT 6-24 HRS) Nasopharyngeal Nasopharyngeal Swab     Status: None   Collection Time: 05/09/20  4:55 AM   Specimen: Nasopharyngeal Swab  Result Value Ref Range Status   SARS Coronavirus 2 NEGATIVE NEGATIVE Final    Comment: (NOTE) SARS-CoV-2 target nucleic acids are NOT DETECTED.  The SARS-CoV-2 RNA is generally detectable in upper and lower respiratory specimens during the acute phase of infection. Negative results do not preclude SARS-CoV-2 infection, do not rule out co-infections with other pathogens, and should not be used as the sole basis for treatment or other patient management decisions. Negative results must be combined with clinical  observations, patient history, and epidemiological information. The expected result is Negative.  Fact Sheet for Patients: SugarRoll.be  Fact Sheet for Healthcare Providers: https://www.woods-mathews.com/  This test is not yet approved or cleared by the [place] FDA and  has been authorized for detection and/or diagnosis of SARS-CoV-2 by FDA under an Emergency Use Authorization (EUA). This EUA will remain  in effect (meaning this test can be used) for the duration of the COVID-19 declaration under Se ction 564(b)(1) of the Act, 21 U.S.C. section 360bbb-3(b)(1), unless the authorization is terminated or revoked sooner.  Performed at [facility], [place] [address], [place], [place] [address]   Culture, blood (routine x 2)     Status: None (Preliminary result)   Collection Time: 05/09/20  9:20 AM   Specimen: BLOOD  Result Value Ref Range Status   Specimen Description BLOOD RIGHT ANTECUBITAL  Final   Special Requests   Final    BOTTLES DRAWN AEROBIC ONLY Blood Culture results may not be optimal due to an inadequate volume of blood received in culture bottles   Culture   Final    NO GROWTH 2 DAYS Performed at [facility], [place] [address], [place], [place] [address]    Report Status PENDING  Incomplete  Culture, blood (routine x 2)     Status: None (Preliminary result)   Collection Time: 05/09/20  9:20 AM   Specimen: BLOOD RIGHT HAND  Result Value Ref Range Status   Specimen Description BLOOD RIGHT HAND  Final   Special Requests   Final    BOTTLES DRAWN AEROBIC ONLY Blood Culture results may not be optimal due to an inadequate volume of blood received in culture bottles   Culture   Final    NO GROWTH 2 DAYS Performed at [facility], [place] [address] [place], [place] [address]  Report Status PENDING  Incomplete  MRSA PCR Screening     Status: None   Collection Time: 2020/05/27  6:02 PM   Specimen: Nasopharyngeal   Result Value Ref Range Status   MRSA by PCR NEGATIVE NEGATIVE Final    Comment:        The GeneXpert MRSA Assay (FDA approved for NASAL specimens only), is one component of a comprehensive MRSA colonization surveillance program. It is not intended to diagnose MRSA infection nor to guide or monitor treatment for MRSA infections. Performed at [facility], [place] [address] [place],  [address]          Radiology Studies: EEG adult  Result Date: May 27, 2020 Derek Jack, MD     May 27, 2020  4:49 PM Brian [name] is a 75 y.o. male with a history of seizure who is undergoing an EEG to evaluate for seizures. Report: This EEG was acquired with electrodes placed according to the International 10-20 electrode system (including Fp1, Fp2, F3, F4, C3, C4, P3, P4, O1, O2, T3, T4, T5, T6, A1, A2, Fz, Cz, Pz). The following electrodes were missing or displaced: none. The occipital dominant rhythm on the L was 8.5 Hz. The best background on the R was 5-6 hz over the frontotemporal regions with loss of AP gradient and v low amplitude focal slowing ~4 Hz more posteriorly. This activity is reactive to stimulation. Drowsiness was manifested by background fragmentation; deeper stages of sleep were not identified. There were no interictal epileptiform discharges. There were no electrographic seizures identified. There was no abnormal response to photic stimulation or hyperventilation. Impression: This EEG was obtained while awake and drowsy and is abnormal due to R focal slowing.   Clinical Correlation: This EEG reflects focal cerebral dysfunction in the R cerebral hemisphere.   VAS [place] LOWER EXTREMITY VENOUS (DVT)  Result Date: 05/10/2020  Lower Venous DVT Study Indications: Pulmonary embolism.  Limitations: Altered mental status. Comparison Study: No prior study on file Performing Technologist: Sharion Dove RVS  Examination Guidelines: A complete evaluation includes B-mode imaging, spectral  Doppler, color Doppler, and power Doppler as needed of all accessible portions of each vessel. Bilateral testing is considered an integral part of a complete examination. Limited examinations for reoccurring indications may be performed as noted. The reflux portion of the exam is performed with the patient in reverse Trendelenburg.  +---------+---------------+---------+-----------+----------+-----------------+ RIGHT    CompressibilityPhasicitySpontaneityPropertiesThrombus Aging    +---------+---------------+---------+-----------+----------+-----------------+ CFV      Full           Yes      Yes                                    +---------+---------------+---------+-----------+----------+-----------------+ SFJ      Full                                                           +---------+---------------+---------+-----------+----------+-----------------+ FV Prox  Partial        No       No                   Age Indeterminate +---------+---------------+---------+-----------+----------+-----------------+ FV Mid   Partial  Age Indeterminate +---------+---------------+---------+-----------+----------+-----------------+ FV DistalPartial                                      Age Indeterminate +---------+---------------+---------+-----------+----------+-----------------+ PFV      Full                                                           +---------+---------------+---------+-----------+----------+-----------------+ POP      Partial        Yes      Yes                  Age Indeterminate +---------+---------------+---------+-----------+----------+-----------------+ PTV      Partial                                      Age Indeterminate +---------+---------------+---------+-----------+----------+-----------------+ PERO     Partial                                      Age Indeterminate  +---------+---------------+---------+-----------+----------+-----------------+   +---------+---------------+---------+-----------+----------+-------------------+ LEFT     CompressibilityPhasicitySpontaneityPropertiesThrombus Aging      +---------+---------------+---------+-----------+----------+-------------------+ CFV      Partial        Yes      Yes                  Age Indeterminate   +---------+---------------+---------+-----------+----------+-------------------+ SFJ      Full                                                             +---------+---------------+---------+-----------+----------+-------------------+ FV Prox  Full                                                             +---------+---------------+---------+-----------+----------+-------------------+ FV Mid   Full                                                             +---------+---------------+---------+-----------+----------+-------------------+ FV DistalFull                                                             +---------+---------------+---------+-----------+----------+-------------------+ PFV      Full                                                             +---------+---------------+---------+-----------+----------+-------------------+  POP                                                   Not well visualized +---------+---------------+---------+-----------+----------+-------------------+ PTV      Full                                                             +---------+---------------+---------+-----------+----------+-------------------+ PERO     Full                                                             +---------+---------------+---------+-----------+----------+-------------------+     Summary: RIGHT: - Findings consistent with age indeterminate deep vein thrombosis involving the right femoral vein, right popliteal vein, right posterior tibial  veins, and right peroneal veins.  LEFT: - Findings consistent with age indeterminate deep vein thrombosis involving the left common femoral vein.  *See table(s) above for measurements and observations. Electronically signed by Ruta Hinds MD on 05/10/2020 at 11:20:43 AM.    Final         Scheduled Meds: . amLODipine  5 mg Oral Daily  . apixaban  10 mg Oral BID   Followed by  . [START ON 05/17/2020] apixaban  5 mg Oral BID  . cefdinir  300 mg Oral Q12H  . Chlorhexidine Gluconate Cloth  6 each Topical Daily  . gabapentin  800 mg Oral Q0600  . levETIRAcetam  500 mg Oral BID  . mouth rinse  15 mL Mouth Rinse BID   Continuous Infusions:   LOS: 2 days    Time spent: 35 minutes    Barb Merino, MD [facility] Pager [phone]

## 2020-05-11 NOTE — Evaluation (Signed)
Physical Therapy Evaluation Patient Details Name: Brian [name] MRN: [id number] DOB: [birth year] Today's Date: 05/11/2020   History of Present Illness  75 year old gentleman admitted with multiple falls and somnolence.Pt  states he might have taken double dose of tramadol along with gabapentin. On arrival to ED, pt hypotensive with blood pressure of 71/48.  Resuscitated with fluid.  Found to have AKI, bilateral PE.   PMH:  active cocaine use, smoker, history of a stroke with left-sided hemiparesis, brain aneurysm on anticoagulation and bleeding on 1/21, spontaneous subdural hematoma on 2/21 status post burr hole and evacuation, seizure disorders  Clinical Impression  Pt admitted with above diagnosis. Pt was able to sit EOB and transfer with overall mod assist for mobility. Pt with poor safety awareness and poor postural stability limiting mobility.  Will need Short term SNF to adddress balance and endurance deficits prior to d/c home as he does not have 24 hour care. Will follow acutely.  Pt currently with functional limitations due to the deficits listed below (see PT Problem List). Pt will benefit from skilled PT to increase their independence and safety with mobility to allow discharge to the venue listed below.      Follow Up Recommendations SNF;Supervision/Assistance - 24 hour    Equipment Recommendations  None recommended by PT    Recommendations for Other Services       Precautions / Restrictions Precautions Precautions: Fall Restrictions Weight Bearing Restrictions: No      Mobility  Bed Mobility Overal bed mobility: Needs Assistance Bed Mobility: Supine to Sit     Supine to sit: Min assist;Mod assist     General bed mobility comments: Pt needed min assist to roll with cues as well and mod assist to sit up from sidelying as pt could not coordinate getting elbow under him to push up.    Transfers Overall transfer level: Needs assistance Equipment used: Rolling walker  (2 wheeled) Transfers: Sit to/from [facility] Sit to Stand: Mod assist;+2 physical assistance;From elevated surface Stand pivot transfers: Mod assist;+2 physical assistance       General transfer comment: Pt stood with mod assist to power up.  Pt could not stand completely upright and had difficulty gripping RW with left hand.  Pts condom cath came off and pt was urinating in floor. Had to sit pt back down and clean him and change all linens and socks.  Once cleaned he stood and pivoted but did more of a partial stand pivot with pt not able to move the RW as he should needing incr assist to stand and pivot to the chair.  Pt needs assist for safety.  Ambulation/Gait                Stairs            Wheelchair Mobility    Modified Rankin (Stroke Patients Only)       Balance Overall balance assessment: Needs assistance Sitting-balance support: No upper extremity supported;Feet supported Sitting balance-Leahy Scale: Fair     Standing balance support: Bilateral upper extremity supported;During functional activity Standing balance-Leahy Scale: Poor Standing balance comment: relies on bil UE support as well as external support                             Pertinent Vitals/Pain Pain Assessment: Faces Faces Pain Scale: Hurts little more Pain Location: generalized Pain Descriptors / Indicators: Aching;Grimacing Pain Intervention(s): Limited activity within patient's tolerance;Monitored during  session;Repositioned    Home Living Family/patient expects to be discharged to:: Private residence Living Arrangements: Non-relatives/Friends Available Help at Discharge: Family;Personal care attendant (aide(7 days, 7 hours)- helps with bathroom and meals) Type of Home: House Home Access: Level entry     Home Layout: One level Home Equipment: Walker - 2 wheels;Shower seat;Grab bars - tub/shower;Cane - quad;Wheelchair - power      Prior Function  Level of Independence: Independent with assistive device(s);Needs assistance (uses rolling walker)   Gait / Transfers Assistance Needed: states he used RW vs cane at home with and without assist  ADL's / Homemaking Assistance Needed: aide assist with ADLs, son and daughter assist pt at night        Hand Dominance   Dominant Hand: Right    Extremity/Trunk Assessment   Upper Extremity Assessment Upper Extremity Assessment: Defer to OT evaluation    Lower Extremity Assessment Lower Extremity Assessment: RLE deficits/detail;LLE deficits/detail RLE Deficits / Details: Hip flexion 3/5, knee extensiion 3+/5, ankle PF 2/5, DF 2/5 LLE Deficits / Details: Hip extension 2/5, knee extension 3-/5, ankle PF 1/5, DF 0/5    Cervical / Trunk Assessment Cervical / Trunk Assessment: Kyphotic  Communication   Communication: Other (comment) (mumbles)  Cognition Arousal/Alertness: Awake/alert Behavior During Therapy: WFL for tasks assessed/performed Overall Cognitive Status: Impaired/Different from baseline Area of Impairment: Memory;Following commands;Safety/judgement;Awareness;Problem solving                     Memory: Decreased short-term memory Following Commands: Follows one step commands with increased time;Follows one step commands inconsistently Safety/Judgement: Decreased awareness of safety;Decreased awareness of deficits   Problem Solving: Slow processing;Decreased initiation;Requires verbal cues;Difficulty sequencing        General Comments General comments (skin integrity, edema, etc.): Supine(HR= 95 bpm, SPO2= 94% RA; RR= 22; BP= 117/86 mmHg)    Exercises General Exercises - Lower Extremity Ankle Circles/Pumps: AROM;Both;10 reps;Seated Long Arc Quad: AROM;Both;10 reps;Seated Hip Flexion/Marching: AROM;Both;10 reps;Seated   Assessment/Plan    PT Assessment Patient needs continued PT services  PT Problem List Decreased activity tolerance;Decreased  balance;Decreased mobility;Decreased strength;Decreased range of motion;Decreased knowledge of use of DME;Decreased safety awareness;Decreased knowledge of precautions;Cardiopulmonary status limiting activity       PT Treatment Interventions DME instruction;Gait training;Functional mobility training;Therapeutic activities;Therapeutic exercise;Balance training;Patient/family education    PT Goals (Current goals can be found in the Care Plan section)  Acute Rehab PT Goals Patient Stated Goal: unable to state PT Goal Formulation: With patient Time For Goal Achievement: 05/25/20 Potential to Achieve Goals: Good    Frequency Min 3X/week   Barriers to discharge Decreased caregiver support      Co-evaluation               AM-PAC PT "6 Clicks" Mobility  Outcome Measure Help needed turning from your back to your side while in a flat bed without using bedrails?: A Lot Help needed moving from lying on your back to sitting on the side of a flat bed without using bedrails?: A Lot Help needed moving to and from a bed to a chair (including a wheelchair)?: A Lot Help needed standing up from a chair using your arms (e.g., wheelchair or bedside chair)?: A Lot Help needed to walk in hospital room?: Total Help needed climbing 3-5 steps with a railing? : Total 6 Click Score: 10    End of Session Equipment Utilized During Treatment: Gait belt Activity Tolerance: Patient limited by fatigue (pt c/o dizziness although BP stable, pt states  his meds make him feel weird and weak.) Patient left: in chair;with call bell/phone within reach;with chair alarm set Nurse Communication: Mobility status (needs condom cath placed back on) PT Visit Diagnosis: Unsteadiness on feet (R26.81);Muscle weakness (generalized) (M62.81)    Time: 1015-1101 PT Time Calculation (min) (ACUTE ONLY): 46 min   Charges:   PT Evaluation $PT Eval Moderate Complexity: 1 Mod PT Treatments $Gait Training: 8-22  mins $Therapeutic Exercise: 8-22 mins        Brian [name] M,PT Acute Rehab Services [phone] (pager)  Brian [name] 05/11/2020, 1:00 PM

## 2020-05-11 NOTE — Progress Notes (Signed)
eLink Physician-Brief Progress Note Patient Name: Brian [name] DOB: October 20, 1945 MRN: [id number]   Date of Service  05/11/2020  HPI/Events of Note  Multiple issues: 1. Chronie back pain and 2. Hypertension - BP = 149/116.  eICU Interventions  Plan: 1. Restart home Percocet 7.5 mg / 325 mg PO Q 4 hours PRN pain. 2. Restart home Amlodipine 5 mg PO Q day. 3. Restart home Gabapentin 800 mg PO Q Day 4. Hydralazine 10 mg IV Q 4 hours PRN SBP > 170 or DBP > 100.     Intervention Category Major Interventions: Other:;Hypertension - evaluation and management  Lysle Dingwall 05/11/2020, 5:37 AM

## 2020-05-12 DIAGNOSIS — I2694 Multiple subsegmental pulmonary emboli without acute cor pulmonale: Secondary | ICD-10-CM | POA: Diagnosis not present

## 2020-05-12 LAB — GLUCOSE, CAPILLARY
Glucose-Capillary: 87 mg/dL (ref 70–99)
Glucose-Capillary: 81 mg/dL (ref 70–99)
Glucose-Capillary: 100 mg/dL — ABNORMAL HIGH (ref 70–99)
Glucose-Capillary: 95 mg/dL (ref 70–99)
Glucose-Capillary: 104 mg/dL — ABNORMAL HIGH (ref 70–99)
Glucose-Capillary: 97 mg/dL (ref 70–99)

## 2020-05-12 NOTE — Progress Notes (Signed)
PROGRESS NOTE    Brian [name]  CHY:[id number] DOB: 1945-12-13 DOA: 05/09/2020 PCP: [facility]    Brief Narrative:  75 year old gentleman with active cocaine use, smoker, history of stroke with left-sided hemiparesis, brain aneurysm on anticoagulation and bleeding on 1/21, spontaneous subdural hematoma on 2/21 status post burr hole and evacuation, seizure disorders and on overall poor health presented to the emergency room with multiple falls and somnolence.  Apparently, he lives at home with some caretaker.  He tells me that he was taking pain medications.  He might have taken double dose of tramadol along with gabapentin.  He thinks he should be resting and sleeping after that but he try to walk so he fell.  He also has history of seizure that was provoked with brain bleed and remains on Keppra.  Uses a walker and cane at home.  Mostly sedentary.  EMS was called, he was hypotensive with blood pressure of 71/48.  Resuscitated with fluid.  Found to have AKI, bilateral PE.   Assessment & Plan:   Active Problems:   Pulmonary emboli (HCC)   Pressure injury of skin  Acute lobar and segmental pulmonary embolism:  With evidence of right heart strain on initial CTA scan.  Echocardiogram with reduced right ventricular function, with history of subarachnoid hemorrhage and subdural hematoma, no TPA given.  Patient clinically stabilized and now on room air. Treated with heparin and now on Apixaban. He was on coumadin until last [facility] and stopped since then. Benefits vs side effects discussed with patient and family, no absolute contra indications , so using anticoagulation.  Will discharge patient on Eliquis.  Left flank and groin cellulitis, skin maceration: Procalcitonin elevated on admission.  Blood cultures negative.  Clinically improving.  Local wound care, moisturizer.  Adequately stabilized. 2 days of cefepime.  Changed to oral Omnicef for total of 4/7  days.  Acute metabolic encephalopathy: Due to above.  Improved.  Cocaine use: Discussed extensively.  Has history of subarachnoid hemorrhage, he may have more problems with ongoing cocaine use.  Patient is receptive and wants to quit.  Encouraged.  History of a stroke with left hemiparesis, subarachnoid hemorrhage and subdural hematoma, history of seizure on Keppra. No new neurological deficit.  He has chronic left-sided weakness. Patient stated that he had a seizure episode 2 months ago, will continue Keppra for the time being.  Chronic pain issues: Careful on pain medications.  Resume home doses of tramadol and gabapentin.  Acute kidney injury: Treated with fluids and removed.  Hypertension: Holding blood pressure medications from home.  Can transfer to medical telemetry bed.   Work with PT.  He will need a skilled rehab before returning home.  Updated Education officer, museum.  Patient is medically stable to transfer to skilled level of care when they find a bed.  Patient also wants to explore whether he can go home with his caretaker.   DVT prophylaxis:  apixaban (ELIQUIS) tablet 10 mg  apixaban (ELIQUIS) tablet 5 mg   Code Status: Full code Family Communication: Patient's daughter on the phone 3/21. Disposition Plan: Status is: Inpatient  Remains inpatient appropriate because:Unsafe d/c plan and Inpatient level of care appropriate due to severity of illness   Dispo: The patient is from: Home              Anticipated d/c is to: SNF              Patient currently is medically stable.   Difficult  to place patient No         Consultants:   PCCM  Procedures:   None  Antimicrobials:   None   Subjective: Patient seen and examined.  No overnight events.  On room air.  Denies any complaints.  He is open to going to a skilled nursing rehab but he wants to explore that with his caretaker.  Objective: Vitals:   05/12/20 0735 05/12/20 0800 05/12/20 0815 05/12/20 1000  BP:    (!) 150/100 (!) 137/92  Pulse:      Resp:  20 14 20   Temp: 98.7 F (37.1 C)     TempSrc: Oral     SpO2:   93%   Weight:      Height:        Intake/Output Summary (Last 24 hours) at 05/12/2020 1028 Last data filed at 05/12/2020 1000 Gross per 24 hour  Intake 6445 ml  Output 1000 ml  Net 5445 ml   Filed Weights   05/09/20 0442 05/11/20 0309 05/12/20 0500  Weight: 65.8 kg 71.4 kg 69.1 kg    Examination:  General exam: Appears calm and comfortable  Chronically sick looking gentleman.  Frail and debilitated.  Not in any distress. Respiratory system: Clear to auscultation. Respiratory effort normal.  No added sound. Cardiovascular system: S1 & S2 heard, RRR.  No edema. Gastrointestinal system: Abdomen is nondistended, soft and nontender. No organomegaly or masses felt. Normal bowel sounds heard. Patient has some maceration which is dry and noninflamed along the left flank and groin.  He had some macerated skin around his scrotum. Central nervous system: Alert and oriented x2-3. He has left upper and lower extremity 4/5.  Data Reviewed: I have personally reviewed following labs and imaging studies  CBC: Recent Labs  Lab 05/09/20 0448 05/09/20 0506 05/09/20 0920 05/09/20 1108 05/10/20 0234 05/11/20 0252  WBC 11.6*  --   --   --  8.4 7.6  NEUTROABS 7.8*  --   --   --   --   --   HGB 12.1* 12.6* 11.9* 11.6* 10.9* 11.4*  HCT 38.5* 37.0* 38.2* 34.0* 33.5* 34.9*  MCV 91.0  --   --   --  88.2 86.2  PLT 171  --   --   --  144* 580   Basic Metabolic Panel: Recent Labs  Lab 05/09/20 0448 05/09/20 0506 05/09/20 0920 05/09/20 1108 05/10/20 0234  NA 136 139  --  143 138  K 5.2* 4.9  --  4.3 3.9  CL 103 107  --   --  111  CO2 18*  --   --   --  20*  GLUCOSE 134* 134*  --   --  85  BUN 30* 44*  --   --  16  CREATININE 2.00* 1.90*  --   --  1.00  CALCIUM 8.3*  --   --   --  8.1*  MG  --   --  1.9  --   --    GFR: Estimated Creatinine Clearance: 62.4 mL/min (by C-G  formula based on SCr of 1 mg/dL). Liver Function Tests: Recent Labs  Lab 05/09/20 0448 05/10/20 0234  AST 60* 30  ALT 31 24  ALKPHOS 58 52  BILITOT 2.0* 0.6  PROT 7.3 6.8  ALBUMIN 2.5* 2.3*   Recent Labs  Lab 05/09/20 0448  LIPASE 23   Recent Labs  Lab 05/09/20 0923  AMMONIA 18   Coagulation Profile: Recent Labs  Lab 05/09/20  0920  INR 1.5*   Cardiac Enzymes: No results for input(s): CKTOTAL, CKMB, CKMBINDEX, TROPONINI in the last 168 hours. BNP (last 3 results) No results for input(s): PROBNP in the last 8760 hours. HbA1C: No results for input(s): HGBA1C in the last 72 hours. CBG: Recent Labs  Lab 05/11/20 1107 05/11/20 1920 05/11/20 2316 05/12/20 0309 05/12/20 0731  GLUCAP 87 92 95 87 81   Lipid Profile: No results for input(s): CHOL, HDL, LDLCALC, TRIG, CHOLHDL, LDLDIRECT in the last 72 hours. Thyroid Function Tests: No results for input(s): TSH, T4TOTAL, FREET4, T3FREE, THYROIDAB in the last 72 hours. Anemia Panel: No results for input(s): VITAMINB12, FOLATE, FERRITIN, TIBC, IRON, RETICCTPCT in the last 72 hours. Sepsis Labs: Recent Labs  Lab 05/09/20 0447 05/09/20 0654 05/09/20 0920 05/10/20 0234 05/11/20 0252  PROCALCITON  --   --  2.84 4.47 2.16  LATICACIDVEN 6.5* 2.7*  --   --   --     Recent Results (from the past 240 hour(s))  SARS CORONAVIRUS 2 (TAT 6-24 HRS) Nasopharyngeal Nasopharyngeal Swab     Status: None   Collection Time: 05/09/20  4:55 AM   Specimen: Nasopharyngeal Swab  Result Value Ref Range Status   SARS Coronavirus 2 NEGATIVE NEGATIVE Final    Comment: (NOTE) SARS-CoV-2 target nucleic acids are NOT DETECTED.  The SARS-CoV-2 RNA is generally detectable in upper and lower respiratory specimens during the acute phase of infection. Negative results do not preclude SARS-CoV-2 infection, do not rule out co-infections with other pathogens, and should not be used as the sole basis for treatment or other patient management  decisions. Negative results must be combined with clinical observations, patient history, and epidemiological information. The expected result is Negative.  Fact Sheet for Patients: SugarRoll.be  Fact Sheet for Healthcare Providers: https://www.woods-mathews.com/  This test is not yet approved or cleared by the [place] FDA and  has been authorized for detection and/or diagnosis of SARS-CoV-2 by FDA under an Emergency Use Authorization (EUA). This EUA will remain  in effect (meaning this test can be used) for the duration of the COVID-19 declaration under Se ction 564(b)(1) of the Act, 21 U.S.C. section 360bbb-3(b)(1), unless the authorization is terminated or revoked sooner.  Performed at [facility], [place] [address] [place], [place] [address]   Culture, blood (routine x 2)     Status: None (Preliminary result)   Collection Time: 05/09/20  9:20 AM   Specimen: BLOOD  Result Value Ref Range Status   Specimen Description BLOOD RIGHT ANTECUBITAL  Final   Special Requests   Final    BOTTLES DRAWN AEROBIC ONLY Blood Culture results may not be optimal due to an inadequate volume of blood received in culture bottles   Culture   Final    NO GROWTH 3 DAYS Performed at [facility], [place] [address], [place], [place] [address]    Report Status PENDING  Incomplete  Culture, blood (routine x 2)     Status: None (Preliminary result)   Collection Time: 05/09/20  9:20 AM   Specimen: BLOOD RIGHT HAND  Result Value Ref Range Status   Specimen Description BLOOD RIGHT HAND  Final   Special Requests   Final    BOTTLES DRAWN AEROBIC ONLY Blood Culture results may not be optimal due to an inadequate volume of blood received in culture bottles   Culture   Final    NO GROWTH 3 DAYS Performed at [facility], [place] [address],  [place], [place] [address]    Report Status PENDING  Incomplete  MRSA PCR Screening     Status: None    Collection Time: 05/09/20  6:02 PM   Specimen: Nasopharyngeal  Result Value Ref Range Status   MRSA by PCR NEGATIVE NEGATIVE Final    Comment:        The GeneXpert MRSA Assay (FDA approved for NASAL specimens only), is one component of a comprehensive MRSA colonization surveillance program. It is not intended to diagnose MRSA infection nor to guide or monitor treatment for MRSA infections. Performed at [facility], [place] [address] [place], [place] [address]          Radiology Studies: No results found.      Scheduled Meds: . amLODipine  5 mg Oral Daily  . apixaban  10 mg Oral BID   Followed by  . [START ON 05/17/2020] apixaban  5 mg Oral BID  . cefdinir  300 mg Oral Q12H  . Chlorhexidine Gluconate Cloth  6 each Topical Daily  . gabapentin  800 mg Oral Q0600  . levETIRAcetam  500 mg Oral BID  . mouth rinse  15 mL Mouth Rinse BID   Continuous Infusions:   LOS: 3 days    Time spent: 25 minutes    Barb Merino, MD [facility] Pager [phone]

## 2020-05-12 NOTE — Evaluation (Signed)
Occupational Therapy Evaluation Patient Details Name: Brian [name] MRN: [id number] DOB: 08-15-1945 Today's Date: 05/12/2020    History of Present Illness 75 year old gentleman admitted with multiple falls and somnolence.Pt  states he might have taken double dose of tramadol along with gabapentin. On arrival to ED, pt hypotensive with blood pressure of 71/48.  Resuscitated with fluid.  Found to have AKI, bilateral PE.   PMH:  active cocaine use, smoker, history of a stroke with left-sided hemiparesis, brain aneurysm on anticoagulation and bleeding on 1/21, spontaneous subdural hematoma on 2/21 status post burr hole and evacuation, seizure disorders   Clinical Impression   Pt PTA: Pt lives with friends and has PCA daily. Pt requires assist with ADL and mobility. Pt with L sided residual weakness from previous CVA.  Pt currently limited by decreased strength, decreased ability to care for self, decreased activity tolerance and decreased mobility. 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 Pt set-upA to maxA for ADL and modA for transfers (safer with +2 assist). Pt would benefit from continued OT skilled services. OT following acutely.  Seated in recliner 111/81, 112 BPM 99% O2 after exertion. No dizziness reported.      Follow Up Recommendations  SNF    Equipment Recommendations  3 in 1 bedside commode    Recommendations for Other Services       Precautions / Restrictions Precautions Precautions: Fall Restrictions Weight Bearing Restrictions: No      Mobility Bed Mobility Overal bed mobility: Needs Assistance Bed Mobility: Supine to Sit     Supine to sit: Mod assist     General bed mobility comments: heavy use of rail coming to R and assist scooting to EOB; minguardA at EOB    Transfers Overall transfer level: Needs assistance Equipment used: Rolling walker (2 wheeled) Transfers: Sit to/from  [employer] Sit to Stand: Mod assist   Squat pivot transfers: Mod assist     General transfer comment: Pt modA for power up; pt standing x1 times, but preferring to squat pivot to recliner due to lack of RW    Balance Overall balance assessment: Needs assistance Sitting-balance support: No upper extremity supported;Feet supported Sitting balance-Leahy Scale: Fair     Standing balance support: Bilateral upper extremity supported;During functional activity Standing balance-Leahy Scale: Poor Standing balance comment: BUE support                           ADL either performed or assessed with clinical judgement   ADL Overall ADL's : Needs assistance/impaired Eating/Feeding: Minimal assistance;Sitting   Grooming: Minimal assistance;Sitting   Upper Body Bathing: Minimal assistance;Sitting   Lower Body Bathing: Total assistance;Sitting/lateral leans;Sit to/from stand   Upper Body Dressing : Minimal assistance;Sitting   Lower Body Dressing: Total assistance;Sitting/lateral leans;Sit to/from stand;Cueing for safety Lower Body Dressing Details (indicate cue type and reason): "my aide do that." Toilet Transfer: Moderate assistance;Stand-pivot;BSC Toilet Transfer Details (indicate cue type and reason): simulated to recliner. pt is used to a RW, could have been better, but no RWs on unit unused. Toileting- Clothing Manipulation and Hygiene: Total assistance;Sitting/lateral lean;Sit to/from stand       Functional mobility during ADLs: Moderate assistance;Rolling walker;Cueing for safety General ADL Comments: Pt limited by decreased strength, decreased ability to care for self, decreased activity tolerance and decreased mobility. 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     Vision Baseline Vision/History: No visual deficits Patient Visual Report: No change from baseline Vision  Assessment?: Vision impaired- to be further tested in functional context Additional Comments: Pt able to see date on board written large and scanning room well.     Perception     Praxis      Pertinent Vitals/Pain Pain Assessment: Faces Faces Pain Scale: Hurts little more Pain Location: generalized Pain Descriptors / Indicators: Aching;Grimacing Pain Intervention(s): Limited activity within patient's tolerance     Hand Dominance Right   Extremity/Trunk Assessment Upper Extremity Assessment Upper Extremity Assessment: Generalized weakness;RUE deficits/detail;LUE deficits/detail RUE Deficits / Details: 2/5 MM grade shoulder through digits LUE Deficits / Details: s/p previous CVA, stiffness and poor grip in hand   Lower Extremity Assessment Lower Extremity Assessment: Generalized weakness   Cervical / Trunk Assessment Cervical / Trunk Assessment: Kyphotic   Communication Communication Communication: Other (comment) (low volume, mumbling)   Cognition Arousal/Alertness: Awake/alert Behavior During Therapy: WFL for tasks assessed/performed Overall Cognitive Status: Impaired/Different from baseline Area of Impairment: Memory;Following commands;Safety/judgement;Awareness;Problem solving                     Memory: Decreased short-term memory Following Commands: Follows one step commands with increased time;Follows one step commands inconsistently Safety/Judgement: Decreased awareness of safety;Decreased awareness of deficits   Problem Solving: Slow processing;Decreased initiation;Requires verbal cues;Difficulty sequencing     General Comments  Seated in recliner 111/81, 112 BPM 99% O2 after exertion. No dizziness reported.    Exercises     Shoulder Instructions      Home Living Family/patient expects to be discharged to:: Private residence Living Arrangements: Non-relatives/Friends Available Help at Discharge: Family;Personal care attendant Type of Home:  House Home Access: Level entry     Home Layout: One level     Bathroom Shower/Tub: Teacher, early years/pre: Standard Bathroom Accessibility: Yes   Home Equipment: Environmental consultant - 2 wheels;Shower seat;Grab bars - tub/shower;Cane - quad;Wheelchair - power          Prior Functioning/Environment Level of Independence: Independent with assistive device(s);Needs assistance  Gait / Transfers Assistance Needed: states he used RW vs cane at home with and without assist ADL's / Homemaking Assistance Needed: aide assist with ADLs, son and daughter assist pt at night            OT Problem List: Decreased strength;Decreased activity tolerance;Impaired balance (sitting and/or standing);Decreased cognition;Decreased safety awareness;Pain;Cardiopulmonary status limiting activity      OT Treatment/Interventions: Self-care/ADL training;Therapeutic exercise;Energy conservation;DME and/or AE instruction;Therapeutic activities;Cognitive remediation/compensation;Balance training;Patient/family education    OT Goals(Current goals can be found in the care plan section) Acute Rehab OT Goals Patient Stated Goal: to get out of here OT Goal Formulation: With patient Time For Goal Achievement: 05/26/20 Potential to Achieve Goals: Good ADL Goals Pt Will Perform Grooming: with supervision;sitting Pt Will Transfer to Toilet: ambulating;bedside commode;with min assist Pt/caregiver will Perform Home Exercise Program: Right Upper extremity;With written HEP provided;Increased ROM;Increased strength Additional ADL Goal #1: Pt will increase to set-upA for OOB grooming/UB ADL to increase independence. Additional ADL Goal #2: Pt will tolerate sitting in recliner x3 hours in order to increase OOB tolerance.  OT Frequency: Min 2X/week   Barriers to D/C: Decreased caregiver support  not 24/7       Co-evaluation              AM-PAC OT "6 Clicks" Daily Activity     Outcome  Measure Help from another  person eating meals?: A Little Help from another person taking care of personal grooming?: A Little Help from another person toileting, which includes using toliet, bedpan, or urinal?: Total Help from another person bathing (including washing, rinsing, drying)?: A Lot Help from another person to put on and taking off regular upper body clothing?: A Lot Help from another person to put on and taking off regular lower body clothing?: Total 6 Click Score: 12   End of Session Equipment Utilized During Treatment: Gait belt Nurse Communication: Mobility status  Activity Tolerance: Patient tolerated treatment well;Patient limited by fatigue Patient left: in chair;with call bell/phone within reach;with chair alarm set  OT Visit Diagnosis: Unsteadiness on feet (R26.81);Muscle weakness (generalized) (M62.81);Other symptoms and signs involving cognitive function                Time: 1407-1430 OT Time Calculation (min): 23 min Charges:  OT General Charges $OT Visit: 1 Visit OT Evaluation $OT Eval Moderate Complexity: 1 Mod OT Treatments $Self Care/Home Management : 8-22 mins  Jefferey Pica, OTR/L Acute Rehabilitation Services Pager: [phone] Office: [phone]   Harshini Trent C 05/12/2020, 5:52 PM
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
## 2020-05-13 DIAGNOSIS — L0390 Cellulitis, unspecified: Secondary | ICD-10-CM | POA: Diagnosis present

## 2020-05-13 DIAGNOSIS — G9341 Metabolic encephalopathy: Secondary | ICD-10-CM | POA: Diagnosis present

## 2020-05-13 DIAGNOSIS — I2694 Multiple subsegmental pulmonary emboli without acute cor pulmonale: Secondary | ICD-10-CM | POA: Diagnosis not present

## 2020-05-13 LAB — GLUCOSE, CAPILLARY
Glucose-Capillary: 84 mg/dL (ref 70–99)
Glucose-Capillary: 85 mg/dL (ref 70–99)
Glucose-Capillary: 112 mg/dL — ABNORMAL HIGH (ref 70–99)
Glucose-Capillary: 84 mg/dL (ref 70–99)
Glucose-Capillary: 102 mg/dL — ABNORMAL HIGH (ref 70–99)
Glucose-Capillary: 89 mg/dL (ref 70–99)

## 2020-05-13 MED ORDER — NICOTINE 21 MG/24HR TD PT24
21.0000 mg | MEDICATED_PATCH | Freq: Every day | TRANSDERMAL | Status: DC
  Administered 2020-05-13 – 2020-06-01 (×20): 21 mg via TRANSDERMAL
  Filled 2020-05-13 (×21): qty 1

## 2020-05-13 NOTE — Progress Notes (Signed)
Physical Therapy Treatment Patient Details Name: Brian [name] MRN: [id number] DOB: 04-09-1945 Today's Date: 05/13/2020    History of Present Illness 75 year old gentleman admitted with multiple falls and somnolence.Pt  states he might have taken double dose of tramadol along with gabapentin. On arrival to ED, pt hypotensive with blood pressure of 71/48.  Resuscitated with fluid.  Found to have AKI, bilateral PE.   PMH:  active cocaine use, smoker, history of a stroke with left-sided hemiparesis, brain aneurysm on anticoagulation and bleeding on 1/21, spontaneous subdural hematoma on 2/21 status post burr hole and evacuation, seizure disorders    PT Comments    Patient sleeping on arrival and remained groggy after awakened. Despite best efforts, pt refusing to work on getting OOB due to being "up in chair all day yesterday and no one would put me back to bed." Noted bed pad wet and pt assisted to roll rt and lt for linen change and reposition from left side to right side. RN made aware condom catheter was off on PT arrival and reason for pt's refusal to work on getting up on his feet.     Follow Up Recommendations  SNF;Supervision/Assistance - 24 hour     Equipment Recommendations  None recommended by PT    Recommendations for Other Services       Precautions / Restrictions Precautions Precautions: Fall    Mobility  Bed Mobility Overal bed mobility: Needs Assistance Bed Mobility: Rolling Rolling: Max assist         General bed mobility comments: Noted bed pad wet and condom catheter had fallen off. Engaged pt to assist with getting wet pad out from under him and to rotate from his left side to his right side. Pt initially cooperativing and then began stretching legs out as PT asking him to keep them bent.    Transfers                    Ambulation/Gait                 Stairs             Wheelchair Mobility    Modified Rankin (Stroke Patients  Only)       Balance                                            Cognition Arousal/Alertness: Lethargic Behavior During Therapy: Agitated Overall Cognitive Status: No family/caregiver present to determine baseline cognitive functioning                                 General Comments: pt asleep on arrival; moderately difficult to arouse; began LE ROM to assist with arousal and pt began moaning and stating no to getting OOB.      Exercises      General Comments General comments (skin integrity, edema, etc.): Discussed pt's behavior with RN and unable to persuade pt to participate further      Pertinent Vitals/Pain Pain Assessment: Faces Faces Pain Scale: Hurts even more Pain Location: generalized Pain Descriptors / Indicators: Grimacing;Moaning Pain Intervention(s): Monitored during session;Repositioned    Home Living                      Prior Function  PT Goals (current goals can now be found in the care plan section) Acute Rehab PT Goals Patient Stated Goal: to get out of here Time For Goal Achievement: 05/25/20 Potential to Achieve Goals: Good Progress towards PT goals: Not progressing toward goals - comment    Frequency    Min 3X/week      PT Plan Current plan remains appropriate    Co-evaluation              AM-PAC PT "6 Clicks" Mobility   Outcome Measure  Help needed turning from your back to your side while in a flat bed without using bedrails?: A Lot Help needed moving from lying on your back to sitting on the side of a flat bed without using bedrails?: Total Help needed moving to and from a bed to a chair (including a wheelchair)?: Total Help needed standing up from a chair using your arms (e.g., wheelchair or bedside chair)?: Total Help needed to walk in hospital room?: Total Help needed climbing 3-5 steps with a railing? : Total 6 Click Score: 7    End of Session   Activity  Tolerance: Treatment limited secondary to agitation (beginning to resist movements to reposition) Patient left: in bed;with call bell/phone within reach;with bed alarm set;with nursing/sitter in room Nurse Communication: Other (comment) (refusal; condom catheter off on arrival) PT Visit Diagnosis: Unsteadiness on feet (R26.81);Muscle weakness (generalized) (M62.81)     Time: 3875-6433 PT Time Calculation (min) (ACUTE ONLY): 14 min  Charges:  $Therapeutic Activity: 8-22 mins                      Brian [name], PT Pager [phone]    Brian [name] 05/13/2020, 2:55 PM

## 2020-05-13 NOTE — TOC Benefit Eligibility Note (Signed)
Transition of Care [facility]) Benefit Eligibility Note    Patient Details  Name: Brian [name] MRN: [id number] Date of Birth: 07-Aug-1945   Medication/Dose: Arne Cleveland  2.5 MG BID CO-PAY- ZERO DOLLARS  and  ELIQUIS  5 MG BID  CO-PAY- ZERO DOLLARS  Covered?: Yes  Tier:  (PREFERRED)  Prescription [facility]age [facility]  Spoke with Person/Company/Phone Number:: [employer]  @ [facility] [employer] # [phone] OPT- MEMBER  Co-Pay: ZERO DOLLARS  Prior Approval: No  Deductible: Unmet  Additional Notes: APIXABAN  and  ELIQUIS  10 MG BID : Crecencio Mc Phone Number: 05/13/2020, 5:02 PM

## 2020-05-13 NOTE — Progress Notes (Signed)
PROGRESS NOTE    Brian [name]  SWH:[id number] DOB: [birth year] DOA: 05/09/2020 PCP: [facility]    Brief Narrative:  YAHIR TAVANO is a 75 year old male with past medical history significant for CVA with resultant left-sided hemiparesis, seizure disorder, SDH s/p bur hole February 2021, brain aneurysm, active cocaine abuse, tobacco abuse who presented to the ED with multiple falls shortness of breath, and somnolence.  Upon EMS arrival, he was noted to be hypotensive with a blood pressure of 71/48, some concern at home that he overmedicated himself with pain medications with tramadol and gabapentin.  In the ED, patient was noted to be confused, hypotensive, hypoxic, and was further resuscitated with 2 L normal saline.  Labs significant for potassium 5.2, bicarb 18, glucose 134, BUN 20, creatinine 2, albumin 2.5, AST 60, WBC 11.6, hemoglobin 12.1, lactic acid 6.5.  High-sensitivity troponin [id number].  EKG with significant ST depression which resolved once patient was oxygenated.  CT head negative for acute process.  CTA PE showing acute PE, diffuse right lung lobar and segmental involvement and lesser segmental involvement in the left lung.  Patient was started on heparin infusion.  PCCM was called for admission.  Patient transferred from [facility] to [facility] on 05/11/2020   Assessment & Plan:   Active Problems:   Pulmonary emboli (HCC)   Pressure injury of skin   Acute metabolic encephalopathy, POA Cocaine abuse Hx CVA with chronic left-sided deficits Hx SAH/SDH 03/2019 Questionable seizure disorder Patient presented with confusion, notably hypotensive and hypoxic.  CT head/cervical spine negative for acute process.  Ammonia level 18, TSH 1.142.  EEG within normal limits.  Etiology likely secondary to continued substance abuse as well as pulmonary embolism. --Mental status now back at baseline --Keppra 500 mg p.o. twice daily  Acute lobar and segmental pulmonary  embolism Acute hypoxic respiratory failure, POA Presentation, patient noted to be hypoxic.  CT angiogram chest notable for bilateral acute pulmonary emboli with diffuse right lung lobar and segmental involvement, lesser segmental involvement in the left lung.  TTE with LVEF 65-70%, normal LV function, no regional wall motion normalities, concentric LVH, grade 1 diastolic dysfunction, RA mildly dilated, RV moderately dilated with moderate systolic dysfunction exaggerated apical motion suspicious for a McConnell sign and pulmonary hypertension.  Patient was initially started on heparin drip and transition to apixaban.  Initially requiring supple oxygen, now titrated off. --Continue apixaban  Left flank and groin cellulitis Procalcitonin elevated on admission.  Blood cultures negative.  Completed 2 days of cefepime, now transition to [facility] to complete 7-day course.  Elevated troponin 2/2 type II demand ischemia High-sensitivity troponin noted to be elevated on admission, [phone].  Etiology likely secondary to right heart strain from acute PE as above versus active cocaine abuse.  The with preserved LVEF with no regional wall motion abnormalities.  Above, counseled on need for cocaine cessation.  Acute renal failure: Resolved Creatinine elevated 2.00 on admission, likely prerenal azotemia from dehydration.  Baseline creatinine 1.00.  Now resolved following IV fluid hydration.  Essential hypertension BP 128/84 this morning, well controlled. --Amlodipine 5 mg p.o. daily --Avoid beta-blockers in the setting of active cocaine use  Chronic pain --Percocet as needed --Gabapentin 800 mg p.o. daily  Polysubstance abuse, cocaine use Positive for cocaine on admission.  Also history of previous THC abuse.  Patient counseled extensively on need for total abstinence of illicit substances given his history of SAH.  Patient seems to be receptive and wants to quit.  Continue to encourage complete  cessation   DVT prophylaxis: Eliquis   Code Status: Full Code Family Communication: No family present at bedside this morning  Disposition Plan:  Level of care: Telemetry Medical Status is: Inpatient  Remains inpatient appropriate because:Unsafe d/c plan   Dispo:  Patient From: Home  Planned Disposition: [facility], pending insurance authorization  Medically stable for discharge: Yes      Consultants:   PCCM  Procedures:   EEG  Antimicrobials:   Cefepime 3/19 - 3/20  Vancomycin 3/19-3 20  Omnicef 3/21>>  Cefazolin 3/20-3/21   Subjective: Patient seen and examined at bedside, resting comfortably.  No specific complaints this morning.  Awaiting SNF placement.  Denies headache, no chest pain, no palpitations, no shortness of breath, no abdominal pain.  No acute events overnight per nursing staff.  Objective: Vitals:   05/13/20 0800 05/13/20 1000 05/13/20 1103 05/13/20 1200  BP: (!) 150/107 112/85  126/73  Pulse:      Resp: (!) 26 (!) 28  20  Temp:   (!) 100.9 F (38.3 C)   TempSrc:   Oral   SpO2:      Weight:      Height:        Intake/Output Summary (Last 24 hours) at 05/13/2020 1422 Last data filed at 05/13/2020 0800 Gross per 24 hour  Intake 120 ml  Output 100 ml  Net 20 ml   Filed Weights   05/11/20 0309 05/12/20 0500 05/13/20 0500  Weight: 71.4 kg 69.1 kg 69.2 kg    Examination:  General exam: Appears calm and comfortable, chronically ill and debilitated in appearance Respiratory system: Clear to auscultation. Respiratory effort normal. Cardiovascular system: S1 & S2 heard, RRR. No JVD, murmurs, rubs, gallops or clicks. No pedal edema. Gastrointestinal system: Abdomen is nondistended, soft and nontender. No organomegaly or masses felt. Normal bowel sounds heard. Central nervous system: Alert and oriented. No focal neurological deficits. Extremities: Symmetric 5 x 5 power. Skin: No rashes, lesions or  ulcers Psychiatry: Judgement and insight appear poor. Mood & affect appropriate.     Data Reviewed: I have personally reviewed following labs and imaging studies  CBC: Recent Labs  Lab 05/09/20 0448 05/09/20 0506 05/09/20 0920 05/09/20 1108 05/10/20 0234 05/11/20 0252  WBC 11.6*  --   --   --  8.4 7.6  NEUTROABS 7.8*  --   --   --   --   --   HGB 12.1* 12.6* 11.9* 11.6* 10.9* 11.4*  HCT 38.5* 37.0* 38.2* 34.0* 33.5* 34.9*  MCV 91.0  --   --   --  88.2 86.2  PLT 171  --   --   --  144* 637   Basic Metabolic Panel: Recent Labs  Lab 05/09/20 0448 05/09/20 0506 05/09/20 0920 05/09/20 1108 05/10/20 0234  NA 136 139  --  143 138  K 5.2* 4.9  --  4.3 3.9  CL 103 107  --   --  111  CO2 18*  --   --   --  20*  GLUCOSE 134* 134*  --   --  85  BUN 30* 44*  --   --  16  CREATININE 2.00* 1.90*  --   --  1.00  CALCIUM 8.3*  --   --   --  8.1*  MG  --   --  1.9  --   --    GFR: Estimated Creatinine Clearance: 62.4 mL/min (by C-G formula  based on SCr of 1 mg/dL). Liver Function Tests: Recent Labs  Lab 05/09/20 0448 05/10/20 0234  AST 60* 30  ALT 31 24  ALKPHOS 58 52  BILITOT 2.0* 0.6  PROT 7.3 6.8  ALBUMIN 2.5* 2.3*   Recent Labs  Lab 05/09/20 0448  LIPASE 23   Recent Labs  Lab 05/09/20 0923  AMMONIA 18   Coagulation Profile: Recent Labs  Lab 05/09/20 0920  INR 1.5*   Cardiac Enzymes: No results for input(s): CKTOTAL, CKMB, CKMBINDEX, TROPONINI in the last 168 hours. BNP (last 3 results) No results for input(s): PROBNP in the last 8760 hours. HbA1C: No results for input(s): HGBA1C in the last 72 hours. CBG: Recent Labs  Lab 05/12/20 1918 05/12/20 2305 05/13/20 0304 05/13/20 0718 05/13/20 1103  GLUCAP 104* 97 84 85 112*   Lipid Profile: No results for input(s): CHOL, HDL, LDLCALC, TRIG, CHOLHDL, LDLDIRECT in the last 72 hours. Thyroid Function Tests: No results for input(s): TSH, T4TOTAL, FREET4, T3FREE, THYROIDAB in the last 72 hours. Anemia  Panel: No results for input(s): VITAMINB12, FOLATE, FERRITIN, TIBC, IRON, RETICCTPCT in the last 72 hours. Sepsis Labs: Recent Labs  Lab 05/09/20 0447 05/09/20 0654 05/09/20 0920 05/10/20 0234 05/11/20 0252  PROCALCITON  --   --  2.84 4.47 2.16  LATICACIDVEN 6.5* 2.7*  --   --   --     Recent Results (from the past 240 hour(s))  SARS CORONAVIRUS 2 (TAT 6-24 HRS) Nasopharyngeal Nasopharyngeal Swab     Status: None   Collection Time: 05/09/20  4:55 AM   Specimen: Nasopharyngeal Swab  Result Value Ref Range Status   SARS Coronavirus 2 NEGATIVE NEGATIVE Final    Comment: (NOTE) SARS-CoV-2 target nucleic acids are NOT DETECTED.  The SARS-CoV-2 RNA is generally detectable in upper and lower respiratory specimens during the acute phase of infection. Negative results do not preclude SARS-CoV-2 infection, do not rule out co-infections with other pathogens, and should not be used as the sole basis for treatment or other patient management decisions. Negative results must be combined with clinical observations, patient history, and epidemiological information. The expected result is Negative.  Fact Sheet for Patients: SugarRoll.be  Fact Sheet for Healthcare Providers: https://www.woods-mathews.com/  This test is not yet approved or cleared by the [place] FDA and  has been authorized for detection and/or diagnosis of SARS-CoV-2 by FDA under an Emergency Use Authorization (EUA). This EUA will remain  in effect (meaning this test can be used) for the duration of the COVID-19 declaration under Se ction 564(b)(1) of the Act, 21 U.S.C. section 360bbb-3(b)(1), unless the authorization is terminated or revoked sooner.  Performed at [facility], [place] [address], [place], [place] [address]   Culture, blood (routine x 2)     Status: None (Preliminary result)   Collection Time: 05/09/20  9:20 AM   Specimen: BLOOD  Result Value Ref Range  Status   Specimen Description BLOOD RIGHT ANTECUBITAL  Final   Special Requests   Final    BOTTLES DRAWN AEROBIC ONLY Blood Culture results may not be optimal due to an inadequate volume of blood received in culture bottles   Culture   Final    NO GROWTH 4 DAYS Performed at [facility], [place] [address] [place], [place] [address]    Report Status PENDING  Incomplete  Culture, blood (routine x 2)     Status: None (Preliminary result)   Collection Time: 05/09/20  9:20 AM   Specimen:  BLOOD RIGHT HAND  Result Value Ref Range Status   Specimen Description BLOOD RIGHT HAND  Final   Special Requests   Final    BOTTLES DRAWN AEROBIC ONLY Blood Culture results may not be optimal due to an inadequate volume of blood received in culture bottles   Culture   Final    NO GROWTH 4 DAYS Performed at [facility], [place] [address] [place], [place] [address]    Report Status PENDING  Incomplete  MRSA PCR Screening     Status: None   Collection Time: 05/09/20  6:02 PM   Specimen: Nasopharyngeal  Result Value Ref Range Status   MRSA by PCR NEGATIVE NEGATIVE Final    Comment:        The GeneXpert MRSA Assay (FDA approved for NASAL specimens only), is one component of a comprehensive MRSA colonization surveillance program. It is not intended to diagnose MRSA infection nor to guide or monitor treatment for MRSA infections. Performed at [facility], [place] [address] [place], [place] [address]          Radiology Studies: No results found.      Scheduled Meds: . amLODipine  5 mg Oral Daily  . apixaban  10 mg Oral BID   Followed by  . [START ON 05/17/2020] apixaban  5 mg Oral BID  . cefdinir  300 mg Oral Q12H  . Chlorhexidine Gluconate Cloth  6 each Topical Daily  . gabapentin  800 mg Oral Q0600  . levETIRAcetam  500 mg Oral BID  . mouth rinse  15 mL Mouth Rinse BID   Continuous Infusions:   LOS: 4 days    Time spent: 38 minutes spent on chart review,  discussion with nursing staff, consultants, updating family and interview/physical exam; more than 50% of that time was spent in counseling and/or coordination of care.    Doni Widmer J [place] DO [facility] Available via Epic secure chat 7am-7pm After these hours, please refer to coverage provider listed on [email or web address] 05/13/2020, 2:22 PM

## 2020-05-13 NOTE — Progress Notes (Addendum)
CSW spoke with patient to present him with bed offer from [place] - patient agreeable.  CSW spoke with Helene Kelp at [facility] who will initiate insurance authorization.  Madilyn Fireman, MSW, LCSW Transitions of Care  Clinical Social Worker II [phone]

## 2020-05-14 DIAGNOSIS — N179 Acute kidney failure, unspecified: Secondary | ICD-10-CM | POA: Diagnosis not present

## 2020-05-14 DIAGNOSIS — G9341 Metabolic encephalopathy: Secondary | ICD-10-CM | POA: Diagnosis not present

## 2020-05-14 DIAGNOSIS — I2694 Multiple subsegmental pulmonary emboli without acute cor pulmonale: Secondary | ICD-10-CM | POA: Diagnosis not present

## 2020-05-14 DIAGNOSIS — L03314 Cellulitis of groin: Secondary | ICD-10-CM

## 2020-05-14 LAB — CULTURE, BLOOD (ROUTINE X 2)
Culture: NO GROWTH
Culture: NO GROWTH

## 2020-05-14 LAB — GLUCOSE, CAPILLARY
Glucose-Capillary: 88 mg/dL (ref 70–99)
Glucose-Capillary: 95 mg/dL (ref 70–99)
Glucose-Capillary: 85 mg/dL (ref 70–99)
Glucose-Capillary: 99 mg/dL (ref 70–99)

## 2020-05-14 LAB — SARS CORONAVIRUS 2 (TAT 6-24 HRS): SARS Coronavirus 2: NEGATIVE

## 2020-05-14 MED ORDER — POLYETHYLENE GLYCOL 3350 17 G PO PACK
17.0000 g | PACK | Freq: Once | ORAL | Status: DC
  Filled 2020-05-14: qty 1

## 2020-05-14 MED ORDER — SENNOSIDES-DOCUSATE SODIUM 8.6-50 MG PO TABS
2.0000 | ORAL_TABLET | Freq: Two times a day (BID) | ORAL | Status: DC
  Administered 2020-05-14 – 2020-06-01 (×18): 2 via ORAL
  Filled 2020-05-14 (×33): qty 2

## 2020-05-14 NOTE — Progress Notes (Signed)
PROGRESS NOTE    Brian [name]  HAL:[id number] DOB: [birth year] DOA: 05/09/2020 PCP: [facility]    Brief Narrative:  Brian [name] is a 75 year old male with past medical history significant for CVA with resultant left-sided hemiparesis, seizure disorder, SDH s/p bur hole February 2021, brain aneurysm, active cocaine abuse, tobacco abuse who presented to the ED with multiple falls shortness of breath, and somnolence.  Upon EMS arrival, he was noted to be hypotensive with a blood pressure of 71/48, some concern at home that he overmedicated himself with pain medications with tramadol and gabapentin.  In the ED, patient was noted to be confused, hypotensive, hypoxic, and was further resuscitated with 2 L normal saline.  Labs significant for potassium 5.2, bicarb 18, glucose 134, BUN 20, creatinine 2, albumin 2.5, AST 60, WBC 11.6, hemoglobin 12.1, lactic acid 6.5.  High-sensitivity troponin [id number].  EKG with significant ST depression which resolved once patient was oxygenated.  CT head negative for acute process.  CTA PE showing acute PE, diffuse right lung lobar and segmental involvement and lesser segmental involvement in the left lung.  Patient was started on heparin infusion.  PCCM was called for admission.  Patient [facility] on 05/11/2020   Assessment & Plan:   Principal Problem:   Pulmonary emboli [facility]) Active Problems:   Renal failure, acute (HCC)   Pressure injury of skin   Acute metabolic encephalopathy   Cellulitis   Acute metabolic encephalopathy, POA Cocaine abuse Hx CVA with chronic left-sided deficits Hx SAH/SDH 03/2019 Questionable seizure disorder Patient presented with confusion, notably hypotensive and hypoxic.  CT head/cervical spine negative for acute process.  Ammonia level 18, TSH 1.142.  EEG within normal limits.  Etiology likely secondary to continued substance abuse as well as pulmonary embolism. --Mental  status now back at baseline --Keppra 500 mg p.o. twice daily  Acute lobar and segmental pulmonary embolism Acute hypoxic respiratory failure, POA Presentation, patient noted to be hypoxic.  CT angiogram chest notable for bilateral acute pulmonary emboli with diffuse right lung lobar and segmental involvement, lesser segmental involvement in the left lung.  TTE with LVEF 65-70%, normal LV function, no regional wall motion normalities, concentric LVH, grade 1 diastolic dysfunction, RA mildly dilated, RV moderately dilated with moderate systolic dysfunction exaggerated apical motion suspicious for a McConnell sign and pulmonary hypertension.  Patient was initially started on heparin drip and transition to apixaban.  Initially requiring supple oxygen, now titrated off. --Continue apixaban  Left flank and groin cellulitis Procalcitonin elevated on admission.  Blood cultures negative.  Completed 2 days of cefepime, now transition to [facility] to complete 7-day course.  Elevated troponin 2/2 type II demand ischemia High-sensitivity troponin noted to be elevated on admission, [phone].  Etiology likely secondary to right heart strain from acute PE as above versus active cocaine abuse.  The with preserved LVEF with no regional wall motion abnormalities.  Above, counseled on need for cocaine cessation.  Acute renal failure: Resolved Creatinine elevated 2.00 on admission, likely prerenal azotemia from dehydration.  Baseline creatinine 1.00.  Now resolved following IV fluid hydration.  Essential hypertension BP 138/92 this morning, well controlled. --Amlodipine 5 mg p.o. daily --Avoid beta-blockers in the setting of active cocaine use  Chronic pain --Percocet as needed --Gabapentin 800 mg p.o. daily  Polysubstance abuse, cocaine use Positive for cocaine on admission.  Also history of previous THC abuse.  Patient counseled extensively on need for total abstinence of illicit substances  given his  history of SAH.  Patient seems to be receptive and wants to quit.  Continue to encourage complete cessation   DVT prophylaxis: Eliquis   Code Status: Full Code Family Communication: No family present at bedside this morning  Disposition Plan:  Level of care: Telemetry Medical Status is: Inpatient  Remains inpatient appropriate because:Unsafe d/c plan   Dispo:  Patient From: Home  Planned Disposition: [facility], pending insurance authorization  Medically stable for discharge: Yes      Consultants:   PCCM  Procedures:   EEG  Antimicrobials:   Cefepime 3/19 - 3/20  Vancomycin 3/19-3 20  Omnicef 3/21>>  Cefazolin 3/20-3/21   Subjective: Patient seen and examined at bedside, resting comfortably.  No specific complaints this morning.  Awaiting insurance authorization for SNF placement.  Denies headache, no chest pain, no palpitations, no shortness of breath, no abdominal pain.  No acute events overnight per nursing staff.  Objective: Vitals:   05/14/20 0333 05/14/20 0400 05/14/20 0743 05/14/20 0800  BP:  (!) 138/92    Pulse:  87  93  Resp:  (!) 27  (!) 26  Temp: 97.8 F (36.6 C)  98.2 F (36.8 C)   TempSrc: Oral  Oral   SpO2:  92%  (!) 88%  Weight:      Height:        Intake/Output Summary (Last 24 hours) at 05/14/2020 1125 Last data filed at 05/14/2020 0000 Gross per 24 hour  Intake 360 ml  Output 275 ml  Net 85 ml   Filed Weights   05/11/20 0309 05/12/20 0500 05/13/20 0500  Weight: 71.4 kg 69.1 kg 69.2 kg    Examination:  General exam: Appears calm and comfortable, chronically ill and debilitated in appearance Respiratory system: Clear to auscultation. Respiratory effort normal. Cardiovascular system: S1 & S2 heard, RRR. No JVD, murmurs, rubs, gallops or clicks. No pedal edema. Gastrointestinal system: Abdomen is nondistended, soft and nontender. No organomegaly or masses felt. Normal bowel sounds heard. Central  nervous system: Alert and oriented. No focal neurological deficits. Extremities: Symmetric 5 x 5 power. Skin: No rashes, lesions or ulcers Psychiatry: Judgement and insight appear poor. Mood & affect appropriate.     Data Reviewed: I have personally reviewed following labs and imaging studies  CBC: Recent Labs  Lab 05/09/20 0448 05/09/20 0506 05/09/20 0920 05/09/20 1108 05/10/20 0234 05/11/20 0252  WBC 11.6*  --   --   --  8.4 7.6  NEUTROABS 7.8*  --   --   --   --   --   HGB 12.1* 12.6* 11.9* 11.6* 10.9* 11.4*  HCT 38.5* 37.0* 38.2* 34.0* 33.5* 34.9*  MCV 91.0  --   --   --  88.2 86.2  PLT 171  --   --   --  144* 694   Basic Metabolic Panel: Recent Labs  Lab 05/09/20 0448 05/09/20 0506 05/09/20 0920 05/09/20 1108 05/10/20 0234  NA 136 139  --  143 138  K 5.2* 4.9  --  4.3 3.9  CL 103 107  --   --  111  CO2 18*  --   --   --  20*  GLUCOSE 134* 134*  --   --  85  BUN 30* 44*  --   --  16  CREATININE 2.00* 1.90*  --   --  1.00  CALCIUM 8.3*  --   --   --  8.1*  MG  --   --  1.9  --   --    GFR: Estimated Creatinine Clearance: 62.4 mL/min (by C-G formula based on SCr of 1 mg/dL). Liver Function Tests: Recent Labs  Lab 05/09/20 0448 05/10/20 0234  AST 60* 30  ALT 31 24  ALKPHOS 58 52  BILITOT 2.0* 0.6  PROT 7.3 6.8  ALBUMIN 2.5* 2.3*   Recent Labs  Lab 05/09/20 0448  LIPASE 23   Recent Labs  Lab 05/09/20 0923  AMMONIA 18   Coagulation Profile: Recent Labs  Lab 05/09/20 0920  INR 1.5*   Cardiac Enzymes: No results for input(s): CKTOTAL, CKMB, CKMBINDEX, TROPONINI in the last 168 hours. BNP (last 3 results) No results for input(s): PROBNP in the last 8760 hours. HbA1C: No results for input(s): HGBA1C in the last 72 hours. CBG: Recent Labs  Lab 05/13/20 1503 05/13/20 1911 05/13/20 2100 05/14/20 0715 05/14/20 1107  GLUCAP 84 102* 89 88 95   Lipid Profile: No results for input(s): CHOL, HDL, LDLCALC, TRIG, CHOLHDL, LDLDIRECT in the last  72 hours. Thyroid Function Tests: No results for input(s): TSH, T4TOTAL, FREET4, T3FREE, THYROIDAB in the last 72 hours. Anemia Panel: No results for input(s): VITAMINB12, FOLATE, FERRITIN, TIBC, IRON, RETICCTPCT in the last 72 hours. Sepsis Labs: Recent Labs  Lab 05/09/20 0447 05/09/20 0654 05/09/20 0920 05/10/20 0234 05/11/20 0252  PROCALCITON  --   --  2.84 4.47 2.16  LATICACIDVEN 6.5* 2.7*  --   --   --     Recent Results (from the past 240 hour(s))  SARS CORONAVIRUS 2 (TAT 6-24 HRS) Nasopharyngeal Nasopharyngeal Swab     Status: None   Collection Time: 05/09/20  4:55 AM   Specimen: Nasopharyngeal Swab  Result Value Ref Range Status   SARS Coronavirus 2 NEGATIVE NEGATIVE Final    Comment: (NOTE) SARS-CoV-2 target nucleic acids are NOT DETECTED.  The SARS-CoV-2 RNA is generally detectable in upper and lower respiratory specimens during the acute phase of infection. Negative results do not preclude SARS-CoV-2 infection, do not rule out co-infections with other pathogens, and should not be used as the sole basis for treatment or other patient management decisions. Negative results must be combined with clinical observations, patient history, and epidemiological information. The expected result is Negative.  Fact Sheet for Patients: SugarRoll.be  Fact Sheet for Healthcare Providers: https://www.woods-mathews.com/  This test is not yet approved or cleared by the [place] FDA and  has been authorized for detection and/or diagnosis of SARS-CoV-2 by FDA under an Emergency Use Authorization (EUA). This EUA will remain  in effect (meaning this test can be used) for the duration of the COVID-19 declaration under Se ction 564(b)(1) of the Act, 21 U.S.C. section 360bbb-3(b)(1), unless the authorization is terminated or revoked sooner.  Performed at [facility], [place] [address], [place], [place] [address]   Culture, blood  (routine x 2)     Status: None   Collection Time: 05/09/20  9:20 AM   Specimen: BLOOD  Result Value Ref Range Status   Specimen Description BLOOD RIGHT ANTECUBITAL  Final   Special Requests   Final    BOTTLES DRAWN AEROBIC ONLY Blood Culture results may not be optimal due to an inadequate volume of blood received in culture bottles   Culture   Final    NO GROWTH 5 DAYS Performed at [facility], [place] [address], [place], [place] [address]    Report Status 05/14/2020 FINAL  Final  Culture, blood (routine x 2)  Status: None   Collection Time: 05/09/20  9:20 AM   Specimen: BLOOD RIGHT HAND  Result Value Ref Range Status   Specimen Description BLOOD RIGHT HAND  Final   Special Requests   Final    BOTTLES DRAWN AEROBIC ONLY Blood Culture results may not be optimal due to an inadequate volume of blood received in culture bottles   Culture   Final    NO GROWTH 5 DAYS Performed at [facility], [place] [address] [place], [place] [address]    Report Status 05/14/2020 FINAL  Final  MRSA PCR Screening     Status: None   Collection Time: 05/09/20  6:02 PM   Specimen: Nasopharyngeal  Result Value Ref Range Status   MRSA by PCR NEGATIVE NEGATIVE Final    Comment:        The GeneXpert MRSA Assay (FDA approved for NASAL specimens only), is one component of a comprehensive MRSA colonization surveillance program. It is not intended to diagnose MRSA infection nor to guide or monitor treatment for MRSA infections. Performed at [facility], [place] [address] [place], [place] [address]   SARS CORONAVIRUS 2 (TAT 6-24 HRS) Nasopharyngeal Nasopharyngeal Swab     Status: None   Collection Time: 05/13/20  9:52 PM   Specimen: Nasopharyngeal Swab  Result Value Ref Range Status   SARS Coronavirus 2 NEGATIVE NEGATIVE Final    Comment: (NOTE) SARS-CoV-2 target nucleic acids are NOT DETECTED.  The SARS-CoV-2 RNA is generally detectable in upper and lower respiratory specimens during  the acute phase of infection. Negative results do not preclude SARS-CoV-2 infection, do not rule out co-infections with other pathogens, and should not be used as the sole basis for treatment or other patient management decisions. Negative results must be combined with clinical observations, patient history, and epidemiological information. The expected result is Negative.  Fact Sheet for Patients: SugarRoll.be  Fact Sheet for Healthcare Providers: https://www.woods-mathews.com/  This test is not yet approved or cleared by the [place] FDA and  has been authorized for detection and/or diagnosis of SARS-CoV-2 by FDA under an Emergency Use Authorization (EUA). This EUA will remain  in effect (meaning this test can be used) for the duration of the COVID-19 declaration under Se ction 564(b)(1) of the Act, 21 U.S.C. section 360bbb-3(b)(1), unless the authorization is terminated or revoked sooner.  Performed at [facility], [place] [address] [place], [place] [address]          Radiology Studies: No results found.      Scheduled Meds: . amLODipine  5 mg Oral Daily  . apixaban  10 mg Oral BID   Followed by  . [START ON 05/17/2020] apixaban  5 mg Oral BID  . cefdinir  300 mg Oral Q12H  . Chlorhexidine Gluconate Cloth  6 each Topical Daily  . gabapentin  800 mg Oral Q0600  . levETIRAcetam  500 mg Oral BID  . mouth rinse  15 mL Mouth Rinse BID  . nicotine  21 mg Transdermal Daily  . polyethylene glycol  17 g Oral Once  . senna-docusate  2 tablet Oral BID   Continuous Infusions:   LOS: 5 days    Time spent: 35 minutes spent on chart review, discussion with nursing staff, consultants, updating family and interview/physical exam; more than 50% of that time was spent in counseling and/or coordination of care.    Chidera Dearcos J [place] DO [facility] Available via Epic secure chat 7am-7pm After these  hours, please refer  to coverage provider listed on [email or web address] 05/14/2020, 11:25 AM

## 2020-05-15 DIAGNOSIS — L03314 Cellulitis of groin: Secondary | ICD-10-CM | POA: Diagnosis not present

## 2020-05-15 DIAGNOSIS — I2694 Multiple subsegmental pulmonary emboli without acute cor pulmonale: Secondary | ICD-10-CM | POA: Diagnosis not present

## 2020-05-15 DIAGNOSIS — N179 Acute kidney failure, unspecified: Secondary | ICD-10-CM | POA: Diagnosis not present

## 2020-05-15 DIAGNOSIS — G9341 Metabolic encephalopathy: Secondary | ICD-10-CM | POA: Diagnosis not present

## 2020-05-15 LAB — GLUCOSE, CAPILLARY
Glucose-Capillary: 102 mg/dL — ABNORMAL HIGH (ref 70–99)
Glucose-Capillary: 89 mg/dL (ref 70–99)
Glucose-Capillary: 84 mg/dL (ref 70–99)

## 2020-05-15 MED ORDER — MAGNESIUM CITRATE PO SOLN
1.0000 | Freq: Once | ORAL | Status: AC
  Administered 2020-05-15: 1 via ORAL
  Filled 2020-05-15: qty 296

## 2020-05-15 NOTE — Progress Notes (Signed)
PROGRESS NOTE    Brian [name]  GQQ:[id number] DOB: [birth year] DOA: 05/09/2020 PCP: [facility]    Brief Narrative:  Brian [name] is a 75 year old male with past medical history significant for CVA with resultant left-sided hemiparesis, seizure disorder, SDH s/p bur hole February 2021, brain aneurysm, active cocaine abuse, tobacco abuse who presented to the ED with multiple falls shortness of breath, and somnolence.  Upon EMS arrival, he was noted to be hypotensive with a blood pressure of 71/48, some concern at home that he overmedicated himself with pain medications with tramadol and gabapentin.  In the ED, patient was noted to be confused, hypotensive, hypoxic, and was further resuscitated with 2 L normal saline.  Labs significant for potassium 5.2, bicarb 18, glucose 134, BUN 20, creatinine 2, albumin 2.5, AST 60, WBC 11.6, hemoglobin 12.1, lactic acid 6.5.  High-sensitivity troponin [id number].  EKG with significant ST depression which resolved once patient was oxygenated.  CT head negative for acute process.  CTA PE showing acute PE, diffuse right lung lobar and segmental involvement and lesser segmental involvement in the left lung.  Patient was started on heparin infusion.  PCCM was called for admission.  Patient transferred from [facility] to [facility] on 05/11/2020   Assessment & Plan:   Principal Problem:   Pulmonary emboli [facility]) Active Problems:   Renal failure, acute (HCC)   Pressure injury of skin   Acute metabolic encephalopathy   Cellulitis   Acute metabolic encephalopathy, POA Cocaine abuse Hx CVA with chronic left-sided deficits Hx SAH/SDH 03/2019 Questionable seizure disorder Patient presented with confusion, notably hypotensive and hypoxic.  CT head/cervical spine negative for acute process.  Ammonia level 18, TSH 1.142.  EEG within normal limits.  Etiology likely secondary to continued substance abuse as well as pulmonary embolism. --Mental  status now back at baseline --Keppra 500 mg p.o. twice daily  Acute lobar and segmental pulmonary embolism Acute hypoxic respiratory failure, POA Presentation, patient noted to be hypoxic.  CT angiogram chest notable for bilateral acute pulmonary emboli with diffuse right lung lobar and segmental involvement, lesser segmental involvement in the left lung.  TTE with LVEF 65-70%, normal LV function, no regional wall motion normalities, concentric LVH, grade 1 diastolic dysfunction, RA mildly dilated, RV moderately dilated with moderate systolic dysfunction exaggerated apical motion suspicious for a McConnell sign and pulmonary hypertension.  Patient was initially started on heparin drip and transition to apixaban.  Initially requiring supple oxygen, now titrated off. --Continue apixaban  Left flank and groin cellulitis Procalcitonin elevated on admission.  Blood cultures negative.  Completed 2 days of cefepime, now transition to [facility] to complete 7-day course; to complete on 05/16/2020.  Elevated troponin 2/2 type II demand ischemia High-sensitivity troponin noted to be elevated on admission, [phone].  Etiology likely secondary to right heart strain from acute PE as above versus active cocaine abuse.  The with preserved LVEF with no regional wall motion abnormalities.  Above, counseled on need for cocaine cessation.  Acute renal failure: Resolved Creatinine elevated 2.00 on admission, likely prerenal azotemia from dehydration.  Baseline creatinine 1.00.  Now resolved following IV fluid hydration.  Essential hypertension BP 135/71 this morning, well controlled. --Amlodipine 5 mg p.o. daily --Avoid beta-blockers in the setting of active cocaine use  Chronic pain --Percocet as needed --Gabapentin 800 mg p.o. daily  Polysubstance abuse, cocaine use Positive for cocaine on admission.  Also history of previous THC abuse.  Patient counseled extensively on need for total  abstinence of illicit  substances given his history of SAH.  Patient seems to be receptive and wants to quit.  Continue to encourage complete cessation   DVT prophylaxis: Eliquis   Code Status: Full Code Family Communication: No family present at bedside this morning  Disposition Plan:  Level of care: Telemetry Medical Status is: Inpatient  Remains inpatient appropriate because:Unsafe d/c plan   Dispo:  Patient From: Home  Planned Disposition: [facility], pending insurance authorization  Medically stable for discharge: Yes      Consultants:   PCCM  Procedures:   EEG  Antimicrobials:   Cefepime 3/19 - 3/20  Vancomycin 3/19-3 20  Omnicef 3/21>>  Cefazolin 3/20-3/21   Subjective: Patient seen and examined at bedside, resting comfortably.  No specific complaints this morning. Denies headache, no chest pain, no palpitations, no shortness of breath, no abdominal pain.  No acute events overnight per nursing staff.  Awaiting insurance authorization for SNF placement.  Objective: Vitals:   05/14/20 2128 05/15/20 0500 05/15/20 0530 05/15/20 0748  BP: (!) 126/91  135/71 110/77  Pulse: 96  95 96  Resp: 20  17 18   Temp: 98.5 F (36.9 C)  98 F (36.7 C)   TempSrc: Oral  Oral   SpO2: 98%  99% 93%  Weight:  67.9 kg    Height:        Intake/Output Summary (Last 24 hours) at 05/15/2020 1122 Last data filed at 05/15/2020 1000 Gross per 24 hour  Intake --  Output 300 ml  Net -300 ml   Filed Weights   05/12/20 0500 05/13/20 0500 05/15/20 0500  Weight: 69.1 kg 69.2 kg 67.9 kg    Examination:  General exam: Appears calm and comfortable, chronically ill and debilitated in appearance Respiratory system: Clear to auscultation. Respiratory effort normal. Cardiovascular system: S1 & S2 heard, RRR. No JVD, murmurs, rubs, gallops or clicks. No pedal edema. Gastrointestinal system: Abdomen is nondistended, soft and nontender. No organomegaly or masses felt. Normal  bowel sounds heard. Central nervous system: Alert and oriented. No focal neurological deficits. Extremities: Symmetric 5 x 5 power. Skin: No rashes, lesions or ulcers Psychiatry: Judgement and insight appear poor. Mood & affect appropriate.     Data Reviewed: I have personally reviewed following labs and imaging studies  CBC: Recent Labs  Lab 05/09/20 0448 05/09/20 0506 05/09/20 0920 05/09/20 1108 05/10/20 0234 05/11/20 0252  WBC 11.6*  --   --   --  8.4 7.6  NEUTROABS 7.8*  --   --   --   --   --   HGB 12.1* 12.6* 11.9* 11.6* 10.9* 11.4*  HCT 38.5* 37.0* 38.2* 34.0* 33.5* 34.9*  MCV 91.0  --   --   --  88.2 86.2  PLT 171  --   --   --  144* 341   Basic Metabolic Panel: Recent Labs  Lab 05/09/20 0448 05/09/20 0506 05/09/20 0920 05/09/20 1108 05/10/20 0234  NA 136 139  --  143 138  K 5.2* 4.9  --  4.3 3.9  CL 103 107  --   --  111  CO2 18*  --   --   --  20*  GLUCOSE 134* 134*  --   --  85  BUN 30* 44*  --   --  16  CREATININE 2.00* 1.90*  --   --  1.00  CALCIUM 8.3*  --   --   --  8.1*  MG  --   --  1.9  --   --    GFR: Estimated Creatinine Clearance: 61.5 mL/min (by C-G formula based on SCr of 1 mg/dL). Liver Function Tests: Recent Labs  Lab 05/09/20 0448 05/10/20 0234  AST 60* 30  ALT 31 24  ALKPHOS 58 52  BILITOT 2.0* 0.6  PROT 7.3 6.8  ALBUMIN 2.5* 2.3*   Recent Labs  Lab 05/09/20 0448  LIPASE 23   Recent Labs  Lab 05/09/20 0923  AMMONIA 18   Coagulation Profile: Recent Labs  Lab 05/09/20 0920  INR 1.5*   Cardiac Enzymes: No results for input(s): CKTOTAL, CKMB, CKMBINDEX, TROPONINI in the last 168 hours. BNP (last 3 results) No results for input(s): PROBNP in the last 8760 hours. HbA1C: No results for input(s): HGBA1C in the last 72 hours. CBG: Recent Labs  Lab 05/14/20 0715 05/14/20 1107 05/14/20 1459 05/14/20 2125 05/15/20 0749  GLUCAP 88 95 85 99 102*   Lipid Profile: No results for input(s): CHOL, HDL, LDLCALC, TRIG,  CHOLHDL, LDLDIRECT in the last 72 hours. Thyroid Function Tests: No results for input(s): TSH, T4TOTAL, FREET4, T3FREE, THYROIDAB in the last 72 hours. Anemia Panel: No results for input(s): VITAMINB12, FOLATE, FERRITIN, TIBC, IRON, RETICCTPCT in the last 72 hours. Sepsis Labs: Recent Labs  Lab 05/09/20 0447 05/09/20 0654 05/09/20 0920 05/10/20 0234 05/11/20 0252  PROCALCITON  --   --  2.84 4.47 2.16  LATICACIDVEN 6.5* 2.7*  --   --   --     Recent Results (from the past 240 hour(s))  SARS CORONAVIRUS 2 (TAT 6-24 HRS) Nasopharyngeal Nasopharyngeal Swab     Status: None   Collection Time: 05/09/20  4:55 AM   Specimen: Nasopharyngeal Swab  Result Value Ref Range Status   SARS Coronavirus 2 NEGATIVE NEGATIVE Final    Comment: (NOTE) SARS-CoV-2 target nucleic acids are NOT DETECTED.  The SARS-CoV-2 RNA is generally detectable in upper and lower respiratory specimens during the acute phase of infection. Negative results do not preclude SARS-CoV-2 infection, do not rule out co-infections with other pathogens, and should not be used as the sole basis for treatment or other patient management decisions. Negative results must be combined with clinical observations, patient history, and epidemiological information. The expected result is Negative.  Fact Sheet for Patients: SugarRoll.be  Fact Sheet for Healthcare Providers: https://www.woods-mathews.com/  This test is not yet approved or cleared by the [place] FDA and  has been authorized for detection and/or diagnosis of SARS-CoV-2 by FDA under an Emergency Use Authorization (EUA). This EUA will remain  in effect (meaning this test can be used) for the duration of the COVID-19 declaration under Se ction 564(b)(1) of the Act, 21 U.S.C. section 360bbb-3(b)(1), unless the authorization is terminated or revoked sooner.  Performed at [facility], [place] [address], [place],  [place] [address]   Culture, blood (routine x 2)     Status: None   Collection Time: 05/09/20  9:20 AM   Specimen: BLOOD  Result Value Ref Range Status   Specimen Description BLOOD RIGHT ANTECUBITAL  Final   Special Requests   Final    BOTTLES DRAWN AEROBIC ONLY Blood Culture results may not be optimal due to an inadequate volume of blood received in culture bottles   Culture   Final    NO GROWTH 5 DAYS Performed at [facility], [place] [address] [place],  [address]    Report Status 05/14/2020 FINAL  Final  Culture, blood (routine x 2)  Status: None   Collection Time: 05/09/20  9:20 AM   Specimen: BLOOD RIGHT HAND  Result Value Ref Range Status   Specimen Description BLOOD RIGHT HAND  Final   Special Requests   Final    BOTTLES DRAWN AEROBIC ONLY Blood Culture results may not be optimal due to an inadequate volume of blood received in culture bottles   Culture   Final    NO GROWTH 5 DAYS Performed at [facility], [place] [address], [place], [place] [address]    Report Status 05/14/2020 FINAL  Final  MRSA PCR Screening     Status: None   Collection Time: 05/09/20  6:02 PM   Specimen: Nasopharyngeal  Result Value Ref Range Status   MRSA by PCR NEGATIVE NEGATIVE Final    Comment:        The GeneXpert MRSA Assay (FDA approved for NASAL specimens only), is one component of a comprehensive MRSA colonization surveillance program. It is not intended to diagnose MRSA infection nor to guide or monitor treatment for MRSA infections. Performed at [facility], [place] [address], [place], [place] [address]   SARS CORONAVIRUS 2 (TAT 6-24 HRS) Nasopharyngeal Nasopharyngeal Swab     Status: None   Collection Time: 05/13/20  9:52 PM   Specimen: Nasopharyngeal Swab  Result Value Ref Range Status   SARS Coronavirus 2 NEGATIVE NEGATIVE Final    Comment: (NOTE) SARS-CoV-2 target nucleic acids are NOT DETECTED.  The SARS-CoV-2 RNA is generally detectable in upper and  lower respiratory specimens during the acute phase of infection. Negative results do not preclude SARS-CoV-2 infection, do not rule out co-infections with other pathogens, and should not be used as the sole basis for treatment or other patient management decisions. Negative results must be combined with clinical observations, patient history, and epidemiological information. The expected result is Negative.  Fact Sheet for Patients: SugarRoll.be  Fact Sheet for Healthcare Providers: https://www.woods-mathews.com/  This test is not yet approved or cleared by the [place] FDA and  has been authorized for detection and/or diagnosis of SARS-CoV-2 by FDA under an Emergency Use Authorization (EUA). This EUA will remain  in effect (meaning this test can be used) for the duration of the COVID-19 declaration under Se ction 564(b)(1) of the Act, 21 U.S.C. section 360bbb-3(b)(1), unless the authorization is terminated or revoked sooner.  Performed at [facility], [place] [address] [place], [place] [address]          Radiology Studies: No results found.      Scheduled Meds: . amLODipine  5 mg Oral Daily  . apixaban  10 mg Oral BID   Followed by  . [START ON 05/17/2020] apixaban  5 mg Oral BID  . cefdinir  300 mg Oral Q12H  . Chlorhexidine Gluconate Cloth  6 each Topical Daily  . gabapentin  800 mg Oral Q0600  . levETIRAcetam  500 mg Oral BID  . mouth rinse  15 mL Mouth Rinse BID  . nicotine  21 mg Transdermal Daily  . polyethylene glycol  17 g Oral Once  . senna-docusate  2 tablet Oral BID   Continuous Infusions:   LOS: 6 days    Time spent: 32 minutes spent on chart review, discussion with nursing staff, consultants, updating family and interview/physical exam; more than 50% of that time was spent in counseling and/or coordination of care.    Deny Chevez J [place] DO [facility] Available via Epic secure chat  7am-7pm After  these hours, please refer to coverage provider listed on [email or web address] 05/15/2020, 11:22 AM

## 2020-05-15 NOTE — Care Management Important Message (Signed)
Important Message  Patient Details  Name: Brian [name] MRN: [id number] Date of Birth: Apr 14, 1945   Medicare Important Message Given:  Yes     Orbie Pyo 05/15/2020, 3:12 PM

## 2020-05-15 NOTE — Progress Notes (Signed)
Occupational Therapy Treatment Patient Details Name: Brian [name] MRN: [id number] DOB: [birth year] Today's Date: 05/15/2020    History of present illness 75 year old gentleman admitted with multiple falls and somnolence.Pt  states he might have taken double dose of tramadol along with gabapentin. On arrival to ED, pt hypotensive with blood pressure of 71/48.  Resuscitated with fluid.  Found to have AKI, bilateral PE.   PMH:  active cocaine use, smoker, history of a stroke with left-sided hemiparesis, brain aneurysm on anticoagulation and bleeding on 1/21, spontaneous subdural hematoma on 2/21 status post burr hole and evacuation, seizure disorders   OT comments  Pt. Seen for skilled OT treatment.  Focus of session was grooming task completion which pt. Was able to perform ie: washing face with initial mod a transitioning to min with hob elevated for seated position.  Declined oob but agreeable to bed mobility and eob/repositioning in preparation for increasing mobility when pt. Able.  Mod a for initial bed mobility but only required min a for back to bed.    Some initial confusion at beginning of session.  When I stated I was Sonia Baller from the therapy department.  (occupational therapy).  Pt. Called his hh nurse/aide (tootie) and wanted me to explain to her that he was leaving to go to therapy today.  I explained the misunderstanding that we were located in the hospital and to my knowledge pt. Was not leaving to go anywhere today and that we would be doing his therapy session in the room.  Reviewed if he/she had any additional questions to contact his rn.  She verbalized understanding. I gave the phone back to the pt. And he ended the call.   Follow Up Recommendations  SNF    Equipment Recommendations  3 in 1 bedside commode    Recommendations for Other Services      Precautions / Restrictions Precautions Precautions: Fall       Mobility Bed Mobility Overal bed mobility: Needs  Assistance Bed Mobility: Rolling;Sidelying to Sit;Sit to Sidelying Rolling: Mod assist Sidelying to sit: Mod assist     Sit to sidelying: Min assist General bed mobility comments: agreeable to bed mobility for repositioning.  heavier asssit required for eob vs. getting back to bed. able to scoot x3 while seated eob towards hob prior to lying down.    Transfers                      Balance                                           ADL either performed or assessed with clinical judgement   ADL Overall ADL's : Needs assistance/impaired     Grooming: Minimal assistance;Sitting;Bed level Grooming Details (indicate cue type and reason): hob elevated to promote seated postion while completing grooming task                               General ADL Comments: Pt limited by decreased strength, decreased ability to care for self, decreased activity tolerance and decreased mobility. declined oob but was agreeable to bed mobility and sitting eob for repositioning in bed     Vision       Perception     Praxis      Cognition Arousal/Alertness: Lethargic Behavior During Therapy:  Flat affect Overall Cognitive Status: No family/caregiver present to determine baseline cognitive functioning                                          Exercises     Shoulder Instructions       General Comments      Pertinent Vitals/ Pain       Pain Assessment: No/denies pain  Home Living                                          Prior Functioning/Environment              Frequency  Min 2X/week        Progress Toward Goals  OT Goals(current goals can now be found in the care plan section)  Progress towards OT goals: Progressing toward goals     Plan      Co-evaluation                 AM-PAC OT "6 Clicks" Daily Activity     Outcome Measure   Help from another person eating meals?: A Little Help  from another person taking care of personal grooming?: A Little Help from another person toileting, which includes using toliet, bedpan, or urinal?: Total Help from another person bathing (including washing, rinsing, drying)?: A Lot Help from another person to put on and taking off regular upper body clothing?: A Lot Help from another person to put on and taking off regular lower body clothing?: Total 6 Click Score: 12    End of Session    OT Visit Diagnosis: Unsteadiness on feet (R26.81);Muscle weakness (generalized) (M62.81);Other symptoms and signs involving cognitive function   Activity Tolerance Patient tolerated treatment well;Patient limited by fatigue   Patient Left in bed;with call bell/phone within reach   Nurse Communication          Time: 6314-9702 OT Time Calculation (min): 12 min  Charges: OT General Charges $OT Visit: 1 Visit OT Treatments $Self Care/Home Management : 8-22 mins  Sonia Baller, COTA/L Acute Rehabilitation [phone]   Janice Coffin 05/15/2020, 11:27 AM

## 2020-05-15 NOTE — Progress Notes (Signed)
Physical Therapy Treatment Patient Details Name: Brian [name] MRN: [id number] DOB: 11/19/1945 Today's Date: 05/15/2020    History of Present Illness 75 year old gentleman admitted with multiple falls and somnolence.Pt  states he might have taken double dose of tramadol along with gabapentin. On arrival to ED, pt hypotensive with blood pressure of 71/48.  Resuscitated with fluid.  Found to have AKI, bilateral PE.   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 Pt would not come to eOB with max encouragement.  Pt did wash his face as he had some food in beard.  He also rolled and did some UE and LE exercises. Ultimately, he said "[birth year] my birthday and I can go home if I want to."  Nurse also came in and tried to persuade pt but not able to therefore left pt in bed.  Decr frequency to 2xweek as per standards of care for a SNF pt and pt refusing to work with PT last 2 visits.   Pt currently with functional limitations due to balacne and endurance deficits. Pt will benefit from skilled PT to increase their independence and safety with mobility to allow discharge to the venue listed below.     Follow Up Recommendations  SNF;Supervision/Assistance - 24 hour     Equipment Recommendations  None recommended by PT    Recommendations for Other Services       Precautions / Restrictions Precautions Precautions: Fall Restrictions Weight Bearing Restrictions: No    Mobility  Bed Mobility Overal bed mobility: Needs Assistance Bed Mobility: Rolling;Sidelying to Sit;Sit to Sidelying Rolling: Min assist Sidelying to sit: Mod assist     Sit to sidelying: Min assist General bed mobility comments: agreeable to bed mobility only    Transfers                  General transfer comment: refused EOB/OOB  Ambulation/Gait                 Stairs             Wheelchair Mobility    Modified Rankin (Stroke Patients Only)       Balance                                            Cognition Arousal/Alertness: Awake/alert Behavior During Therapy: Flat affect Overall Cognitive Status: No family/caregiver present to determine baseline cognitive functioning Area of Impairment: Memory;Following commands;Safety/judgement;Awareness;Problem solving                     Memory: Decreased short-term memory Following Commands: Follows one step commands with increased time;Follows one step commands inconsistently Safety/Judgement: Decreased awareness of safety;Decreased awareness of deficits   Problem Solving: Slow processing;Decreased initiation;Requires verbal cues;Difficulty sequencing General Comments: Pt states today is his birthday and he didnt want to get up.  Encouraged pt but ultimately, pt would not come to EOB.      Exercises General Exercises - Upper Extremity Shoulder Flexion: AROM;Both;5 reps;Supine Elbow Flexion: AROM;Both;5 reps;Supine General Exercises - Lower Extremity Ankle Circles/Pumps: AROM;Both;10 reps;Seated Heel Slides: AROM;Both;Supine (2 reps and said "no more") Hip Flexion/Marching: AROM;Both;5 reps;Supine  General Comments General comments (skin integrity, edema, etc.): Nurse came in and also tried to encourage pt to participate and pt declined.      Pertinent Vitals/Pain Pain Assessment: Faces Faces Pain Scale: Hurts even more Pain Location: generalized Pain Descriptors / Indicators: Grimacing;Moaning Pain Intervention(s): Limited activity within patient's tolerance;Monitored during session;Repositioned    Home Living                      Prior Function            PT Goals (current goals can now be found in the care plan section) Acute Rehab PT  Goals Patient Stated Goal: to get out of here Progress towards PT goals: Not progressing toward goals - comment (pt self limiting)    Frequency    Min 2X/week      PT Plan Frequency needs to be updated    Co-evaluation              AM-PAC PT "6 Clicks" Mobility   Outcome Measure  Help needed turning from your back to your side while in a flat bed without using bedrails?: A Lot Help needed moving from lying on your back to sitting on the side of a flat bed without using bedrails?: A Lot Help needed moving to and from a bed to a chair (including a wheelchair)?: Total Help needed standing up from a chair using your arms (e.g., wheelchair or bedside chair)?: Total Help needed to walk in hospital room?: Total Help needed climbing 3-5 steps with a railing? : Total 6 Click Score: 8    End of Session   Activity Tolerance: Treatment limited secondary to agitation (beginning to resist movements to reposition, pt self limiting) Patient left: in bed;with call bell/phone within reach;with bed alarm set;with nursing/sitter in room Nurse Communication: Other (comment) (pts refusal to do more than a little therapy at bed level) PT Visit Diagnosis: Unsteadiness on feet (R26.81);Muscle weakness (generalized) (M62.81)     Time: 1000-1018 PT Time Calculation (min) (ACUTE ONLY): 18 min  Charges:  $Therapeutic Exercise: 8-22 mins                     Kaisha Wachob M,PT Acute Rehab Services [phone] (pager)   Alvira Philips 05/15/2020, 2:40 PM

## 2020-05-16 DIAGNOSIS — A419 Sepsis, unspecified organism: Principal | ICD-10-CM

## 2020-05-16 DIAGNOSIS — Z9189 Other specified personal risk factors, not elsewhere classified: Secondary | ICD-10-CM

## 2020-05-16 DIAGNOSIS — I2609 Other pulmonary embolism with acute cor pulmonale: Secondary | ICD-10-CM | POA: Diagnosis not present

## 2020-05-16 DIAGNOSIS — G9341 Metabolic encephalopathy: Secondary | ICD-10-CM | POA: Diagnosis not present

## 2020-05-16 DIAGNOSIS — I5032 Chronic diastolic (congestive) heart failure: Secondary | ICD-10-CM

## 2020-05-16 DIAGNOSIS — I2694 Multiple subsegmental pulmonary emboli without acute cor pulmonale: Secondary | ICD-10-CM | POA: Diagnosis not present

## 2020-05-16 DIAGNOSIS — R7989 Other specified abnormal findings of blood chemistry: Secondary | ICD-10-CM

## 2020-05-16 DIAGNOSIS — F1410 Cocaine abuse, uncomplicated: Secondary | ICD-10-CM

## 2020-05-16 DIAGNOSIS — R6520 Severe sepsis without septic shock: Secondary | ICD-10-CM

## 2020-05-16 DIAGNOSIS — R5381 Other malaise: Secondary | ICD-10-CM

## 2020-05-16 DIAGNOSIS — R778 Other specified abnormalities of plasma proteins: Secondary | ICD-10-CM

## 2020-05-16 MED ORDER — OXYCODONE-ACETAMINOPHEN 5-325 MG PO TABS
1.0000 | ORAL_TABLET | Freq: Three times a day (TID) | ORAL | Status: DC | PRN
  Administered 2020-05-19 – 2020-05-25 (×5): 1 via ORAL
  Filled 2020-05-16 (×5): qty 1

## 2020-05-16 MED ORDER — GABAPENTIN 300 MG PO CAPS
300.0000 mg | ORAL_CAPSULE | Freq: Once | ORAL | Status: AC
  Administered 2020-05-17: 300 mg via ORAL
  Filled 2020-05-16: qty 1

## 2020-05-16 MED ORDER — GABAPENTIN 300 MG PO CAPS
600.0000 mg | ORAL_CAPSULE | Freq: Every day | ORAL | Status: DC
  Administered 2020-05-17: 600 mg via ORAL
  Filled 2020-05-16: qty 2

## 2020-05-16 NOTE — Progress Notes (Signed)
NA went into give pt bath, noted to be sleeping. Awoke pt for bath, Pt fell asleep during bath, Nurse called to room since pt was sleeping during bath. Pt noted to be lethargic called out pt. Name and pt never responded, sternal rub pt and he said "what", Nurse asked him why he was sleeping so hard and not waking up, pt said seizure med's and went back to sleep. BP92/76, p88, resp 20. MD made aware, see new orders.

## 2020-05-16 NOTE — Progress Notes (Signed)
PROGRESS NOTE  Brian [name] FXO:[id number] DOB: 12/27/1945   PCP: [facility]  Patient is from: Home  DOA: 05/09/2020 LOS: 7  Chief complaints: Fall, shortness of breath, altered mental status and hypotension  Brief Narrative / Interim history: 75 year old male with PMH of CVA with residual left hemiparesis, brain aneurysm/SDH status post bur hole in 03/2019, cocaine use, tobacco use disorder and seizure disorder brought to ED by EMS after multiple falls, shortness of breath and somnolence and admitted to ICU for acute hypoxic respiratory failure in the setting of acute PE, encephalopathy and AKI.  UDS positive for cocaine.  Patient was stabilized and transferred to hospitalist service on 05/11/2020. Patient transferred from [facility] to [facility] on 05/11/2020.  Therapy recommended SNF.  Subjective: Seen and examined earlier this morning.  No major events overnight of this morning.  No complaints.  He denies chest pain, dyspnea, GI or UTI symptoms.  Objective: Vitals:   05/16/20 0500 05/16/20 0557 05/16/20 1231 05/16/20 1303  BP:  (!) 133/100 98/76 92/76  Pulse:  88 81 88  Resp:  '18 16 20  ' Temp:  97.9 F (36.6 C) 98.8 F (37.1 C)   TempSrc:  Oral Oral   SpO2:  96% 92% 93%  Weight: 67.4 kg     Height:        Intake/Output Summary (Last 24 hours) at 05/16/2020 1317 Last data filed at 05/16/2020 0548 Gross per 24 hour  Intake 480 ml  Output -  Net 480 ml   Filed Weights   05/13/20 0500 05/15/20 0500 05/16/20 0500  Weight: 69.2 kg 67.9 kg 67.4 kg    Examination:  GENERAL: No apparent distress.  Nontoxic. HEENT: MMM.  Vision and hearing grossly intact.  NECK: Supple.  No apparent JVD.  RESP:  No IWOB.  Fair aeration bilaterally. CVS:  RRR. Heart sounds normal.  ABD/GI/GU: BS+. Abd soft, NTND.  MSK/EXT:  Moves extremities. No apparent deformity. No edema.  Overgrown nails. SKIN: no apparent skin lesion or wound NEURO: Awake.  Oriented x4.   Cranial nerves grossly intact.  Motor 4/5 in LUE and LLE. PSYCH: Calm. Normal affect.   Procedures:  None  Microbiology summarized: AYOKH-99 and influenza PCR nonreactive. Blood cultures NGTD.  Assessment & Plan: Acute metabolic encephalopathy: POA-multifactorial including polypharmacy, cocaine abuse, severe sepsis, and respiratory failure.  CTH, ammonia, TSH and EEG unrevealing.  Resolved. -Reorientation and delirium precautions -Minimize sedating medications. -Reduce gabapentin from 800 to 600 mg daily, and change to nightly.  Hx CVA with chronic left residual hemiparesis Hx SAH/SDH 03/2019 -Continue PT/OT  History of seizure disorder: Stable.  EEG without seizure. -Discontinue tramadol-increases risk of seizure -Continue home Keppra  Acute lobar and segmental pulmonary embolism with right heart strain: TTE with some evidence of right heart strain.  Also elevated troponin. Acute hypoxic respiratory failure, POA -Transitiond to p.o. Eliquis  Chronic diastolic CHF: TTE with LVEF of 65 to 70%, concentric LVH, G1 DD, moderate RV dilation and dysfunction and McConnell sign but no R WMA.  Not on diuretics.  Euvolemic.  Severe sepsis in the setting of left flank and groin cellulitis: POA.  Met criteria with tachycardia, tachypnea, leukocytosis, lactic acidosis, respiratory failure, AKI and elevated troponin: Resolved. -Completed antibiotic course with cefepime followed by [facility] on 3/26.  Elevated troponin: [phone].  Thought to be demand ischemia in the setting of severe sepsis, PE and cocaine. No R WMA on TTE.  Acute renal failure/azotemia: Resolved Recent Labs  05/09/20 0448 05/09/20 0506 05/10/20 0234  BUN 30* 44* 16  CREATININE 2.00* 1.90* 1.00   Essential hypertension: Normotensive -Continue amlodipine  Chronic pain -Reduce Percocet to 5/325 every 8 hours as needed -Reduce gabapentin as above  Polysubstance abuse, cocaine use: UDS positive for  cocaine -Counseled.  Body mass index is 23.27 kg/m.       Pressure Injury 05/10/20 Sacrum Left Stage 1 -  Intact skin with non-blanchable redness of a localized area usually over a bony prominence. (Active)  05/10/20 1500  Location: Sacrum  Location Orientation: Left  Staging: Stage 1 -  Intact skin with non-blanchable redness of a localized area usually over a bony prominence.  Wound Description (Comments):   Present on Admission: No   DVT prophylaxis:   apixaban (ELIQUIS) tablet 10 mg  apixaban (ELIQUIS) tablet 5 mg  Code Status: Full code Family Communication: Patient and/or RN. Available if any question.  Level of care: Telemetry Medical Status is: Inpatient  Remains inpatient appropriate because:Unsafe d/c plan   Dispo:  Patient From: Home  Planned Disposition: [place]  Medically stable for discharge: Yes         Consultants:  PCCM   Sch Meds:  Scheduled Meds: . amLODipine  5 mg Oral Daily  . apixaban  10 mg Oral BID   Followed by  . [START ON 05/17/2020] apixaban  5 mg Oral BID  . Chlorhexidine Gluconate Cloth  6 each Topical Daily  . [START ON 05/17/2020] gabapentin  300 mg Oral Once  . [START ON 05/17/2020] gabapentin  600 mg Oral QHS  . levETIRAcetam  500 mg Oral BID  . mouth rinse  15 mL Mouth Rinse BID  . nicotine  21 mg Transdermal Daily  . polyethylene glycol  17 g Oral Once  . senna-docusate  2 tablet Oral BID   Continuous Infusions: PRN Meds:.docusate sodium, hydrALAZINE, oxyCODONE-acetaminophen **AND** [DISCONTINUED] oxyCODONE, polyethylene glycol  Antimicrobials: Anti-infectives (From admission, onward)   Start     Dose/Rate Route Frequency Ordered Stop   05/11/20 1030  cefdinir (OMNICEF) capsule 300 mg        300 mg Oral Every 12 hours 05/11/20 0937 05/15/20 2100   05/10/20 1000  ceFAZolin (ANCEF) IVPB 2g/100 mL premix  Status:  Discontinued        2 g 200 mL/hr over 30 Minutes Intravenous Every 8 hours 05/10/20 0905  05/11/20 0937   05/10/20 0800  vancomycin (VANCOREADY) IVPB 750 mg/150 mL  Status:  Discontinued        750 mg 150 mL/hr over 60 Minutes Intravenous Every 24 hours 05/09/20 0833 05/10/20 0859   05/09/20 1000  ceFEPIme (MAXIPIME) 2 g in sodium chloride 0.9 % 100 mL IVPB  Status:  Discontinued        2 g 200 mL/hr over 30 Minutes Intravenous Every 12 hours 05/09/20 0833 05/10/20 0859   05/09/20 0845  vancomycin (VANCOREADY) IVPB 1500 mg/300 mL        1,500 mg 150 mL/hr over 120 Minutes Intravenous  Once 05/09/20 0833 05/09/20 1220       I have personally reviewed the following labs and images: CBC: Recent Labs  Lab 05/10/20 0234 05/11/20 0252  WBC 8.4 7.6  HGB 10.9* 11.4*  HCT 33.5* 34.9*  MCV 88.2 86.2  PLT 144* 159   BMP &GFR Recent Labs  Lab 05/10/20 0234  NA 138  K 3.9  CL 111  CO2 20*  GLUCOSE 85  BUN 16  CREATININE 1.00  CALCIUM 8.1*   Estimated Creatinine Clearance: 61.5 mL/min (by C-G formula based on SCr of 1 mg/dL). Liver & Pancreas: Recent Labs  Lab 05/10/20 0234  AST 30  ALT 24  ALKPHOS 52  BILITOT 0.6  PROT 6.8  ALBUMIN 2.3*   No results for input(s): LIPASE, AMYLASE in the last 168 hours. No results for input(s): AMMONIA in the last 168 hours. Diabetic: No results for input(s): HGBA1C in the last 72 hours. Recent Labs  Lab 05/14/20 1459 05/14/20 2125 05/15/20 0749 05/15/20 1153 05/15/20 1620  GLUCAP 85 99 102* 89 84   Cardiac Enzymes: No results for input(s): CKTOTAL, CKMB, CKMBINDEX, TROPONINI in the last 168 hours. No results for input(s): PROBNP in the last 8760 hours. Coagulation Profile: No results for input(s): INR, PROTIME in the last 168 hours. Thyroid Function Tests: No results for input(s): TSH, T4TOTAL, FREET4, T3FREE, THYROIDAB in the last 72 hours. Lipid Profile: No results for input(s): CHOL, HDL, LDLCALC, TRIG, CHOLHDL, LDLDIRECT in the last 72 hours. Anemia Panel: No results for input(s): VITAMINB12, FOLATE,  FERRITIN, TIBC, IRON, RETICCTPCT in the last 72 hours. Urine analysis:    Component Value Date/Time   COLORURINE YELLOW 05/09/2020 [place] 05/09/2020 0833   LABSPEC 1.019 05/09/2020 0833   PHURINE 5.0 05/09/2020 0833   GLUCOSEU NEGATIVE 05/09/2020 0833   HGBUR SMALL (A) 05/09/2020 0833   BILIRUBINUR NEGATIVE 05/09/2020 [place] 05/09/2020 0833   PROTEINUR NEGATIVE 05/09/2020 0833   UROBILINOGEN 0.2 05/20/2011 1327   NITRITE NEGATIVE 05/09/2020 0833   LEUKOCYTESUR NEGATIVE 05/09/2020 0833   Sepsis Labs: Invalid input(s): PROCALCITONIN, [place]  Microbiology: Recent Results (from the past 240 hour(s))  SARS CORONAVIRUS 2 (TAT 6-24 HRS) Nasopharyngeal Nasopharyngeal Swab     Status: None   Collection Time: 05/09/20  4:55 AM   Specimen: Nasopharyngeal Swab  Result Value Ref Range Status   SARS Coronavirus 2 NEGATIVE NEGATIVE Final    Comment: (NOTE) SARS-CoV-2 target nucleic acids are NOT DETECTED.  The SARS-CoV-2 RNA is generally detectable in upper and lower respiratory specimens during the acute phase of infection. Negative results do not preclude SARS-CoV-2 infection, do not rule out co-infections with other pathogens, and should not be used as the sole basis for treatment or other patient management decisions. Negative results must be combined with clinical observations, patient history, and epidemiological information. The expected result is Negative.  Fact Sheet for Patients: SugarRoll.be  Fact Sheet for Healthcare Providers: https://www.woods-mathews.com/  This test is not yet approved or cleared by the [place] FDA and  has been authorized for detection and/or diagnosis of SARS-CoV-2 by FDA under an Emergency Use Authorization (EUA). This EUA will remain  in effect (meaning this test can be used) for the duration of the COVID-19 declaration under Se ction 564(b)(1) of the Act, 21  U.S.C. section 360bbb-3(b)(1), unless the authorization is terminated or revoked sooner.  Performed at [facility], [place] [address], [place], [place] [address]   Culture, blood (routine x 2)     Status: None   Collection Time: 05/09/20  9:20 AM   Specimen: BLOOD  Result Value Ref Range Status   Specimen Description BLOOD RIGHT ANTECUBITAL  Final   Special Requests   Final    BOTTLES DRAWN AEROBIC ONLY Blood Culture results may not be optimal due to an inadequate volume of blood received in culture bottles   Culture   Final    NO GROWTH 5 DAYS Performed at  [facility], [place] [address], [place], [place] [address]    Report Status 05/14/2020 FINAL  Final  Culture, blood (routine x 2)     Status: None   Collection Time: 05/09/20  9:20 AM   Specimen: BLOOD RIGHT HAND  Result Value Ref Range Status   Specimen Description BLOOD RIGHT HAND  Final   Special Requests   Final    BOTTLES DRAWN AEROBIC ONLY Blood Culture results may not be optimal due to an inadequate volume of blood received in culture bottles   Culture   Final    NO GROWTH 5 DAYS Performed at [facility], [place] [address] [place], [place] [address]    Report Status 05/14/2020 FINAL  Final  MRSA PCR Screening     Status: None   Collection Time: 05/09/20  6:02 PM   Specimen: Nasopharyngeal  Result Value Ref Range Status   MRSA by PCR NEGATIVE NEGATIVE Final    Comment:        The GeneXpert MRSA Assay (FDA approved for NASAL specimens only), is one component of a comprehensive MRSA colonization surveillance program. It is not intended to diagnose MRSA infection nor to guide or monitor treatment for MRSA infections. Performed at [facility], [place] [address], [place], [place] [address]   SARS CORONAVIRUS 2 (TAT 6-24 HRS) Nasopharyngeal Nasopharyngeal Swab     Status: None   Collection Time: 05/13/20  9:52 PM   Specimen: Nasopharyngeal Swab  Result Value Ref Range Status   SARS Coronavirus 2  NEGATIVE NEGATIVE Final    Comment: (NOTE) SARS-CoV-2 target nucleic acids are NOT DETECTED.  The SARS-CoV-2 RNA is generally detectable in upper and lower respiratory specimens during the acute phase of infection. Negative results do not preclude SARS-CoV-2 infection, do not rule out co-infections with other pathogens, and should not be used as the sole basis for treatment or other patient management decisions. Negative results must be combined with clinical observations, patient history, and epidemiological information. The expected result is Negative.  Fact Sheet for Patients: SugarRoll.be  Fact Sheet for Healthcare Providers: https://www.woods-mathews.com/  This test is not yet approved or cleared by the [place] FDA and  has been authorized for detection and/or diagnosis of SARS-CoV-2 by FDA under an Emergency Use Authorization (EUA). This EUA will remain  in effect (meaning this test can be used) for the duration of the COVID-19 declaration under Se ction 564(b)(1) of the Act, 21 U.S.C. section 360bbb-3(b)(1), unless the authorization is terminated or revoked sooner.  Performed at [facility], [place] [address] [place], [place] [address]     Radiology Studies: No results found.    Taye T. Diamond  If 7PM-7AM, please contact night-coverage [email or web address] 05/16/2020, 1:17 PM

## 2020-05-16 NOTE — Plan of Care (Signed)

## 2020-05-17 DIAGNOSIS — G9341 Metabolic encephalopathy: Secondary | ICD-10-CM | POA: Diagnosis not present

## 2020-05-17 DIAGNOSIS — I2609 Other pulmonary embolism with acute cor pulmonale: Secondary | ICD-10-CM | POA: Diagnosis not present

## 2020-05-17 DIAGNOSIS — I2694 Multiple subsegmental pulmonary emboli without acute cor pulmonale: Secondary | ICD-10-CM | POA: Diagnosis not present

## 2020-05-17 DIAGNOSIS — A419 Sepsis, unspecified organism: Secondary | ICD-10-CM | POA: Diagnosis not present

## 2020-05-17 NOTE — Progress Notes (Signed)
PROGRESS NOTE  Brian [name] FTD:[id number] DOB: [birth year]   PCP: [facility]  Patient is from: Home  DOA: 05/09/2020 LOS: 8  Chief complaints: Fall, shortness of breath, altered mental status and hypotension  Brief Narrative / Interim history: 75 year old male with PMH of CVA with residual left hemiparesis, brain aneurysm/SDH status post bur hole in 03/2019, cocaine use, tobacco use disorder and seizure disorder brought to ED by EMS after multiple falls, shortness of breath and somnolence and admitted to ICU for acute hypoxic respiratory failure in the setting of acute PE, encephalopathy and AKI.  UDS positive for cocaine.  Patient was stabilized and transferred to hospitalist service on 05/11/2020. Patient transferred from [facility] to [facility] on 05/11/2020.  Therapy recommended SNF.  Waiting on insurance authorization.  Subjective: Seen and examined earlier this morning.  No major events overnight of this morning.  He was a sleepy but awakes to voice easily.  He says he is tired of waiting on rehab bed and likes to go home.  He says he has "life" at home.  He is oriented x4 and has fair insight into his situation.  He lives alone. When I told him it is not a safe discharge without someone at home to supervise 24/7, he says " I have an aide who can help me". He says his aide can come and pick him up.   Objective: Vitals:   05/16/20 0557 05/16/20 1231 05/16/20 1303 05/16/20 2000  BP: (!) 133/100 98/76 92/76 107/80  Pulse: 88 81 88 94  Resp: _0 Temp: 97.9 F (36.6 C) 98.8 F (37.1 C)  (!) 97.4 F (36.3 C)  TempSrc: Oral Oral  Oral  SpO2: 96% 92% 93% 100%  Weight:      Height:        Intake/Output Summary (Last 24 hours) at 05/17/2020 1131 Last data filed at 05/16/2020 1748 Gross per 24 hour  Intake -  Output 200 ml  Net -200 ml   Filed Weights   05/13/20 0500 05/15/20 0500 05/16/20 0500  Weight: 69.2 kg 67.9 kg 67.4 kg     Examination:  GENERAL: No apparent distress.  Nontoxic. HEENT: MMM.  Vision and hearing grossly intact.  NECK: Supple.  No apparent JVD.  RESP: On RA.  No IWOB.  Fair aeration bilaterally. CVS:  RRR. Heart sounds normal.  ABD/GI/GU: BS+. Abd soft, NTND.  MSK/EXT:  Moves extremities. No apparent deformity. No edema.  SKIN: no apparent skin lesion or wound NEURO: Sleepy but wakes to voice easily.  Oriented x4 including situation.  Motor 4/5 in LUE and LLE. PSYCH: Calm. Normal affect.   Procedures:  None  Microbiology summarized: [id number] and influenza PCR nonreactive. Blood cultures NGTD.  Assessment & Plan: Acute metabolic encephalopathy: POA-multifactorial including polypharmacy, cocaine abuse, severe sepsis, and respiratory failure.  CTH, ammonia, TSH and EEG unrevealing.  Resolved. -Reorientation and delirium precautions -Minimize sedating medications. -Reduced gabapentin from 800 daily to 600 mg nightly.  Hx CVA with chronic left residual hemiparesis Hx SAH/SDH 03/2019 -Continue PT/OT-recommended SNF.  History of seizure disorder: Stable.  EEG without seizure. -Discontinue tramadol-increases risk of seizure -Continue home Keppra  Acute lobar and segmental pulmonary embolism with right heart strain: TTE with some evidence of right heart strain.  Also elevated troponin. Acute hypoxic respiratory failure, POA -Transitiond to p.o. Eliquis  Chronic diastolic CHF: TTE with LVEF of 65 to 70%, concentric LVH, G1 DD, moderate RV dilation and dysfunction and McConnell  sign but no R WMA.  Not on diuretics.  Euvolemic.  Severe sepsis in the setting of left flank and groin cellulitis: POA.  Met criteria with tachycardia, tachypnea, leukocytosis, lactic acidosis, respiratory failure, AKI and elevated troponin: Resolved. -Completed antibiotic course with cefepime followed by [facility] on 3/26.  Elevated troponin: [phone].  Thought to be demand ischemia in the setting of  severe sepsis, PE and cocaine. No R WMA on TTE.  No chest pain.  Acute renal failure/azotemia: Resolved Recent Labs    05/09/20 0448 05/09/20 0506 05/10/20 0234  BUN 30* 44* 16  CREATININE 2.00* 1.90* 1.00   Essential hypertension: Normotensive -Continue amlodipine  Chronic pain syndrome -Reduced Percocet to 5/325 every 8 hours as needed -Reduced gabapentin as above  Polysubstance abuse, cocaine use: UDS positive for cocaine -Counseled.  Disposition: Therapy recommended SNF but he now contemplating about going home.  He lives alone.  He is very weak and not safe to be alone at home to be discharged safely.  If he lives, it would be [place].  He is fully oriented with fair insight into his situation.  I have discussed with him the risk. He has capacity to make medical decision.  TOC working on SNF placement.  Body mass index is 23.27 kg/m.       Pressure Injury 05/10/20 Sacrum Left Stage 1 -  Intact skin with non-blanchable redness of a localized area usually over a bony prominence. (Active)  05/10/20 1500  Location: Sacrum  Location Orientation: Left  Staging: Stage 1 -  Intact skin with non-blanchable redness of a localized area usually over a bony prominence.  Wound Description (Comments):   Present on Admission: No   DVT prophylaxis:   apixaban (ELIQUIS) tablet 5 mg  Code Status: Full code Family Communication: Patient and/or RN. Available if any question.  Level of care: Telemetry Medical Status is: Inpatient  Remains inpatient appropriate because:Unsafe d/c plan   Dispo:  Patient From: Home  Planned Disposition: [place]  Medically stable for discharge: Yes         Consultants:  PCCM   Sch Meds:  Scheduled Meds: . amLODipine  5 mg Oral Daily  . apixaban  5 mg Oral BID  . Chlorhexidine Gluconate Cloth  6 each Topical Daily  . gabapentin  600 mg Oral QHS  . levETIRAcetam  500 mg Oral BID  . mouth rinse  15 mL  Mouth Rinse BID  . nicotine  21 mg Transdermal Daily  . polyethylene glycol  17 g Oral Once  . senna-docusate  2 tablet Oral BID   Continuous Infusions: PRN Meds:.docusate sodium, hydrALAZINE, oxyCODONE-acetaminophen **AND** [DISCONTINUED] oxyCODONE, polyethylene glycol  Antimicrobials: Anti-infectives (From admission, onward)   Start     Dose/Rate Route Frequency Ordered Stop   05/11/20 1030  cefdinir (OMNICEF) capsule 300 mg        300 mg Oral Every 12 hours 05/11/20 0937 05/15/20 2100   05/10/20 1000  ceFAZolin (ANCEF) IVPB 2g/100 mL premix  Status:  Discontinued        2 g 200 mL/hr over 30 Minutes Intravenous Every 8 hours 05/10/20 0905 05/11/20 0937   05/10/20 0800  vancomycin (VANCOREADY) IVPB 750 mg/150 mL  Status:  Discontinued        750 mg 150 mL/hr over 60 Minutes Intravenous Every 24 hours 05/09/20 0833 05/10/20 0859   05/09/20 1000  ceFEPIme (MAXIPIME) 2 g in sodium chloride 0.9 % 100 mL IVPB  Status:  Discontinued  2 g 200 mL/hr over 30 Minutes Intravenous Every 12 hours 05/09/20 0833 05/10/20 0859   05/09/20 0845  vancomycin (VANCOREADY) IVPB 1500 mg/300 mL        1,500 mg 150 mL/hr over 120 Minutes Intravenous  Once 05/09/20 0833 05/09/20 1220       I have personally reviewed the following labs and images: CBC: Recent Labs  Lab 05/11/20 0252  WBC 7.6  HGB 11.4*  HCT 34.9*  MCV 86.2  PLT 159   BMP &GFR No results for input(s): NA, K, CL, CO2, GLUCOSE, BUN, CREATININE, CALCIUM, MG, PHOS in the last 168 hours.  Invalid input(s):  GFRCG Estimated Creatinine Clearance: 61.5 mL/min (by C-G formula based on SCr of 1 mg/dL). Liver & Pancreas: No results for input(s): AST, ALT, ALKPHOS, BILITOT, PROT, ALBUMIN in the last 168 hours. No results for input(s): LIPASE, AMYLASE in the last 168 hours. No results for input(s): AMMONIA in the last 168 hours. Diabetic: No results for input(s): HGBA1C in the last 72 hours. Recent Labs  Lab 05/14/20 1459  05/14/20 2125 05/15/20 0749 05/15/20 1153 05/15/20 1620  GLUCAP 85 99 102* 89 84   Cardiac Enzymes: No results for input(s): CKTOTAL, CKMB, CKMBINDEX, TROPONINI in the last 168 hours. No results for input(s): PROBNP in the last 8760 hours. Coagulation Profile: No results for input(s): INR, PROTIME in the last 168 hours. Thyroid Function Tests: No results for input(s): TSH, T4TOTAL, FREET4, T3FREE, THYROIDAB in the last 72 hours. Lipid Profile: No results for input(s): CHOL, HDL, LDLCALC, TRIG, CHOLHDL, LDLDIRECT in the last 72 hours. Anemia Panel: No results for input(s): VITAMINB12, FOLATE, FERRITIN, TIBC, IRON, RETICCTPCT in the last 72 hours. Urine analysis:    Component Value Date/Time   COLORURINE YELLOW 05/09/2020 [place] 05/09/2020 0833   LABSPEC 1.019 05/09/2020 0833   PHURINE 5.0 05/09/2020 0833   GLUCOSEU NEGATIVE 05/09/2020 0833   HGBUR SMALL (A) 05/09/2020 0833   BILIRUBINUR NEGATIVE 05/09/2020 [place] 05/09/2020 0833   PROTEINUR NEGATIVE 05/09/2020 0833   UROBILINOGEN 0.2 05/20/2011 1327   NITRITE NEGATIVE 05/09/2020 0833   LEUKOCYTESUR NEGATIVE 05/09/2020 0833   Sepsis Labs: Invalid input(s): PROCALCITONIN, [place]  Microbiology: Recent Results (from the past 240 hour(s))  SARS CORONAVIRUS 2 (TAT 6-24 HRS) Nasopharyngeal Nasopharyngeal Swab     Status: None   Collection Time: 05/09/20  4:55 AM   Specimen: Nasopharyngeal Swab  Result Value Ref Range Status   SARS Coronavirus 2 NEGATIVE NEGATIVE Final    Comment: (NOTE) SARS-CoV-2 target nucleic acids are NOT DETECTED.  The SARS-CoV-2 RNA is generally detectable in upper and lower respiratory specimens during the acute phase of infection. Negative results do not preclude SARS-CoV-2 infection, do not rule out co-infections with other pathogens, and should not be used as the sole basis for treatment or other patient management decisions. Negative results must be  combined with clinical observations, patient history, and epidemiological information. The expected result is Negative.  Fact Sheet for Patients: SugarRoll.be  Fact Sheet for Healthcare Providers: https://www.woods-mathews.com/  This test is not yet approved or cleared by the [place] FDA and  has been authorized for detection and/or diagnosis of SARS-CoV-2 by FDA under an Emergency Use Authorization (EUA). This EUA will remain  in effect (meaning this test can be used) for the duration of the COVID-19 declaration under Se ction 564(b)(1) of the Act, 21 U.S.C. section 360bbb-3(b)(1), unless the authorization is terminated or revoked sooner.  Performed at [facility]  [facility], [place] [address] [place], [place] [address]   Culture, blood (routine x 2)     Status: None   Collection Time: 05/09/20  9:20 AM   Specimen: BLOOD  Result Value Ref Range Status   Specimen Description BLOOD RIGHT ANTECUBITAL  Final   Special Requests   Final    BOTTLES DRAWN AEROBIC ONLY Blood Culture results may not be optimal due to an inadequate volume of blood received in culture bottles   Culture   Final    NO GROWTH 5 DAYS Performed at [facility], [place] [address], [place], [place] [address]    Report Status 05/14/2020 FINAL  Final  Culture, blood (routine x 2)     Status: None   Collection Time: 05/09/20  9:20 AM   Specimen: BLOOD RIGHT HAND  Result Value Ref Range Status   Specimen Description BLOOD RIGHT HAND  Final   Special Requests   Final    BOTTLES DRAWN AEROBIC ONLY Blood Culture results may not be optimal due to an inadequate volume of blood received in culture bottles   Culture   Final    NO GROWTH 5 DAYS Performed at [facility], [place] [address], [place], [place] [address]    Report Status 05/14/2020 FINAL  Final  MRSA PCR Screening     Status: None   Collection Time: 05/09/20  6:02 PM   Specimen: Nasopharyngeal  Result Value  Ref Range Status   MRSA by PCR NEGATIVE NEGATIVE Final    Comment:        The GeneXpert MRSA Assay (FDA approved for NASAL specimens only), is one component of a comprehensive MRSA colonization surveillance program. It is not intended to diagnose MRSA infection nor to guide or monitor treatment for MRSA infections. Performed at [facility], [place] [address], [place], [place] [address]   SARS CORONAVIRUS 2 (TAT 6-24 HRS) Nasopharyngeal Nasopharyngeal Swab     Status: None   Collection Time: 05/13/20  9:52 PM   Specimen: Nasopharyngeal Swab  Result Value Ref Range Status   SARS Coronavirus 2 NEGATIVE NEGATIVE Final    Comment: (NOTE) SARS-CoV-2 target nucleic acids are NOT DETECTED.  The SARS-CoV-2 RNA is generally detectable in upper and lower respiratory specimens during the acute phase of infection. Negative results do not preclude SARS-CoV-2 infection, do not rule out co-infections with other pathogens, and should not be used as the sole basis for treatment or other patient management decisions. Negative results must be combined with clinical observations, patient history, and epidemiological information. The expected result is Negative.  Fact Sheet for Patients: SugarRoll.be  Fact Sheet for Healthcare Providers: https://www.woods-mathews.com/  This test is not yet approved or cleared by the [place] FDA and  has been authorized for detection and/or diagnosis of SARS-CoV-2 by FDA under an Emergency Use Authorization (EUA). This EUA will remain  in effect (meaning this test can be used) for the duration of the COVID-19 declaration under Se ction 564(b)(1) of the Act, 21 U.S.C. section 360bbb-3(b)(1), unless the authorization is terminated or revoked sooner.  Performed at [facility], [place] [address], [place], [place] [address]     Radiology Studies: No results found.    Taye T. [place]  If 7PM-7AM, please contact night-coverage [email or web address] 05/17/2020, 11:31 AM

## 2020-05-17 NOTE — TOC Progression Note (Signed)
Transition of Care [facility]) - Progression Note    Patient Details  Name: MAAHIR HORST MRN: [id number] Date of Birth: 01/29/1946  [facility] CM/SW [place], [place] Phone Number: 05/17/2020, 10:45 AM  Clinical Narrative:    CSW was advised by facility that no admissions person was available this weekend. Pt is still waiting on [employer] auth and it is hard to determine when that might be approved. CSW will hold off requesting a covid at this time due to uncertainty of DC time. SW will continue to follow for DC needs.        Expected Discharge Plan and Services           Expected Discharge Date: 05/16/20                                     Social Determinants of Health (SDOH) Interventions    Readmission Risk Interventions Readmission Risk Prevention Plan 04/03/2019 06/29/2018  Post Dischage Appt - Complete  Medication Screening - Complete  Transportation Screening Complete Complete  PCP or Specialist Appt within 5-7 Days Complete -  Home Care Screening Complete -  Medication Review (RN CM) Complete -  Some recent data might be hidden

## 2020-05-18 DIAGNOSIS — A419 Sepsis, unspecified organism: Secondary | ICD-10-CM | POA: Diagnosis not present

## 2020-05-18 DIAGNOSIS — G9341 Metabolic encephalopathy: Secondary | ICD-10-CM | POA: Diagnosis not present

## 2020-05-18 DIAGNOSIS — I2609 Other pulmonary embolism with acute cor pulmonale: Secondary | ICD-10-CM | POA: Diagnosis not present

## 2020-05-18 DIAGNOSIS — I2694 Multiple subsegmental pulmonary emboli without acute cor pulmonale: Secondary | ICD-10-CM | POA: Diagnosis not present

## 2020-05-18 MED ORDER — GABAPENTIN 400 MG PO CAPS
400.0000 mg | ORAL_CAPSULE | Freq: Every day | ORAL | Status: DC
Start: 2020-05-18 — End: 2020-06-01
  Administered 2020-05-18 – 2020-05-31 (×14): 400 mg via ORAL
  Filled 2020-05-18 (×14): qty 1

## 2020-05-18 NOTE — TOC Progression Note (Addendum)
Transition of Care [facility]) - Progression Note    Patient Details  Name: Brian [name] MRN: [id number] Date of Birth: 11/06/1945  [facility]) CM/SW Contact  Joanne Chars, LCSW Phone Number: 05/18/2020, 3:03 PM  Clinical Narrative:   CSW spoke with facility at 10am and again at 1445.  [employer] requested additional clinical information, which was sent, and there has still not been a decision on the auth.   1515: Update from [place]: she spoke with [employer], the request has been "escalated" but still no determination.           Expected Discharge Plan and Services           Expected Discharge Date: 05/16/20                                     Social Determinants of Health (SDOH) Interventions    Readmission Risk Interventions Readmission Risk Prevention Plan 04/03/2019 06/29/2018  Post Dischage Appt - Complete  Medication Screening - Complete  Transportation Screening Complete Complete  PCP or Specialist Appt within 5-7 Days Complete -  Home Care Screening Complete -  Medication Review (RN CM) Complete -  Some recent data might be hidden

## 2020-05-18 NOTE — Progress Notes (Signed)
PROGRESS NOTE  Brian [name] OEH:[id number] DOB: 08/11/1945   PCP: [facility]  Patient is from: Home  DOA: 05/09/2020 LOS: 9  Chief complaints: Fall, shortness of breath, altered mental status and hypotension  Brief Narrative / Interim history: 75 year old male with PMH of CVA with residual left hemiparesis, brain aneurysm/SDH status post bur hole in 03/2019, cocaine use, tobacco use disorder and seizure disorder brought to ED by EMS after multiple falls, shortness of breath and somnolence and admitted to ICU for acute hypoxic respiratory failure in the setting of acute PE, encephalopathy and AKI.  UDS positive for cocaine.  Patient was stabilized and transferred to hospitalist service on 05/11/2020. Patient transferred from [facility] to [facility] on 05/11/2020.  Therapy recommended SNF.  Waiting on insurance authorization.  Subjective: Seen and examined earlier this morning.  No major events overnight of this morning.  Somewhat sleepy but awakes to voice easily.  No complaints other than his frustration waiting on SNF bed.  Objective: Vitals:   05/17/20 1425 05/17/20 2115 05/18/20 0900 05/18/20 1359  BP: (!) 122/98 (!) 135/92 (!) 130/94 127/90  Pulse: 84 86 90 88  Resp: '14 16 14 18  ' Temp: 97.7 F (36.5 C) 98.5 F (36.9 C) 98.4 F (36.9 C) 98 F (36.7 C)  TempSrc:  Oral Oral   SpO2: 96% 96% 97% (!) 70%  Weight:      Height:        Intake/Output Summary (Last 24 hours) at 05/18/2020 1454 Last data filed at 05/18/2020 1021 Gross per 24 hour  Intake --  Output 250 ml  Net -250 ml   Filed Weights   05/13/20 0500 05/15/20 0500 05/16/20 0500  Weight: 69.2 kg 67.9 kg 67.4 kg    Examination:  GENERAL: No apparent distress.  Nontoxic. HEENT: MMM.  Vision and hearing grossly intact.  NECK: Supple.  No apparent JVD.  RESP: On RA.  No IWOB.  Fair aeration bilaterally. CVS:  RRR. Heart sounds normal.  ABD/GI/GU: BS+. Abd soft, NTND.  MSK/EXT:  Moves  extremities. No apparent deformity. No edema.  SKIN: no apparent skin lesion or wound NEURO: Sleepy but wakes to voice easily.  Oriented x4.  Motor 4/5 in LUE and LLE.  PSYCH: Calm. Normal affect.  Procedures:  None  Microbiology summarized: IBBCW-88 and influenza PCR nonreactive. Blood cultures NGTD.  Assessment & Plan: Acute metabolic encephalopathy: POA-multifactorial including polypharmacy, cocaine abuse, severe sepsis, and respiratory failure.  CTH, ammonia, TSH and EEG unrevealing.  Resolved. -Reorientation and delirium precautions -Minimize sedating medications. -Spaced out his Percocet previously. -Reduce gabapentin further to 400 mg nightly  Hx CVA with chronic left residual hemiparesis Hx SAH/SDH 03/2019 -Continue PT/OT-recommended SNF.  History of seizure disorder: Stable.  EEG without seizure. -Discontinue tramadol-increases risk of seizure -Continue home Keppra  Acute hypoxic respiratory failure: POA Acute lobar and segmental pulmonary embolism with right heart strain: TTE with some evidence of right heart strain.  Also elevated troponin. -Now on p.o. Eliquis  Chronic diastolic CHF: TTE with LVEF of 65 to 70%, concentric LVH, G1 DD, moderate RV dilation and dysfunction and McConnell sign but no R WMA.  Not on diuretics.  Euvolemic.  Severe sepsis in the setting of left flank and groin cellulitis: POA.  Met criteria with tachycardia, tachypnea, leukocytosis, lactic acidosis, respiratory failure, AKI and elevated troponin: Resolved. -Completed antibiotic course with cefepime followed by [facility] on 3/26.  Elevated troponin: [phone].  Thought to be demand ischemia in the setting of  severe sepsis, PE and cocaine. No R WMA on TTE.  No chest pain.  Acute renal failure/azotemia: Resolved Recent Labs    05/09/20 0448 05/09/20 0506 05/10/20 0234  BUN 30* 44* 16  CREATININE 2.00* 1.90* 1.00   Essential hypertension: Normotensive -Continue  amlodipine  Chronic pain syndrome -Adjusted Percocet and gabapentin as above  Polysubstance abuse, cocaine use: UDS positive for cocaine -Counseled.  Disposition:  3/27-therapy recommended SNF but he now contemplating about going home.  He lives alone.  He is very weak and not safe to be alone at home to be discharged safely.  If he lives, it would be [place].  He is fully oriented with fair insight into his situation.  I have discussed with him the risk. He has capacity to make medical decision.  TOC working on SNF placement.  Body mass index is 23.27 kg/m.       Pressure Injury 05/10/20 Sacrum Left Stage 1 -  Intact skin with non-blanchable redness of a localized area usually over a bony prominence. (Active)  05/10/20 1500  Location: Sacrum  Location Orientation: Left  Staging: Stage 1 -  Intact skin with non-blanchable redness of a localized area usually over a bony prominence.  Wound Description (Comments):   Present on Admission: No   DVT prophylaxis:   apixaban (ELIQUIS) tablet 5 mg  Code Status: Full code Family Communication: Patient and/or RN. Available if any question.  Level of care: Telemetry Medical Status is: Inpatient  Remains inpatient appropriate because:Unsafe d/c plan   Dispo:  Patient From: Home  Planned Disposition: [place]  Medically stable for discharge: Yes         Consultants:  PCCM-signed off   Sch Meds:  Scheduled Meds: . amLODipine  5 mg Oral Daily  . apixaban  5 mg Oral BID  . Chlorhexidine Gluconate Cloth  6 each Topical Daily  . gabapentin  600 mg Oral QHS  . levETIRAcetam  500 mg Oral BID  . mouth rinse  15 mL Mouth Rinse BID  . nicotine  21 mg Transdermal Daily  . polyethylene glycol  17 g Oral Once  . senna-docusate  2 tablet Oral BID   Continuous Infusions: PRN Meds:.docusate sodium, hydrALAZINE, oxyCODONE-acetaminophen **AND** [DISCONTINUED] oxyCODONE, polyethylene  glycol  Antimicrobials: Anti-infectives (From admission, onward)   Start     Dose/Rate Route Frequency Ordered Stop   05/11/20 1030  cefdinir (OMNICEF) capsule 300 mg        300 mg Oral Every 12 hours 05/11/20 0937 05/15/20 2100   05/10/20 1000  ceFAZolin (ANCEF) IVPB 2g/100 mL premix  Status:  Discontinued        2 g 200 mL/hr over 30 Minutes Intravenous Every 8 hours 05/10/20 0905 05/11/20 0937   05/10/20 0800  vancomycin (VANCOREADY) IVPB 750 mg/150 mL  Status:  Discontinued        750 mg 150 mL/hr over 60 Minutes Intravenous Every 24 hours 05/09/20 0833 05/10/20 0859   05/09/20 1000  ceFEPIme (MAXIPIME) 2 g in sodium chloride 0.9 % 100 mL IVPB  Status:  Discontinued        2 g 200 mL/hr over 30 Minutes Intravenous Every 12 hours 05/09/20 0833 05/10/20 0859   05/09/20 0845  vancomycin (VANCOREADY) IVPB 1500 mg/300 mL        1,500 mg 150 mL/hr over 120 Minutes Intravenous  Once 05/09/20 5726 05/09/20 1220       I have personally reviewed the following labs and images:  CBC: No results for input(s): WBC, NEUTROABS, HGB, HCT, MCV, PLT in the last 168 hours. BMP &GFR No results for input(s): NA, K, CL, CO2, GLUCOSE, BUN, CREATININE, CALCIUM, MG, PHOS in the last 168 hours.  Invalid input(s):  GFRCG Estimated Creatinine Clearance: 61.5 mL/min (by C-G formula based on SCr of 1 mg/dL). Liver & Pancreas: No results for input(s): AST, ALT, ALKPHOS, BILITOT, PROT, ALBUMIN in the last 168 hours. No results for input(s): LIPASE, AMYLASE in the last 168 hours. No results for input(s): AMMONIA in the last 168 hours. Diabetic: No results for input(s): HGBA1C in the last 72 hours. Recent Labs  Lab 05/14/20 1459 05/14/20 2125 05/15/20 0749 05/15/20 1153 05/15/20 1620  GLUCAP 85 99 102* 89 84   Cardiac Enzymes: No results for input(s): CKTOTAL, CKMB, CKMBINDEX, TROPONINI in the last 168 hours. No results for input(s): PROBNP in the last 8760 hours. Coagulation Profile: No results  for input(s): INR, PROTIME in the last 168 hours. Thyroid Function Tests: No results for input(s): TSH, T4TOTAL, FREET4, T3FREE, THYROIDAB in the last 72 hours. Lipid Profile: No results for input(s): CHOL, HDL, LDLCALC, TRIG, CHOLHDL, LDLDIRECT in the last 72 hours. Anemia Panel: No results for input(s): VITAMINB12, FOLATE, FERRITIN, TIBC, IRON, RETICCTPCT in the last 72 hours. Urine analysis:    Component Value Date/Time   COLORURINE YELLOW 05/09/2020 [place] 05/09/2020 0833   LABSPEC 1.019 05/09/2020 0833   PHURINE 5.0 05/09/2020 0833   GLUCOSEU NEGATIVE 05/09/2020 0833   HGBUR SMALL (A) 05/09/2020 0833   BILIRUBINUR NEGATIVE 05/09/2020 [place] 05/09/2020 0833   PROTEINUR NEGATIVE 05/09/2020 0833   UROBILINOGEN 0.2 05/20/2011 1327   NITRITE NEGATIVE 05/09/2020 0833   LEUKOCYTESUR NEGATIVE 05/09/2020 0833   Sepsis Labs: Invalid input(s): PROCALCITONIN, [place]  Microbiology: Recent Results (from the past 240 hour(s))  SARS CORONAVIRUS 2 (TAT 6-24 HRS) Nasopharyngeal Nasopharyngeal Swab     Status: None   Collection Time: 05/09/20  4:55 AM   Specimen: Nasopharyngeal Swab  Result Value Ref Range Status   SARS Coronavirus 2 NEGATIVE NEGATIVE Final    Comment: (NOTE) SARS-CoV-2 target nucleic acids are NOT DETECTED.  The SARS-CoV-2 RNA is generally detectable in upper and lower respiratory specimens during the acute phase of infection. Negative results do not preclude SARS-CoV-2 infection, do not rule out co-infections with other pathogens, and should not be used as the sole basis for treatment or other patient management decisions. Negative results must be combined with clinical observations, patient history, and epidemiological information. The expected result is Negative.  Fact Sheet for Patients: SugarRoll.be  Fact Sheet for Healthcare Providers: https://www.woods-mathews.com/  This  test is not yet approved or cleared by the [place] FDA and  has been authorized for detection and/or diagnosis of SARS-CoV-2 by FDA under an Emergency Use Authorization (EUA). This EUA will remain  in effect (meaning this test can be used) for the duration of the COVID-19 declaration under Se ction 564(b)(1) of the Act, 21 U.S.C. section 360bbb-3(b)(1), unless the authorization is terminated or revoked sooner.  Performed at [facility], [place] [address] [place], [place] [address]   Culture, blood (routine x 2)     Status: None   Collection Time: 05/09/20  9:20 AM   Specimen: BLOOD  Result Value Ref Range Status   Specimen Description BLOOD RIGHT ANTECUBITAL  Final   Special Requests   Final    BOTTLES DRAWN AEROBIC ONLY Blood Culture results may not be  optimal due to an inadequate volume of blood received in culture bottles   Culture   Final    NO GROWTH 5 DAYS Performed at [facility], [place] [address], [place], [place] [address]    Report Status 05/14/2020 FINAL  Final  Culture, blood (routine x 2)     Status: None   Collection Time: 05/09/20  9:20 AM   Specimen: BLOOD RIGHT HAND  Result Value Ref Range Status   Specimen Description BLOOD RIGHT HAND  Final   Special Requests   Final    BOTTLES DRAWN AEROBIC ONLY Blood Culture results may not be optimal due to an inadequate volume of blood received in culture bottles   Culture   Final    NO GROWTH 5 DAYS Performed at [facility], [place] [address] [place], [place] [address]    Report Status 05/14/2020 FINAL  Final  MRSA PCR Screening     Status: None   Collection Time: 05/09/20  6:02 PM   Specimen: Nasopharyngeal  Result Value Ref Range Status   MRSA by PCR NEGATIVE NEGATIVE Final    Comment:        The GeneXpert MRSA Assay (FDA approved for NASAL specimens only), is one component of a comprehensive MRSA colonization surveillance program. It is not intended to diagnose MRSA infection nor to guide  or monitor treatment for MRSA infections. Performed at [facility], [place] [address], [place], [place] [address]   SARS CORONAVIRUS 2 (TAT 6-24 HRS) Nasopharyngeal Nasopharyngeal Swab     Status: None   Collection Time: 05/13/20  9:52 PM   Specimen: Nasopharyngeal Swab  Result Value Ref Range Status   SARS Coronavirus 2 NEGATIVE NEGATIVE Final    Comment: (NOTE) SARS-CoV-2 target nucleic acids are NOT DETECTED.  The SARS-CoV-2 RNA is generally detectable in upper and lower respiratory specimens during the acute phase of infection. Negative results do not preclude SARS-CoV-2 infection, do not rule out co-infections with other pathogens, and should not be used as the sole basis for treatment or other patient management decisions. Negative results must be combined with clinical observations, patient history, and epidemiological information. The expected result is Negative.  Fact Sheet for Patients: SugarRoll.be  Fact Sheet for Healthcare Providers: https://www.woods-mathews.com/  This test is not yet approved or cleared by the [place] FDA and  has been authorized for detection and/or diagnosis of SARS-CoV-2 by FDA under an Emergency Use Authorization (EUA). This EUA will remain  in effect (meaning this test can be used) for the duration of the COVID-19 declaration under Se ction 564(b)(1) of the Act, 21 U.S.C. section 360bbb-3(b)(1), unless the authorization is terminated or revoked sooner.  Performed at [facility], [place] [address], [place], [place] [address]     Radiology Studies: No results found.    Kirk Basquez T. [place]  If 7PM-7AM, please contact night-coverage [email or web address] 05/18/2020, 2:54 PM

## 2020-05-19 DIAGNOSIS — L03314 Cellulitis of groin: Secondary | ICD-10-CM | POA: Diagnosis not present

## 2020-05-19 DIAGNOSIS — N179 Acute kidney failure, unspecified: Secondary | ICD-10-CM | POA: Diagnosis not present

## 2020-05-19 DIAGNOSIS — I2609 Other pulmonary embolism with acute cor pulmonale: Secondary | ICD-10-CM | POA: Diagnosis not present

## 2020-05-19 DIAGNOSIS — G9341 Metabolic encephalopathy: Secondary | ICD-10-CM | POA: Diagnosis not present

## 2020-05-19 LAB — GLUCOSE, CAPILLARY: Glucose-Capillary: 116 mg/dL — ABNORMAL HIGH (ref 70–99)

## 2020-05-19 NOTE — Progress Notes (Signed)
Physical Therapy Treatment Patient Details Name: Brian [name] MRN: [id number] DOB: Mar 26, 1945 Today's Date: 05/19/2020    History of Present Illness 75 year old gentleman admitted with multiple falls and somnolence.Pt  states he might have taken double dose of tramadol along with gabapentin. On arrival to ED, pt hypotensive with blood pressure of 71/48.  Resuscitated with fluid.  Found to have AKI, bilateral PE.   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 Pt was agreeable to PT today and was able to participate in transfer to chair. Refused exercises but was able to do a little more than previous session. Will continue to follow. Continues to need SNF.   Pt currently with functional limitations due to balance and endurance deficits. Pt will benefit from skilled PT to increase their independence and safety with mobility to allow discharge to the venue listed below.    Follow Up Recommendations  SNF;Supervision/Assistance - 24 hour     Equipment Recommendations  None recommended by PT    Recommendations for Other Services       Precautions / Restrictions Precautions Precautions: Fall Restrictions Weight Bearing Restrictions: No    Mobility  Bed Mobility Overal bed mobility: Needs Assistance Bed Mobility: Rolling;Sidelying to Sit;Sit to Sidelying Rolling: Min assist Sidelying to sit: Mod assist Supine to sit: Mod assist     General bed mobility comments: Pt did agree to OOB.  Needed assist to come to EOB.  Difficulty attaining upright sitting initially.    Transfers Overall transfer level: Needs assistance Equipment used: Rolling walker (2 wheeled) Transfers: Sit to/from [employer] Sit to Stand: Mod assist Stand  pivot transfers: Mod assist;+2 physical assistance       General transfer comment: Pt stood with mod assist to power up. Pt could not stand completely upright and had difficulty gripping RW with left hand. Pt able to stand and pivot with cues and assist to chair however needed assist to control descent as pt sitting too soon prior to getting legs against chair.  cues to scoot back in chair as well which pt could do.  Ambulation/Gait             General Gait Details: Pt refused   Stairs             Wheelchair Mobility    Modified Rankin (Stroke Patients Only)       Balance Overall balance assessment: Needs assistance Sitting-balance support: No upper extremity supported;Feet supported Sitting balance-Leahy Scale: Fair     Standing balance support: Bilateral upper extremity supported;During functional activity Standing balance-Leahy Scale: Poor Standing balance comment: BUE support on RW and external support                            Cognition Arousal/Alertness: Awake/alert Behavior During Therapy: Flat affect Overall Cognitive Status: No family/caregiver present to determine baseline cognitive functioning Area of Impairment: Memory;Following commands;Safety/judgement;Awareness;Problem solving                     Memory: Decreased short-term memory Following Commands: Follows one step commands with increased time;Follows one step commands inconsistently Safety/Judgement: Decreased awareness of safety;Decreased awareness of deficits   Problem Solving: Slow processing;Decreased initiation;Requires verbal cues;Difficulty sequencing  Exercises Other Exercises Other Exercises: Refused all exercise although this PT attempted to get pt to do some    General Comments General comments (skin integrity, edema, etc.): Set pt up for breakfast      Pertinent Vitals/Pain Pain Assessment: No/denies pain    Home Living                       Prior Function            PT Goals (current goals can now be found in the care plan section) Acute Rehab PT Goals Patient Stated Goal: to get out of here Progress towards PT goals: Progressing toward goals    Frequency    Min 2X/week      PT Plan Current plan remains appropriate    Co-evaluation              AM-PAC PT "6 Clicks" Mobility   Outcome Measure  Help needed turning from your back to your side while in a flat bed without using bedrails?: A Lot Help needed moving from lying on your back to sitting on the side of a flat bed without using bedrails?: A Lot Help needed moving to and from a bed to a chair (including a wheelchair)?: A Lot Help needed standing up from a chair using your arms (e.g., wheelchair or bedside chair)?: A Lot Help needed to walk in hospital room?: Total Help needed climbing 3-5 steps with a railing? : Total 6 Click Score: 10    End of Session Equipment Utilized During Treatment: Gait belt Activity Tolerance: Treatment limited secondary to agitation (beginning to resist movements to reposition, pt self limiting) Patient left: with call bell/phone within reach;in chair;with chair alarm set Nurse Communication: Mobility status PT Visit Diagnosis: Unsteadiness on feet (R26.81);Muscle weakness (generalized) (M62.81)     Time: 7253-6644 PT Time Calculation (min) (ACUTE ONLY): 21 min  Charges:  $Therapeutic Activity: 8-22 mins                     Chasen Mendell M,PT Acute Rehab Services [phone] (pager)   Alvira Philips 05/19/2020, 12:02 PM

## 2020-05-19 NOTE — Plan of Care (Signed)
  Problem: Education: Goal: Knowledge of General Education information will improve Description: Including pain rating scale, medication(s)/side effects and non-pharmacologic comfort measures Outcome: Progressing   Problem: Elimination: Goal: Will not experience complications related to bowel motility Outcome: Progressing   Problem: Safety: Goal: Ability to remain free from injury will improve Outcome: Progressing   Problem: Education: Goal: Expressions of having a comfortable level of knowledge regarding the disease process will increase Outcome: Progressing   Problem: Coping: Goal: Ability to adjust to condition or change in health will improve Outcome: Progressing   Problem: Safety: Goal: Verbalization of understanding the information provided will improve Outcome: Progressing

## 2020-05-19 NOTE — Progress Notes (Addendum)
PROGRESS NOTE    LOGON UTTECH  HRC:[id number] DOB: 07-15-1945 DOA: 05/09/2020 PCP: [facility]    Brief Narrative:  Mr. Burkhalter was admitted to the hospital with the working diagnosis of acute hypoxic respiratory failure due to bilateral pulmonary embolism, in the setting of left flank/groin cellulitis, complicated with severe sepsis, present on admission.  75 year old male past medical history for CVA with left-sided deficit, subdural hematoma, subarachnoid hemorrhage, hypertension, chronic pain syndrome, cocaine and tobacco abuse who presented with multiple falls.  Patient reported becoming acutely dyspneic over the last 24 hours prior to hospitalization, at home he sustained a mechanical fall, EMS came to his home and helped back into his chair.  Later same day EMS was called again, he was found hypotensive 71/48, he received 700 mL of saline intravenously and was transported to the hospital.  On route he became unresponsive, requiring bag valve mask ventilation.  At arrival to the ED his blood pressure was back up to 138/108, heart rate 110, temperature 97.9, respiratory rate 25, he was very poor hygiene, his lungs are clear to auscultation, heart S1-S2, present, rhythmic, his abdomen was soft, no lower extremity edema, left leg with erythema extending to the left groin, he was alert and oriented, decreased strength on the left side.  Sodium 136, potassium 5.2, chloride 103, bicarb 18, glucose 134, BUN 30, creatinine 2.0,, high sensitive troponin to [id number]. White count 11.6, hemoglobin 12.1, hematocrit 38.5, platelets 171. SARS COVID-19 negative. Urinalysis specific gravity 1.019, 0-5 white cells, 0-5 red cells. Toxicology positive for cocaine. Chest radiograph no infiltrates. 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  CT  chest positive for acute bilateral pulmonary emboli, diffuse right lung and segmental involvement.  Lesser involvement in the left lung.  Positive emphysema.  EKG 104 bpm, normal axis, normal intervals, sinus rhythm, ST depression lead II, lead III, aVF, V4-V6, no significant T wave changes.  Patient was admitted to the intensive care unit, patient was placed on anticoagulation with IV heparin and IV antibiotic therapy.  Further work-up with echocardiography showed reduced RV systolic function.  Due to history of intracranial bleed TPA was not given. Lower extremity Dopplers positive for deep vein thrombosis bilaterally.  During his hospitalization he developed encephalopathy, with supportive medical therapy and has improved.  Patient continued to be very weak and deconditioned, currently waiting to be transferred to a skilled nursing facility.  Assessment & Plan:   Principal Problem:   Pulmonary emboli (HCC) Active Problems:   Renal failure, acute (HCC)   Pressure injury of skin   Acute metabolic encephalopathy   Cellulitis   1. Acute bilateral pulmonary embolism, bilateral lower extremities DVT. oxymetry is 98% on room air, he is somnolent today, and did not complained of dyspnea or chest pain.  Blood pressure is 121/84 mmHg.  Continue anticoagulation with apixaban.   2. Acute metabolic encephalopathy/ in the setting of hx of CVA/ intracranial bleeding (SAH/SDA). Hx of seizures Patient is somnolent, opens his eyes to touch, but not following commands. Continue close neuro checks, aspiration precautions. On oxycodone and gabapentin.   EEG with no active seizures. Continue with keppra.   3. Left flank cellulitis, complicated with severe sepsis (end organ failure hypotension), present on admission. Patient has completed antibiotic therapy with good toleration.  No evident rash at the time of my examination today.  4. Chronic diastolic heart  failure/ HTN. No clinical signs of  exacerbation, elevated troponin likely demand ischemia in the setting of sepsis and pulmonary embolism, no ACS.  Continue blood pressure control with amlodipine.   5. AKI. Renal function has been improving, continue close monitoring.   6. Chronic pain syndrome. Hx of polysubstance abuse. Continue with analgesics and neuro checks. No signs of acute withdrawal.     Status is: Inpatient  Remains inpatient appropriate because:Unsafe d/c plan   Dispo:  Patient From: Home  Planned Disposition: [place]  Medically stable for discharge: Yes     DVT prophylaxis: apixaban   Code Status:   full  Family Communication:  No family at the bedside      Nutrition Status:           Skin Documentation: Pressure Injury 05/10/20 Sacrum Left Stage 1 -  Intact skin with non-blanchable redness of a localized area usually over a bony prominence. (Active)  05/10/20 1500  Location: Sacrum  Location Orientation: Left  Staging: Stage 1 -  Intact skin with non-blanchable redness of a localized area usually over a bony prominence.  Wound Description (Comments):   Present on Admission: No      Subjective: Patient is somnolent today at the time of my examination, opens his eyes but not following commands.   Objective: Vitals:   05/18/20 1359 05/18/20 2200 05/19/20 0500 05/19/20 1234  BP: 127/90 (!) 141/97  121/84  Pulse: 88 85  86  Resp: 18 18  16   Temp: 98 F (36.7 C) 98 F (36.7 C)  (!) 97.5 F (36.4 C)  TempSrc:  Oral    SpO2: 100% 98%  96%  Weight:   63.9 kg   Height:        Intake/Output Summary (Last 24 hours) at 05/19/2020 1449 Last data filed at 05/19/2020 0500 Gross per 24 hour  Intake 120 ml  Output 1000 ml  Net -880 ml   Filed Weights   05/15/20 0500 05/16/20 0500 05/19/20 0500  Weight: 67.9 kg 67.4 kg 63.9 kg    Examination:   General: Not in pain or dyspnea, deconditioned  Neurology: somnolent but easy to arouse, not following commands or  answering to simple questions,  E ENT: no pallor, no icterus, oral mucosa moist Cardiovascular: No JVD. S1-S2 present, rhythmic, no gallops, rubs, or murmurs. No lower extremity edema. Pulmonary: positive breath sounds bilaterally, adequate air movement, no wheezing, rhonchi or rales. Gastrointestinal. Abdomen soft and non tender Skin. No rashes Musculoskeletal: no joint deformities     Data Reviewed: I have personally reviewed following labs and imaging studies  CBC: No results for input(s): WBC, NEUTROABS, HGB, HCT, MCV, PLT in the last 168 hours. Basic Metabolic Panel: No results for input(s): NA, K, CL, CO2, GLUCOSE, BUN, CREATININE, CALCIUM, MG, PHOS in the last 168 hours. GFR: Estimated Creatinine Clearance: 59.5 mL/min (by C-G formula based on SCr of 1 mg/dL). Liver Function Tests: No results for input(s): AST, ALT, ALKPHOS, BILITOT, PROT, ALBUMIN in the last 168 hours. No results for input(s): LIPASE, AMYLASE in the last 168 hours. No results for input(s): AMMONIA in the last 168 hours. Coagulation Profile: No results for input(s): INR, PROTIME in the last 168 hours. Cardiac Enzymes: No results for input(s): CKTOTAL, CKMB, CKMBINDEX, TROPONINI in the last 168 hours. BNP (last 3 results) No results for input(s): PROBNP in the last 8760 hours. HbA1C: No results for input(s): HGBA1C in the last 72 hours. CBG: Recent Labs  Lab 05/14/20 1459  05/14/20 2125 05/15/20 0749 05/15/20 1153 05/15/20 1620  GLUCAP 85 99 102* 89 84   Lipid Profile: No results for input(s): CHOL, HDL, LDLCALC, TRIG, CHOLHDL, LDLDIRECT in the last 72 hours. Thyroid Function Tests: No results for input(s): TSH, T4TOTAL, FREET4, T3FREE, THYROIDAB in the last 72 hours. Anemia Panel: No results for input(s): VITAMINB12, FOLATE, FERRITIN, TIBC, IRON, RETICCTPCT in the last 72 hours.    Radiology Studies: I have reviewed all of the imaging during this hospital visit personally     Scheduled  Meds: . amLODipine  5 mg Oral Daily  . apixaban  5 mg Oral BID  . Chlorhexidine Gluconate Cloth  6 each Topical Daily  . gabapentin  400 mg Oral QHS  . levETIRAcetam  500 mg Oral BID  . mouth rinse  15 mL Mouth Rinse BID  . nicotine  21 mg Transdermal Daily  . polyethylene glycol  17 g Oral Once  . senna-docusate  2 tablet Oral BID   Continuous Infusions:   LOS: 10 days        Minetta Krisher Gerome Apley, MD

## 2020-05-19 NOTE — TOC Progression Note (Addendum)
Transition of Care [facility]) - Progression Note    Patient Details  Name: Brian [name] MRN: [id number] Date of Birth: [birth year]  [facility]) CM/SW Contact  Joanne Chars, LCSW Phone Number: 05/19/2020, 1:30 PM  Clinical Narrative: RN reports pt talking about "going home".  CSW spoke with pt regarding plan for DC to SNF, pt still in agreement with this plan but says he "needs to get out of the hospital."  CSW continues to communicate with [employer] who is attempting to get auth from [place].    1510:Per Helene Kelp, still no word from [facility].  She has sent updated clinicals several times.           Expected Discharge Plan and Services           Expected Discharge Date: 05/16/20                                     Social Determinants of Health (SDOH) Interventions    Readmission Risk Interventions Readmission Risk Prevention Plan 04/03/2019 06/29/2018  Post Dischage Appt - Complete  Medication Screening - Complete  Transportation Screening Complete Complete  PCP or Specialist Appt within 5-7 Days Complete -  Home Care Screening Complete -  Medication Review (RN CM) Complete -  Some recent data might be hidden

## 2020-05-20 DIAGNOSIS — G9341 Metabolic encephalopathy: Secondary | ICD-10-CM | POA: Diagnosis not present

## 2020-05-20 DIAGNOSIS — N179 Acute kidney failure, unspecified: Secondary | ICD-10-CM | POA: Diagnosis not present

## 2020-05-20 DIAGNOSIS — I2609 Other pulmonary embolism with acute cor pulmonale: Secondary | ICD-10-CM | POA: Diagnosis not present

## 2020-05-20 DIAGNOSIS — L03314 Cellulitis of groin: Secondary | ICD-10-CM | POA: Diagnosis not present

## 2020-05-20 LAB — SARS CORONAVIRUS 2 (TAT 6-24 HRS): SARS Coronavirus 2: NEGATIVE

## 2020-05-20 NOTE — Progress Notes (Signed)
PROGRESS NOTE    Brian [name]  PJK:[id number] DOB: [birth year] DOA: 05/09/2020 PCP: [facility]    Brief Narrative:  Brian [name] was admitted to the hospital with the working diagnosis of acute hypoxic respiratory failure due to bilateral pulmonary embolism, in the setting of left flank/groin cellulitis, complicated with severe sepsis, present on admission.  75 year old male past medical history for CVA with left-sided deficit, subdural hematoma, subarachnoid hemorrhage, hypertension, chronic pain syndrome, cocaine and tobacco abuse who presented with multiple falls.  Patient reported becoming acutely dyspneic over the last 24 hours prior to hospitalization, at home he sustained a mechanical fall, EMS came to his home and helped back into his chair.  Later same day EMS was called again, he was found hypotensive 71/48, he received 700 mL of saline intravenously and was transported to the hospital.  On route he became unresponsive, requiring bag valve mask ventilation.  At arrival to the ED his blood pressure was back up to 138/108, heart rate 110, temperature 97.9, respiratory rate 25, he was very poor hygiene, his lungs are clear to auscultation, heart S1-S2, present, rhythmic, his abdomen was soft, no lower extremity edema, left leg with erythema extending to the left groin, he was alert and oriented, decreased strength on the left side.  Sodium 136, potassium 5.2, chloride 103, bicarb 18, glucose 134, BUN 30, creatinine 2.0,, high sensitive troponin to 229 [id number] 0312. White count 11.6, hemoglobin 12.1, hematocrit 38.5, platelets 171. SARS COVID-19 negative. Urinalysis specific gravity 1.019, 0-5 white cells, 0-5 red cells. Toxicology positive for cocaine. Chest radiograph no infiltrates. 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  CT chest positive for acute bilateral pulmonary emboli, diffuse right lung and segmental involvement.  Lesser involvement in the left lung.  Positive emphysema.  EKG 104 bpm, normal axis, normal intervals, sinus rhythm, ST depression lead II, lead III, aVF, V4-V6, no significant T wave changes.  Patient was admitted to the intensive care unit, patient was placed on anticoagulation with IV heparin and IV antibiotic therapy.  Further work-up with echocardiography showed reduced RV systolic function.  Due to history of intracranial bleed TPA was not given. Lower extremity Dopplers positive for deep vein thrombosis bilaterally.  During his hospitalization he developed encephalopathy, with supportive medical therapy and has improved.  EEG with no seizures.   Patient continued to be very weak and deconditioned, currently waiting to be transferred to a skilled nursing facility.   Assessment & Plan:   Principal Problem:   Pulmonary emboli (HCC) Active Problems:   Renal failure, acute (HCC)   Pressure injury of skin   Acute metabolic encephalopathy   Cellulitis   1. Acute bilateral pulmonary embolism, bilateral lower extremities DVT. Patient continue hemodynamcaly stable and with good oxygenation (100% on room air), no chest pain or dyspnea, very weak and deconditioned.   Anticoagulation with apixaban with good toleration.   2. Acute metabolic encephalopathy/ in the setting of hx of CVA/ intracranial bleeding (SAH/SDA). Hx of seizures  He is more awake and alert today, following commands and answering to questions. Continue pain control with oxycodone and gabapentin.   On keppra with good toleration. .   3. Left flank cellulitis, complicated with severe sepsis (end organ failure hypotension), present on admission. Completed antibiotic therapy.  4. Chronic diastolic heart failure/ HTN. No acute exacerbation, elevated troponin likely demand ischemia in the setting  of sepsis and pulmonary embolism, no ACS.  On amlodipine for blood pressure control.   5. AKI. On 03/20 serum cr at 1,0. Patient tolerating po well.   6. Chronic pain syndrome. Hx of polysubstance abuse. No acute withdrawal symptoms. Continue pain control with oral analgesics.    7. Stage 1 sacrum pressure ulcer not present on admission. Continue local skin care.   Status is: Inpatient  Remains inpatient appropriate because:Unsafe d/c plan   Dispo:  Patient From: Home  Planned Disposition: [place]  Medically stable for discharge: Yes  I explained patient that currently waiting for insurance authorization in order to have a safe discharge.      DVT prophylaxis: apixaban   Code Status:   full  Family Communication:  No family at the bedside      Nutrition Status:           Skin Documentation: Pressure Injury 05/10/20 Sacrum Left Stage 1 -  Intact skin with non-blanchable redness of a localized area usually over a bony prominence. (Active)  05/10/20 1500  Location: Sacrum  Location Orientation: Left  Staging: Stage 1 -  Intact skin with non-blanchable redness of a localized area usually over a bony prominence.  Wound Description (Comments):   Present on Admission: No     Subjective: Patient is feeling very weak and deconditioned, no nausea or vomiting, no dyspnea or chest pain. He is willing to be discharged.   Objective: Vitals:   05/19/20 0500 05/19/20 1234 05/19/20 2127 05/20/20 0428  BP:  121/84 121/86 (!) 141/99  Pulse:  86 85 81  Resp:  16 18 20   Temp:  (!) 97.5 F (36.4 C) (!) 97.3 F (36.3 C) 97.6 F (36.4 C)  TempSrc:   Oral   SpO2:  96% 98% 100%  Weight: 63.9 kg     Height:       No intake or output data in the 24 hours ending 05/20/20 1256 Filed Weights   05/15/20 0500 05/16/20 0500 05/19/20 0500  Weight: 67.9 kg 67.4 kg 63.9 kg    Examination:   General: Not in pain or dyspnea, deconditioned  Neurology: Awake and  alert, non focal  E ENT: no pallor, no icterus, oral mucosa moist Cardiovascular: No JVD. S1-S2 present, rhythmic, no gallops, rubs, or murmurs. No lower extremity edema. Pulmonary: positive breath sounds bilaterally, adequate air movement, no wheezing, rhonchi or rales. Gastrointestinal. Abdomen soft and non tender Skin. No rashes Musculoskeletal: no joint deformities     Data Reviewed: I have personally reviewed following labs and imaging studies  CBC: No results for input(s): WBC, NEUTROABS, HGB, HCT, MCV, PLT in the last 168 hours. Basic Metabolic Panel: No results for input(s): NA, K, CL, CO2, GLUCOSE, BUN, CREATININE, CALCIUM, MG, PHOS in the last 168 hours. GFR: Estimated Creatinine Clearance: 59.5 mL/min (by C-G formula based on SCr of 1 mg/dL). Liver Function Tests: No results for input(s): AST, ALT, ALKPHOS, BILITOT, PROT, ALBUMIN in the last 168 hours. No results for input(s): LIPASE, AMYLASE in the last 168 hours. No results for input(s): AMMONIA in the last 168 hours. Coagulation Profile: No results for input(s): INR, PROTIME in the last 168 hours. Cardiac Enzymes: No results for input(s): CKTOTAL, CKMB, CKMBINDEX, TROPONINI in the last 168 hours. BNP (last 3 results) No results for input(s): PROBNP in the last 8760 hours. HbA1C: No results for input(s): HGBA1C in the last 72 hours. CBG: Recent Labs  Lab 05/14/20 2125 05/15/20 0749 05/15/20 1153 05/15/20 1620  05/19/20 2125  GLUCAP 99 102* 89 84 116*   Lipid Profile: No results for input(s): CHOL, HDL, LDLCALC, TRIG, CHOLHDL, LDLDIRECT in the last 72 hours. Thyroid Function Tests: No results for input(s): TSH, T4TOTAL, FREET4, T3FREE, THYROIDAB in the last 72 hours. Anemia Panel: No results for input(s): VITAMINB12, FOLATE, FERRITIN, TIBC, IRON, RETICCTPCT in the last 72 hours.    Radiology Studies: I have reviewed all of the imaging during this hospital visit personally     Scheduled Meds: .  amLODipine  5 mg Oral Daily  . apixaban  5 mg Oral BID  . Chlorhexidine Gluconate Cloth  6 each Topical Daily  . gabapentin  400 mg Oral QHS  . levETIRAcetam  500 mg Oral BID  . mouth rinse  15 mL Mouth Rinse BID  . nicotine  21 mg Transdermal Daily  . polyethylene glycol  17 g Oral Once  . senna-docusate  2 tablet Oral BID   Continuous Infusions:   LOS: 11 days        Annely Sliva Gerome Apley, MD

## 2020-05-20 NOTE — TOC Progression Note (Signed)
Transition of Care [facility]) - Progression Note    Patient Details  Name: Brian [name] MRN: [id number] Date of Birth: Oct 05, 1945  [facility]) CM/SW Contact  Joanne Chars, LCSW Phone Number: 05/20/2020, 4:15 PM  Clinical Narrative:   CSW received call from [facility] at [place] that SN[facility] has been denied by [employer].  She is trying to contact them regarding an appeal.  CSW spoke with pt regarding his home situation.  Pt lives alone in [place] but has a [facility] aide who comes by daily. Pt reports she is there 7 hours per day.  Pt son also lives nearby and pt reports son spends most evenings with him as well.  Pt reports son does not have working phone.  CSW spoke with daughter Denman George, informed her SNF [facility] had been denied.  She stays in [place], sees her father about once a month.  Father used to live with brother but he moved out.  Per Denman George, pt lives in a house with other people, she confirmed that the aide does come by daily.  She is not exactly sure how pt and her brother are getting along, there may have been some issues that led to pt moving out.  Brother does have working phone.  Discussed that we will need to work on a plan if SNF is not option.  Denman George is not available to provide 24/7 type support.  She will speak with her brother tonight.           Expected Discharge Plan and Services           Expected Discharge Date: 05/16/20                                     Social Determinants of Health (SDOH) Interventions    Readmission Risk Interventions Readmission Risk Prevention Plan 04/03/2019 06/29/2018  Post Dischage Appt - Complete  Medication Screening - Complete  Transportation Screening Complete Complete  PCP or Specialist Appt within 5-7 Days Complete -  Home Care Screening Complete -  Medication Review (RN CM) Complete -  Some recent data might be hidden

## 2020-05-21 DIAGNOSIS — N179 Acute kidney failure, unspecified: Secondary | ICD-10-CM | POA: Diagnosis not present

## 2020-05-21 DIAGNOSIS — G9341 Metabolic encephalopathy: Secondary | ICD-10-CM | POA: Diagnosis not present

## 2020-05-21 DIAGNOSIS — I2609 Other pulmonary embolism with acute cor pulmonale: Secondary | ICD-10-CM | POA: Diagnosis not present

## 2020-05-21 LAB — GLUCOSE, CAPILLARY
Glucose-Capillary: 82 mg/dL (ref 70–99)
Glucose-Capillary: 121 mg/dL — ABNORMAL HIGH (ref 70–99)

## 2020-05-21 NOTE — TOC Progression Note (Addendum)
Transition of Care [facility]) - Progression Note    Patient Details  Name: Brian [name] MRN: [id number] Date of Birth: 1946-01-01  Transition of Care [facility]) CM/SW Contact  Joanne Chars, LCSW Phone Number: 05/21/2020, 11:13 AM  Clinical Narrative:   Updated clinicals sent to [facility] [phone], [place] Amor  1130: fax did not go through.  LM with Helene Kelp at [place]  1500: CSW spoke with Helene Kelp at [employer].  [place] now requesting peer to peer: [phone], option 3.    Dr Cathlean Sauer informed at 1600 and will call.    1615: Dr Cathlean Sauer informed that peer to peer is too late, had to be done yesterday.   1615: CSW spoke with Helene Kelp at [place], relayed the above regarding peer to peer.  She is resubmitting the entire [place] request.           Expected Discharge Plan and Services           Expected Discharge Date: 05/16/20                                     Social Determinants of Health (SDOH) Interventions    Readmission Risk Interventions Readmission Risk Prevention Plan 04/03/2019 06/29/2018  Post Dischage Appt - Complete  Medication Screening - Complete  Transportation Screening Complete Complete  PCP or Specialist Appt within 5-7 Days Complete -  Home Care Screening Complete -  Medication Review (RN CM) Complete -  Some recent data might be hidden

## 2020-05-21 NOTE — Progress Notes (Signed)
Physical Therapy Treatment Patient Details Name: Brian [name] MRN: [id number] DOB: Jul 24, 1945 Today's Date: 05/21/2020    History of Present Illness 75 year old gentleman admitted with multiple falls and somnolence.Pt  states he might have taken double dose of tramadol along with gabapentin. On arrival to ED, pt hypotensive with blood pressure of 71/48.  Resuscitated with fluid.  Found to have AKI, bilateral PE.   PMH:  active cocaine use, smoker, history of a stroke with left-sided hemiparesis, brain aneurysm on anticoagulation and bleeding on 1/21, spontaneous subdural hematoma on 2/21 status post burr hole and evacuation, seizure disorders    PT Comments    Pt received in bed, cooperative and willing to participate in PT. Generally min Ax1 to min guard for mobility. Pt able to complete stand pivot to chair without AD, reporting that PTA he would transfer from his bed to a scooter. Cueing for sequencing. Pt following simple commands consistently with increased time. Pt would benefit from short term rehab to improve strength, decrease risk for falls, and increase independency. Will continue to follow acutely.   Follow Up Recommendations  SNF;Supervision/Assistance - 24 hour;Other (comment) (Maximize home health services if denied SNF)     Equipment Recommendations  None recommended by PT    Recommendations for Other Services       Precautions / Restrictions Precautions Precautions: Fall Restrictions Weight Bearing Restrictions: No    Mobility  Bed Mobility Overal bed mobility: Needs Assistance Bed Mobility: Rolling;Sidelying to Sit Rolling: Min assist Sidelying to sit: Min assist       General bed mobility comments: Use of bed rail, pt states he has hospital bed at home, min A to bring trunk upright and to initially prevent posterior lean, cueing for pt to lean forward and scoot forward, pt able to correct posterior lean with cueing    Transfers Overall transfer level:  Needs assistance Equipment used: None Transfers: Stand Pivot Transfers Sit to Stand: Min guard Stand pivot transfers: Min guard       General transfer comment: Drop arm put down on chair and moved close to bed, min guard for safety, no physical assist given, pt with flexed posture but no LOB  Ambulation/Gait             General Gait Details: unable   Stairs             Wheelchair Mobility    Modified Rankin (Stroke Patients Only)       Balance Overall balance assessment: Needs assistance Sitting-balance support: No upper extremity supported;Feet supported Sitting balance-Leahy Scale: Fair     Standing balance support: Bilateral upper extremity supported;During functional activity Standing balance-Leahy Scale: Poor Standing balance comment: BUE support on RW for ambulation, fairly steady with no AD for stand pivot                            Cognition Arousal/Alertness: Awake/alert Behavior During Therapy: Flat affect Overall Cognitive Status: No family/caregiver present to determine baseline cognitive functioning Area of Impairment: Memory;Following commands;Safety/judgement;Awareness;Problem solving                     Memory: Decreased short-term memory Following Commands: Follows one step commands with increased time Safety/Judgement: Decreased awareness of safety;Decreased awareness of deficits   Problem Solving: Slow processing;Decreased initiation;Requires verbal cues;Difficulty sequencing        Exercises      General Comments  Pertinent Vitals/Pain Pain Assessment: No/denies pain    Home Living                      Prior Function            PT Goals (current goals can now be found in the care plan section) Acute Rehab PT Goals Patient Stated Goal: to get out of here PT Goal Formulation: With patient Time For Goal Achievement: 05/25/20 Potential to Achieve Goals: Good Progress towards PT goals:  Progressing toward goals    Frequency    Min 2X/week      PT Plan Current plan remains appropriate    Co-evaluation              AM-PAC PT "6 Clicks" Mobility   Outcome Measure  Help needed turning from your back to your side while in a flat bed without using bedrails?: A Little Help needed moving from lying on your back to sitting on the side of a flat bed without using bedrails?: A Little Help needed moving to and from a bed to a chair (including a wheelchair)?: A Little Help needed standing up from a chair using your arms (e.g., wheelchair or bedside chair)?: A Little Help needed to walk in hospital room?: A Lot Help needed climbing 3-5 steps with a railing? : Total 6 Click Score: 15    End of Session   Activity Tolerance: Patient tolerated treatment well (beginning to resist movements to reposition, pt self limiting) Patient left: with call bell/phone within reach;in chair;with chair alarm set Nurse Communication: Mobility status PT Visit Diagnosis: Unsteadiness on feet (R26.81);Muscle weakness (generalized) (M62.81)     Time: 1102-1117 PT Time Calculation (min) (ACUTE ONLY): 21 min  Charges:  $Therapeutic Activity: 8-22 mins                     Rosita Kea, SPT

## 2020-05-21 NOTE — Plan of Care (Signed)
  Problem: Activity: Goal: Risk for activity intolerance will decrease Outcome: Progressing   Problem: Nutrition: Goal: Adequate nutrition will be maintained Outcome: Progressing   Problem: Coping: Goal: Level of anxiety will decrease Outcome: Progressing   Problem: Elimination: Goal: Will not experience complications related to bowel motility Outcome: Progressing   Problem: Safety: Goal: Ability to remain free from injury will improve Outcome: Progressing   

## 2020-05-21 NOTE — Progress Notes (Signed)
PROGRESS NOTE    Brian [name]  HUT:[id number] DOB: Sep 12, 1945 DOA: 05/09/2020 PCP: [facility]    Brief Narrative:  Brian [name] was admitted to the hospital with the working diagnosis of acute hypoxic respiratory failure due to bilateral pulmonary embolism,in the setting of left flank/groin cellulitis, complicated with severe sepsis, present on admission.  75 year old male past medical history for CVA with left-sided deficit, subdural hematoma, subarachnoid hemorrhage, hypertension, chronic pain syndrome, cocaine and tobacco abuse who presented with multiple falls. Patient reported becoming acutely dyspneic over the last 24 hours prior to hospitalization,at home he sustained a mechanical fall, EMS came to his home and helped back into his chair. Later same day EMS was called again, he was found hypotensive 71/48, he received 700 mL of saline intravenously and was transported to the hospital. On route he became unresponsive, requiring bag valve mask ventilation.At arrival to the ED his blood pressure was back up to 138/108, heart rate 110, temperature 97.9, respiratory rate 25, he was very poor hygiene, his lungs are clear to auscultation, heart S1-S2, present, rhythmic, his abdomen was soft, no lower extremity edema, left leg with erythema extending to the left groin, he was alert and oriented, decreased strength on the left side.  Sodium 136, potassium 5.2, chloride 103, bicarb 18, glucose 134, BUN 30, creatinine 2.0,,high sensitive troponin to [phone]. White count 11.6, hemoglobin 12.1, hematocrit 38.5, platelets 171. SARS COVID-19 negative. Urinalysis specific gravity 1.019, 0-5 white cells, 0-5 red cells. Toxicology positive for cocaine. Chest radiograph no infiltrates. 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  CT chest positive for acute bilateral pulmonary emboli, diffuse right lung and segmental involvement. Lesser involvement in the left lung. Positive emphysema.  EKG 104 bpm, normal axis, normal intervals, sinus rhythm, ST depression lead II, lead III, aVF, V4-V6, no significant T wave changes.  Patient was admitted to the intensive care unit, patient was placed on anticoagulation with IV heparin and IV antibiotic therapy.  Further work-up with echocardiography showed reduced RV systolic function. Due to history of intracranial bleed TPA was not given. Lower extremity Dopplers positive for deep vein thrombosis bilaterally.  During his hospitalization he developed encephalopathy, with supportive medical therapy and has improved.  EEG with no seizures.   Patient continued to be very weak and deconditioned, currently waiting to be transferred to a skilled nursing facility.   Assessment & Plan:   Principal Problem:   Pulmonary emboli (HCC) Active Problems:   Renal failure, acute (HCC)   Pressure injury of skin   Acute metabolic encephalopathy   Cellulitis    1. Acute bilateral pulmonary embolism, bilateral lower extremities DVT. Patient continue hemodynamcaly stable and with good oxygenation (100% on room air), no chest pain or dyspnea, very weak and deconditioned.   Tolerating well anticoagulation with apixaban.  Discontinue telemetry monitor and transfer to medical ward, he will need prolonged therapy with apixaban.   2. Acute metabolic encephalopathy/ in the setting of hx of CVA/ intracranial bleeding (SAH/SDA). Hx of seizures  Patient is awake and alert, continue to be very weak and deconditioned, he is asking to go home, but due to his deconditioning is not safe.  On oxycodone and gabapentin for pain control with good toleration.   Continue with keppra, no active seizures.   3. Left flank cellulitis, complicated with severe sepsis (end organ failure  hypotension), present  on admission. Completed antibiotic therapy.  4. Chronic diastolic heart failure/ HTN. No signs of acute exacerbation. Elevated troponin likely demand ischemia in the setting of sepsis and pulmonary embolism, no ACS.  Continue with amlodipine for blood pressure control.   5. AKI. On 03/20 serum cr at 1,0. Patient tolerating po well.   6. Chronic pain syndrome. Hx of polysubstance abuse. No signs of acute withdrawal symptoms. Pain is well controlled.   7. Stage 1 sacrum pressure ulcer not present on admission. Local skin care per protocol.    Status is: Inpatient  Remains inpatient appropriate because:Unsafe d/c plan   Dispo:  Patient From: Home  Planned Disposition: [place]  Medically stable for discharge: Yes     DVT prophylaxis: apixaban   Code Status:   full  Family Communication:   no family at the bedside      Nutrition Status:           Skin Documentation: Pressure Injury 05/10/20 Sacrum Left Stage 1 -  Intact skin with non-blanchable redness of a localized area usually over a bony prominence. (Active)  05/10/20 1500  Location: Sacrum  Location Orientation: Left  Staging: Stage 1 -  Intact skin with non-blanchable redness of a localized area usually over a bony prominence.  Wound Description (Comments):   Present on Admission: No      Subjective: Patient is feeling better, continue to be very weak and deconditioned high fall risk, no nausea or vomiting. No chest pain or dyspnea. Poor family support.   Objective: Vitals:   05/20/20 2246 05/21/20 0500 05/21/20 0537 05/21/20 1035  BP: 93/79  112/89 113/82  Pulse: 91  82 100  Resp: 18  16 19   Temp: 98.6 F (37 C)  97.7 F (36.5 C) 97.8 F (36.6 C)  TempSrc: Oral  Oral Oral  SpO2: 97%  97% 100%  Weight:  64.4 kg    Height:       No intake or output data in the 24 hours ending 05/21/20 1357 Filed Weights   05/16/20 0500 05/19/20 0500 05/21/20 0500   Weight: 67.4 kg 63.9 kg 64.4 kg    Examination:   General: deconditioned and ill looking appearing  Neurology: Awake and alert, non focal  E ENT: positive pallor, no icterus, oral mucosa moist Cardiovascular: No JVD. S1-S2 present, rhythmic, no gallops, rubs, or murmurs. Trace lower extremity edema. Pulmonary: positive breath sounds bilaterally, Gastrointestinal. Abdomen soft and non tender Skin. No rashes Musculoskeletal: no joint deformities     Data Reviewed: I have personally reviewed following labs and imaging studies  CBC: No results for input(s): WBC, NEUTROABS, HGB, HCT, MCV, PLT in the last 168 hours. Basic Metabolic Panel: No results for input(s): NA, K, CL, CO2, GLUCOSE, BUN, CREATININE, CALCIUM, MG, PHOS in the last 168 hours. GFR: Estimated Creatinine Clearance: 59.9 mL/min (by C-G formula based on SCr of 1 mg/dL). Liver Function Tests: No results for input(s): AST, ALT, ALKPHOS, BILITOT, PROT, ALBUMIN in the last 168 hours. No results for input(s): LIPASE, AMYLASE in the last 168 hours. No results for input(s): AMMONIA in the last 168 hours. Coagulation Profile: No results for input(s): INR, PROTIME in the last 168 hours. Cardiac Enzymes: No results for input(s): CKTOTAL, CKMB, CKMBINDEX, TROPONINI in the last 168 hours. BNP (last 3 results) No results for input(s): PROBNP in the last 8760 hours. HbA1C: No results for input(s): HGBA1C in the last 72 hours. CBG: Recent Labs  Lab 05/15/20 1153 05/15/20 1620 05/19/20  2125 05/21/20 0713 05/21/20 1118  GLUCAP 89 84 116* 82 121*   Lipid Profile: No results for input(s): CHOL, HDL, LDLCALC, TRIG, CHOLHDL, LDLDIRECT in the last 72 hours. Thyroid Function Tests: No results for input(s): TSH, T4TOTAL, FREET4, T3FREE, THYROIDAB in the last 72 hours. Anemia Panel: No results for input(s): VITAMINB12, FOLATE, FERRITIN, TIBC, IRON, RETICCTPCT in the last 72 hours.    Radiology Studies: I have reviewed all of  the imaging during this hospital visit personally     Scheduled Meds: . amLODipine  5 mg Oral Daily  . apixaban  5 mg Oral BID  . Chlorhexidine Gluconate Cloth  6 each Topical Daily  . gabapentin  400 mg Oral QHS  . levETIRAcetam  500 mg Oral BID  . mouth rinse  15 mL Mouth Rinse BID  . nicotine  21 mg Transdermal Daily  . polyethylene glycol  17 g Oral Once  . senna-docusate  2 tablet Oral BID   Continuous Infusions:   LOS: 12 days        Dynastie Knoop Gerome Apley, MD

## 2020-05-22 DIAGNOSIS — I2609 Other pulmonary embolism with acute cor pulmonale: Secondary | ICD-10-CM | POA: Diagnosis not present

## 2020-05-22 NOTE — Progress Notes (Signed)
Physical Therapy Treatment Patient Details Name: Brian [name] MRN: [id number] DOB: [birth year] Today's Date: 05/22/2020    History of Present Illness 75 year old gentleman admitted with multiple falls and somnolence.Pt  states he might have taken double dose of tramadol along with gabapentin. On arrival to ED, pt hypotensive with blood pressure of 71/48.  Resuscitated with fluid.  Found to have AKI, bilateral PE.   PMH:  active cocaine use, smoker, history of a stroke with left-sided hemiparesis, brain aneurysm on anticoagulation and bleeding on 1/21, spontaneous subdural hematoma on 2/21 status post burr hole and evacuation, seizure disorders    PT Comments    Pt with limited participation in session. Pt requiring increased coaxing to get into chair. Pt educated on importance of sitting up and agreeable to sit up if RN would get him back to bed quickly. RN made aware of request and BP. Pt continues to demonstrate deficits in balance, strength, coordination, gait, safety and endurance and will benefit from skilled PT to address deficits to maximize independence with functional mobility prior to discharge.     Follow Up Recommendations  SNF;Supervision/Assistance - 24 hour;Other (comment) (Maximize home health services if denied SNF)     Equipment Recommendations  None recommended by PT    Recommendations for Other Services       Precautions / Restrictions Precautions Precautions: Fall Restrictions Weight Bearing Restrictions: No    Mobility  Bed Mobility Overal bed mobility: Needs Assistance Bed Mobility: Supine to Sit     Supine to sit: Mod assist     General bed mobility comments: pt requiring mod A to bring trunk up with increased lateral lean R and posteriorly. requiring assist to correct then able to maintain once corrected    Transfers Overall transfer level: Needs assistance Equipment used: Rolling walker (2 wheeled) Transfers: Sit to/from Stand Sit to Stand: Min  assist            Ambulation/Gait Ambulation/Gait assistance: Mod assist;Min assist Gait Distance (Feet): 6 Feet Assistive device: Rolling walker (2 wheeled)       General Gait Details: shuffling gait with crossing LE during ambulation, increased forward flexed posture and posterior lean, decreased safety and coordination wtih use of RW, 1 LOB posteriorly requirng mod A to regain balance   Stairs             Wheelchair Mobility    Modified Rankin (Stroke Patients Only)       Balance                                            Cognition                                     Problem Solving: Slow processing;Decreased initiation;Requires verbal cues;Difficulty sequencing        Exercises      General Comments General comments (skin integrity, edema, etc.): BP 122/107 RN made aware; HR 93 and O2 97%      Pertinent Vitals/Pain Pain Assessment: No/denies pain    Home Living                      Prior Function            PT Goals (current goals can now be found in  the care plan section) Acute Rehab PT Goals Patient Stated Goal: to get out of here PT Goal Formulation: With patient Time For Goal Achievement: 05/25/20 Potential to Achieve Goals: Good Progress towards PT goals: Progressing toward goals    Frequency    Min 2X/week      PT Plan Current plan remains appropriate    Co-evaluation              AM-PAC PT "6 Clicks" Mobility   Outcome Measure  Help needed turning from your back to your side while in a flat bed without using bedrails?: A Little Help needed moving from lying on your back to sitting on the side of a flat bed without using bedrails?: A Lot Help needed moving to and from a bed to a chair (including a wheelchair)?: A Lot Help needed standing up from a chair using your arms (e.g., wheelchair or bedside chair)?: A Little Help needed to walk in hospital room?: A Lot Help needed  climbing 3-5 steps with a railing? : Total 6 Click Score: 13    End of Session Equipment Utilized During Treatment: Gait belt Activity Tolerance: Patient tolerated treatment well Patient left: in chair;with call bell/phone within reach;with chair alarm set;with nursing/sitter in room Nurse Communication: Mobility status PT Visit Diagnosis: Unsteadiness on feet (R26.81);Muscle weakness (generalized) (M62.81)     Time: 3244-0102 PT Time Calculation (min) (ACUTE ONLY): 10 min  Charges:  $Therapeutic Activity: 8-22 mins                     Lyanne Co, DPT Acute Rehabilitation Services [phone]   [place] 05/22/2020, 11:38 AM

## 2020-05-22 NOTE — Progress Notes (Signed)
Occupational Therapy Treatment Patient Details Name: Brian [name] MRN: [id number] DOB: [birth year] Today's Date: 05/22/2020    History of present illness 75 year old gentleman admitted with multiple falls and somnolence.Pt  states he might have taken double dose of tramadol along with gabapentin. On arrival to ED, pt hypotensive with blood pressure of 71/48.  Resuscitated with fluid.  Found to have AKI, bilateral PE.   PMH:  active cocaine use, smoker, history of a stroke with left-sided hemiparesis, brain aneurysm on anticoagulation and bleeding on 1/21, spontaneous subdural hematoma on 2/21 status post burr hole and evacuation, seizure disorders   OT comments  Pt very engaged this date.  He was able to perform grooming with set up/supervision, and bathing with min - mod A.  He transferred to [facility] with mod A.  He is making excellent progress toward goals.  Will continue to follow.   Follow Up Recommendations  SNF    Equipment Recommendations  3 in 1 bedside commode    Recommendations for Other Services      Precautions / Restrictions Precautions Precautions: Fall       Mobility Bed Mobility Overal bed mobility: Needs Assistance Bed Mobility: Sit to Supine       Sit to supine: Min assist   General bed mobility comments: assist to lift LEs onto the bed    Transfers Overall transfer level: Needs assistance Equipment used: 1 person hand held assist Transfers: Sit to/from [facility] Sit to Stand: Min assist Stand pivot transfers: Mod assist       General transfer comment: assist for balance    Balance Overall balance assessment: Needs assistance Sitting-balance support: No upper extremity supported;Feet supported Sitting balance-Leahy Scale: Fair     Standing balance support: Single extremity supported;During functional activity Standing balance-Leahy Scale: Poor Standing balance comment: Pt requires up to mod A for static standing                            ADL either performed or assessed with clinical judgement   ADL Overall ADL's : Needs assistance/impaired Eating/Feeding: Set up;Sitting   Grooming: Wash/dry hands;Wash/dry face;Brushing hair;Set up;Supervision/safety;Sitting   Upper Body Bathing: Set up;Supervision/ safety;Sitting   Lower Body Bathing: Moderate assistance;Sit to/from stand Lower Body Bathing Details (indicate cue type and reason): assist for feet and posterior peri area Upper Body Dressing : Set up;Supervision/safety;Sitting   Lower Body Dressing: Moderate assistance;Sit to/from stand   Toilet Transfer: Moderate assistance;Stand-pivot;BSC   Toileting- Clothing Manipulation and Hygiene: Maximal assistance;Sit to/from stand       Functional mobility during ADLs: Moderate assistance       Vision       Perception     Praxis      Cognition Arousal/Alertness: Awake/alert Behavior During Therapy: WFL for tasks assessed/performed;Impulsive Overall Cognitive Status: No family/caregiver present to determine baseline cognitive functioning                           Safety/Judgement: Decreased awareness of safety;Decreased awareness of deficits   Problem Solving: Requires verbal cues;Requires tactile cues General Comments: No family present to provide insight into pt's baseline.  He enjoys bantering        Exercises     Shoulder Instructions       General Comments      Pertinent Vitals/ Pain       Pain Assessment: Faces Faces Pain Scale: Hurts even more  Pain Location: back Pain Descriptors / Indicators: Restless Pain Intervention(s): Monitored during session;Repositioned  Home Living                                          Prior Functioning/Environment              Frequency  Min 2X/week        Progress Toward Goals  OT Goals(current goals can now be found in the care plan section)  Progress towards OT goals: Progressing toward  goals     Plan Discharge plan remains appropriate    Co-evaluation                 AM-PAC OT "6 Clicks" Daily Activity     Outcome Measure   Help from another person eating meals?: A Little Help from another person taking care of personal grooming?: A Little Help from another person toileting, which includes using toliet, bedpan, or urinal?: A Lot Help from another person bathing (including washing, rinsing, drying)?: A Little Help from another person to put on and taking off regular upper body clothing?: A Little Help from another person to put on and taking off regular lower body clothing?: A Lot 6 Click Score: 16    End of Session Equipment Utilized During Treatment: Gait belt  OT Visit Diagnosis: Unsteadiness on feet (R26.81);Muscle weakness (generalized) (M62.81);Other symptoms and signs involving cognitive function   Activity Tolerance Patient tolerated treatment well   Patient Left in bed;with call bell/phone within reach;with bed alarm set   Nurse Communication Mobility status        Time: 6237-6283 OT Time Calculation (min): 49 min  Charges: OT General Charges $OT Visit: 1 Visit OT Treatments $Self Care/Home Management : 38-52 mins  Nilsa Nutting OTR/L Acute Rehabilitation Services Pager [phone] Office [place], [place] 05/22/2020, 4:19 PM

## 2020-05-22 NOTE — Progress Notes (Signed)
PROGRESS NOTE    HENRY UTSEY  EHM:[id number] DOB: August 12, 1945 DOA: 05/09/2020 PCP: [facility]    Brief Narrative:  Mr. Lada was admitted to the hospital with the working diagnosis of acute hypoxic respiratory failure due to bilateral pulmonary embolism,in the setting of left flank/groin cellulitis, complicated with severe sepsis, present on admission.  75 year old male past medical history for CVA with left-sided deficit, subdural hematoma, subarachnoid hemorrhage, hypertension, chronic pain syndrome, cocaine and tobacco abuse who presented with multiple falls. Patient reported becoming acutely dyspneic over the last 24 hours prior to hospitalization,at home he sustained a mechanical fall, EMS came to his home and helped back into his chair. Later same day EMS was called again, he was found hypotensive 71/48, he received 700 mL of saline intravenously and was transported to the hospital. On route he became unresponsive, requiring bag valve mask ventilation.At arrival to the ED his blood pressure was back up to 138/108, heart rate 110, temperature 97.9, respiratory rate 25, he was very poor hygiene, his lungs are clear to auscultation, heart S1-S2, present, rhythmic, his abdomen was soft, no lower extremity edema, left leg with erythema extending to the left groin, he was alert and oriented, decreased strength on the left side.  Sodium 136, potassium 5.2, chloride 103, bicarb 18, glucose 134, BUN 30, creatinine 2.0,,high sensitive troponin to [phone]. White count 11.6, hemoglobin 12.1, hematocrit 38.5, platelets 171. SARS COVID-19 negative. Urinalysis specific gravity 1.019, 0-5 white cells, 0-5 red cells. Toxicology positive for cocaine. Chest radiograph no infiltrates. 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  CT chest positive for acute bilateral pulmonary emboli, diffuse right lung and segmental involvement. Lesser involvement in the left lung. Positive emphysema.  EKG 104 bpm, normal axis, normal intervals, sinus rhythm, ST depression lead II, lead III, aVF, V4-V6, no significant T wave changes.  Patient was admitted to the intensive care unit, patient was placed on anticoagulation with IV heparin and IV antibiotic therapy.  Further work-up with echocardiography showed reduced RV systolic function. Due to history of intracranial bleed TPA was not given. Lower extremity Dopplers positive for deep vein thrombosis bilaterally.  During his hospitalization he developed encephalopathy, with supportive medical therapy and has improved.  EEG with no seizures.   Patient continued to be very weak and deconditioned, currently waiting to be transferred to a skilled nursing facility.  05/22/2020: Patient seen.  No new changes.  Patient has no new complaints.  Patient is not very talkative.   Assessment & Plan:   Principal Problem:   Pulmonary emboli (HCC) Active Problems:   Renal failure, acute (HCC)   Pressure injury of skin   Acute metabolic encephalopathy   Cellulitis    1. Acute bilateral pulmonary embolism, bilateral lower extremities DVT. Patient continue hemodynamcaly stable and with good oxygenation (100% on room air), no chest pain or dyspnea, very weak and deconditioned.   Tolerating well anticoagulation with apixaban.  Discontinue telemetry monitor and transfer to medical ward, he will need prolonged therapy with apixaban.   2. Acute metabolic encephalopathy/ in the setting of hx of CVA/ intracranial bleeding (SAH/SDA). Hx of seizures  Patient is awake and alert, continue to be very weak and deconditioned, he is asking to go home, but due to his deconditioning is not safe.  On oxycodone and gabapentin for pain control with good toleration.   Continue with  keppra, no active seizures.   3. Left flank cellulitis, complicated with severe sepsis (end organ failure hypotension), present on admission. Completed antibiotic therapy.  4. Chronic diastolic heart failure/ HTN. No signs of acute exacerbation. Elevated troponin likely demand ischemia in the setting of sepsis and pulmonary embolism, no ACS.  Continue with amlodipine for blood pressure control.   5. AKI. On 03/20 serum cr at 1,0. Patient tolerating po well.   6. Chronic pain syndrome. Hx of polysubstance abuse. No signs of acute withdrawal symptoms. Pain is well controlled.   7. Stage 1 sacrum pressure ulcer not present on admission. Local skin care per protocol.    Status is: Inpatient  Remains inpatient appropriate because:Unsafe d/c plan   Dispo:  Patient From: Home  Planned Disposition: [place]  Medically stable for discharge: Yes     DVT prophylaxis: apixaban   Code Status:   full  Family Communication:   no family at the bedside      Nutrition Status:           Skin Documentation: Pressure Injury 05/10/20 Sacrum Left Stage 1 -  Intact skin with non-blanchable redness of a localized area usually over a bony prominence. (Active)  05/10/20 1500  Location: Sacrum  Location Orientation: Left  Staging: Stage 1 -  Intact skin with non-blanchable redness of a localized area usually over a bony prominence.  Wound Description (Comments):   Present on Admission: No      Subjective: No new complaints. No shortness of breath or chest pain. No fever or chills.  Objective: Vitals:   05/22/20 0555 05/22/20 0745 05/22/20 1029 05/22/20 1128  BP: 110/65  (!) 122/107 105/85  Pulse: 88 84    Resp: 18 20 20 19   Temp: 98 F (36.7 C)  98.7 F (37.1 C) 98.7 F (37.1 C)  TempSrc: Oral  Oral Oral  SpO2: 98%  96% 100%  Weight:      Height:        Intake/Output Summary (Last 24 hours) at 05/22/2020 1456 Last data filed at 05/22/2020 0800 Gross  per 24 hour  Intake --  Output 100 ml  Net -100 ml   Filed Weights   05/16/20 0500 05/19/20 0500 05/21/20 0500  Weight: 67.4 kg 63.9 kg 64.4 kg    Examination:   General: deconditioned and ill looking appearing  Neurology: Awake and alert, non focal  E ENT: positive pallor, no icterus, oral mucosa moist Cardiovascular: No JVD. S1-S2 present, rhythmic, no gallops, rubs, or murmurs. Trace lower extremity edema. Pulmonary: positive breath sounds bilaterally, Gastrointestinal. Abdomen soft and non tender Skin. No rashes Musculoskeletal: no joint deformities     Data Reviewed: I have personally reviewed following labs and imaging studies  CBC: No results for input(s): WBC, NEUTROABS, HGB, HCT, MCV, PLT in the last 168 hours. Basic Metabolic Panel: No results for input(s): NA, K, CL, CO2, GLUCOSE, BUN, CREATININE, CALCIUM, MG, PHOS in the last 168 hours. GFR: Estimated Creatinine Clearance: 59.9 mL/min (by C-G formula based on SCr of 1 mg/dL). Liver Function Tests: No results for input(s): AST, ALT, ALKPHOS, BILITOT, PROT, ALBUMIN in the last 168 hours. No results for input(s): LIPASE, AMYLASE in the last 168 hours. No results for input(s): AMMONIA in the last 168 hours. Coagulation Profile: No results for input(s): INR, PROTIME in the last 168 hours. Cardiac Enzymes: No results for input(s): CKTOTAL, CKMB, CKMBINDEX, TROPONINI in the last 168 hours. BNP (last 3 results) No results for input(s): PROBNP  in the last 8760 hours. HbA1C: No results for input(s): HGBA1C in the last 72 hours. CBG: Recent Labs  Lab 05/15/20 1620 05/19/20 2125 05/21/20 0713 05/21/20 1118  GLUCAP 84 116* 82 121*   Lipid Profile: No results for input(s): CHOL, HDL, LDLCALC, TRIG, CHOLHDL, LDLDIRECT in the last 72 hours. Thyroid Function Tests: No results for input(s): TSH, T4TOTAL, FREET4, T3FREE, THYROIDAB in the last 72 hours. Anemia Panel: No results for input(s): VITAMINB12, FOLATE,  FERRITIN, TIBC, IRON, RETICCTPCT in the last 72 hours.    Radiology Studies: I have reviewed all of the imaging during this hospital visit personally     Scheduled Meds: . amLODipine  5 mg Oral Daily  . apixaban  5 mg Oral BID  . Chlorhexidine Gluconate Cloth  6 each Topical Daily  . gabapentin  400 mg Oral QHS  . levETIRAcetam  500 mg Oral BID  . mouth rinse  15 mL Mouth Rinse BID  . nicotine  21 mg Transdermal Daily  . polyethylene glycol  17 g Oral Once  . senna-docusate  2 tablet Oral BID   Continuous Infusions:   LOS: 13 days        Bonnell Public, MD

## 2020-05-22 NOTE — Plan of Care (Signed)

## 2020-05-23 DIAGNOSIS — I2609 Other pulmonary embolism with acute cor pulmonale: Secondary | ICD-10-CM | POA: Diagnosis not present

## 2020-05-23 NOTE — Progress Notes (Signed)
PROGRESS NOTE    Brian [name]  JKK:[id number] DOB: 11-06-1945 DOA: 05/09/2020 PCP: [facility]    Brief Narrative:  Brian [name] was admitted to the hospital with the working diagnosis of acute hypoxic respiratory failure due to bilateral pulmonary embolism,in the setting of left flank/groin cellulitis, complicated with severe sepsis, present on admission.  75 year old male past medical history for CVA with left-sided deficit, subdural hematoma, subarachnoid hemorrhage, hypertension, chronic pain syndrome, cocaine and tobacco abuse who presented with multiple falls. Patient reported becoming acutely dyspneic over the last 24 hours prior to hospitalization,at home he sustained a mechanical fall, EMS came to his home and helped back into his chair. Later same day EMS was called again, he was found hypotensive 71/48, he received 700 mL of saline intravenously and was transported to the hospital. On route he became unresponsive, requiring bag valve mask ventilation.At arrival to the ED his blood pressure was back up to 138/108, heart rate 110, temperature 97.9, respiratory rate 25, he was very poor hygiene, his lungs are clear to auscultation, heart S1-S2, present, rhythmic, his abdomen was soft, no lower extremity edema, left leg with erythema extending to the left groin, he was alert and oriented, decreased strength on the left side.  Sodium 136, potassium 5.2, chloride 103, bicarb 18, glucose 134, BUN 30, creatinine 2.0,,high sensitive troponin to [phone]. White count 11.6, hemoglobin 12.1, hematocrit 38.5, platelets 171. SARS COVID-19 negative. Urinalysis specific gravity 1.019, 0-5 white cells, 0-5 red cells. Toxicology positive for cocaine. Chest radiograph no infiltrates. 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  CT chest positive for acute bilateral pulmonary emboli, diffuse right lung and segmental involvement. Lesser involvement in the left lung. Positive emphysema.  EKG 104 bpm, normal axis, normal intervals, sinus rhythm, ST depression lead II, lead III, aVF, V4-V6, no significant T wave changes.  Patient was admitted to the intensive care unit, patient was placed on anticoagulation with IV heparin and IV antibiotic therapy.  Further work-up with echocardiography showed reduced RV systolic function. Due to history of intracranial bleed TPA was not given. Lower extremity Dopplers positive for deep vein thrombosis bilaterally.  During his hospitalization he developed encephalopathy, with supportive medical therapy and has improved.  EEG with no seizures.   Patient continued to be very weak and deconditioned, currently waiting to be transferred to a skilled nursing facility.  05/23/2020: Patient seen.  No new changes.  Patient has no new complaints.  Patient is more communicative today.  Awaiting SNF placement.   Assessment & Plan:   Principal Problem:   Pulmonary emboli (HCC) Active Problems:   Renal failure, acute (HCC)   Pressure injury of skin   Acute metabolic encephalopathy   Cellulitis    1. Acute bilateral pulmonary embolism, bilateral lower extremities DVT. Patient continue hemodynamcaly stable and with good oxygenation (100% on room air), no chest pain or dyspnea, very weak and deconditioned.   Tolerating well anticoagulation with apixaban.   2. Acute metabolic encephalopathy/ in the setting of hx of CVA/ intracranial bleeding (SAH/SDA). Hx of seizures  Patient is awake and alert, continue to be very weak and deconditioned, he is asking to go home, but due to his deconditioning is not safe.  On oxycodone and gabapentin for pain control with good toleration.   Continue with keppra, no active seizures.   3. Left flank cellulitis, complicated with  severe sepsis (end organ failure hypotension), present on admission. Completed antibiotic therapy.  4. Chronic diastolic heart failure/ HTN. No signs of acute exacerbation. Elevated troponin likely demand ischemia in the setting of sepsis and pulmonary embolism, no ACS.  Continue with amlodipine for blood pressure control.   5. AKI. On 03/20 serum cr at 1,0. Patient tolerating po well.   6. Chronic pain syndrome. Hx of polysubstance abuse. No signs of acute withdrawal symptoms. Pain is well controlled.   7. Stage 1 sacrum pressure ulcer not present on admission. Local skin care per protocol.    Status is: Inpatient  Remains inpatient appropriate because:Unsafe d/c plan   Dispo:  Patient From: Home  Planned Disposition: [place]  Medically stable for discharge: Yes     DVT prophylaxis: apixaban   Code Status:   full  Family Communication:   no family at the bedside      Nutrition Status:           Skin Documentation: Pressure Injury 05/10/20 Sacrum Left Stage 1 -  Intact skin with non-blanchable redness of a localized area usually over a bony prominence. (Active)  05/10/20 1500  Location: Sacrum  Location Orientation: Left  Staging: Stage 1 -  Intact skin with non-blanchable redness of a localized area usually over a bony prominence.  Wound Description (Comments):   Present on Admission: No      Subjective: No new complaints. No shortness of breath or chest pain. No fever or chills.  Objective: Vitals:   05/22/20 2100 05/23/20 0603 05/23/20 1048 05/23/20 1546  BP: 118/87 130/70 117/83 92/74  Pulse: 85 80 85 90  Resp: 20 20 20 16   Temp: 98.1 F (36.7 C) 98 F (36.7 C)  98.6 F (37 C)  TempSrc: Oral Oral    SpO2: 99% 97% 94% 100%  Weight:      Height:        Intake/Output Summary (Last 24 hours) at 05/23/2020 1701 Last data filed at 05/23/2020 1625 Gross per 24 hour  Intake --  Output 800 ml  Net -800 ml   Filed Weights    05/16/20 0500 05/19/20 0500 05/21/20 0500  Weight: 67.4 kg 63.9 kg 64.4 kg    Examination:   General: deconditioned and ill looking appearing  Neurology: Awake and alert, non focal  E ENT: positive pallor, no icterus, oral mucosa moist Cardiovascular: No JVD. S1-S2 present, rhythmic, no gallops, rubs, or murmurs. Trace lower extremity edema. Pulmonary: positive breath sounds bilaterally, Gastrointestinal. Abdomen soft and non tender Skin. No rashes Musculoskeletal: no joint deformities     Data Reviewed: I have personally reviewed following labs and imaging studies  CBC: No results for input(s): WBC, NEUTROABS, HGB, HCT, MCV, PLT in the last 168 hours. Basic Metabolic Panel: No results for input(s): NA, K, CL, CO2, GLUCOSE, BUN, CREATININE, CALCIUM, MG, PHOS in the last 168 hours. GFR: Estimated Creatinine Clearance: 59.9 mL/min (by C-G formula based on SCr of 1 mg/dL). Liver Function Tests: No results for input(s): AST, ALT, ALKPHOS, BILITOT, PROT, ALBUMIN in the last 168 hours. No results for input(s): LIPASE, AMYLASE in the last 168 hours. No results for input(s): AMMONIA in the last 168 hours. Coagulation Profile: No results for input(s): INR, PROTIME in the last 168 hours. Cardiac Enzymes: No results for input(s): CKTOTAL, CKMB, CKMBINDEX, TROPONINI in the last 168 hours. BNP (last 3 results) No results for input(s): PROBNP in the last 8760 hours. HbA1C: No results for input(s): HGBA1C in the  last 72 hours. CBG: Recent Labs  Lab 05/19/20 2125 05/21/20 0713 05/21/20 1118  GLUCAP 116* 82 121*   Lipid Profile: No results for input(s): CHOL, HDL, LDLCALC, TRIG, CHOLHDL, LDLDIRECT in the last 72 hours. Thyroid Function Tests: No results for input(s): TSH, T4TOTAL, FREET4, T3FREE, THYROIDAB in the last 72 hours. Anemia Panel: No results for input(s): VITAMINB12, FOLATE, FERRITIN, TIBC, IRON, RETICCTPCT in the last 72 hours.    Radiology Studies: I have reviewed  all of the imaging during this hospital visit personally     Scheduled Meds: . amLODipine  5 mg Oral Daily  . apixaban  5 mg Oral BID  . Chlorhexidine Gluconate Cloth  6 each Topical Daily  . gabapentin  400 mg Oral QHS  . levETIRAcetam  500 mg Oral BID  . mouth rinse  15 mL Mouth Rinse BID  . nicotine  21 mg Transdermal Daily  . polyethylene glycol  17 g Oral Once  . senna-docusate  2 tablet Oral BID   Continuous Infusions:   LOS: 14 days        Bonnell Public, MD

## 2020-05-24 DIAGNOSIS — I2609 Other pulmonary embolism with acute cor pulmonale: Secondary | ICD-10-CM | POA: Diagnosis not present

## 2020-05-24 NOTE — Progress Notes (Signed)
PROGRESS NOTE    Brian [name]  FIE:[id number] DOB: Jun 22, 1945 DOA: 05/09/2020 PCP: [facility]    Brief Narrative:  Brian [name] was admitted to the hospital with the working diagnosis of acute hypoxic respiratory failure due to bilateral pulmonary embolism,in the setting of left flank/groin cellulitis, complicated with severe sepsis, present on admission.  75 year old male past medical history for CVA with left-sided deficit, subdural hematoma, subarachnoid hemorrhage, hypertension, chronic pain syndrome, cocaine and tobacco abuse who presented with multiple falls. Patient reported becoming acutely dyspneic over the last 24 hours prior to hospitalization,at home he sustained a mechanical fall, EMS came to his home and helped back into his chair. Later same day EMS was called again, he was found hypotensive 71/48, he received 700 mL of saline intravenously and was transported to the hospital. On route he became unresponsive, requiring bag valve mask ventilation.At arrival to the ED his blood pressure was back up to 138/108, heart rate 110, temperature 97.9, respiratory rate 25, he was very poor hygiene, his lungs are clear to auscultation, heart S1-S2, present, rhythmic, his abdomen was soft, no lower extremity edema, left leg with erythema extending to the left groin, he was alert and oriented, decreased strength on the left side.  Sodium 136, potassium 5.2, chloride 103, bicarb 18, glucose 134, BUN 30, creatinine 2.0,,high sensitive troponin to [phone]. White count 11.6, hemoglobin 12.1, hematocrit 38.5, platelets 171. SARS COVID-19 negative. Urinalysis specific gravity 1.019, 0-5 white cells, 0-5 red cells. Toxicology positive for cocaine. Chest radiograph no infiltrates. 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  CT chest positive for acute bilateral pulmonary emboli, diffuse right lung and segmental involvement. Lesser involvement in the left lung. Positive emphysema.  EKG 104 bpm, normal axis, normal intervals, sinus rhythm, ST depression lead II, lead III, aVF, V4-V6, no significant T wave changes.  Patient was admitted to the intensive care unit, patient was placed on anticoagulation with IV heparin and IV antibiotic therapy.  Further work-up with echocardiography showed reduced RV systolic function. Due to history of intracranial bleed TPA was not given. Lower extremity Dopplers positive for deep vein thrombosis bilaterally.  During his hospitalization he developed encephalopathy, with supportive medical therapy and has improved.  EEG with no seizures.   Patient continued to be very weak and deconditioned, currently waiting to be transferred to a skilled nursing facility.  05/24/2020: Patient seen.  No new changes.  Patient has no new complaints.  Patient is more communicative today.  Awaiting SNF placement.   Assessment & Plan:   Principal Problem:   Pulmonary emboli (HCC) Active Problems:   Renal failure, acute (HCC)   Pressure injury of skin   Acute metabolic encephalopathy   Cellulitis    1. Acute bilateral pulmonary embolism, bilateral lower extremities DVT. Patient continue hemodynamcaly stable and with good oxygenation (100% on room air), no chest pain or dyspnea, very weak and deconditioned.   Tolerating well anticoagulation with apixaban.   2. Acute metabolic encephalopathy/ in the setting of hx of CVA/ intracranial bleeding (SAH/SDA). Hx of seizures  Patient is awake and alert, continue to be very weak and deconditioned, he is asking to go home, but due to his deconditioning is not safe.  On oxycodone and gabapentin for pain control with good toleration.   Continue with keppra, no active seizures.   3. Left flank cellulitis, complicated with  severe sepsis (end organ failure hypotension), present on admission. Completed antibiotic therapy.  4. Chronic diastolic heart failure/ HTN. No signs of acute exacerbation. Elevated troponin likely demand ischemia in the setting of sepsis and pulmonary embolism, no ACS.  Continue with amlodipine for blood pressure control.   5. AKI. On 03/20 serum cr at 1,0. Patient tolerating po well.   6. Chronic pain syndrome. Hx of polysubstance abuse. No signs of acute withdrawal symptoms. Pain is well controlled.   7. Stage 1 sacrum pressure ulcer not present on admission. Local skin care per protocol.    Status is: Inpatient  Remains inpatient appropriate because:Unsafe d/c plan   Dispo:  Patient From: Home  Planned Disposition: [place]  Medically stable for discharge: Yes     DVT prophylaxis: apixaban   Code Status:   full  Family Communication:   no family at the bedside      Nutrition Status:           Skin Documentation: Pressure Injury 05/10/20 Sacrum Left Stage 1 -  Intact skin with non-blanchable redness of a localized area usually over a bony prominence. (Active)  05/10/20 1500  Location: Sacrum  Location Orientation: Left  Staging: Stage 1 -  Intact skin with non-blanchable redness of a localized area usually over a bony prominence.  Wound Description (Comments):   Present on Admission: No      Subjective: No new complaints. No shortness of breath or chest pain. No fever or chills.  Objective: Vitals:   05/23/20 1048 05/23/20 1546 05/23/20 2113 05/24/20 0559  BP: 117/83 92/74 114/81 111/84  Pulse: 85 90 83 81  Resp: 20 16 16 16   Temp:  98.6 F (37 C) 97.7 F (36.5 C) (!) 97.5 F (36.4 C)  TempSrc:   Oral Oral  SpO2: 94% 100% 98% 97%  Weight:      Height:        Intake/Output Summary (Last 24 hours) at 05/24/2020 1431 Last data filed at 05/24/2020 0427 Gross per 24 hour  Intake --  Output 1280 ml  Net -1280 ml   Filed  Weights   05/16/20 0500 05/19/20 0500 05/21/20 0500  Weight: 67.4 kg 63.9 kg 64.4 kg    Examination:   General: deconditioned and ill looking appearing  Neurology: Awake and alert, non focal  E ENT: positive pallor, no icterus, oral mucosa moist Cardiovascular: No JVD. S1-S2 present, rhythmic, no gallops, rubs, or murmurs. Trace lower extremity edema. Pulmonary: positive breath sounds bilaterally, Gastrointestinal. Abdomen soft and non tender Skin. No rashes Musculoskeletal: no joint deformities     Data Reviewed: I have personally reviewed following labs and imaging studies  CBC: No results for input(s): WBC, NEUTROABS, HGB, HCT, MCV, PLT in the last 168 hours. Basic Metabolic Panel: No results for input(s): NA, K, CL, CO2, GLUCOSE, BUN, CREATININE, CALCIUM, MG, PHOS in the last 168 hours. GFR: Estimated Creatinine Clearance: 59.9 mL/min (by C-G formula based on SCr of 1 mg/dL). Liver Function Tests: No results for input(s): AST, ALT, ALKPHOS, BILITOT, PROT, ALBUMIN in the last 168 hours. No results for input(s): LIPASE, AMYLASE in the last 168 hours. No results for input(s): AMMONIA in the last 168 hours. Coagulation Profile: No results for input(s): INR, PROTIME in the last 168 hours. Cardiac Enzymes: No results for input(s): CKTOTAL, CKMB, CKMBINDEX, TROPONINI in the last 168 hours. BNP (last 3 results) No results for input(s): PROBNP in the last 8760 hours. HbA1C: No results for input(s): HGBA1C in  the last 72 hours. CBG: Recent Labs  Lab 05/19/20 2125 05/21/20 0713 05/21/20 1118  GLUCAP 116* 82 121*   Lipid Profile: No results for input(s): CHOL, HDL, LDLCALC, TRIG, CHOLHDL, LDLDIRECT in the last 72 hours. Thyroid Function Tests: No results for input(s): TSH, T4TOTAL, FREET4, T3FREE, THYROIDAB in the last 72 hours. Anemia Panel: No results for input(s): VITAMINB12, FOLATE, FERRITIN, TIBC, IRON, RETICCTPCT in the last 72 hours.    Radiology Studies: I  have reviewed all of the imaging during this hospital visit personally     Scheduled Meds: . amLODipine  5 mg Oral Daily  . apixaban  5 mg Oral BID  . Chlorhexidine Gluconate Cloth  6 each Topical Daily  . gabapentin  400 mg Oral QHS  . levETIRAcetam  500 mg Oral BID  . mouth rinse  15 mL Mouth Rinse BID  . nicotine  21 mg Transdermal Daily  . polyethylene glycol  17 g Oral Once  . senna-docusate  2 tablet Oral BID   Continuous Infusions:   LOS: 15 days        Bonnell Public, MD

## 2020-05-25 DIAGNOSIS — L03314 Cellulitis of groin: Secondary | ICD-10-CM | POA: Diagnosis not present

## 2020-05-25 DIAGNOSIS — G9341 Metabolic encephalopathy: Secondary | ICD-10-CM | POA: Diagnosis not present

## 2020-05-25 DIAGNOSIS — I2609 Other pulmonary embolism with acute cor pulmonale: Secondary | ICD-10-CM | POA: Diagnosis not present

## 2020-05-25 DIAGNOSIS — I2699 Other pulmonary embolism without acute cor pulmonale: Secondary | ICD-10-CM | POA: Diagnosis not present

## 2020-05-25 LAB — COMPREHENSIVE METABOLIC PANEL
ALT: 14 U/L (ref 0–44)
AST: 16 U/L (ref 15–41)
Albumin: 3 g/dL — ABNORMAL LOW (ref 3.5–5.0)
Alkaline Phosphatase: 64 U/L (ref 38–126)
Anion gap: 9 (ref 5–15)
BUN: 24 mg/dL — ABNORMAL HIGH (ref 8–23)
CO2: 25 mmol/L (ref 22–32)
Calcium: 9.5 mg/dL (ref 8.9–10.3)
Chloride: 104 mmol/L (ref 98–111)
Creatinine, Ser: 1.17 mg/dL (ref 0.61–1.24)
GFR, Estimated: >60 mL/min (ref >60)
Glucose, Bld: 79 mg/dL (ref 70–99)
Potassium: 4.1 mmol/L (ref 3.5–5.1)
Sodium: 138 mmol/L (ref 135–145)
Total Bilirubin: 0.7 mg/dL (ref 0.3–1.2)
Total Protein: 9.1 g/dL — ABNORMAL HIGH (ref 6.5–8.1)

## 2020-05-25 LAB — CBC
HCT: 41.1 % (ref 39.0–52.0)
Hemoglobin: 12.8 g/dL — ABNORMAL LOW (ref 13.0–17.0)
MCH: 27.6 pg (ref 26.0–34.0)
MCHC: 31.1 g/dL (ref 30.0–36.0)
MCV: 88.6 fL (ref 80.0–100.0)
Platelets: 245 10*3/uL (ref 150–400)
RBC: 4.64 MIL/uL (ref 4.22–5.81)
RDW: 13.7 % (ref 11.5–15.5)
WBC: 5.5 10*3/uL (ref 4.0–10.5)
nRBC: 0 % (ref 0.0–0.2)

## 2020-05-25 NOTE — Plan of Care (Signed)

## 2020-05-25 NOTE — Progress Notes (Addendum)
PROGRESS NOTE        PATIENT DETAILS Name: Brian [name] Age: 75 y.o. Sex: male Date of Birth: 05/21/1945 Admit Date: 05/09/2020 Admitting Physician Audria Nine, DO EXN:[id number], [facility]  Brief Narrative: Patient is a 75 y.o. 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  Significant events: 3/19>> admit to ICU-PE-hypotension-AKI-sepsis-left groin/flank cellulitis. 3/21>> transfer to [facility]  Significant studies: 3/19>> CTA chest: Bilateral PE 3/19>> bilateral lower extremity Doppler: Age indeterminate bilateral DVT 3/19>> CT head: No acute intracranial abnormality 3/19>> CT C-spine: No acute traumatic injury 3/19>> CT abdomen/pelvis: Stranding of fat in left flank, diverticulosis 3/19>> Echo: EF 65-70%, RVSP 45 mmHg 3/19>> EEG: Focal cerebral dysfunction in the right cerebral hemisphere-no seizures identified. 3/19>> UDS: Positive for cocaine  Antimicrobial therapy: Cefdinir: 3/21>> 3/25 Vancomycin: 3/19>> 3/20 Cefepime: 3/19 x 1  Microbiology data: 3/19>> blood culture: No growth  Procedures : None  Consults: PCCM  DVT Prophylaxis : apixaban (ELIQUIS) tablet 5 mg    Subjective: Completely awake/alert-adamant on going home-does not want to go to SNF.  Wants to try rehab at home.  Claims he will have friend/family at his home almost 24/7.   Assessment/Plan: Acute hypoxic respiratory failure due to lobar/segmental PE: Hypoxia has resolved-initially on IV heparin-has been transitioned to Eliquis.  Bilateral lower extremity age indeterminate DVT: On anticoagulation.  Sepsis due to left  flank/groin cellulitis: Sepsis physiology resolved-cultures negative-completed a course of antimicrobial therapy.  Acute metabolic encephalopathy: In the setting of sepsis/hypoxia-resolved.  Completely awake and alert.  History of CVA with chronic left-sided deficits, history of SAH/SDH  Seizure disorder: Continue Keppra.  Elevated troponins due to demand ischemia: No anginal symptoms-suspected demand ischemia due to hypoxia/PE-also with cocaine use.  Echo with preserved EF and no regional wall motion abnormality.  Doubt further work-up required.  AKI: Likely hemodynamically mediated-has resolved  HTN: BP stable-continue amlodipine  Chronic pain syndrome: Stable-on Neurontin/Percocet (home regimen)  Cocaine abuse: Has been counseled  Tobacco abuse: Continue transdermal nicotine  Deconditioning/debility: Due to acute illness-refusing SNF-wants to go home with home health services.  Patient claims that he will have caregivers that he can arrange.  Have discussed with social worker-who will try and get in touch with his caregiver/family and will decide how to proceed accordingly.   Patient is aware of our recommendations for SNF-understands risk of catastrophic fall causing life-threatening//disabling issues while on anticoagulation-but claims that he has fallen multiple times in the past while on anticoagulation.  He accepts risks-and just wants to go home  Stage 1 sacrum pressure ulcer not present on admission   Diet: Diet Order            Diet heart healthy/carb modified Room service appropriate? Yes; Fluid consistency: Thin  Diet effective now                  Code Status: Full code   Family Communication: Spoke with daughter-[name]-[phone]-over phone on 4/4-is aware of the patients decision to go home rather than SNF  Disposition Plan: Status is: Inpatient  Remains inpatient appropriate because:Inpatient level  of care appropriate due to severity of  illness   Dispo:  Patient From: Home  Planned Disposition: [place] vs Home with home health  Medically stable for discharge: Yes     Barriers to Discharge: Awaiting safe disposition-social worker to reach out to family/caregivers to see if going home is even feasible.  If family/caregivers can provide care-potential discharge on 4/5 with maximal home health services.  Antimicrobial agents: Anti-infectives (From admission, onward)   Start     Dose/Rate Route Frequency Ordered Stop   05/11/20 1030  cefdinir (OMNICEF) capsule 300 mg        300 mg Oral Every 12 hours 05/11/20 0937 05/15/20 2100   05/10/20 1000  ceFAZolin (ANCEF) IVPB 2g/100 mL premix  Status:  Discontinued        2 g 200 mL/hr over 30 Minutes Intravenous Every 8 hours 05/10/20 0905 05/11/20 0937   05/10/20 0800  vancomycin (VANCOREADY) IVPB 750 mg/150 mL  Status:  Discontinued        750 mg 150 mL/hr over 60 Minutes Intravenous Every 24 hours 05/09/20 0833 05/10/20 0859   05/09/20 1000  ceFEPIme (MAXIPIME) 2 g in sodium chloride 0.9 % 100 mL IVPB  Status:  Discontinued        2 g 200 mL/hr over 30 Minutes Intravenous Every 12 hours 05/09/20 0833 05/10/20 0859   05/09/20 0845  vancomycin (VANCOREADY) IVPB 1500 mg/300 mL        1,500 mg 150 mL/hr over 120 Minutes Intravenous  Once 05/09/20 5027 05/09/20 1220       Time spent: 25- minutes-Greater than 50% of this time was spent in counseling, explanation of diagnosis, planning of further management, and coordination of care.  MEDICATIONS: Scheduled Meds: . amLODipine  5 mg Oral Daily  . apixaban  5 mg Oral BID  . Chlorhexidine Gluconate Cloth  6 each Topical Daily  . gabapentin  400 mg Oral QHS  . levETIRAcetam  500 mg Oral BID  . mouth rinse  15 mL Mouth Rinse BID  . nicotine  21 mg Transdermal Daily  . polyethylene glycol  17 g Oral Once  . senna-docusate  2 tablet Oral BID   Continuous Infusions: PRN Meds:.docusate sodium,  oxyCODONE-acetaminophen **AND** [DISCONTINUED] oxyCODONE, polyethylene glycol   PHYSICAL EXAM: Vital signs: Vitals:   05/24/20 0559 05/24/20 1623 05/24/20 2146 05/25/20 0621  BP: 111/84 (!) 134/94 122/85 116/86  Pulse: 81 95 88 83  Resp: 16 17 16 16   Temp: (!) 97.5 F (36.4 C) 98.2 F (36.8 C) 97.6 F (36.4 C) 97.8 F (36.6 C)  TempSrc: Oral Oral Oral   SpO2: 97% 97% 98% 100%  Weight:      Height:       Filed Weights   05/16/20 0500 05/19/20 0500 05/21/20 0500  Weight: 67.4 kg 63.9 kg 64.4 kg   Body mass index is 22.24 kg/m.   Gen Exam:Alert awake-not in any distress HEENT:atraumatic, normocephalic Chest: B/L clear to auscultation anteriorly CVS:S1S2 regular Abdomen:soft non tender, non distended Extremities:no edema Skin: no rash  I have personally reviewed following labs and imaging studies  LABORATORY DATA: CBC: Recent Labs  Lab 05/25/20 0841  WBC 5.5  HGB 12.8*  HCT 41.1  MCV 88.6  PLT 741    Basic Metabolic Panel: Recent Labs  Lab 05/25/20 0841  NA 138  K 4.1  CL 104  CO2 25  GLUCOSE 79  BUN 24*  CREATININE 1.17  CALCIUM 9.5    GFR:  Estimated Creatinine Clearance: 51.2 mL/min (by C-G formula based on SCr of 1.17 mg/dL).  Liver Function Tests: Recent Labs  Lab 05/25/20 0841  AST 16  ALT 14  ALKPHOS 64  BILITOT 0.7  PROT 9.1*  ALBUMIN 3.0*   No results for input(s): LIPASE, AMYLASE in the last 168 hours. No results for input(s): AMMONIA in the last 168 hours.  Coagulation Profile: No results for input(s): INR, PROTIME in the last 168 hours.  Cardiac Enzymes: No results for input(s): CKTOTAL, CKMB, CKMBINDEX, TROPONINI in the last 168 hours.  BNP (last 3 results) No results for input(s): PROBNP in the last 8760 hours.  Lipid Profile: No results for input(s): CHOL, HDL, LDLCALC, TRIG, CHOLHDL, LDLDIRECT in the last 72 hours.  Thyroid Function Tests: No results for input(s): TSH, T4TOTAL, FREET4, T3FREE, THYROIDAB in the  last 72 hours.  Anemia Panel: No results for input(s): VITAMINB12, FOLATE, FERRITIN, TIBC, IRON, RETICCTPCT in the last 72 hours.  Urine analysis:    Component Value Date/Time   COLORURINE YELLOW 05/09/2020 0833   APPEARANCEUR CLEAR 05/09/2020 0833   LABSPEC 1.019 05/09/2020 0833   PHURINE 5.0 05/09/2020 0833   GLUCOSEU NEGATIVE 05/09/2020 0833   HGBUR SMALL (A) 05/09/2020 0833   BILIRUBINUR NEGATIVE 05/09/2020 0833   KETONESUR NEGATIVE 05/09/2020 0833   PROTEINUR NEGATIVE 05/09/2020 0833   UROBILINOGEN 0.2 05/20/2011 1327   NITRITE NEGATIVE 05/09/2020 0833   LEUKOCYTESUR NEGATIVE 05/09/2020 0833    Sepsis Labs: Lactic Acid, Venous    Component Value Date/Time   LATICACIDVEN 2.7 (HH) 05/09/2020 0654    MICROBIOLOGY: Recent Results (from the past 240 hour(s))  SARS CORONAVIRUS 2 (TAT 6-24 HRS) Nasopharyngeal Nasopharyngeal Swab     Status: None   Collection Time: 05/20/20 10:48 AM   Specimen: Nasopharyngeal Swab  Result Value Ref Range Status   SARS Coronavirus 2 NEGATIVE NEGATIVE Final    Comment: (NOTE) SARS-CoV-2 target nucleic acids are NOT DETECTED.  The SARS-CoV-2 RNA is generally detectable in upper and lower respiratory specimens during the acute phase of infection. Negative results do not preclude SARS-CoV-2 infection, do not rule out co-infections with other pathogens, and should not be used as the sole basis for treatment or other patient management decisions. Negative results must be combined with clinical observations, patient history, and epidemiological information. The expected result is Negative.  Fact Sheet for Patients: SugarRoll.be  Fact Sheet for Healthcare Providers: https://www.woods-mathews.com/  This test is not yet approved or cleared by the [place] FDA and  has been authorized for detection and/or diagnosis of SARS-CoV-2 by FDA under an Emergency Use Authorization (EUA). This EUA will  remain  in effect (meaning this test can be used) for the duration of the COVID-19 declaration under Se ction 564(b)(1) of the Act, 21 U.S.C. section 360bbb-3(b)(1), unless the authorization is terminated or revoked sooner.  Performed at [facility], [place] [address], [place], [place] [address]     RADIOLOGY STUDIES/RESULTS: No results found.   LOS: 16 days   Oren Binet, MD  [facility]    To contact the attending provider between 7A-7P or the covering provider during after hours 7P-7A, please log into the web site [email or web address] and access using universal [place] password for that web site. If you do not have the password, please call the hospital operator.  05/25/2020, 10:42 AM

## 2020-05-25 NOTE — Progress Notes (Signed)
Occupational Therapy Treatment Patient Details Name: Brian [name] MRN: [id number] DOB: 1945-04-09 Today's Date: 05/25/2020    History of present illness 75 year old gentleman admitted with multiple falls and somnolence.Pt  states he might have taken double dose of tramadol along with gabapentin. On arrival to ED, pt hypotensive with blood pressure of 71/48.  Resuscitated with fluid.  Found to have AKI, bilateral PE.   PMH:  active cocaine use, smoker, history of a stroke with left-sided hemiparesis, brain aneurysm on anticoagulation and bleeding on 1/21, spontaneous subdural hematoma on 2/21 status post burr hole and evacuation, seizure disorders   OT comments  Pt making progress with functional goals. Pt still with Poor insight into deficits and wants to go home vs ST reahb at SNF. OT will continue to follow acutely to maximize level of function and safety  Follow Up Recommendations  SNF;Supervision/Assistance - 24 hour    Equipment Recommendations  3 in 1 bedside commode    Recommendations for Other Services      Precautions / Restrictions Precautions Precautions: Fall Restrictions Weight Bearing Restrictions: No       Mobility Bed Mobility Overal bed mobility: Needs Assistance Bed Mobility: Sit to Supine     Supine to sit: Mod assist     General bed mobility comments: assist to lift LEs onto the bed, min A to maintain sitting balance due to posterior lean    Transfers Overall transfer level: Needs assistance Equipment used: 1 person hand held assist Transfers: Sit to/from Stand;Stand Pivot Transfers Sit to Stand: Min assist Stand pivot transfers: Min assist            Balance Overall balance assessment: Needs assistance Sitting-balance support: No upper extremity supported;Feet supported Sitting balance-Leahy Scale: Fair Sitting balance - Comments: min A for sitting balance seated EOB, posterior lean   Standing balance support: Single extremity  supported;During functional activity Standing balance-Leahy Scale: Poor                             ADL either performed or assessed with clinical judgement   ADL Overall ADL's : Needs assistance/impaired     Grooming: Wash/dry hands;Wash/dry face;Sitting;Min guard   Upper Body Bathing: Min guard;Sitting       Upper Body Dressing : Min guard;Sitting       Toilet Transfer: Minimal assistance;Stand-pivot;Cueing for safety;Cueing for sequencing   Toileting- Clothing Manipulation and Hygiene: Maximal assistance;Sit to/from stand       Functional mobility during ADLs: Minimal assistance;Cueing for safety       Vision Baseline Vision/History: No visual deficits Patient Visual Report: No change from baseline     Perception     Praxis      Cognition Arousal/Alertness: Awake/alert Behavior During Therapy: WFL for tasks assessed/performed;Impulsive Overall Cognitive Status: No family/caregiver present to determine baseline cognitive functioning Area of Impairment: Memory;Following commands;Safety/judgement;Awareness;Problem solving                     Memory: Decreased short-term memory Following Commands: Follows one step commands with increased time Safety/Judgement: Decreased awareness of safety;Decreased awareness of deficits   Problem Solving: Requires verbal cues;Requires tactile cues General Comments: No family present to provide insight into pt's baseline.  He is hyperverbose        Exercises     Shoulder Instructions       General Comments      Pertinent Vitals/ Pain  Pain Assessment: Faces Faces Pain Scale: Hurts little more Pain Location: back Pain Descriptors / Indicators: Aching Pain Intervention(s): Monitored during session;Repositioned  Home Living                                          Prior Functioning/Environment              Frequency  Min 2X/week        Progress Toward  Goals  OT Goals(current goals can now be found in the care plan section)  Progress towards OT goals: Progressing toward goals  Acute Rehab OT Goals Patient Stated Goal: to get out of here  Plan Discharge plan remains appropriate    Co-evaluation                 AM-PAC OT "6 Clicks" Daily Activity     Outcome Measure   Help from another person eating meals?: A Little Help from another person taking care of personal grooming?: A Little Help from another person toileting, which includes using toliet, bedpan, or urinal?: A Lot Help from another person bathing (including washing, rinsing, drying)?: A Little Help from another person to put on and taking off regular upper body clothing?: A Little Help from another person to put on and taking off regular lower body clothing?: A Lot 6 Click Score: 16    End of Session Equipment Utilized During Treatment: Gait belt  OT Visit Diagnosis: Unsteadiness on feet (R26.81);Muscle weakness (generalized) (M62.81);Other symptoms and signs involving cognitive function   Activity Tolerance Patient tolerated treatment well   Patient Left with call bell/phone within reach;in chair;with chair alarm set;with nursing/sitter in room   Nurse Communication          Time: 2119-4174 OT Time Calculation (min): 24 min  Charges: OT General Charges $OT Visit: 1 Visit OT Treatments $Self Care/Home Management : 8-22 mins $Therapeutic Activity: 8-22 mins     Britt Bottom 05/25/2020, 2:24 PM

## 2020-05-25 NOTE — TOC Progression Note (Addendum)
Transition of Care [facility]) - Progression Note    Patient Details  Name: Brian [name] MRN: [id number] Date of Birth: August 11, 1945  Transition of Care [facility]) CM/SW Contact  Joanne Chars, LCSW Phone Number: 05/25/2020, 1:32 PM  Clinical Narrative:    0830:  CSW and MD met with pt regarding pt desire to discharge home rather than to SNF.  Pt confirms this decision, provides name of his aide, Fabio Pierce, [phone].  CSW LM with [facility] to confirm she will restart services.  CSW received text from [place]: no update on SNF approval and she will be calling them again today for update.     CSW spoke with pt daughter Denman George and informed her.  She said she still thinks SNF is best and is going to try to talk her dad into waiting for SNF.  She was able to provide name of the company that provides the aide: [facility].[phone]. She is aware that MD planning to discharge tomorrow.  1200:  CSW received phone call from [place] care.  She can resume services tomorrow.  She goes to the home twice daily for 4 hour blocks, once in the AM and once in the evening.    [facility] declines for [facility].  [facility] reviewing.           Expected Discharge Plan and Services           Expected Discharge Date: 05/16/20                                     Social Determinants of Health (SDOH) Interventions    Readmission Risk Interventions Readmission Risk Prevention Plan 04/03/2019 06/29/2018  Post Dischage Appt - Complete  Medication Screening - Complete  Transportation Screening Complete Complete  PCP or Specialist Appt within 5-7 Days Complete -  Home Care Screening Complete -  Medication Review (RN CM) Complete -  Some recent data might be hidden

## 2020-05-26 ENCOUNTER — Other Ambulatory Visit (HOSPITAL_COMMUNITY): Payer: Self-pay

## 2020-05-26 DIAGNOSIS — L03314 Cellulitis of groin: Secondary | ICD-10-CM | POA: Diagnosis not present

## 2020-05-26 DIAGNOSIS — N179 Acute kidney failure, unspecified: Secondary | ICD-10-CM | POA: Diagnosis not present

## 2020-05-26 DIAGNOSIS — G9341 Metabolic encephalopathy: Secondary | ICD-10-CM | POA: Diagnosis not present

## 2020-05-26 DIAGNOSIS — I2609 Other pulmonary embolism with acute cor pulmonale: Secondary | ICD-10-CM | POA: Diagnosis not present

## 2020-05-26 MED ORDER — AMLODIPINE BESYLATE 5 MG PO TABS
5.0000 mg | ORAL_TABLET | Freq: Every day | ORAL | 0 refills | Status: AC
  Filled 2020-05-26: qty 30, 30d supply, fill #0

## 2020-05-26 MED ORDER — GABAPENTIN 800 MG PO TABS
400.0000 mg | ORAL_TABLET | Freq: Every day | ORAL | 0 refills | Status: AC
  Filled 2020-05-26: qty 30, 60d supply, fill #0

## 2020-05-26 MED ORDER — OXYCODONE-ACETAMINOPHEN 5-325 MG PO TABS: 1.0000 | ORAL_TABLET | Freq: Three times a day (TID) | ORAL | 0 refills | Status: DC | PRN

## 2020-05-26 MED ORDER — LEVETIRACETAM 500 MG PO TABS
500.0000 mg | ORAL_TABLET | Freq: Two times a day (BID) | ORAL | 0 refills | Status: AC
  Filled 2020-05-26: qty 60, 30d supply, fill #0

## 2020-05-26 MED ORDER — APIXABAN 5 MG PO TABS
5.0000 mg | ORAL_TABLET | Freq: Two times a day (BID) | ORAL | 0 refills | Status: AC
  Filled 2020-05-26: qty 60, 30d supply, fill #0

## 2020-05-26 MED ORDER — OXYCODONE-ACETAMINOPHEN 5-325 MG PO TABS: 1.0000 | ORAL_TABLET | Freq: Three times a day (TID) | ORAL | 0 refills | Status: AC | PRN

## 2020-05-26 MED ORDER — AMITRIPTYLINE HCL 25 MG PO TABS
25.0000 mg | ORAL_TABLET | Freq: Every day | ORAL | 0 refills | Status: AC
  Filled 2020-05-26: qty 30, 30d supply, fill #0

## 2020-05-26 NOTE — Plan of Care (Signed)
  Problem: Education: Goal: Knowledge of General Education information will improve Description: Including pain rating scale, medication(s)/side effects and non-pharmacologic comfort measures Outcome: Progressing   Problem: Health Behavior/Discharge Planning: Goal: Ability to manage health-related needs will improve Outcome: Progressing   Problem: Clinical Measurements: Goal: Ability to maintain clinical measurements within normal limits will improve Outcome: Progressing Goal: Will remain free from infection Outcome: Progressing Goal: Diagnostic test results will improve Outcome: Progressing Goal: Respiratory complications will improve Outcome: Progressing Goal: Cardiovascular complication will be avoided Outcome: Progressing   Problem: Activity: Goal: Risk for activity intolerance will decrease Outcome: Progressing   Problem: Nutrition: Goal: Adequate nutrition will be maintained Outcome: Progressing   Problem: Coping: Goal: Level of anxiety will decrease Outcome: Progressing   Problem: Elimination: Goal: Will not experience complications related to bowel motility Outcome: Progressing Goal: Will not experience complications related to urinary retention Outcome: Progressing   Problem: Pain Managment: Goal: General experience of comfort will improve Outcome: Progressing   Problem: Safety: Goal: Ability to remain free from injury will improve Outcome: Progressing   Problem: Skin Integrity: Goal: Risk for impaired skin integrity will decrease Outcome: Progressing   Problem: Education: Goal: Expressions of having a comfortable level of knowledge regarding the disease process will increase Outcome: Progressing   Problem: Coping: Goal: Ability to adjust to condition or change in health will improve Outcome: Progressing Goal: Ability to identify appropriate support needs will improve Outcome: Progressing   Problem: Health Behavior/Discharge Planning: Goal:  Compliance with prescribed medication regimen will improve Outcome: Progressing   Problem: Clinical Measurements: Goal: Complications related to the disease process, condition or treatment will be avoided or minimized Outcome: Progressing Goal: Diagnostic test results will improve Outcome: Progressing   Problem: Safety: Goal: Verbalization of understanding the information provided will improve Outcome: Progressing   Problem: Self-Concept: Goal: Level of anxiety will decrease Outcome: Progressing Goal: Ability to verbalize feelings about condition will improve Outcome: Progressing

## 2020-05-26 NOTE — Progress Notes (Signed)
Physical Therapy Treatment Patient Details Name: Brian [name] MRN: [id number] DOB: 1945-04-06 Today's Date: 05/26/2020    History of Present Illness 75 year old gentleman admitted with multiple falls and somnolence.Pt  states he might have taken double dose of tramadol along with gabapentin. On arrival to ED, pt hypotensive with blood pressure of 71/48.  Resuscitated with fluid.  Found to have AKI, bilateral PE.   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 Re-assessed for home needs and pt agrees he will be safest with use of wheelchair. He is refusing BSC despite admitting that wheelchair will likely NOT go into his bathroom. He anticipates he will walk into bathroom with countertop and quad cane. Advised pt not to do this on his own, but only when aide or family present. Pt inquired about rollator and ultimately agreed that he is too unsteady to safely use. Given choice between wheelchair and RW (insurance will only cover one), he agreed to wheelchair. Agreed to [place]. Communicated with Marya Amsler, LCSW.   Follow Up Recommendations  Supervision/Assistance - 24 hour;Home health PT (refused SNF)     Equipment Recommendations  Wheelchair (measurements PT);Wheelchair cushion (measurements PT) (18"x18"; pt refuses BSC/3n1)    Recommendations for Other Services       Precautions / Restrictions Precautions Precautions: Fall    Mobility  Bed Mobility Overal bed mobility: Needs Assistance Bed Mobility: Supine to Sit     Supine to sit: Supervision     General bed mobility comments: pt reports has hospital bed at home with rails; HOB 15 and rail with supervision only (no imbalance)    Transfers Overall transfer level: Needs  assistance Equipment used: None Transfers: Sit to/from [facility] Sit to Stand: Min guard Stand pivot transfers: Min guard       General transfer comment: assist for safety; no imbalance; correct use of armrests or RW during each transfer  Ambulation/Gait Ambulation/Gait assistance: Mod assist Gait Distance (Feet): 12 Feet Assistive device: Rolling walker (2 wheeled) Gait Pattern/deviations: Scissoring;Trunk flexed;Drifts right/left;Decreased stride length     General Gait Details: shuffling gait with LLE crossing/scissoring consistently and difficulty leaving room to advance RLE through narrow space; increased forward flexed posture and rt lean, decreased safety and coordination wtih use of RW, 1 LOB requirng mod A to regain balance   Theme park manager mobility:  (discussed use of wheelchair and pt reports he has used one previously and agrees he can use inside his home. Not sure it will fit in his bathroom, but refusing BSC. States he can walk with quad cane and counter top into bathroom. Advised not to)  Modified Rankin (Stroke Patients Only)       Balance Overall balance assessment: Needs assistance Sitting-balance support: No upper extremity supported;Feet supported Sitting balance-Leahy Scale: Fair     Standing balance support: Single extremity supported;During functional activity Standing balance-Leahy Scale: Poor                              Cognition Arousal/Alertness: Awake/alert Behavior During Therapy: WFL for tasks assessed/performed;Impulsive Overall Cognitive Status: No family/caregiver present to determine baseline cognitive functioning  Area of Impairment: Safety/judgement;Awareness;Problem solving                       Following Commands: Follows one step commands with increased time Safety/Judgement: Decreased awareness of safety;Decreased awareness of  deficits   Problem Solving: Requires verbal cues;Requires tactile cues        Exercises      General Comments        Pertinent Vitals/Pain Pain Assessment: 0-10 Pain Score: 0-No pain    Home Living Family/patient expects to be discharged to:: Private residence Living Arrangements: Non-relatives/Friends Available Help at Discharge: Family;Personal care attendant (aide 8 hrs/day (4hrs a.m; 4 hrs pm)) Type of Home: House Home Access: Level entry   Home Layout: One level Home Equipment: Shower seat;Grab bars - tub/shower;Cane - quad;Wheelchair - power;Hospital bed Additional Comments: states his power chair is in storage; family may be able to help him get it    Prior Function Level of Independence: Independent with assistive device(s);Needs assistance  Gait / Transfers Assistance Needed: states he used RW vs cane at home with and without assist ADL's / Homemaking Assistance Needed: aide assist with ADLs, son and daughter assist pt at night     PT Goals (current goals can now be found in the care plan section) Acute Rehab PT Goals Patient Stated Goal: to get out of here Time For Goal Achievement: 05/25/20 Potential to Achieve Goals: Good Progress towards PT goals: Progressing toward goals (discharging today, therefore no goal update done)    Frequency    Min 2X/week      PT Plan Discharge plan needs to be updated    Co-evaluation              AM-PAC PT "6 Clicks" Mobility   Outcome Measure  Help needed turning from your back to your side while in a flat bed without using bedrails?: None Help needed moving from lying on your back to sitting on the side of a flat bed without using bedrails?: A Little Help needed moving to and from a bed to a chair (including a wheelchair)?: A Little Help needed standing up from a chair using your arms (e.g., wheelchair or bedside chair)?: A Little Help needed to walk in hospital room?: A Lot Help needed climbing 3-5 steps  with a railing? : Total 6 Click Score: 16    End of Session Equipment Utilized During Treatment: Gait belt Activity Tolerance: Patient tolerated treatment well Patient left: in chair;with call bell/phone within reach;with chair alarm set Nurse Communication: Mobility status;Other (comment) (condom catheter off on arrival) PT Visit Diagnosis: Unsteadiness on feet (R26.81);Muscle weakness (generalized) (M62.81)     Time: 9675-9163 PT Time Calculation (min) (ACUTE ONLY): 22 min  Charges:  $Gait Training: 8-22 mins                      [name], PT Pager [phone]    [name] 05/26/2020, 9:39 AM

## 2020-05-26 NOTE — Care Management Important Message (Signed)
Important Message  Patient Details  Name: Brian [name] MRN: [id number] Date of Birth: 1945-03-14   Medicare Important Message Given:  Yes     Orbie Pyo 05/26/2020, 1:22 PM

## 2020-05-26 NOTE — TOC Progression Note (Addendum)
Transition of Care [facility]) - Progression Note    Patient Details  Name: Brian [name] MRN: [id number] Date of Birth: 1945-11-05  Transition of Care [facility]) CM/SW Contact  Joanne Chars, LCSW Phone Number: 05/26/2020, 10:24 AM  Clinical Narrative:  0830: [facility] accepts pt.  They do not have current CSW or aide, can provide PT/OT and OT has been handling aid tasks.  CSW met with pt and with [facility] PT regarding equipment needs: discussed walker vs wheelchair.  Pt wants wheelchair, which was ordered.    1000: Message from [employer].  Pt stating he "does not have a home to go to" and has changed his mind after talking to daughter, wants to go to SNF.  CSW spoke with pt, clarified what his housing is.  Pt does have a home, which is where the aide has been providing services.  He has been allowing his son to stay there, but it is in pts name.  Pt confirmed that he spoke to his daughter, now in agreement that he will go to SNF "for a week."  MD informed.  [employer] called to put wheelchair order on hold.   1020:  CSW spoke with Helene Kelp at [employer].  She has not been able to get any update from [employer] this week.  She will call again today and request update.  She currently has three potential admits awaiting auth from [facility].              Expected Discharge Plan and Services           Expected Discharge Date: 05/26/20                         HH Arranged: PT,OT,Social Work [place]: [place] Date [place]: 05/25/20 Time [place]: 1600 Representative spoke with at [place]: [place] ([place]) Interventions    Readmission Risk Interventions Readmission Risk Prevention Plan 04/03/2019 06/29/2018  Post Dischage Appt - Complete  Medication Screening - Complete  Transportation Screening Complete Complete  PCP or Specialist Appt within 5-7 Days Complete -  Home Care Screening Complete -  Medication Review (RN  CM) Complete -  Some recent data might be hidden

## 2020-05-26 NOTE — Progress Notes (Signed)
Patient seen and examined this morning-he continues to refuse SNF placement-and indicates that he wants to go home.  I have again made him aware of our recommendations to go to SNF-he is aware of the risk of catastrophic falls at home.  I have ordered home health-I will discuss with social work-and plan on discharging home later today.
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
## 2020-05-27 DIAGNOSIS — I2609 Other pulmonary embolism with acute cor pulmonale: Secondary | ICD-10-CM | POA: Diagnosis not present

## 2020-05-27 NOTE — Progress Notes (Signed)
Physical Therapy Treatment Patient Details Name: Brian [name] MRN: [id number] DOB: [birth year] Today's Date: 05/27/2020    History of Present Illness 75 year old gentleman admitted with multiple falls and somnolence.Pt  states he might have taken double dose of tramadol along with gabapentin. On arrival to ED, pt hypotensive with blood pressure of 71/48.  Resuscitated with fluid.  Found to have AKI, bilateral PE.   PMH:  active cocaine use, smoker, history of a stroke with left-sided hemiparesis, brain aneurysm on anticoagulation and bleeding on 1/21, spontaneous subdural hematoma on 2/21 status post burr hole and evacuation, seizure disorders    PT Comments    Discharge to home cancelled. Took wheelchair to room to practice for potential use in his home. Patient able to transfer in/out of chair AFTER set-up with min guard assist without imbalance. Patient required max cues for manipulating brakes on either side and assist with left foot rest. Instructed to use RLE and bil UEs for maneuvering wheelchair and required incr time and effort to propel 20 ft (difficulty coordinating with RLE and LUE weakness a factor). Session shortened/ended due to pt reporting he was suddenly not feeling well and had not had anything to eat today. After returned to room, assisted pt to call to order his breakfast.   Do not feel home at wheelchair level is a safe discharge plan for this patient as he cannot manipulate w/c parts or push wheelchair for any significant distance at this time. Will continue to follow with new goals set.      Follow Up Recommendations  Supervision/Assistance - 24 hour;SNF (if pt refuses SNF, then max [facility] services; currently unsafe at wheelchair level to be at home)     Equipment Recommendations  Wheelchair (measurements PT);Wheelchair cushion (measurements PT) (18"x18"; pt refuses BSC/3n1)    Recommendations for Other Services       Precautions / Restrictions  Precautions Precautions: Fall    Mobility  Bed Mobility Overal bed mobility: Needs Assistance Bed Mobility: Supine to Sit;Sit to Supine     Supine to sit: Supervision (with rail) Sit to supine: Min assist   General bed mobility comments: pt reports has hospital bed at home with rails; HOB 15 and rail with supervision only (no imbalance);not feeling well by end of session and unable to get his legs up onto bed without assistance    Transfers Overall transfer level: Needs assistance Equipment used: None Transfers: Stand Pivot Transfers   Stand pivot transfers: Min guard       General transfer comment: assist for safety; no imbalance; correct use of armrests with transfer to his right side each time.  Ambulation/Gait                 Theme park manager mobility: Yes Wheelchair propulsion: Both upper extremities;Right lower extremity Wheelchair parts: Needs assistance Distance: 20 Wheelchair Assistance Details (indicate cue type and reason): assistance managing left foot rest and cues for managing brakes; cues for turning in tight space in room; after 20 ft requested a rest and then began to report he was not feeling well and had not eaten breakfast that was brought; rested and pt unable to tolerate propelling himself back into room and pushed by PT. Pointed out concerns re: pt using a wheelchair in his home and he stated he would mostly be walking and holding onto furniture as he has done previously. Again explained incr fall risk with  this approach, which pt refuses to acknowledge or accept.  Modified Rankin (Stroke Patients Only)       Balance Overall balance assessment: Needs assistance Sitting-balance support: No upper extremity supported;Feet supported Sitting balance-Leahy Scale: Fair     Standing balance support: Single extremity supported;During functional activity Standing balance-Leahy Scale:  Poor                              Cognition Arousal/Alertness: Awake/alert Behavior During Therapy: Flat affect Overall Cognitive Status: No family/caregiver present to determine baseline cognitive functioning Area of Impairment: Safety/judgement;Awareness;Problem solving                         Safety/Judgement: Decreased awareness of safety;Decreased awareness of deficits Awareness: Intellectual Problem Solving: Requires verbal cues;Requires tactile cues;Difficulty sequencing;Slow processing General Comments: increased difficulities noted with attempts at w/c mobility      Exercises      General Comments        Pertinent Vitals/Pain Pain Assessment: No/denies pain    Home Living                      Prior Function            PT Goals (current goals can now be found in the care plan section) Acute Rehab PT Goals Patient Stated Goal: to get out of here PT Goal Formulation: With patient Time For Goal Achievement: 06/10/20 Potential to Achieve Goals: Good Progress towards PT goals:  (Goals updated)    Frequency    Min 3X/week      PT Plan Discharge plan needs to be updated    Co-evaluation              AM-PAC PT "6 Clicks" Mobility   Outcome Measure  Help needed turning from your back to your side while in a flat bed without using bedrails?: None Help needed moving from lying on your back to sitting on the side of a flat bed without using bedrails?: A Little Help needed moving to and from a bed to a chair (including a wheelchair)?: A Little Help needed standing up from a chair using your arms (e.g., wheelchair or bedside chair)?: A Little Help needed to walk in hospital room?: A Lot Help needed climbing 3-5 steps with a railing? : Total 6 Click Score: 16    End of Session Equipment Utilized During Treatment: Gait belt Activity Tolerance: Treatment limited secondary to medical complications (Comment) (feeling weak from  not eating) Patient left: with call bell/phone within reach;in bed;with bed alarm set;with nursing/sitter in room Nurse Communication: Other (comment) (condom catheter off on arrival) PT Visit Diagnosis: Unsteadiness on feet (R26.81);Muscle weakness (generalized) (M62.81)     Time: 5621-3086 PT Time Calculation (min) (ACUTE ONLY): 36 min  Charges:  $Therapeutic Activity: 8-22 mins $Wheel Chair Management: 8-22 mins                      [name], PT Pager [phone]    [name] 05/27/2020, 10:44 AM

## 2020-05-27 NOTE — Plan of Care (Signed)

## 2020-05-27 NOTE — Progress Notes (Signed)
PROGRESS NOTE        PATIENT DETAILS Name: Brian [name] Age: 75 y.o. Sex: male Date of Birth: Jan 29, 1946 Admit Date: 05/09/2020 Admitting Physician Audria Nine, DO WER:[id number], [facility]  Brief Narrative: Patient is a 75 y.o. 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  Significant events: 3/19>> admit to ICU-PE-hypotension-AKI-sepsis-left groin/flank cellulitis. 3/21>> transfer to [facility] 4/5>> wanted to be discharged home-after refusing SNF-however changed his mind and wants to go to SNF  Significant studies: 3/19>> CTA chest: Bilateral PE 3/19>> bilateral lower extremity Doppler: Age indeterminate bilateral DVT 3/19>> CT head: No acute intracranial abnormality 3/19>> CT C-spine: No acute traumatic injury 3/19>> CT abdomen/pelvis: Stranding of fat in left flank, diverticulosis 3/19>> Echo: EF 65-70%, RVSP 45 mmHg 3/19>> EEG: Focal cerebral dysfunction in the right cerebral hemisphere-no seizures identified. 3/19>> UDS: Positive for cocaine  Antimicrobial therapy: Cefdinir: 3/21>> 3/25 Vancomycin: 3/19>> 3/20 Cefepime: 3/19 x 1  Microbiology data: 3/19>> blood culture: No growth  Procedures : None  Consults: PCCM  DVT Prophylaxis : apixaban (ELIQUIS) tablet 5 mg    Subjective: Lying comfortably in bed-no major issues overnight.   Assessment/Plan: Acute hypoxic respiratory failure due to lobar/segmental PE: Hypoxia has resolved-initially on IV heparin-has been transitioned to Eliquis.  Bilateral lower extremity age indeterminate DVT: On anticoagulation.  Sepsis due to left flank/groin  cellulitis: Sepsis physiology resolved-cultures negative-completed a course of antimicrobial therapy.  Acute metabolic encephalopathy: In the setting of sepsis/hypoxia-resolved.  Completely awake and alert.  History of CVA with chronic left-sided deficits, history of SAH/SDH  Seizure disorder: Continue Keppra.  Elevated troponins due to demand ischemia: No anginal symptoms-suspected demand ischemia due to hypoxia/PE-also with cocaine use.  Echo with preserved EF and no regional wall motion abnormality.  Doubt further work-up required.  AKI: Likely hemodynamically mediated-has resolved  HTN: BP stable-continue amlodipine  Chronic pain syndrome: Stable-on Neurontin/Percocet (home regimen)  Cocaine abuse: Has been counseled  Tobacco abuse: Continue transdermal nicotine  Deconditioning/debility: Due to acute illness-initially refused SNF-was subsequently discharged home on 4/5-however he change his mind and now wants to go to SNF.   Stage 1 sacrum pressure ulcer not present on admission   Diet: Diet Order            Diet - low sodium heart healthy           Diet heart healthy/carb modified Room service appropriate? Yes; Fluid consistency: Thin  Diet effective now                  Code Status: Full code   Family Communication: Spoke with daughter-[name]-over phone on 4/4  Disposition Plan: Status is: Inpatient  Remains inpatient appropriate because:Inpatient level of care appropriate due to severity of illness   Dispo:  Patient From: Home  Planned Disposition: SNF  Medically stable for discharge: Yes     Barriers to Discharge: Awaiting SNF bed/insurance authorization  Antimicrobial agents: Anti-infectives (From admission, onward)   Start     Dose/Rate Route Frequency Ordered Stop   05/11/20 1030  cefdinir (OMNICEF) capsule 300 mg  300 mg Oral Every 12 hours 05/11/20 0937 05/15/20 2100   05/10/20 1000  ceFAZolin (ANCEF) IVPB 2g/100 mL premix   Status:  Discontinued        2 g 200 mL/hr over 30 Minutes Intravenous Every 8 hours 05/10/20 0905 05/11/20 0937   05/10/20 0800  vancomycin (VANCOREADY) IVPB 750 mg/150 mL  Status:  Discontinued        750 mg 150 mL/hr over 60 Minutes Intravenous Every 24 hours 05/09/20 0833 05/10/20 0859   05/09/20 1000  ceFEPIme (MAXIPIME) 2 g in sodium chloride 0.9 % 100 mL IVPB  Status:  Discontinued        2 g 200 mL/hr over 30 Minutes Intravenous Every 12 hours 05/09/20 0833 05/10/20 0859   05/09/20 0845  vancomycin (VANCOREADY) IVPB 1500 mg/300 mL        1,500 mg 150 mL/hr over 120 Minutes Intravenous  Once 05/09/20 6967 05/09/20 1220       Time spent: 15- minutes-Greater than 50% of this time was spent in counseling, explanation of diagnosis, planning of further management, and coordination of care.  MEDICATIONS: Scheduled Meds: . amLODipine  5 mg Oral Daily  . apixaban  5 mg Oral BID  . Chlorhexidine Gluconate Cloth  6 each Topical Daily  . gabapentin  400 mg Oral QHS  . levETIRAcetam  500 mg Oral BID  . mouth rinse  15 mL Mouth Rinse BID  . nicotine  21 mg Transdermal Daily  . polyethylene glycol  17 g Oral Once  . senna-docusate  2 tablet Oral BID   Continuous Infusions: PRN Meds:.docusate sodium, oxyCODONE-acetaminophen **AND** [DISCONTINUED] oxyCODONE, polyethylene glycol   PHYSICAL EXAM: Vital signs: Vitals:   05/26/20 0938 05/26/20 2143 05/27/20 1018 05/27/20 1147  BP: 114/79 (!) 127/92 93/79 101/74  Pulse: 94 90 94 90  Resp: 17 16 16    Temp: 98.2 F (36.8 C) 97.9 F (36.6 C)    TempSrc: Oral Oral    SpO2: 97% 95% 96%   Weight:      Height:       Filed Weights   05/16/20 0500 05/19/20 0500 05/21/20 0500  Weight: 67.4 kg 63.9 kg 64.4 kg   Body mass index is 22.24 kg/m.   Gen Exam:Alert awake-not in any distress HEENT:atraumatic, normocephalic Chest: B/L clear to auscultation anteriorly CVS:S1S2 regular Abdomen:soft non tender, non distended Extremities:no  edema Skin: no rash  I have personally reviewed following labs and imaging studies  LABORATORY DATA: CBC: Recent Labs  Lab 05/25/20 0841  WBC 5.5  HGB 12.8*  HCT 41.1  MCV 88.6  PLT 893    Basic Metabolic Panel: Recent Labs  Lab 05/25/20 0841  NA 138  K 4.1  CL 104  CO2 25  GLUCOSE 79  BUN 24*  CREATININE 1.17  CALCIUM 9.5    GFR: Estimated Creatinine Clearance: 51.2 mL/min (by C-G formula based on SCr of 1.17 mg/dL).  Liver Function Tests: Recent Labs  Lab 05/25/20 0841  AST 16  ALT 14  ALKPHOS 64  BILITOT 0.7  PROT 9.1*  ALBUMIN 3.0*   No results for input(s): LIPASE, AMYLASE in the last 168 hours. No results for input(s): AMMONIA in the last 168 hours.  Coagulation Profile: No results for input(s): INR, PROTIME in the last 168 hours.  Cardiac Enzymes: No results for input(s): CKTOTAL, CKMB, CKMBINDEX, TROPONINI in the last 168 hours.  BNP (last 3 results) No results for input(s): PROBNP in the last 8760 hours.  Lipid Profile:  No results for input(s): CHOL, HDL, LDLCALC, TRIG, CHOLHDL, LDLDIRECT in the last 72 hours.  Thyroid Function Tests: No results for input(s): TSH, T4TOTAL, FREET4, T3FREE, THYROIDAB in the last 72 hours.  Anemia Panel: No results for input(s): VITAMINB12, FOLATE, FERRITIN, TIBC, IRON, RETICCTPCT in the last 72 hours.  Urine analysis:    Component Value Date/Time   COLORURINE YELLOW 05/09/2020 0833   APPEARANCEUR CLEAR 05/09/2020 0833   LABSPEC 1.019 05/09/2020 0833   PHURINE 5.0 05/09/2020 0833   GLUCOSEU NEGATIVE 05/09/2020 0833   HGBUR SMALL (A) 05/09/2020 0833   BILIRUBINUR NEGATIVE 05/09/2020 0833   KETONESUR NEGATIVE 05/09/2020 0833   PROTEINUR NEGATIVE 05/09/2020 0833   UROBILINOGEN 0.2 05/20/2011 1327   NITRITE NEGATIVE 05/09/2020 0833   LEUKOCYTESUR NEGATIVE 05/09/2020 0833    Sepsis Labs: Lactic Acid, Venous    Component Value Date/Time   LATICACIDVEN 2.7 (HH) 05/09/2020 0654     MICROBIOLOGY: Recent Results (from the past 240 hour(s))  SARS CORONAVIRUS 2 (TAT 6-24 HRS) Nasopharyngeal Nasopharyngeal Swab     Status: None   Collection Time: 05/20/20 10:48 AM   Specimen: Nasopharyngeal Swab  Result Value Ref Range Status   SARS Coronavirus 2 NEGATIVE NEGATIVE Final    Comment: (NOTE) SARS-CoV-2 target nucleic acids are NOT DETECTED.  The SARS-CoV-2 RNA is generally detectable in upper and lower respiratory specimens during the acute phase of infection. Negative results do not preclude SARS-CoV-2 infection, do not rule out co-infections with other pathogens, and should not be used as the sole basis for treatment or other patient management decisions. Negative results must be combined with clinical observations, patient history, and epidemiological information. The expected result is Negative.  Fact Sheet for Patients: SugarRoll.be  Fact Sheet for Healthcare Providers: https://www.woods-mathews.com/  This test is not yet approved or cleared by the [place] FDA and  has been authorized for detection and/or diagnosis of SARS-CoV-2 by FDA under an Emergency Use Authorization (EUA). This EUA will remain  in effect (meaning this test can be used) for the duration of the COVID-19 declaration under Se ction 564(b)(1) of the Act, 21 U.S.C. section 360bbb-3(b)(1), unless the authorization is terminated or revoked sooner.  Performed at [facility], [place] [address], [place], [place] [address]     RADIOLOGY STUDIES/RESULTS: No results found.   LOS: 18 days   Oren Binet, MD  [facility]    To contact the attending provider between 7A-7P or the covering provider during after hours 7P-7A, please log into the web site [email or web address] and access using universal [place] password for that web site. If you do not have the password, please call the hospital operator.  05/27/2020, 12:11 PM

## 2020-05-27 NOTE — Progress Notes (Signed)
Occupational Therapy Treatment Patient Details Name: Brian [name] MRN: [id number] DOB: Mar 06, 1945 Today's Date: 05/27/2020    History of present illness 75 year old gentleman admitted with multiple falls and somnolence.Pt  states he might have taken double dose of tramadol along with gabapentin. On arrival to ED, pt hypotensive with blood pressure of 71/48.  Resuscitated with fluid.  Found to have AKI, bilateral PE.   PMH:  active cocaine use, smoker, history of a stroke with left-sided hemiparesis, brain aneurysm on anticoagulation and bleeding on 1/21, spontaneous subdural hematoma on 2/21 status post burr hole and evacuation, seizure disorders   OT comments  Pt slowly progressing with OT. Pt limited by decreased strength, decreased ability to care for self and decreased endurance in order to properly care for self. Pt minA for sit to stands, but unable to safely take more than 2 steps due to weakness. Pt standing ~1 minute for pericare and able to perform with set-upA sidelying in bed. Pt hyper focused on having a bowel movement. Pt requiring quick action so asked for bed pan placed, RN aware of pt on bedpan. Needs in reach and bed alarm on.  Pt continues to have cognitive deficits regarding safety and poor awareness of deficits. Pt continues to require continued OT skilled services. OT following.    Follow Up Recommendations  SNF;Supervision/Assistance - 24 hour    Equipment Recommendations  3 in 1 bedside commode    Recommendations for Other Services      Precautions / Restrictions Precautions Precautions: Fall Restrictions Weight Bearing Restrictions: No       Mobility Bed Mobility Overal bed mobility: Needs Assistance Bed Mobility: Supine to Sit;Sit to Supine     Supine to sit: Supervision Sit to supine: Min assist   General bed mobility comments: using rails for mobility    Transfers Overall transfer level: Needs assistance Equipment used: None Transfers: Sit  to/from Stand Sit to Stand: Min guard;Min assist         General transfer comment: minguardA to minA for power up and stability once in standing    Balance Overall balance assessment: Needs assistance Sitting-balance support: No upper extremity supported;Feet supported Sitting balance-Leahy Scale: Fair     Standing balance support: Bilateral upper extremity supported Standing balance-Leahy Scale: Poor Standing balance comment: use of RW for stability; minA for power up and able to take 2 steps to [facility] and stand x1 minute.                           ADL either performed or assessed with clinical judgement   ADL Overall ADL's : Needs assistance/impaired             Lower Body Bathing: Maximal assistance;Sitting/lateral leans;Sit to/from stand Lower Body Bathing Details (indicate cue type and reason): washing bottom for "itchy" cheek and pt able to holdonto RW in standing for task and pt requires set-upA once sidelying to perform cream application to itchy area             Toileting- Clothing Manipulation and Hygiene: Maximal assistance;Sit to/from stand Toileting - Clothing Manipulation Details (indicate cue type and reason): assist for pericare     Functional mobility during ADLs: Minimal assistance;Cueing for safety General ADL Comments: Pt limited by decreased strength, decreased ability to care for self and decreased endurance in order to properly care for self. Pt reports "I can't go home because my son lives there and he is always getting into  fights. I can't be doing that anymore. He was just released from jail and I don't want to send him back."     Vision       Perception     Praxis      Cognition Arousal/Alertness: Awake/alert Behavior During Therapy: Flat affect Overall Cognitive Status: No family/caregiver present to determine baseline cognitive functioning Area of Impairment: Safety/judgement;Awareness;Problem solving                          Safety/Judgement: Decreased awareness of safety;Decreased awareness of deficits Awareness: Intellectual Problem Solving: Decreased initiation General Comments: Pt requiring assist for safe mobility and ADL.        Exercises Other Exercises Other Exercises: refused all HEP   Shoulder Instructions       General Comments VSS on RA    Pertinent Vitals/ Pain       Pain Assessment: No/denies pain  Home Living                                          Prior Functioning/Environment              Frequency  Min 2X/week        Progress Toward Goals  OT Goals(current goals can now be found in the care plan section)  Progress towards OT goals: Progressing toward goals  Acute Rehab OT Goals Patient Stated Goal: to go to a facility OT Goal Formulation: With patient Time For Goal Achievement: 06/10/20 Potential to Achieve Goals: Good ADL Goals Pt Will Perform Grooming: with modified independence;sitting Pt Will Transfer to Toilet: with min guard assist;stand pivot transfer;bedside commode Pt/caregiver will Perform Home Exercise Program: Right Upper extremity;With written HEP provided;Increased ROM;Increased strength Additional ADL Goal #1: Pt will increase to set-upA for OOB grooming/UB ADL to increase independence. Additional ADL Goal #2: Pt will tolerate sitting in recliner x3 hours in order to increase OOB tolerance.  Plan Discharge plan remains appropriate    Co-evaluation                 AM-PAC OT "6 Clicks" Daily Activity     Outcome Measure   Help from another person eating meals?: A Little Help from another person taking care of personal grooming?: A Little Help from another person toileting, which includes using toliet, bedpan, or urinal?: A Lot Help from another person bathing (including washing, rinsing, drying)?: A Little Help from another person to put on and taking off regular upper body clothing?: A Little Help from  another person to put on and taking off regular lower body clothing?: A Lot 6 Click Score: 16    End of Session Equipment Utilized During Treatment: Gait belt;Rolling walker  OT Visit Diagnosis: Unsteadiness on feet (R26.81);Muscle weakness (generalized) (M62.81);Other symptoms and signs involving cognitive function   Activity Tolerance Patient limited by pain   Patient Left in bed;with call bell/phone within reach;with bed alarm set   Nurse Communication Mobility status        Time: 1093-2355 OT Time Calculation (min): 21 min  Charges: OT General Charges $OT Visit: 1 Visit OT Treatments $Self Care/Home Management : 8-22 mins  Jefferey Pica, OTR/L Acute Rehabilitation Services Pager: [phone] Office: [phone]    Jerene Pitch 05/27/2020, 6:14 PM

## 2020-05-27 NOTE — Plan of Care (Signed)
  Problem: Education: Goal: Knowledge of General Education information will improve Description: Including pain rating scale, medication(s)/side effects and non-pharmacologic comfort measures Outcome: Progressing   Problem: Health Behavior/Discharge Planning: Goal: Ability to manage health-related needs will improve Outcome: Progressing   Problem: Clinical Measurements: Goal: Ability to maintain clinical measurements within normal limits will improve Outcome: Progressing Goal: Will remain free from infection Outcome: Progressing Goal: Diagnostic test results will improve Outcome: Progressing Goal: Respiratory complications will improve Outcome: Progressing Goal: Cardiovascular complication will be avoided Outcome: Progressing   Problem: Nutrition: Goal: Adequate nutrition will be maintained Outcome: Progressing   Problem: Coping: Goal: Level of anxiety will decrease Outcome: Progressing   Problem: Elimination: Goal: Will not experience complications related to bowel motility Outcome: Progressing Goal: Will not experience complications related to urinary retention Outcome: Progressing   Problem: Pain Managment: Goal: General experience of comfort will improve Outcome: Progressing   Problem: Safety: Goal: Ability to remain free from injury will improve Outcome: Progressing   Problem: Skin Integrity: Goal: Risk for impaired skin integrity will decrease Outcome: Progressing   Problem: Education: Goal: Expressions of having a comfortable level of knowledge regarding the disease process will increase Outcome: Progressing   Problem: Coping: Goal: Ability to adjust to condition or change in health will improve Outcome: Progressing Goal: Ability to identify appropriate support needs will improve Outcome: Progressing   Problem: Health Behavior/Discharge Planning: Goal: Compliance with prescribed medication regimen will improve Outcome: Progressing   Problem:  Medication: Goal: Risk for medication side effects will decrease Outcome: Progressing   Problem: Clinical Measurements: Goal: Complications related to the disease process, condition or treatment will be avoided or minimized Outcome: Progressing Goal: Diagnostic test results will improve Outcome: Progressing   Problem: Safety: Goal: Verbalization of understanding the information provided will improve Outcome: Progressing   Problem: Self-Concept: Goal: Level of anxiety will decrease Outcome: Progressing Goal: Ability to verbalize feelings about condition will improve Outcome: Progressing

## 2020-05-28 DIAGNOSIS — I2609 Other pulmonary embolism with acute cor pulmonale: Secondary | ICD-10-CM | POA: Diagnosis not present

## 2020-05-28 NOTE — Progress Notes (Signed)
PROGRESS NOTE        PATIENT DETAILS Name: Brian [name] Age: 75 y.o. Sex: male Date of Birth: [birth year] Admit Date: 05/09/2020 Admitting Physician Audria Nine, DO [id number], [facility]  Brief Narrative: Patient is a 75 y.o. 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  Significant events: 3/19>> admit to ICU-PE-hypotension-AKI-sepsis-left groin/flank cellulitis. 3/21>> transfer to [facility] 4/5>> wanted to be discharged home-after refusing SNF-however changed his mind and wants to go to SNF  Significant studies: 3/19>> CTA chest: Bilateral PE 3/19>> bilateral lower extremity Doppler: Age indeterminate bilateral DVT 3/19>> CT head: No acute intracranial abnormality 3/19>> CT C-spine: No acute traumatic injury 3/19>> CT abdomen/pelvis: Stranding of fat in left flank, diverticulosis 3/19>> Echo: EF 65-70%, RVSP 45 mmHg 3/19>> EEG: Focal cerebral dysfunction in the right cerebral hemisphere-no seizures identified. 3/19>> UDS: Positive for cocaine  Antimicrobial therapy: Cefdinir: 3/21>> 3/25 Vancomycin: 3/19>> 3/20 Cefepime: 3/19 x 1  Microbiology data: 3/19>> blood culture: No growth  Procedures : None  Consults: PCCM  DVT Prophylaxis : apixaban (ELIQUIS) tablet 5 mg    Subjective: Getting frustrated that he is still in the hospital.  Inquiring about going home again-but acknowledges that he will wait for another 1-2 days for SNF authorization to come by.   Assessment/Plan: Acute hypoxic respiratory failure due to lobar/segmental PE: Hypoxia has resolved-initially on IV heparin-has been  transitioned to Eliquis.  Bilateral lower extremity age indeterminate DVT: On anticoagulation.  Sepsis due to left flank/groin cellulitis: Sepsis physiology resolved-cultures negative-completed a course of antimicrobial therapy.  Acute metabolic encephalopathy: In the setting of sepsis/hypoxia-resolved.  Completely awake and alert.  History of CVA with chronic left-sided deficits, history of SAH/SDH  Seizure disorder: Continue Keppra.  Elevated troponins due to demand ischemia: No anginal symptoms-suspected demand ischemia due to hypoxia/PE-also with cocaine use.  Echo with preserved EF and no regional wall motion abnormality.  Doubt further work-up required.  AKI: Likely hemodynamically mediated-has resolved  HTN: BP stable-continue amlodipine  Chronic pain syndrome: Stable-on Neurontin/Percocet (home regimen)  Cocaine abuse: Has been counseled  Tobacco abuse: Continue transdermal nicotine  Deconditioning/debility: Due to acute illness-initially refused SNF-was subsequently discharged home on 4/5-however he change his mind and now wants to go to SNF.   Stage 1 sacrum pressure ulcer not present on admission   Diet: Diet Order            Diet - low sodium heart healthy           Diet heart healthy/carb modified Room service appropriate? Yes; Fluid consistency: Thin  Diet effective now                  Code Status: Full code   Family Communication: Spoke with daughter-Brian [name]-[phone]-over phone on 4/4  Disposition Plan: Status is: Inpatient  Remains inpatient appropriate because:Inpatient level of care appropriate due to severity of illness   Dispo:  Patient From: Home  Planned Disposition: SNF  Medically stable for discharge: Yes     Barriers to Discharge: Awaiting SNF bed/insurance authorization  Antimicrobial agents: Anti-infectives (From admission,  onward)   Start     Dose/Rate Route Frequency Ordered Stop   05/11/20 1030  cefdinir (OMNICEF)  capsule 300 mg        300 mg Oral Every 12 hours 05/11/20 0937 05/15/20 2100   05/10/20 1000  ceFAZolin (ANCEF) IVPB 2g/100 mL premix  Status:  Discontinued        2 g 200 mL/hr over 30 Minutes Intravenous Every 8 hours 05/10/20 0905 05/11/20 0937   05/10/20 0800  vancomycin (VANCOREADY) IVPB 750 mg/150 mL  Status:  Discontinued        750 mg 150 mL/hr over 60 Minutes Intravenous Every 24 hours 05/09/20 0833 05/10/20 0859   05/09/20 1000  ceFEPIme (MAXIPIME) 2 g in sodium chloride 0.9 % 100 mL IVPB  Status:  Discontinued        2 g 200 mL/hr over 30 Minutes Intravenous Every 12 hours 05/09/20 0833 05/10/20 0859   05/09/20 0845  vancomycin (VANCOREADY) IVPB 1500 mg/300 mL        1,500 mg 150 mL/hr over 120 Minutes Intravenous  Once 05/09/20 3419 05/09/20 1220       Time spent: 15- minutes-Greater than 50% of this time was spent in counseling, explanation of diagnosis, planning of further management, and coordination of care.  MEDICATIONS: Scheduled Meds: . amLODipine  5 mg Oral Daily  . apixaban  5 mg Oral BID  . Chlorhexidine Gluconate Cloth  6 each Topical Daily  . gabapentin  400 mg Oral QHS  . levETIRAcetam  500 mg Oral BID  . mouth rinse  15 mL Mouth Rinse BID  . nicotine  21 mg Transdermal Daily  . polyethylene glycol  17 g Oral Once  . senna-docusate  2 tablet Oral BID   Continuous Infusions: PRN Meds:.docusate sodium, oxyCODONE-acetaminophen **AND** [DISCONTINUED] oxyCODONE, polyethylene glycol   PHYSICAL EXAM: Vital signs: Vitals:   05/27/20 1018 05/27/20 1147 05/27/20 1418 05/27/20 2117  BP: 93/79 101/74 100/70 110/82  Pulse: 94 90 87 82  Resp: 16  16 16   Temp:    98 F (36.7 C)  TempSrc:    Oral  SpO2: 96%  97% 97%  Weight:      Height:       Filed Weights   05/16/20 0500 05/19/20 0500 05/21/20 0500  Weight: 67.4 kg 63.9 kg 64.4 kg   Body mass index is 22.24 kg/m.   Gen Exam:Alert awake-not in any distress HEENT:atraumatic, normocephalic Chest:  B/L clear to auscultation anteriorly CVS:S1S2 regular Abdomen:soft non tender, non distended Extremities:no edema Skin: no rash  I have personally reviewed following labs and imaging studies  LABORATORY DATA: CBC: Recent Labs  Lab 05/25/20 0841  WBC 5.5  HGB 12.8*  HCT 41.1  MCV 88.6  PLT 622    Basic Metabolic Panel: Recent Labs  Lab 05/25/20 0841  NA 138  K 4.1  CL 104  CO2 25  GLUCOSE 79  BUN 24*  CREATININE 1.17  CALCIUM 9.5    GFR: Estimated Creatinine Clearance: 51.2 mL/min (by C-G formula based on SCr of 1.17 mg/dL).  Liver Function Tests: Recent Labs  Lab 05/25/20 0841  AST 16  ALT 14  ALKPHOS 64  BILITOT 0.7  PROT 9.1*  ALBUMIN 3.0*   No results for input(s): LIPASE, AMYLASE in the last 168 hours. No results for input(s): AMMONIA in the last 168 hours.  Coagulation Profile: No results for input(s): INR, PROTIME in the last 168 hours.  Cardiac Enzymes: No results for input(s): CKTOTAL,  CKMB, CKMBINDEX, TROPONINI in the last 168 hours.  BNP (last 3 results) No results for input(s): PROBNP in the last 8760 hours.  Lipid Profile: No results for input(s): CHOL, HDL, LDLCALC, TRIG, CHOLHDL, LDLDIRECT in the last 72 hours.  Thyroid Function Tests: No results for input(s): TSH, T4TOTAL, FREET4, T3FREE, THYROIDAB in the last 72 hours.  Anemia Panel: No results for input(s): VITAMINB12, FOLATE, FERRITIN, TIBC, IRON, RETICCTPCT in the last 72 hours.  Urine analysis:    Component Value Date/Time   COLORURINE YELLOW 05/09/2020 0833   APPEARANCEUR CLEAR 05/09/2020 0833   LABSPEC 1.019 05/09/2020 0833   PHURINE 5.0 05/09/2020 0833   GLUCOSEU NEGATIVE 05/09/2020 0833   HGBUR SMALL (A) 05/09/2020 0833   BILIRUBINUR NEGATIVE 05/09/2020 0833   KETONESUR NEGATIVE 05/09/2020 0833   PROTEINUR NEGATIVE 05/09/2020 0833   UROBILINOGEN 0.2 05/20/2011 1327   NITRITE NEGATIVE 05/09/2020 0833   LEUKOCYTESUR NEGATIVE 05/09/2020 0833    Sepsis  Labs: Lactic Acid, Venous    Component Value Date/Time   LATICACIDVEN 2.7 (HH) 05/09/2020 0654    MICROBIOLOGY: Recent Results (from the past 240 hour(s))  SARS CORONAVIRUS 2 (TAT 6-24 HRS) Nasopharyngeal Nasopharyngeal Swab     Status: None   Collection Time: 05/20/20 10:48 AM   Specimen: Nasopharyngeal Swab  Result Value Ref Range Status   SARS Coronavirus 2 NEGATIVE NEGATIVE Final    Comment: (NOTE) SARS-CoV-2 target nucleic acids are NOT DETECTED.  The SARS-CoV-2 RNA is generally detectable in upper and lower respiratory specimens during the acute phase of infection. Negative results do not preclude SARS-CoV-2 infection, do not rule out co-infections with other pathogens, and should not be used as the sole basis for treatment or other patient management decisions. Negative results must be combined with clinical observations, patient history, and epidemiological information. The expected result is Negative.  Fact Sheet for Patients: SugarRoll.be  Fact Sheet for Healthcare Providers: https://www.woods-mathews.com/  This test is not yet approved or cleared by the [place] FDA and  has been authorized for detection and/or diagnosis of SARS-CoV-2 by FDA under an Emergency Use Authorization (EUA). This EUA will remain  in effect (meaning this test can be used) for the duration of the COVID-19 declaration under Se ction 564(b)(1) of the Act, 21 U.S.C. section 360bbb-3(b)(1), unless the authorization is terminated or revoked sooner.  Performed at [facility], [place] [address] [place], [place] [address]     RADIOLOGY STUDIES/RESULTS: No results found.   LOS: 19 days   Oren Binet, MD  [facility]    To contact the attending provider between 7A-7P or the covering provider during after hours 7P-7A, please log into the web site [email or web address] and access using universal [place] password for that web site.  If you do not have the password, please call the hospital operator.  05/28/2020, 10:53 AM

## 2020-05-28 NOTE — Plan of Care (Signed)

## 2020-05-29 DIAGNOSIS — I2609 Other pulmonary embolism with acute cor pulmonale: Secondary | ICD-10-CM | POA: Diagnosis not present

## 2020-05-29 NOTE — Progress Notes (Signed)
Physical Therapy Treatment Patient Details Name: Brian [name] MRN: [id number] DOB: Dec 03, 1945 Today's Date: 05/29/2020    History of Present Illness 75 year old gentleman admitted with multiple falls and somnolence.Pt  states he might have taken double dose of tramadol along with gabapentin. On arrival to ED, pt hypotensive with blood pressure of 71/48.  Resuscitated with fluid.  Found to have AKI, bilateral PE.   PMH:  active cocaine use, smoker, history of a stroke with left-sided hemiparesis, brain aneurysm on anticoagulation and bleeding on 1/21, spontaneous subdural hematoma on 2/21 status post burr hole and evacuation, seizure disorders    PT Comments    Pt remains at a very high falls risk. During session pt reports he is an expert at falling and he does not care if he falls as long as he does not get hurt. Pt remains unsteady with all out of bed mobility due to LE weakness and impaired balance. Pt continues to scissor with ambulation and demonstrates impaired management of DME, needing physical assistance from PT to prevent falls. Pt has limited awareness of deficits and safety, and continues to demonstrate poor ability to manage DME. Pt is highly likely to fall at home if not provided with physical assistance for all out of bed mobility. Pt has been denied SNF placement and is likely to discharge home. PT recommends that the patient only ambulate with [facility] PT services. PT recommends use of wheelchair and performance of stand pivot transfers with walker upon return home. Acute PT services will continue to follow in an effort to improve balance and reduce falls risk.  Follow Up Recommendations  SNF;Home health PT (HHPT as pt denied SNF. Pt will need assistance for all out of bed mobility, physical assistance for any OOB mobility.)     Equipment Recommendations  Wheelchair (measurements PT);Wheelchair cushion (measurements PT)    Recommendations for Other Services       Precautions /  Restrictions Precautions Precautions: Fall Restrictions Weight Bearing Restrictions: No    Mobility  Bed Mobility Overal bed mobility: Needs Assistance Bed Mobility: Supine to Sit     Supine to sit: Supervision          Transfers Overall transfer level: Needs assistance Equipment used: None;Rolling walker (2 wheeled) Transfers: Sit to/from Stand Sit to Stand: Min assist;Min guard Stand pivot transfers: Min guard       General transfer comment: pt requires assistance for safety, physical assistance required to elevate from lower surfaces. Pt with prolonged elevation period into standing due to LE weakness  Ambulation/Gait Ambulation/Gait assistance: Mod assist Gait Distance (Feet): 12 Feet (additional trial of 10') Assistive device: Rolling walker (2 wheeled) Gait Pattern/deviations: Step-to pattern;Scissoring;Decreased weight shift to left Gait velocity: reduced Gait velocity interpretation: <1.31 ft/sec, indicative of household ambulator General Gait Details: pt with short step-through gait, scissoring with LLE crossing midline, pt with strong R lateral lean and pushing walker to left requiring PT assist to prevent drift and fall   Stairs             Wheelchair Mobility    Modified Rankin (Stroke Patients Only)       Balance Overall balance assessment: Needs assistance Sitting-balance support: No upper extremity supported;Feet supported Sitting balance-Leahy Scale: Fair     Standing balance support: Bilateral upper extremity supported Standing balance-Leahy Scale: Poor Standing balance comment: minA with RW  Cognition Arousal/Alertness: Awake/alert Behavior During Therapy: Impulsive Overall Cognitive Status: No family/caregiver present to determine baseline cognitive functioning Area of Impairment: Safety/judgement;Problem solving;Awareness                     Memory: Decreased recall of  precautions;Decreased short-term memory Following Commands: Follows one step commands consistently Safety/Judgement: Decreased awareness of safety;Decreased awareness of deficits Awareness: Intellectual Problem Solving: Slow processing        Exercises      General Comments General comments (skin integrity, edema, etc.): VSS on RA      Pertinent Vitals/Pain Pain Assessment: Faces Faces Pain Scale: Hurts little more Pain Location: back Pain Descriptors / Indicators: Aching Pain Intervention(s): Monitored during session    Home Living                      Prior Function            PT Goals (current goals can now be found in the care plan section) Acute Rehab PT Goals Patient Stated Goal: to go home Progress towards PT goals: Not progressing toward goals - comment (continues to demonstrate significant imbalance, high falls risk)    Frequency    Min 3X/week      PT Plan Discharge plan needs to be updated    Co-evaluation              AM-PAC PT "6 Clicks" Mobility   Outcome Measure  Help needed turning from your back to your side while in a flat bed without using bedrails?: None Help needed moving from lying on your back to sitting on the side of a flat bed without using bedrails?: A Little Help needed moving to and from a bed to a chair (including a wheelchair)?: A Little Help needed standing up from a chair using your arms (e.g., wheelchair or bedside chair)?: A Little Help needed to walk in hospital room?: A Lot Help needed climbing 3-5 steps with a railing? : Total 6 Click Score: 16    End of Session Equipment Utilized During Treatment: Gait belt Activity Tolerance: Patient limited by pain (back pain) Patient left: in chair;with call bell/phone within reach;with chair alarm set Nurse Communication: Mobility status PT Visit Diagnosis: Unsteadiness on feet (R26.81);Muscle weakness (generalized) (M62.81)     Time: 9030-0923 PT Time  Calculation (min) (ACUTE ONLY): 20 min  Charges:  $Gait Training: 8-22 mins                     Zenaida Niece, PT, DPT Acute Rehabilitation Pager: [phone]    Zenaida Niece 05/29/2020, 4:26 PM

## 2020-05-29 NOTE — Progress Notes (Addendum)
Informed by social work that insurance has denied patient's SNF stay-patient is also very keen on going home-he did not want to go to SNF-was convinced by family to consider SNF a few days back.Patient has been medically stable for discharge-maximal home health services has been ordered-wheelchair has been ordered.  Social worker talking with family/patient-will be discharged when family ready to take patient.

## 2020-05-29 NOTE — Progress Notes (Signed)
PROGRESS NOTE        PATIENT DETAILS Name: Brian [name] Age: 75 y.o. Sex: male Date of Birth: Nov 14, 1945 Admit Date: 05/09/2020 Admitting Physician Audria Nine, DO OIZ:[id number], [facility]  Brief Narrative: Patient is a 75 y.o. 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  Significant events: 3/19>> admit to ICU-PE-hypotension-AKI-sepsis-left groin/flank cellulitis. 3/21>> transfer to [facility] 4/5>> wanted to be discharged home-after refusing SNF-however changed his mind and wants to go to SNF  Significant studies: 3/19>> CTA chest: Bilateral PE 3/19>> bilateral lower extremity Doppler: Age indeterminate bilateral DVT 3/19>> CT head: No acute intracranial abnormality 3/19>> CT C-spine: No acute traumatic injury 3/19>> CT abdomen/pelvis: Stranding of fat in left flank, diverticulosis 3/19>> Echo: EF 65-70%, RVSP 45 mmHg 3/19>> EEG: Focal cerebral dysfunction in the right cerebral hemisphere-no seizures identified. 3/19>> UDS: Positive for cocaine  Antimicrobial therapy: Cefdinir: 3/21>> 3/25 Vancomycin: 3/19>> 3/20 Cefepime: 3/19 x 1  Microbiology data: 3/19>> blood culture: No growth  Procedures : None  Consults: PCCM  DVT Prophylaxis : apixaban (ELIQUIS) tablet 5 mg    Subjective: Very frustrated-but insurance is taking this long for SNF authorization-is contemplating leaving the hospital-and going home with home health.   Assessment/Plan: Acute hypoxic respiratory failure due to lobar/segmental PE: Hypoxia has resolved-initially on IV heparin-has been transitioned to Eliquis.  Bilateral  lower extremity age indeterminate DVT: On anticoagulation.  Sepsis due to left flank/groin cellulitis: Sepsis physiology resolved-cultures negative-completed a course of antimicrobial therapy.  Acute metabolic encephalopathy: In the setting of sepsis/hypoxia-resolved.  Completely awake and alert.  History of CVA with chronic left-sided deficits, history of SAH/SDH  Seizure disorder: Continue Keppra.  Elevated troponins due to demand ischemia: No anginal symptoms-suspected demand ischemia due to hypoxia/PE-also with cocaine use.  Echo with preserved EF and no regional wall motion abnormality.  Doubt further work-up required.  AKI: Likely hemodynamically mediated-has resolved  HTN: BP stable-continue amlodipine  Chronic pain syndrome: Stable-on Neurontin/Percocet (home regimen)  Cocaine abuse: Has been counseled  Tobacco abuse: Continue transdermal nicotine  Deconditioning/debility: Due to acute illness-initially refused SNF-was subsequently discharged home on 4/5-however he change his mind and now wants to go to SNF.   Stage 1 sacrum pressure ulcer not present on admission   Diet: Diet Order            Diet - low sodium heart healthy           Diet heart healthy/carb modified Room service appropriate? Yes; Fluid consistency: Thin  Diet effective now                  Code Status: Full code   Family Communication: Spoke with daughter-[name]-[phone]-over phone on 4/4  Disposition Plan: Status is: Inpatient  Remains inpatient appropriate because:Inpatient level of care appropriate due to severity of illness   Dispo:  Patient From:    Planned Disposition: SNF  Medically stable for discharge:       Barriers to Discharge: Awaiting SNF bed/insurance authorization  Antimicrobial agents: Anti-infectives (From admission, onward)   Start     Dose/Rate  Route Frequency Ordered Stop   05/11/20 1030  cefdinir (OMNICEF) capsule 300 mg        300 mg Oral Every 12  hours 05/11/20 0937 05/15/20 2100   05/10/20 1000  ceFAZolin (ANCEF) IVPB 2g/100 mL premix  Status:  Discontinued        2 g 200 mL/hr over 30 Minutes Intravenous Every 8 hours 05/10/20 0905 05/11/20 0937   05/10/20 0800  vancomycin (VANCOREADY) IVPB 750 mg/150 mL  Status:  Discontinued        750 mg 150 mL/hr over 60 Minutes Intravenous Every 24 hours 05/09/20 0833 05/10/20 0859   05/09/20 1000  ceFEPIme (MAXIPIME) 2 g in sodium chloride 0.9 % 100 mL IVPB  Status:  Discontinued        2 g 200 mL/hr over 30 Minutes Intravenous Every 12 hours 05/09/20 0833 05/10/20 0859   05/09/20 0845  vancomycin (VANCOREADY) IVPB 1500 mg/300 mL        1,500 mg 150 mL/hr over 120 Minutes Intravenous  Once 05/09/20 9767 05/09/20 1220       Time spent: 15- minutes-Greater than 50% of this time was spent in counseling, explanation of diagnosis, planning of further management, and coordination of care.  MEDICATIONS: Scheduled Meds: . amLODipine  5 mg Oral Daily  . apixaban  5 mg Oral BID  . Chlorhexidine Gluconate Cloth  6 each Topical Daily  . gabapentin  400 mg Oral QHS  . levETIRAcetam  500 mg Oral BID  . mouth rinse  15 mL Mouth Rinse BID  . nicotine  21 mg Transdermal Daily  . polyethylene glycol  17 g Oral Once  . senna-docusate  2 tablet Oral BID   Continuous Infusions: PRN Meds:.docusate sodium, oxyCODONE-acetaminophen **AND** [DISCONTINUED] oxyCODONE, polyethylene glycol   PHYSICAL EXAM: Vital signs: Vitals:   05/28/20 1620 05/28/20 2116 05/29/20 0602 05/29/20 0814  BP: 108/80 109/78 120/70 117/87  Pulse: 86 87 85 80  Resp: 20 19 19 18   Temp: 98 F (36.7 C) 98 F (36.7 C) 98.7 F (37.1 C) 97.9 F (36.6 C)  TempSrc:  Oral Oral Oral  SpO2: 100% 94% 95% 93%  Weight:      Height:       Filed Weights   05/16/20 0500 05/19/20 0500 05/21/20 0500  Weight: 67.4 kg 63.9 kg 64.4 kg   Body mass index is 22.24 kg/m.   Gen Exam:Alert awake-not in any distress HEENT:atraumatic,  normocephalic Chest: B/L clear to auscultation anteriorly CVS:S1S2 regular Abdomen:soft non tender, non distended Extremities:no edema Skin: no rash  I have personally reviewed following labs and imaging studies  LABORATORY DATA: CBC: Recent Labs  Lab 05/25/20 0841  WBC 5.5  HGB 12.8*  HCT 41.1  MCV 88.6  PLT 341    Basic Metabolic Panel: Recent Labs  Lab 05/25/20 0841  NA 138  K 4.1  CL 104  CO2 25  GLUCOSE 79  BUN 24*  CREATININE 1.17  CALCIUM 9.5    GFR: Estimated Creatinine Clearance: 51.2 mL/min (by C-G formula based on SCr of 1.17 mg/dL).  Liver Function Tests: Recent Labs  Lab 05/25/20 0841  AST 16  ALT 14  ALKPHOS 64  BILITOT 0.7  PROT 9.1*  ALBUMIN 3.0*   No results for input(s): LIPASE, AMYLASE in the last 168 hours. No results for input(s): AMMONIA in the last 168 hours.  Coagulation Profile: No results for input(s): INR, PROTIME in the last 168 hours.  Cardiac Enzymes: No results for input(s): CKTOTAL,  CKMB, CKMBINDEX, TROPONINI in the last 168 hours.  BNP (last 3 results) No results for input(s): PROBNP in the last 8760 hours.  Lipid Profile: No results for input(s): CHOL, HDL, LDLCALC, TRIG, CHOLHDL, LDLDIRECT in the last 72 hours.  Thyroid Function Tests: No results for input(s): TSH, T4TOTAL, FREET4, T3FREE, THYROIDAB in the last 72 hours.  Anemia Panel: No results for input(s): VITAMINB12, FOLATE, FERRITIN, TIBC, IRON, RETICCTPCT in the last 72 hours.  Urine analysis:    Component Value Date/Time   COLORURINE YELLOW 05/09/2020 0833   APPEARANCEUR CLEAR 05/09/2020 0833   LABSPEC 1.019 05/09/2020 0833   PHURINE 5.0 05/09/2020 0833   GLUCOSEU NEGATIVE 05/09/2020 0833   HGBUR SMALL (A) 05/09/2020 0833   BILIRUBINUR NEGATIVE 05/09/2020 0833   KETONESUR NEGATIVE 05/09/2020 0833   PROTEINUR NEGATIVE 05/09/2020 0833   UROBILINOGEN 0.2 05/20/2011 1327   NITRITE NEGATIVE 05/09/2020 0833   LEUKOCYTESUR NEGATIVE 05/09/2020 0833     Sepsis Labs: Lactic Acid, Venous    Component Value Date/Time   LATICACIDVEN 2.7 (HH) 05/09/2020 0654    MICROBIOLOGY: Recent Results (from the past 240 hour(s))  SARS CORONAVIRUS 2 (TAT 6-24 HRS) Nasopharyngeal Nasopharyngeal Swab     Status: None   Collection Time: 05/20/20 10:48 AM   Specimen: Nasopharyngeal Swab  Result Value Ref Range Status   SARS Coronavirus 2 NEGATIVE NEGATIVE Final    Comment: (NOTE) SARS-CoV-2 target nucleic acids are NOT DETECTED.  The SARS-CoV-2 RNA is generally detectable in upper and lower respiratory specimens during the acute phase of infection. Negative results do not preclude SARS-CoV-2 infection, do not rule out co-infections with other pathogens, and should not be used as the sole basis for treatment or other patient management decisions. Negative results must be combined with clinical observations, patient history, and epidemiological information. The expected result is Negative.  Fact Sheet for Patients: SugarRoll.be  Fact Sheet for Healthcare Providers: https://www.woods-mathews.com/  This test is not yet approved or cleared by the [place] FDA and  has been authorized for detection and/or diagnosis of SARS-CoV-2 by FDA under an Emergency Use Authorization (EUA). This EUA will remain  in effect (meaning this test can be used) for the duration of the COVID-19 declaration under Se ction 564(b)(1) of the Act, 21 U.S.C. section 360bbb-3(b)(1), unless the authorization is terminated or revoked sooner.  Performed at [facility], [place] [address], [place], [place] [address]     RADIOLOGY STUDIES/RESULTS: No results found.   LOS: 20 days   Oren Binet, MD  [facility]    To contact the attending provider between 7A-7P or the covering provider during after hours 7P-7A, please log into the web site [email or web address] and access using universal [place] password for  that web site. If you do not have the password, please call the hospital operator.  05/29/2020, 9:47 AM

## 2020-05-29 NOTE — Plan of Care (Signed)

## 2020-05-29 NOTE — Progress Notes (Signed)
Occupational Therapy Treatment Patient Details Name: Brian [name] MRN: [id number] DOB: 1946-02-20 Today's Date: 05/29/2020    History of present illness 74 year old gentleman admitted with multiple falls and somnolence.Pt  states he might have taken double dose of tramadol along with gabapentin. On arrival to ED, pt hypotensive with blood pressure of 71/48.  Resuscitated with fluid.  Found to have AKI, bilateral PE.   PMH:  active cocaine use, smoker, history of a stroke with left-sided hemiparesis, brain aneurysm on anticoagulation and bleeding on 1/21, spontaneous subdural hematoma on 2/21 status post burr hole and evacuation, seizure disorders   OT comments  Pt. Seen for skilled OT treatment session. Pt. Reports he completed bathing and sitting in recliner prior to my arrival.  Reports feeling tired.  Declines oob but agreeable to HEP for BUE strengthening and endurance to aide in safety for adls and mobility.  Pt. Completed bed mobility mod a for improved positioning prior to exercises.  Good return demo of b ue exercises.  Cues for rest breaks but tolerated well.  Reviewed recommendation to complete throughout the day.    Follow Up Recommendations  SNF;Supervision/Assistance - 24 hour    Equipment Recommendations  3 in 1 bedside commode    Recommendations for Other Services      Precautions / Restrictions Precautions Precautions: Fall       Mobility Bed Mobility Overal bed mobility: Needs Assistance             General bed mobility comments: utilized headboard to pull up in bed and push with LLE. mod a with therapist asst. also pulling on pad to assist pulling up in bed    Transfers                      Balance                                           ADL either performed or assessed with clinical judgement   ADL Overall ADL's : Needs assistance/impaired                                       General ADL Comments: pt.  declined oob stating he had completed bathing and sitting up in recliner prior to my arrival.  agreeable to bed mobility and B UE ther ex.     Vision       Perception     Praxis      Cognition Arousal/Alertness: Awake/alert Behavior During Therapy: WFL for tasks assessed/performed Overall Cognitive Status: No family/caregiver present to determine baseline cognitive functioning                                          Exercises General Exercises - Upper Extremity Shoulder Flexion: AROM;Both;Supine;10 reps Shoulder Extension: AROM;Supine;Both;10 reps Elbow Flexion: AROM;Both;Supine;10 reps Elbow Extension: AROM;Both;Supine   Shoulder Instructions       General Comments      Pertinent Vitals/ Pain       Pain Assessment: No/denies pain  Home Living  Prior Functioning/Environment              Frequency  Min 2X/week        Progress Toward Goals  OT Goals(current goals can now be found in the care plan section)  Progress towards OT goals: Progressing toward goals     Plan Discharge plan remains appropriate    Co-evaluation                 AM-PAC OT "6 Clicks" Daily Activity     Outcome Measure   Help from another person eating meals?: A Little Help from another person taking care of personal grooming?: A Little Help from another person toileting, which includes using toliet, bedpan, or urinal?: A Lot Help from another person bathing (including washing, rinsing, drying)?: A Little Help from another person to put on and taking off regular upper body clothing?: A Little Help from another person to put on and taking off regular lower body clothing?: A Lot 6 Click Score: 16    End of Session    OT Visit Diagnosis: Unsteadiness on feet (R26.81);Muscle weakness (generalized) (M62.81);Other symptoms and signs involving cognitive function   Activity Tolerance Patient tolerated  treatment well   Patient Left in bed;with call bell/phone within reach;with bed alarm set   Nurse Communication          Time: 9150-5697 OT Time Calculation (min): 8 min  Charges: OT General Charges $OT Visit: 1 Visit OT Treatments $Therapeutic Exercise: 8-22 mins  Sonia Baller, COTA/L Acute Rehabilitation [phone]    05/29/2020, 10:40 AM

## 2020-05-29 NOTE — TOC Progression Note (Addendum)
Transition of Care [facility]) - Progression Note    Patient Details  Name: Brian [name] MRN: [id number] Date of Birth: November 01, 1945  Transition of Care [facility] CM/SW Contact  Joanne Chars, LCSW Phone Number: 05/29/2020, 2:57 PM  Clinical Narrative:   CSW received call from [facility] that pt SNF auth request was denied.  MD informed.  CSW spoke with pt and daughter Brian [name] and informed them.  Daughter will contact brother regarding pt return.  Daughter states she is unable to pick pt up today due to short notice.  CSW LM with  [place] [place] services.  CSW spoke with Levada Dy at [facility] and they are still able to provide [facility].           Expected Discharge Plan and Services           Expected Discharge Date: 05/26/20               DME Arranged: Wheelchair manual DME Agency: [employer] Date DME Agency Contacted: 05/29/20 Time DME Agency Contacted: [phone] Representative spoke with at DME Agency: [place] Arranged: PT,OT,Social Work [place]: [place] Date [place]: 05/25/20 Time [place]: 1600 Representative spoke with at [place]: Levada Dy   Social Determinants of Health ([place]) Interventions    Readmission Risk Interventions Readmission Risk Prevention Plan 04/03/2019 06/29/2018  Post Dischage Appt - Complete  Medication Screening - Complete  Transportation Screening Complete Complete  PCP or Specialist Appt within 5-7 Days Complete -  Home Care Screening Complete -  Medication Review (RN CM) Complete -  Some recent data might be hidden

## 2020-05-30 DIAGNOSIS — I2609 Other pulmonary embolism with acute cor pulmonale: Secondary | ICD-10-CM | POA: Diagnosis not present

## 2020-05-30 NOTE — Plan of Care (Signed)

## 2020-05-30 NOTE — Progress Notes (Signed)
PROGRESS NOTE        PATIENT DETAILS Name: Brian [name] Age: 75 y.o. Sex: male Date of Birth: 05-03-1945 Admit Date: 05/09/2020 Admitting Physician Audria Nine, DO HER:[id number], [facility]  Brief Narrative: Patient is a 75 y.o. 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  Significant events: 3/19>> admit to ICU-PE-hypotension-AKI-sepsis-left groin/flank cellulitis. 3/21>> transfer to [facility] 4/5>> wanted to be discharged home-after refusing SNF-however changed his mind and wants to go to SNF  Significant studies: 3/19>> CTA chest: Bilateral PE 3/19>> bilateral lower extremity Doppler: Age indeterminate bilateral DVT 3/19>> CT head: No acute intracranial abnormality 3/19>> CT C-spine: No acute traumatic injury 3/19>> CT abdomen/pelvis: Stranding of fat in left flank, diverticulosis 3/19>> Echo: EF 65-70%, RVSP 45 mmHg 3/19>> EEG: Focal cerebral dysfunction in the right cerebral hemisphere-no seizures identified. 3/19>> UDS: Positive for cocaine  Antimicrobial therapy: Cefdinir: 3/21>> 3/25 Vancomycin: 3/19>> 3/20 Cefepime: 3/19 x 1  Microbiology data: 3/19>> blood culture: No growth  Procedures : None  Consults: PCCM  DVT Prophylaxis : apixaban (ELIQUIS) tablet 5 mg    Subjective: Wants to go home-claims his daughter is going to pick him up today.   Assessment/Plan: Acute hypoxic respiratory failure due to lobar/segmental PE: Hypoxia has resolved-initially on IV heparin-has been transitioned to Eliquis.  Bilateral lower extremity age indeterminate DVT: On anticoagulation.  Sepsis due to  left flank/groin cellulitis: Sepsis physiology resolved-cultures negative-completed a course of antimicrobial therapy.  Acute metabolic encephalopathy: In the setting of sepsis/hypoxia-resolved.  Completely awake and alert.  History of CVA with chronic left-sided deficits, history of SAH/SDH  Seizure disorder: Continue Keppra.  Elevated troponins due to demand ischemia: No anginal symptoms-suspected demand ischemia due to hypoxia/PE-also with cocaine use.  Echo with preserved EF and no regional wall motion abnormality.  Doubt further work-up required.  AKI: Likely hemodynamically mediated-has resolved  HTN: BP stable-continue amlodipine  Chronic pain syndrome: Stable-on Neurontin/Percocet (home regimen)  Cocaine abuse: Has been counseled  Tobacco abuse: Continue transdermal nicotine  Deconditioning/debility: Due to acute illness-initially refused SNF-was subsequently discharged home on 4/5-however he change his mind and now wants to go to SNF.   Stage 1 sacrum pressure ulcer not present on admission   Diet: Diet Order            Diet - low sodium heart healthy           Diet heart healthy/carb modified Room service appropriate? Yes; Fluid consistency: Thin  Diet effective now                  Code Status: Full code   Family Communication: Spoke with daughter-[name]-[phone] phone on 4/4  Disposition Plan: Status is: Inpatient  Remains inpatient appropriate because:Inpatient level of care appropriate due to severity of illness   Dispo:  Patient From:    Planned Disposition: SNF  Medically stable for discharge:       Barriers to Discharge: Stable for discharge-insurance refused SNF-Per patient report-family to pick him up later today.  Has been discharged several days back.  Antimicrobial agents: Anti-infectives (From  admission, onward)   Start     Dose/Rate Route Frequency Ordered Stop   05/11/20 1030  cefdinir (OMNICEF) capsule 300 mg        300  mg Oral Every 12 hours 05/11/20 0937 05/15/20 2100   05/10/20 1000  ceFAZolin (ANCEF) IVPB 2g/100 mL premix  Status:  Discontinued        2 g 200 mL/hr over 30 Minutes Intravenous Every 8 hours 05/10/20 0905 05/11/20 0937   05/10/20 0800  vancomycin (VANCOREADY) IVPB 750 mg/150 mL  Status:  Discontinued        750 mg 150 mL/hr over 60 Minutes Intravenous Every 24 hours 05/09/20 0833 05/10/20 0859   05/09/20 1000  ceFEPIme (MAXIPIME) 2 g in sodium chloride 0.9 % 100 mL IVPB  Status:  Discontinued        2 g 200 mL/hr over 30 Minutes Intravenous Every 12 hours 05/09/20 0833 05/10/20 0859   05/09/20 0845  vancomycin (VANCOREADY) IVPB 1500 mg/300 mL        1,500 mg 150 mL/hr over 120 Minutes Intravenous  Once 05/09/20 1540 05/09/20 1220       Time spent: 15- minutes-Greater than 50% of this time was spent in counseling, explanation of diagnosis, planning of further management, and coordination of care.  MEDICATIONS: Scheduled Meds: . amLODipine  5 mg Oral Daily  . apixaban  5 mg Oral BID  . Chlorhexidine Gluconate Cloth  6 each Topical Daily  . gabapentin  400 mg Oral QHS  . levETIRAcetam  500 mg Oral BID  . mouth rinse  15 mL Mouth Rinse BID  . nicotine  21 mg Transdermal Daily  . polyethylene glycol  17 g Oral Once  . senna-docusate  2 tablet Oral BID   Continuous Infusions: PRN Meds:.docusate sodium, oxyCODONE-acetaminophen **AND** [DISCONTINUED] oxyCODONE, polyethylene glycol   PHYSICAL EXAM: Vital signs: Vitals:   05/29/20 0602 05/29/20 0814 05/29/20 2111 05/30/20 0555  BP: 120/70 117/87 116/83 125/60  Pulse: 85 80 89 90  Resp: 19 18 19  (!) 21  Temp: 98.7 F (37.1 C) 97.9 F (36.6 C) 97.9 F (36.6 C) 98.5 F (36.9 C)  TempSrc: Oral Oral Oral Oral  SpO2: 95% 93% (!) 88% 90%  Weight:      Height:       Filed Weights   05/16/20 0500 05/19/20 0500 05/21/20 0500  Weight: 67.4 kg 63.9 kg 64.4 kg   Body mass index is 22.24 kg/m.   Gen Exam:Alert awake-not in any  distress HEENT:atraumatic, normocephalic Chest: B/L clear to auscultation anteriorly CVS:S1S2 regular Abdomen:soft non tender, non distended Extremities:no edema Skin: no rash  I have personally reviewed following labs and imaging studies  LABORATORY DATA: CBC: Recent Labs  Lab 05/25/20 0841  WBC 5.5  HGB 12.8*  HCT 41.1  MCV 88.6  PLT 086    Basic Metabolic Panel: Recent Labs  Lab 05/25/20 0841  NA 138  K 4.1  CL 104  CO2 25  GLUCOSE 79  BUN 24*  CREATININE 1.17  CALCIUM 9.5    GFR: Estimated Creatinine Clearance: 51.2 mL/min (by C-G formula based on SCr of 1.17 mg/dL).  Liver Function Tests: Recent Labs  Lab 05/25/20 0841  AST 16  ALT 14  ALKPHOS 64  BILITOT 0.7  PROT 9.1*  ALBUMIN 3.0*   No results for input(s): LIPASE, AMYLASE in the last 168 hours. No results for input(s): AMMONIA in the last 168 hours.  Coagulation Profile: No results for input(s): INR, PROTIME in  the last 168 hours.  Cardiac Enzymes: No results for input(s): CKTOTAL, CKMB, CKMBINDEX, TROPONINI in the last 168 hours.  BNP (last 3 results) No results for input(s): PROBNP in the last 8760 hours.  Lipid Profile: No results for input(s): CHOL, HDL, LDLCALC, TRIG, CHOLHDL, LDLDIRECT in the last 72 hours.  Thyroid Function Tests: No results for input(s): TSH, T4TOTAL, FREET4, T3FREE, THYROIDAB in the last 72 hours.  Anemia Panel: No results for input(s): VITAMINB12, FOLATE, FERRITIN, TIBC, IRON, RETICCTPCT in the last 72 hours.  Urine analysis:    Component Value Date/Time   COLORURINE YELLOW 05/09/2020 0833   APPEARANCEUR CLEAR 05/09/2020 0833   LABSPEC 1.019 05/09/2020 0833   PHURINE 5.0 05/09/2020 0833   GLUCOSEU NEGATIVE 05/09/2020 0833   HGBUR SMALL (A) 05/09/2020 0833   BILIRUBINUR NEGATIVE 05/09/2020 0833   KETONESUR NEGATIVE 05/09/2020 0833   PROTEINUR NEGATIVE 05/09/2020 0833   UROBILINOGEN 0.2 05/20/2011 1327   NITRITE NEGATIVE 05/09/2020 0833    LEUKOCYTESUR NEGATIVE 05/09/2020 0833    Sepsis Labs: Lactic Acid, Venous    Component Value Date/Time   LATICACIDVEN 2.7 (HH) 05/09/2020 0654    MICROBIOLOGY: Recent Results (from the past 240 hour(s))  SARS CORONAVIRUS 2 (TAT 6-24 HRS) Nasopharyngeal Nasopharyngeal Swab     Status: None   Collection Time: 05/20/20 10:48 AM   Specimen: Nasopharyngeal Swab  Result Value Ref Range Status   SARS Coronavirus 2 NEGATIVE NEGATIVE Final    Comment: (NOTE) SARS-CoV-2 target nucleic acids are NOT DETECTED.  The SARS-CoV-2 RNA is generally detectable in upper and lower respiratory specimens during the acute phase of infection. Negative results do not preclude SARS-CoV-2 infection, do not rule out co-infections with other pathogens, and should not be used as the sole basis for treatment or other patient management decisions. Negative results must be combined with clinical observations, patient history, and epidemiological information. The expected result is Negative.  Fact Sheet for Patients: SugarRoll.be  Fact Sheet for Healthcare Providers: https://www.woods-mathews.com/  This test is not yet approved or cleared by the [place] FDA and  has been authorized for detection and/or diagnosis of SARS-CoV-2 by FDA under an Emergency Use Authorization (EUA). This EUA will remain  in effect (meaning this test can be used) for the duration of the COVID-19 declaration under Se ction 564(b)(1) of the Act, 21 U.S.C. section 360bbb-3(b)(1), unless the authorization is terminated or revoked sooner.  Performed at [facility], [place] [address], [place], [place] [address]     RADIOLOGY STUDIES/RESULTS: No results found.   LOS: 21 days   Oren Binet, MD  [facility]    To contact the attending provider between 7A-7P or the covering provider during after hours 7P-7A, please log into the web site [email or web address] and access using  universal [place] password for that web site. If you do not have the password, please call the hospital operator.  05/30/2020, 9:52 AM

## 2020-05-31 DIAGNOSIS — I2609 Other pulmonary embolism with acute cor pulmonale: Secondary | ICD-10-CM | POA: Diagnosis not present

## 2020-05-31 MED ORDER — LACTATED RINGERS IV SOLN: INTRAVENOUS | Status: AC

## 2020-05-31 NOTE — TOC Progression Note (Addendum)
Transition of Care [facility]) - Progression Note    Patient Details  Name: Brian [name] MRN: [id number] Date of Birth: Jan 19, 1946  Transition of Care [facility]) CM/SW [place], [place] Phone Number: 05/31/2020, 10:22 AM  Clinical Narrative:     CSW was notified that pt was supposed to DC yesterday w/ family, however, the nurse was advised that the family is now saying they cannot take him. CSW called sister, Brian [name], a voicemail was left. CSW attempted to call brother, but the number says disconnected. CSW spoke with pt who noted he had spoken to his dtr this morning, but she had been unclear about her plans. SW will continue to follow for DC plan.  12:15 CSW received call from pt's dtr, Brian [name] who noted that the plan was not for pt to go home with her, because she has many stairs to her house. CSW asked about the brother being able to take him home with him and if she had an updated phone number for him. Brian [name] advised that she does not have his number as he frequently disconnects numbers and she needs to wait for him to call her with new numbers. She also said that her brother and father have a toxic relationship, and that may not be the best solution.  CSW asked where pt had been living before and she stated he had been living with friends, but it was very dirty there and he fell multiple times. She really thinks he needs rehab, and doesn't understand why he was denied. She advised she would call her father and they could discuss next steps, she will follow up with CSW if they find a solution. SW will continue to follow for a safe transition of care.      Expected Discharge Plan and Services           Expected Discharge Date: 05/26/20               DME Arranged: Wheelchair manual DME Agency: [employer] Date DME Agency Contacted: 05/29/20 Time DME Agency Contacted: [phone] Representative spoke with at DME Agency: [place] Arranged: PT,OT,Social Work [place]: [place] Date Start: 05/25/20 Time [place]: 1600 Representative spoke with at [place]: Levada Dy   Social Determinants of Health ([place]) Interventions    Readmission Risk Interventions Readmission Risk Prevention Plan 04/03/2019 06/29/2018  Post Dischage Appt - Complete  Medication Screening - Complete  Transportation Screening Complete Complete  PCP or Specialist Appt within 5-7 Days Complete -  Home Care Screening Complete -  Medication Review (RN CM) Complete -  Some recent data might be hidden

## 2020-05-31 NOTE — Progress Notes (Signed)
PROGRESS NOTE        PATIENT DETAILS Name: Brian NARDOZZI Age: 75 y.o. Sex: male Date of Birth: [birth year] Admit Date: 05/09/2020 Admitting Physician Audria Nine, DO [email or web address]:[id number], [facility]  Brief Narrative: Patient is a 75 y.o. 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  Significant events: 3/19>> admit to ICU-PE-hypotension-AKI-sepsis-left groin/flank cellulitis. 3/21>> transfer to [facility] 4/5>> wanted to be discharged home-after refusing SNF-however changed his mind and wants to go to SNF  Significant studies: 3/19>> CTA chest: Bilateral PE 3/19>> bilateral lower extremity Doppler: Age indeterminate bilateral DVT 3/19>> CT head: No acute intracranial abnormality 3/19>> CT C-spine: No acute traumatic injury 3/19>> CT abdomen/pelvis: Stranding of fat in left flank, diverticulosis 3/19>> Echo: EF 65-70%, RVSP 45 mmHg 3/19>> EEG: Focal cerebral dysfunction in the right cerebral hemisphere-no seizures identified. 3/19>> UDS: Positive for cocaine  Antimicrobial therapy: Cefdinir: 3/21>> 3/25 Vancomycin: 3/19>> 3/20 Cefepime: 3/19 x 1  Microbiology data: 3/19>> blood culture: No growth  Procedures : None  Consults: PCCM  DVT Prophylaxis : apixaban (ELIQUIS) tablet 5 mg    Subjective:  Patient in bed, appears comfortable, denies any headache, no fever, no chest pain or pressure, no shortness of breath , no abdominal pain. No new focal weakness.  States that he would like to go home with his daughter.    Assessment/Plan:  Acute hypoxic respiratory failure due to lobar/segmental PE: Hypoxia has  resolved-initially on IV heparin-has been transitioned to Eliquis.  Bilateral lower extremity age indeterminate DVT: On anticoagulation.  Sepsis due to left flank/groin cellulitis: Sepsis physiology resolved-cultures negative-completed a course of antimicrobial therapy.  Acute metabolic encephalopathy: In the setting of sepsis/hypoxia-resolved.  Completely awake and alert.  History of CVA with chronic left-sided deficits, history of SAH/SDH  Seizure disorder: Continue Keppra.  Asymptomatic hypotension with clinical dehydration.  Hydrate with IV fluids for 15 hours on 05/31/2020.    Elevated troponins due to demand ischemia: No anginal symptoms-suspected demand ischemia due to hypoxia/PE-also with cocaine use.  Echo with preserved EF and no regional wall motion abnormality.  Doubt further work-up required.  AKI: Likely hemodynamically mediated-has resolved  HTN: BP stable-continue amlodipine  Chronic pain syndrome: Stable-on Neurontin/Percocet (home regimen)  Cocaine abuse: Has been counseled  Tobacco abuse: Continue transdermal nicotine  Deconditioning/debility: Due to acute illness-initially refused SNF-was subsequently discharged home on 4/5-however he change his mind and now wants to go to SNF.   Stage 1 sacrum pressure ulcer not present on admission   Diet: Diet Order            Diet - low sodium heart healthy           Diet heart healthy/carb modified Room service appropriate? Yes; Fluid consistency: Thin  Diet effective now                  Code Status: Full code   Family Communication: Spoke with daughter-[name] phone on 05/25/20, left message on 05/31/2020 at 8:20 AM  Disposition Plan: Status is: Inpatient  Remains inpatient appropriate because:Inpatient level of care appropriate due to  severity of illness   Dispo:  Patient From:    Planned Disposition: SNF  Medically stable for discharge:       Barriers to Discharge: Stable for  discharge-insurance refused SNF-Per patient report-family to pick him up later today.  Has been discharged several days back.  Antimicrobial agents: Anti-infectives (From admission, onward)   Start     Dose/Rate Route Frequency Ordered Stop   05/11/20 1030  cefdinir (OMNICEF) capsule 300 mg        300 mg Oral Every 12 hours 05/11/20 0937 05/15/20 2100   05/10/20 1000  ceFAZolin (ANCEF) IVPB 2g/100 mL premix  Status:  Discontinued        2 g 200 mL/hr over 30 Minutes Intravenous Every 8 hours 05/10/20 0905 05/11/20 0937   05/10/20 0800  vancomycin (VANCOREADY) IVPB 750 mg/150 mL  Status:  Discontinued        750 mg 150 mL/hr over 60 Minutes Intravenous Every 24 hours 05/09/20 0833 05/10/20 0859   05/09/20 1000  ceFEPIme (MAXIPIME) 2 g in sodium chloride 0.9 % 100 mL IVPB  Status:  Discontinued        2 g 200 mL/hr over 30 Minutes Intravenous Every 12 hours 05/09/20 0833 05/10/20 0859   05/09/20 0845  vancomycin (VANCOREADY) IVPB 1500 mg/300 mL        1,500 mg 150 mL/hr over 120 Minutes Intravenous  Once 05/09/20 6045 05/09/20 1220       Time spent: 15- minutes-Greater than 50% of this time was spent in counseling, explanation of diagnosis, planning of further management, and coordination of care.  MEDICATIONS: Scheduled Meds: . amLODipine  5 mg Oral Daily  . apixaban  5 mg Oral BID  . Chlorhexidine Gluconate Cloth  6 each Topical Daily  . gabapentin  400 mg Oral QHS  . levETIRAcetam  500 mg Oral BID  . mouth rinse  15 mL Mouth Rinse BID  . nicotine  21 mg Transdermal Daily  . polyethylene glycol  17 g Oral Once  . senna-docusate  2 tablet Oral BID   Continuous Infusions: PRN Meds:.docusate sodium, oxyCODONE-acetaminophen **AND** [DISCONTINUED] oxyCODONE, polyethylene glycol   PHYSICAL EXAM: Vital signs: Vitals:   05/30/20 0555 05/30/20 1354 05/30/20 2035 05/31/20 0345  BP: 125/60 97/77 119/88 99/76  Pulse: 90 82 84 82  Resp: (!) 21 16 18 16   Temp: 98.5 F (36.9 C)  98.4 F (36.9 C) 98 F (36.7 C) 97.8 F (36.6 C)  TempSrc: Oral Oral  Oral  SpO2: 90% 96% 98% 98%  Weight:      Height:       Filed Weights   05/16/20 0500 05/19/20 0500 05/21/20 0500  Weight: 67.4 kg 63.9 kg 64.4 kg   Body mass index is 22.24 kg/m.   Gen Exam:  Awake Alert, No new F.N deficits,   [place].AT,PERRAL Supple Neck,No JVD, No cervical lymphadenopathy appriciated.  Symmetrical Chest wall movement, Good air movement bilaterally, CTAB RRR,No Gallops, Rubs or new Murmurs, No Parasternal Heave +ve B.Sounds, Abd Soft, No tenderness, No organomegaly appriciated, No rebound - guarding or rigidity. No Cyanosis, Clubbing or edema, No new Rash or bruise   I have personally reviewed following labs and imaging studies  LABORATORY DATA: CBC: Recent Labs  Lab 05/25/20 0841  WBC 5.5  HGB 12.8*  HCT 41.1  MCV 88.6  PLT 409    Basic Metabolic Panel: Recent Labs  Lab 05/25/20 0841  NA 138  K 4.1  CL 104  CO2 25  GLUCOSE 79  BUN 24*  CREATININE 1.17  CALCIUM 9.5    GFR: Estimated Creatinine Clearance: 51.2 mL/min (by C-G formula based on SCr of 1.17 mg/dL).  Liver Function Tests: Recent Labs  Lab 05/25/20 0841  AST 16  ALT 14  ALKPHOS 64  BILITOT 0.7  PROT 9.1*  ALBUMIN 3.0*   No results for input(s): LIPASE, AMYLASE in the last 168 hours. No results for input(s): AMMONIA in the last 168 hours.  Coagulation Profile: No results for input(s): INR, PROTIME in the last 168 hours.  Cardiac Enzymes: No results for input(s): CKTOTAL, CKMB, CKMBINDEX, TROPONINI in the last 168 hours.  BNP (last 3 results) No results for input(s): PROBNP in the last 8760 hours.  Lipid Profile: No results for input(s): CHOL, HDL, LDLCALC, TRIG, CHOLHDL, LDLDIRECT in the last 72 hours.  Thyroid Function Tests: No results for input(s): TSH, T4TOTAL, FREET4, T3FREE, THYROIDAB in the last 72 hours.  Anemia Panel: No results for input(s): VITAMINB12, FOLATE, FERRITIN, TIBC,  IRON, RETICCTPCT in the last 72 hours.  Urine analysis:    Component Value Date/Time   COLORURINE YELLOW 05/09/2020 0833   APPEARANCEUR CLEAR 05/09/2020 0833   LABSPEC 1.019 05/09/2020 0833   PHURINE 5.0 05/09/2020 0833   GLUCOSEU NEGATIVE 05/09/2020 0833   HGBUR SMALL (A) 05/09/2020 0833   BILIRUBINUR NEGATIVE 05/09/2020 0833   KETONESUR NEGATIVE 05/09/2020 0833   PROTEINUR NEGATIVE 05/09/2020 0833   UROBILINOGEN 0.2 05/20/2011 1327   NITRITE NEGATIVE 05/09/2020 0833   LEUKOCYTESUR NEGATIVE 05/09/2020 0833    Sepsis Labs: Lactic Acid, Venous    Component Value Date/Time   LATICACIDVEN 2.7 (HH) 05/09/2020 0654    MICROBIOLOGY: No results found for this or any previous visit (from the past 240 hour(s)).  RADIOLOGY STUDIES/RESULTS: No results found.   LOS: 22 days   Lala Lund, MD  [facility]  05/31/2020, 8:21 AM

## 2020-05-31 NOTE — Plan of Care (Signed)

## 2020-06-01 ENCOUNTER — Other Ambulatory Visit (HOSPITAL_COMMUNITY): Payer: Self-pay

## 2020-06-01 DIAGNOSIS — I2609 Other pulmonary embolism with acute cor pulmonale: Secondary | ICD-10-CM | POA: Diagnosis not present

## 2020-06-01 DIAGNOSIS — I2699 Other pulmonary embolism without acute cor pulmonale: Secondary | ICD-10-CM | POA: Diagnosis not present

## 2020-06-01 LAB — CBC WITH DIFFERENTIAL/PLATELET
Abs Immature Granulocytes: 0.02 10*3/uL (ref 0.00–0.07)
Basophils Absolute: 0.1 10*3/uL (ref 0.0–0.1)
Basophils Relative: 1 %
Eosinophils Absolute: 0.3 10*3/uL (ref 0.0–0.5)
Eosinophils Relative: 5 %
HCT: 34.5 % — ABNORMAL LOW (ref 39.0–52.0)
Hemoglobin: 10.7 g/dL — ABNORMAL LOW (ref 13.0–17.0)
Immature Granulocytes: 0 %
Lymphocytes Relative: 49 %
Lymphs Abs: 3.2 10*3/uL (ref 0.7–4.0)
MCH: 28.1 pg (ref 26.0–34.0)
MCHC: 31 g/dL (ref 30.0–36.0)
MCV: 90.6 fL (ref 80.0–100.0)
Monocytes Absolute: 0.6 10*3/uL (ref 0.1–1.0)
Monocytes Relative: 8 %
Neutro Abs: 2.5 10*3/uL (ref 1.7–7.7)
Neutrophils Relative %: 37 %
Platelets: 151 10*3/uL (ref 150–400)
RBC: 3.81 MIL/uL — ABNORMAL LOW (ref 4.22–5.81)
RDW: 13.8 % (ref 11.5–15.5)
WBC: 6.6 10*3/uL (ref 4.0–10.5)
nRBC: 0 % (ref 0.0–0.2)

## 2020-06-01 LAB — COMPREHENSIVE METABOLIC PANEL
ALT: 14 U/L (ref 0–44)
AST: 15 U/L (ref 15–41)
Albumin: 2.5 g/dL — ABNORMAL LOW (ref 3.5–5.0)
Alkaline Phosphatase: 66 U/L (ref 38–126)
Anion gap: 7 (ref 5–15)
BUN: 24 mg/dL — ABNORMAL HIGH (ref 8–23)
CO2: 23 mmol/L (ref 22–32)
Calcium: 8.6 mg/dL — ABNORMAL LOW (ref 8.9–10.3)
Chloride: 106 mmol/L (ref 98–111)
Creatinine, Ser: 1.21 mg/dL (ref 0.61–1.24)
GFR, Estimated: >60 mL/min (ref >60)
Glucose, Bld: 91 mg/dL (ref 70–99)
Potassium: 3.9 mmol/L (ref 3.5–5.1)
Sodium: 136 mmol/L (ref 135–145)
Total Bilirubin: 0.5 mg/dL (ref 0.3–1.2)
Total Protein: 7 g/dL (ref 6.5–8.1)

## 2020-06-01 LAB — PROCALCITONIN: Procalcitonin: <0.1 ng/mL

## 2020-06-01 LAB — MAGNESIUM: Magnesium: 1.7 mg/dL (ref 1.7–2.4)

## 2020-06-01 LAB — BRAIN NATRIURETIC PEPTIDE: B Natriuretic Peptide: 15.7 pg/mL (ref 0.0–100.0)

## 2020-06-01 NOTE — Progress Notes (Signed)
Discharge instructions (including medications) discussed with and copy provided to patient/caregiver 

## 2020-06-01 NOTE — TOC Transition Note (Signed)
Transition of Care [facility]) - CM/SW Discharge Note   Patient Details  Name: Brian [name] MRN: [id number] Date of Birth: 1945/10/08  [facility]) CM/SW Contact:  Joanne Chars, LCSW Phone Number: 06/01/2020, 1:37 PM   Clinical Narrative:  Pt discharging with [facility].  Wheelchair provided by [employer], is in the room currently.   Earlier today activities: CSW spoke with daughter Denman George who has identified a family friend that pt will be staying with at the following address:  [address] [place], [place] [address].  Denman George will be picking pt up from the hospital today.  CSW spoke with Sharyn Lull in the [place] office at [facility].  [phone].  She confirmed that Solon Augusta will continue as pt [place] aide and can resume services tomorrow.  CSW provided the new address. CSW LM with Levada Dy at [place] and provided the new address.   RN will locate pt meds, which were brought to floor last week.         Final next level of care: Home w Home Health Services Barriers to Discharge: Barriers Resolved   Patient Goals and CMS Choice        Discharge Placement                  Name of family member notified: Denman George, daughter Patient and family notified of of transfer: 06/01/20  Discharge Plan and Services                DME Arranged: Wheelchair manual DME Agency: [employer] Date DME Agency Contacted: 05/29/20 Time DME Agency Contacted: [phone] Representative spoke with at DME Agency: [place]: PT,OT,Social Work [place]: [place] Date [place]: 05/25/20 Time [place]: 1600 Representative spoke with at [place]: [place] ([place]) Interventions     Readmission Risk Interventions Readmission Risk Prevention Plan 04/03/2019 06/29/2018  Post Dischage Appt - Complete  Medication Screening - Complete  Transportation Screening Complete Complete  PCP or Specialist Appt  within 5-7 Days Complete -  Home Care Screening Complete -  Medication Review (RN CM) Complete -  Some recent data might be hidden

## 2020-06-01 NOTE — Progress Notes (Signed)
Physical Therapy Treatment Patient Details Name: Brian [name] MRN: [id number] DOB: 05-Jul-1945 Today's Date: 06/01/2020    History of Present Illness 75 year old gentleman admitted with multiple falls and somnolence.Pt  states he might have taken double dose of tramadol along with gabapentin. On arrival to ED, pt hypotensive with blood pressure of 71/48.  Resuscitated with fluid.  Found to have AKI, bilateral PE.   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 Pt continues to make some progress however continues to need 24 hour care for safety and family is aware of this. Pt lacks safety awareness and is not independent from wheelchair level. REcommend HH therapies as much as possible.  Pt currently with functional limitations due to balance and endurance deficits. Pt will benefit from skilled PT to increase their independence and safety with mobility to allow discharge to the venue listed below.     Follow Up Recommendations  SNF;Home health PT (HHPT as pt was denied SNF. Pt will need assistance for all out of bed mobility, physical assistance for any OOB mobility.)     Equipment Recommendations  Wheelchair (measurements PT);Wheelchair cushion (measurements PT)    Recommendations for Other Services       Precautions / Restrictions Precautions Precautions: Fall Restrictions Weight Bearing Restrictions: No    Mobility  Bed Mobility Overal bed mobility: Needs Assistance Bed Mobility: Supine to Sit Rolling: Supervision Sidelying to sit: Supervision Supine to sit: Supervision     General bed mobility comments: Pt to EOB without assit but took incr time.    Transfers Overall transfer level: Needs assistance   Transfers: Squat Pivot  Transfers     Squat pivot transfers: Min guard;Min assist     General transfer comment: Asked pt to instruct PT how to set up wheelchair. Pt was able to instruct PT to move the chair beside bed.  He told PT to lock brakes but PT asked him to do so.  He was able to lock them but had difficulty removing the extended brake handle as he needs it removed to perform transfer.  He also needed assist to remove the armrest as well as cues to remember to remove. Pt was able to perform squat pivot with min guard assist and no physical assist.  Pt needed help placing the armrest back on and replacing foot rests.  Pt did unlock brakes and propel chair a little. Cannot use left UE well for propulsion.  Ambulation/Gait                 Theme park manager propulsion: Both upper extremities;Right lower extremity Wheelchair parts: Supervision/cueing Distance: 25 Wheelchair Assistance Details (indicate cue type and reason): assistance managing left foot rest and cues for managing brakes; cues for turning in tight space in room;  Pointed out concerns re: pt using a wheelchair in his home and he stated he would mostly be walking and holding onto furniture as he has done previously. Again explained incr fall risk with this approach, which pt refuses to acknowledge or accept.  Modified Rankin (Stroke Patients Only)       Balance Overall balance assessment: Needs assistance Sitting-balance support: No upper extremity supported;Feet supported Sitting balance-Leahy  Scale: Fair Sitting balance - Comments: min guard  A for sitting balance seated EOB   Standing balance support: Bilateral upper extremity supported Standing balance-Leahy Scale: Poor Standing balance comment: min A with RW statically                            Cognition Arousal/Alertness: Awake/alert Behavior During Therapy: Impulsive Overall Cognitive Status: No  family/caregiver present to determine baseline cognitive functioning Area of Impairment: Safety/judgement;Problem solving;Awareness                     Memory: Decreased recall of precautions;Decreased short-term memory Following Commands: Follows one step commands consistently Safety/Judgement: Decreased awareness of safety;Decreased awareness of deficits Awareness: Intellectual Problem Solving: Slow processing General Comments: Pt requiring assist for safe mobility and ADL.      Exercises General Exercises - Upper Extremity Shoulder Flexion: AROM;Both;Supine;10 reps Shoulder Extension: AROM;Supine;Both;10 reps Elbow Flexion: AROM;Both;Supine;10 reps Elbow Extension: AROM;Both;Supine General Exercises - Lower Extremity Ankle Circles/Pumps: AROM;Both;10 reps;Seated Long Arc Quad: AROM;Both;10 reps;Seated Hip Flexion/Marching: AROM;Both;5 reps;Supine    General Comments General comments (skin integrity, edema, etc.): VSS on RA      Pertinent Vitals/Pain Pain Assessment: No/denies pain    Home Living                      Prior Function            PT Goals (current goals can now be found in the care plan section) Acute Rehab PT Goals Patient Stated Goal: to go home Progress towards PT goals: Progressing toward goals    Frequency    Min 3X/week      PT Plan Current plan remains appropriate    Co-evaluation              AM-PAC PT "6 Clicks" Mobility   Outcome Measure  Help needed turning from your back to your side while in a flat bed without using bedrails?: None Help needed moving from lying on your back to sitting on the side of a flat bed without using bedrails?: A Little Help needed moving to and from a bed to a chair (including a wheelchair)?: A Little Help needed standing up from a chair using your arms (e.g., wheelchair or bedside chair)?: A Little Help needed to walk in hospital room?: A Lot Help needed climbing 3-5 steps with a  railing? : Total 6 Click Score: 16    End of Session Equipment Utilized During Treatment: Gait belt Activity Tolerance: Patient limited by fatigue Patient left: in chair;with call bell/phone within reach;with chair alarm set (Pt in his wheelchair) Nurse Communication: Mobility status PT Visit Diagnosis: Unsteadiness on feet (R26.81);Muscle weakness (generalized) (M62.81)     Time: 6812-7517 PT Time Calculation (min) (ACUTE ONLY): 23 min  Charges:  $Therapeutic Exercise: 8-22 mins $Therapeutic Activity: 8-22 mins                     Terron Merfeld M,PT Acute Rehab Services [phone] (pager)   Alvira Philips 06/01/2020, 1:39 PM

## 2020-06-01 NOTE — Discharge Instructions (Signed)
[facility] with [facility] [place] in 7 days   Get CBC, CMP, 2 view Chest X ray -  checked next visit within 1 week by Primary [facility]   Activity: As tolerated with Full fall precautions use walker/cane & assistance as needed  Disposition Home    Diet: Heart Healthy Low Carb  Special Instructions: If you have smoked or chewed Tobacco  in the last 2 yrs please stop smoking, stop any regular Alcohol  and or any Recreational drug use.  On your next visit with your primary care physician please Get Medicines reviewed and adjusted.  Please request your Prim.MD to go over all Hospital Tests and Procedure/Radiological results at the follow up, please get all Hospital records sent to your Prim MD by signing hospital release before you go home.  If you experience worsening of your admission symptoms, develop shortness of breath, life threatening emergency, suicidal or homicidal thoughts you must seek medical attention immediately by calling 911 or calling your MD immediately  if symptoms less severe.  You Must read complete instructions/literature along with all the possible adverse reactions/side effects for all the Medicines you take and that have been prescribed to you. Take any new Medicines after you have completely understood and accpet all the possible adverse reactions/side effects.    Information on my medicine - ELIQUIS (apixaban)  Why was Eliquis prescribed for you? Eliquis was prescribed to treat blood clots that may have been found in the veins of your legs (deep vein thrombosis) or in your lungs (pulmonary embolism) and to reduce the risk of them occurring again.  What do You need to know about Eliquis ? The starting dose is 10 mg (two 5 mg tablets) taken TWICE daily for the FIRST SEVEN (7) DAYS, then on 05/17/2020  the dose is reduced to ONE 5 mg tablet taken TWICE daily.  Eliquis may be taken with or without food.   Try to take the dose about the  same time in the morning and in the evening. If you have difficulty swallowing the tablet whole please discuss with your pharmacist how to take the medication safely.  Take Eliquis exactly as prescribed and DO NOT stop taking Eliquis without talking to the doctor who prescribed the medication.  Stopping may increase your risk of developing a new blood clot.  Refill your prescription before you run out.  After discharge, you should have regular check-up appointments with your healthcare provider that is prescribing your Eliquis.    What do you do if you miss a dose? If a dose of ELIQUIS is not taken at the scheduled time, take it as soon as possible on the same day and twice-daily administration should be resumed. The dose should not be doubled to make up for a missed dose.  Important Safety Information A possible side effect of Eliquis is bleeding. You should call your healthcare provider right away if you experience any of the following: ? Bleeding from an injury or your nose that does not stop. ? Unusual colored urine (red or dark brown) or unusual colored stools (red or black). ? Unusual bruising for unknown reasons. ? A serious fall or if you hit your head (even if there is no bleeding).  Some medicines may interact with Eliquis and might increase your risk of bleeding or clotting while on Eliquis. To help avoid this, consult your healthcare provider or pharmacist prior to using any new prescription or non-prescription medications, including herbals, vitamins, non-steroidal  anti-inflammatory drugs (NSAIDs) and supplements.  This website has more information on Eliquis (apixaban): http://[email or web address].com/eliquis/home

## 2020-06-01 NOTE — Progress Notes (Signed)
PROGRESS NOTE        PATIENT DETAILS Name: Brian [name] Age: 75 y.o. Sex: male Date of Birth: [birth year] Admit Date: 05/09/2020 Admitting Physician Audria Nine, DO VOJ:[id number], [facility]  Brief Narrative: Patient is a 75 y.o. 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  Significant events: 3/19>> admit to ICU-PE-hypotension-AKI-sepsis-left groin/flank cellulitis. 3/21>> transfer to [facility] 4/5>> wanted to be discharged home-after refusing SNF-however changed his mind and wants to go to SNF  Significant studies: 3/19>> CTA chest: Bilateral PE 3/19>> bilateral lower extremity Doppler: Age indeterminate bilateral DVT 3/19>> CT head: No acute intracranial abnormality 3/19>> CT C-spine: No acute traumatic injury 3/19>> CT abdomen/pelvis: Stranding of fat in left flank, diverticulosis 3/19>> Echo: EF 65-70%, RVSP 45 mmHg 3/19>> EEG: Focal cerebral dysfunction in the right cerebral hemisphere-no seizures identified. 3/19>> UDS: Positive for cocaine  Antimicrobial therapy: Cefdinir: 3/21>> 3/25 Vancomycin: 3/19>> 3/20 Cefepime: 3/19 x 1  Microbiology data: 3/19>> blood culture: No growth  Procedures : None  Consults: PCCM  DVT Prophylaxis : apixaban (ELIQUIS) tablet 5 mg    Patient is stable for DC - needs placement   Subjective:  Patient in bed, appears comfortable, denies any headache, no fever, no chest pain or pressure, no shortness of breath , no abdominal pain. No new focal weakness.   Assessment/Plan:  Acute hypoxic respiratory failure due to lobar/segmental PE: Hypoxia has  resolved-initially on IV heparin-has been transitioned to Eliquis.  Bilateral lower extremity age indeterminate DVT: On anticoagulation.  Sepsis due to left flank/groin cellulitis: Sepsis physiology resolved-cultures negative-completed a course of antimicrobial therapy.  Acute metabolic encephalopathy: In the setting of sepsis/hypoxia-resolved.  Completely awake and alert.  History of CVA with chronic left-sided deficits, history of SAH/SDH  Seizure disorder: Continue Keppra.  Asymptomatic hypotension with clinical dehydration.  Hydrate with IV fluids for 15 hours on 05/31/2020.    Elevated troponins due to demand ischemia: No anginal symptoms-suspected demand ischemia due to hypoxia/PE-also with cocaine use.  Echo with preserved EF and no regional wall motion abnormality.  Doubt further work-up required.  AKI: Likely hemodynamically mediated-has resolved  HTN: BP stable-continue amlodipine  Chronic pain syndrome: Stable-on Neurontin/Percocet (home regimen)  Cocaine abuse: Has been counseled  Tobacco abuse: Continue transdermal nicotine  Deconditioning/debility: Due to acute illness-initially refused SNF-was subsequently discharged home on 4/5-however he change his mind and now wants to go to SNF.   Stage 1 sacrum pressure ulcer not present on admission   Diet: Diet Order            Diet - low sodium heart healthy           Diet heart healthy/carb modified Room service appropriate? Yes; Fluid consistency: Thin  Diet effective now                  Code Status: Full code   Family Communication: Spoke with daughter-[name]-[phone]-over phone on 05/25/20, left message on 05/31/2020 at 8:20 AM  Disposition Plan: Status is: Inpatient  Remains inpatient appropriate because:Inpatient level of care appropriate due to severity of illness  Dispo:  Patient From:    Planned Disposition: SNF  Medically stable for discharge:       Barriers to Discharge: Stable for  discharge-insurance refused SNF-Per patient report-family to pick him up later today.  Has been discharged several days back.  Antimicrobial agents: Anti-infectives (From admission, onward)   Start     Dose/Rate Route Frequency Ordered Stop   05/11/20 1030  cefdinir (OMNICEF) capsule 300 mg        300 mg Oral Every 12 hours 05/11/20 0937 05/15/20 2100   05/10/20 1000  ceFAZolin (ANCEF) IVPB 2g/100 mL premix  Status:  Discontinued        2 g 200 mL/hr over 30 Minutes Intravenous Every 8 hours 05/10/20 0905 05/11/20 0937   05/10/20 0800  vancomycin (VANCOREADY) IVPB 750 mg/150 mL  Status:  Discontinued        750 mg 150 mL/hr over 60 Minutes Intravenous Every 24 hours 05/09/20 0833 05/10/20 0859   05/09/20 1000  ceFEPIme (MAXIPIME) 2 g in sodium chloride 0.9 % 100 mL IVPB  Status:  Discontinued        2 g 200 mL/hr over 30 Minutes Intravenous Every 12 hours 05/09/20 0833 05/10/20 0859   05/09/20 0845  vancomycin (VANCOREADY) IVPB 1500 mg/300 mL        1,500 mg 150 mL/hr over 120 Minutes Intravenous  Once 05/09/20 8413 05/09/20 1220       Time spent: 15- minutes-Greater than 50% of this time was spent in counseling, explanation of diagnosis, planning of further management, and coordination of care.  MEDICATIONS: Scheduled Meds: . amLODipine  5 mg Oral Daily  . apixaban  5 mg Oral BID  . Chlorhexidine Gluconate Cloth  6 each Topical Daily  . gabapentin  400 mg Oral QHS  . levETIRAcetam  500 mg Oral BID  . mouth rinse  15 mL Mouth Rinse BID  . nicotine  21 mg Transdermal Daily  . polyethylene glycol  17 g Oral Once  . senna-docusate  2 tablet Oral BID   Continuous Infusions: PRN Meds:.docusate sodium, oxyCODONE-acetaminophen **AND** [DISCONTINUED] oxyCODONE, polyethylene glycol   PHYSICAL EXAM: Vital signs: Vitals:   05/30/20 2035 05/31/20 0345 05/31/20 1456 05/31/20 2128  BP: 119/88 99/76 103/76 138/90  Pulse: 84 82 86 91  Resp: 18 16 16 16   Temp: 98 F (36.7 C) 97.8 F  (36.6 C) 98.1 F (36.7 C) 98.2 F (36.8 C)  TempSrc:  Oral Oral Oral  SpO2: 98% 98% 100% 96%  Weight:      Height:       Filed Weights   05/16/20 0500 05/19/20 0500 05/21/20 0500  Weight: 67.4 kg 63.9 kg 64.4 kg   Body mass index is 22.24 kg/m.   Gen Exam:  Awake Alert, No new F.N deficits, Normal affect [place].AT,PERRAL Supple Neck,No JVD, No cervical lymphadenopathy appriciated.  Symmetrical Chest wall movement, Good air movement bilaterally, CTAB RRR,No Gallops, Rubs or new Murmurs, No Parasternal Heave +ve B.Sounds, Abd Soft, No tenderness, No organomegaly appriciated, No rebound - guarding or rigidity. No Cyanosis, Clubbing or edema, No new Rash or bruise   I have personally reviewed following labs and imaging studies  LABORATORY DATA: CBC: Recent Labs  Lab 05/25/20 0841 06/01/20 0208  WBC 5.5 6.6  NEUTROABS  --  2.5  HGB 12.8* 10.7*  HCT 41.1 34.5*  MCV 88.6 90.6  PLT 245 244    Basic Metabolic Panel: Recent Labs  Lab 05/25/20 0841 06/01/20 0208  NA 138  136  K 4.1 3.9  CL 104 106  CO2 25 23  GLUCOSE 79 91  BUN 24* 24*  CREATININE 1.17 1.21  CALCIUM 9.5 8.6*  MG  --  1.7    GFR: Estimated Creatinine Clearance: 49.5 mL/min (by C-G formula based on SCr of 1.21 mg/dL).  Liver Function Tests: Recent Labs  Lab 05/25/20 0841 06/01/20 0208  AST 16 15  ALT 14 14  ALKPHOS 64 66  BILITOT 0.7 0.5  PROT 9.1* 7.0  ALBUMIN 3.0* 2.5*   No results for input(s): LIPASE, AMYLASE in the last 168 hours. No results for input(s): AMMONIA in the last 168 hours.  Coagulation Profile: No results for input(s): INR, PROTIME in the last 168 hours.  Cardiac Enzymes: No results for input(s): CKTOTAL, CKMB, CKMBINDEX, TROPONINI in the last 168 hours.  BNP (last 3 results) No results for input(s): PROBNP in the last 8760 hours.  Lipid Profile: No results for input(s): CHOL, HDL, LDLCALC, TRIG, CHOLHDL, LDLDIRECT in the last 72 hours.  Thyroid Function  Tests: No results for input(s): TSH, T4TOTAL, FREET4, T3FREE, THYROIDAB in the last 72 hours.  Anemia Panel: No results for input(s): VITAMINB12, FOLATE, FERRITIN, TIBC, IRON, RETICCTPCT in the last 72 hours.  Urine analysis:    Component Value Date/Time   COLORURINE YELLOW 05/09/2020 0833   APPEARANCEUR CLEAR 05/09/2020 0833   LABSPEC 1.019 05/09/2020 0833   PHURINE 5.0 05/09/2020 0833   GLUCOSEU NEGATIVE 05/09/2020 0833   HGBUR SMALL (A) 05/09/2020 0833   BILIRUBINUR NEGATIVE 05/09/2020 0833   KETONESUR NEGATIVE 05/09/2020 0833   PROTEINUR NEGATIVE 05/09/2020 0833   UROBILINOGEN 0.2 05/20/2011 1327   NITRITE NEGATIVE 05/09/2020 0833   LEUKOCYTESUR NEGATIVE 05/09/2020 0833    Sepsis Labs: Lactic Acid, Venous    Component Value Date/Time   LATICACIDVEN 2.7 (HH) 05/09/2020 0654    MICROBIOLOGY: No results found for this or any previous visit (from the past 240 hour(s)).  RADIOLOGY STUDIES/RESULTS: No results found.   LOS: 23 days   Lala Lund, MD  [facility]  06/01/2020, 8:18 AM

## 2020-06-01 NOTE — Discharge Summary (Signed)
Patient is now being discharged home, daughter coming to pick him up.  Kindly see discharge summary done 2 days ago for all details.  Today's progress note in place.  Patient symptom-free vital signs stable.  Proceed to discharge.

## 2020-06-01 NOTE — Plan of Care (Signed)

## 2020-06-01 NOTE — Progress Notes (Signed)
Patient left floor via wheelchair accompanied by staff. Patients home medications sent with him.  No c/o pain or shortness of breath at d/c.  Charman Blasco, Tivis Ringer, RN

## 2020-06-01 NOTE — Plan of Care (Signed)
  Problem: Education: Goal: Knowledge of General Education information will improve Description: Including pain rating scale, medication(s)/side effects and non-pharmacologic comfort measures Outcome: Adequate for Discharge   

## 2020-06-06 DIAGNOSIS — I69354 Hemiplegia and hemiparesis following cerebral infarction affecting left non-dominant side: Secondary | ICD-10-CM | POA: Diagnosis not present

## 2020-06-06 DIAGNOSIS — L03314 Cellulitis of groin: Secondary | ICD-10-CM | POA: Diagnosis not present

## 2020-06-06 DIAGNOSIS — I5032 Chronic diastolic (congestive) heart failure: Secondary | ICD-10-CM | POA: Diagnosis not present

## 2020-06-06 DIAGNOSIS — G9341 Metabolic encephalopathy: Secondary | ICD-10-CM | POA: Diagnosis not present

## 2020-06-06 DIAGNOSIS — N179 Acute kidney failure, unspecified: Secondary | ICD-10-CM | POA: Diagnosis not present

## 2020-06-06 DIAGNOSIS — I2699 Other pulmonary embolism without acute cor pulmonale: Secondary | ICD-10-CM | POA: Diagnosis not present

## 2020-06-06 DIAGNOSIS — I824Z3 Acute embolism and thrombosis of unspecified deep veins of distal lower extremity, bilateral: Secondary | ICD-10-CM | POA: Diagnosis not present

## 2020-06-06 DIAGNOSIS — I110 Hypertensive heart disease with heart failure: Secondary | ICD-10-CM | POA: Diagnosis not present

## 2020-06-06 DIAGNOSIS — G894 Chronic pain syndrome: Secondary | ICD-10-CM | POA: Diagnosis not present

## 2020-06-06 DIAGNOSIS — L03311 Cellulitis of abdominal wall: Secondary | ICD-10-CM | POA: Diagnosis not present

## 2020-06-09 DIAGNOSIS — N179 Acute kidney failure, unspecified: Secondary | ICD-10-CM | POA: Diagnosis not present

## 2020-06-09 DIAGNOSIS — L03314 Cellulitis of groin: Secondary | ICD-10-CM | POA: Diagnosis not present

## 2020-06-09 DIAGNOSIS — I110 Hypertensive heart disease with heart failure: Secondary | ICD-10-CM | POA: Diagnosis not present

## 2020-06-09 DIAGNOSIS — I2699 Other pulmonary embolism without acute cor pulmonale: Secondary | ICD-10-CM | POA: Diagnosis not present

## 2020-06-09 DIAGNOSIS — I69354 Hemiplegia and hemiparesis following cerebral infarction affecting left non-dominant side: Secondary | ICD-10-CM | POA: Diagnosis not present

## 2020-06-09 DIAGNOSIS — I5032 Chronic diastolic (congestive) heart failure: Secondary | ICD-10-CM | POA: Diagnosis not present

## 2020-06-09 DIAGNOSIS — G894 Chronic pain syndrome: Secondary | ICD-10-CM | POA: Diagnosis not present

## 2020-06-09 DIAGNOSIS — G9341 Metabolic encephalopathy: Secondary | ICD-10-CM | POA: Diagnosis not present

## 2020-06-09 DIAGNOSIS — I824Z3 Acute embolism and thrombosis of unspecified deep veins of distal lower extremity, bilateral: Secondary | ICD-10-CM | POA: Diagnosis not present

## 2020-06-09 DIAGNOSIS — L03311 Cellulitis of abdominal wall: Secondary | ICD-10-CM | POA: Diagnosis not present

## 2020-06-23 DIAGNOSIS — R404 Transient alteration of awareness: Secondary | ICD-10-CM | POA: Diagnosis not present

## 2020-06-23 DIAGNOSIS — R5381 Other malaise: Secondary | ICD-10-CM | POA: Diagnosis not present

## 2020-06-28 DIAGNOSIS — I950 Idiopathic hypotension: Secondary | ICD-10-CM | POA: Diagnosis not present

## 2020-06-28 DIAGNOSIS — G40909 Epilepsy, unspecified, not intractable, without status epilepticus: Secondary | ICD-10-CM | POA: Diagnosis not present

## 2020-06-28 DIAGNOSIS — R519 Headache, unspecified: Secondary | ICD-10-CM | POA: Diagnosis not present

## 2020-06-28 DIAGNOSIS — R55 Syncope and collapse: Secondary | ICD-10-CM | POA: Diagnosis not present

## 2020-06-28 DIAGNOSIS — Z8673 Personal history of transient ischemic attack (TIA), and cerebral infarction without residual deficits: Secondary | ICD-10-CM | POA: Diagnosis not present

## 2020-06-28 DIAGNOSIS — R69 Illness, unspecified: Secondary | ICD-10-CM | POA: Diagnosis not present

## 2020-06-28 DIAGNOSIS — R569 Unspecified convulsions: Secondary | ICD-10-CM | POA: Diagnosis not present

## 2020-06-28 DIAGNOSIS — Z86718 Personal history of other venous thrombosis and embolism: Secondary | ICD-10-CM | POA: Diagnosis not present

## 2020-06-28 DIAGNOSIS — Z86711 Personal history of pulmonary embolism: Secondary | ICD-10-CM | POA: Diagnosis not present

## 2020-06-29 DIAGNOSIS — R2689 Other abnormalities of gait and mobility: Secondary | ICD-10-CM | POA: Diagnosis not present

## 2020-07-09 DIAGNOSIS — E785 Hyperlipidemia, unspecified: Secondary | ICD-10-CM | POA: Diagnosis not present

## 2020-07-09 DIAGNOSIS — R569 Unspecified convulsions: Secondary | ICD-10-CM | POA: Diagnosis not present

## 2020-07-09 DIAGNOSIS — I10 Essential (primary) hypertension: Secondary | ICD-10-CM | POA: Diagnosis not present

## 2020-07-09 DIAGNOSIS — R946 Abnormal results of thyroid function studies: Secondary | ICD-10-CM | POA: Diagnosis not present

## 2020-07-09 DIAGNOSIS — D509 Iron deficiency anemia, unspecified: Secondary | ICD-10-CM | POA: Diagnosis not present

## 2020-07-09 DIAGNOSIS — I639 Cerebral infarction, unspecified: Secondary | ICD-10-CM | POA: Diagnosis not present

## 2020-07-09 DIAGNOSIS — Z7689 Persons encountering health services in other specified circumstances: Secondary | ICD-10-CM | POA: Diagnosis not present

## 2020-07-09 DIAGNOSIS — R7989 Other specified abnormal findings of blood chemistry: Secondary | ICD-10-CM | POA: Diagnosis not present

## 2020-07-15 DIAGNOSIS — L89151 Pressure ulcer of sacral region, stage 1: Secondary | ICD-10-CM | POA: Diagnosis not present

## 2020-07-15 DIAGNOSIS — G8929 Other chronic pain: Secondary | ICD-10-CM | POA: Diagnosis not present

## 2020-07-15 DIAGNOSIS — I671 Cerebral aneurysm, nonruptured: Secondary | ICD-10-CM | POA: Diagnosis not present

## 2020-07-15 DIAGNOSIS — I10 Essential (primary) hypertension: Secondary | ICD-10-CM | POA: Diagnosis not present

## 2020-07-15 DIAGNOSIS — I82403 Acute embolism and thrombosis of unspecified deep veins of lower extremity, bilateral: Secondary | ICD-10-CM | POA: Diagnosis not present

## 2020-07-15 DIAGNOSIS — I2699 Other pulmonary embolism without acute cor pulmonale: Secondary | ICD-10-CM | POA: Diagnosis not present

## 2020-07-15 DIAGNOSIS — M21372 Foot drop, left foot: Secondary | ICD-10-CM | POA: Diagnosis not present

## 2020-07-15 DIAGNOSIS — M5136 Other intervertebral disc degeneration, lumbar region: Secondary | ICD-10-CM | POA: Diagnosis not present

## 2020-07-15 DIAGNOSIS — M4716 Other spondylosis with myelopathy, lumbar region: Secondary | ICD-10-CM | POA: Diagnosis not present

## 2020-07-15 DIAGNOSIS — I69354 Hemiplegia and hemiparesis following cerebral infarction affecting left non-dominant side: Secondary | ICD-10-CM | POA: Diagnosis not present

## 2020-07-18 DIAGNOSIS — R519 Headache, unspecified: Secondary | ICD-10-CM | POA: Diagnosis not present

## 2020-07-18 DIAGNOSIS — U071 COVID-19: Secondary | ICD-10-CM | POA: Diagnosis not present

## 2020-07-18 DIAGNOSIS — R569 Unspecified convulsions: Secondary | ICD-10-CM | POA: Diagnosis not present

## 2020-07-18 DIAGNOSIS — Z8673 Personal history of transient ischemic attack (TIA), and cerebral infarction without residual deficits: Secondary | ICD-10-CM | POA: Diagnosis not present

## 2020-07-18 DIAGNOSIS — R6883 Chills (without fever): Secondary | ICD-10-CM | POA: Diagnosis not present

## 2020-07-18 DIAGNOSIS — Z79899 Other long term (current) drug therapy: Secondary | ICD-10-CM | POA: Diagnosis not present

## 2020-07-18 DIAGNOSIS — Z86718 Personal history of other venous thrombosis and embolism: Secondary | ICD-10-CM | POA: Diagnosis not present

## 2020-07-18 DIAGNOSIS — Z7901 Long term (current) use of anticoagulants: Secondary | ICD-10-CM | POA: Diagnosis not present

## 2020-07-18 DIAGNOSIS — R69 Illness, unspecified: Secondary | ICD-10-CM | POA: Diagnosis not present

## 2020-07-18 DIAGNOSIS — Z86711 Personal history of pulmonary embolism: Secondary | ICD-10-CM | POA: Diagnosis not present

## 2020-07-18 DIAGNOSIS — Z743 Need for continuous supervision: Secondary | ICD-10-CM | POA: Diagnosis not present

## 2020-07-18 DIAGNOSIS — R509 Fever, unspecified: Secondary | ICD-10-CM | POA: Diagnosis not present

## 2020-07-22 DIAGNOSIS — M4716 Other spondylosis with myelopathy, lumbar region: Secondary | ICD-10-CM | POA: Diagnosis not present

## 2020-07-22 DIAGNOSIS — L89151 Pressure ulcer of sacral region, stage 1: Secondary | ICD-10-CM | POA: Diagnosis not present

## 2020-07-22 DIAGNOSIS — I10 Essential (primary) hypertension: Secondary | ICD-10-CM | POA: Diagnosis not present

## 2020-07-22 DIAGNOSIS — M21372 Foot drop, left foot: Secondary | ICD-10-CM | POA: Diagnosis not present

## 2020-07-22 DIAGNOSIS — M5136 Other intervertebral disc degeneration, lumbar region: Secondary | ICD-10-CM | POA: Diagnosis not present

## 2020-07-22 DIAGNOSIS — I69354 Hemiplegia and hemiparesis following cerebral infarction affecting left non-dominant side: Secondary | ICD-10-CM | POA: Diagnosis not present

## 2020-07-22 DIAGNOSIS — I671 Cerebral aneurysm, nonruptured: Secondary | ICD-10-CM | POA: Diagnosis not present

## 2020-07-22 DIAGNOSIS — I2699 Other pulmonary embolism without acute cor pulmonale: Secondary | ICD-10-CM | POA: Diagnosis not present

## 2020-07-22 DIAGNOSIS — I82403 Acute embolism and thrombosis of unspecified deep veins of lower extremity, bilateral: Secondary | ICD-10-CM | POA: Diagnosis not present

## 2020-07-22 DIAGNOSIS — G8929 Other chronic pain: Secondary | ICD-10-CM | POA: Diagnosis not present

## 2020-07-23 DIAGNOSIS — I10 Essential (primary) hypertension: Secondary | ICD-10-CM | POA: Diagnosis not present

## 2020-07-23 DIAGNOSIS — R5383 Other fatigue: Secondary | ICD-10-CM | POA: Diagnosis not present

## 2020-07-23 DIAGNOSIS — G8929 Other chronic pain: Secondary | ICD-10-CM | POA: Diagnosis not present

## 2020-07-23 DIAGNOSIS — R5381 Other malaise: Secondary | ICD-10-CM | POA: Diagnosis not present

## 2020-07-23 DIAGNOSIS — R531 Weakness: Secondary | ICD-10-CM | POA: Diagnosis not present

## 2020-07-23 DIAGNOSIS — U071 COVID-19: Secondary | ICD-10-CM | POA: Diagnosis not present

## 2020-07-23 DIAGNOSIS — I69354 Hemiplegia and hemiparesis following cerebral infarction affecting left non-dominant side: Secondary | ICD-10-CM | POA: Diagnosis not present

## 2020-07-23 DIAGNOSIS — G629 Polyneuropathy, unspecified: Secondary | ICD-10-CM | POA: Diagnosis not present

## 2020-07-23 DIAGNOSIS — G40909 Epilepsy, unspecified, not intractable, without status epilepticus: Secondary | ICD-10-CM | POA: Diagnosis not present

## 2020-07-24 DIAGNOSIS — U071 COVID-19: Secondary | ICD-10-CM | POA: Diagnosis not present

## 2020-07-25 DIAGNOSIS — U071 COVID-19: Secondary | ICD-10-CM | POA: Diagnosis not present

## 2020-07-26 DIAGNOSIS — U071 COVID-19: Secondary | ICD-10-CM | POA: Diagnosis not present

## 2020-07-27 DIAGNOSIS — U071 COVID-19: Secondary | ICD-10-CM | POA: Diagnosis not present

## 2020-07-28 DIAGNOSIS — U071 COVID-19: Secondary | ICD-10-CM | POA: Diagnosis not present

## 2020-07-28 DIAGNOSIS — R531 Weakness: Secondary | ICD-10-CM | POA: Diagnosis not present

## 2020-07-29 DIAGNOSIS — M21372 Foot drop, left foot: Secondary | ICD-10-CM | POA: Diagnosis not present

## 2020-07-29 DIAGNOSIS — I671 Cerebral aneurysm, nonruptured: Secondary | ICD-10-CM | POA: Diagnosis not present

## 2020-07-29 DIAGNOSIS — I82403 Acute embolism and thrombosis of unspecified deep veins of lower extremity, bilateral: Secondary | ICD-10-CM | POA: Diagnosis not present

## 2020-07-29 DIAGNOSIS — L89151 Pressure ulcer of sacral region, stage 1: Secondary | ICD-10-CM | POA: Diagnosis not present

## 2020-07-29 DIAGNOSIS — G8929 Other chronic pain: Secondary | ICD-10-CM | POA: Diagnosis not present

## 2020-07-29 DIAGNOSIS — I69354 Hemiplegia and hemiparesis following cerebral infarction affecting left non-dominant side: Secondary | ICD-10-CM | POA: Diagnosis not present

## 2020-07-29 DIAGNOSIS — M5136 Other intervertebral disc degeneration, lumbar region: Secondary | ICD-10-CM | POA: Diagnosis not present

## 2020-07-29 DIAGNOSIS — M4716 Other spondylosis with myelopathy, lumbar region: Secondary | ICD-10-CM | POA: Diagnosis not present

## 2020-07-29 DIAGNOSIS — I10 Essential (primary) hypertension: Secondary | ICD-10-CM | POA: Diagnosis not present

## 2020-07-29 DIAGNOSIS — I2699 Other pulmonary embolism without acute cor pulmonale: Secondary | ICD-10-CM | POA: Diagnosis not present

## 2020-07-30 DIAGNOSIS — R2689 Other abnormalities of gait and mobility: Secondary | ICD-10-CM | POA: Diagnosis not present

## 2020-08-03 DIAGNOSIS — I2699 Other pulmonary embolism without acute cor pulmonale: Secondary | ICD-10-CM | POA: Diagnosis not present

## 2020-08-03 DIAGNOSIS — Z09 Encounter for follow-up examination after completed treatment for conditions other than malignant neoplasm: Secondary | ICD-10-CM | POA: Diagnosis not present

## 2020-08-03 DIAGNOSIS — M5136 Other intervertebral disc degeneration, lumbar region: Secondary | ICD-10-CM | POA: Diagnosis not present

## 2020-08-03 DIAGNOSIS — M21372 Foot drop, left foot: Secondary | ICD-10-CM | POA: Diagnosis not present

## 2020-08-03 DIAGNOSIS — I69354 Hemiplegia and hemiparesis following cerebral infarction affecting left non-dominant side: Secondary | ICD-10-CM | POA: Diagnosis not present

## 2020-08-03 DIAGNOSIS — I10 Essential (primary) hypertension: Secondary | ICD-10-CM | POA: Diagnosis not present

## 2020-08-03 DIAGNOSIS — G8929 Other chronic pain: Secondary | ICD-10-CM | POA: Diagnosis not present

## 2020-08-03 DIAGNOSIS — U071 COVID-19: Secondary | ICD-10-CM | POA: Diagnosis not present

## 2020-08-03 DIAGNOSIS — L89151 Pressure ulcer of sacral region, stage 1: Secondary | ICD-10-CM | POA: Diagnosis not present

## 2020-08-03 DIAGNOSIS — R5381 Other malaise: Secondary | ICD-10-CM | POA: Diagnosis not present

## 2020-08-03 DIAGNOSIS — I82403 Acute embolism and thrombosis of unspecified deep veins of lower extremity, bilateral: Secondary | ICD-10-CM | POA: Diagnosis not present

## 2020-08-03 DIAGNOSIS — M4716 Other spondylosis with myelopathy, lumbar region: Secondary | ICD-10-CM | POA: Diagnosis not present

## 2020-08-03 DIAGNOSIS — I671 Cerebral aneurysm, nonruptured: Secondary | ICD-10-CM | POA: Diagnosis not present

## 2020-08-03 DIAGNOSIS — Z0289 Encounter for other administrative examinations: Secondary | ICD-10-CM | POA: Diagnosis not present

## 2020-08-05 DIAGNOSIS — M21372 Foot drop, left foot: Secondary | ICD-10-CM | POA: Diagnosis not present

## 2020-08-05 DIAGNOSIS — M4716 Other spondylosis with myelopathy, lumbar region: Secondary | ICD-10-CM | POA: Diagnosis not present

## 2020-08-05 DIAGNOSIS — L89151 Pressure ulcer of sacral region, stage 1: Secondary | ICD-10-CM | POA: Diagnosis not present

## 2020-08-05 DIAGNOSIS — I82403 Acute embolism and thrombosis of unspecified deep veins of lower extremity, bilateral: Secondary | ICD-10-CM | POA: Diagnosis not present

## 2020-08-05 DIAGNOSIS — I2699 Other pulmonary embolism without acute cor pulmonale: Secondary | ICD-10-CM | POA: Diagnosis not present

## 2020-08-05 DIAGNOSIS — G8929 Other chronic pain: Secondary | ICD-10-CM | POA: Diagnosis not present

## 2020-08-05 DIAGNOSIS — I671 Cerebral aneurysm, nonruptured: Secondary | ICD-10-CM | POA: Diagnosis not present

## 2020-08-05 DIAGNOSIS — I10 Essential (primary) hypertension: Secondary | ICD-10-CM | POA: Diagnosis not present

## 2020-08-05 DIAGNOSIS — I69354 Hemiplegia and hemiparesis following cerebral infarction affecting left non-dominant side: Secondary | ICD-10-CM | POA: Diagnosis not present

## 2020-08-05 DIAGNOSIS — M5136 Other intervertebral disc degeneration, lumbar region: Secondary | ICD-10-CM | POA: Diagnosis not present

## 2020-08-06 DIAGNOSIS — I69354 Hemiplegia and hemiparesis following cerebral infarction affecting left non-dominant side: Secondary | ICD-10-CM | POA: Diagnosis not present

## 2020-08-06 DIAGNOSIS — G8929 Other chronic pain: Secondary | ICD-10-CM | POA: Diagnosis not present

## 2020-08-06 DIAGNOSIS — M5136 Other intervertebral disc degeneration, lumbar region: Secondary | ICD-10-CM | POA: Diagnosis not present

## 2020-08-06 DIAGNOSIS — I82403 Acute embolism and thrombosis of unspecified deep veins of lower extremity, bilateral: Secondary | ICD-10-CM | POA: Diagnosis not present

## 2020-08-06 DIAGNOSIS — I10 Essential (primary) hypertension: Secondary | ICD-10-CM | POA: Diagnosis not present

## 2020-08-06 DIAGNOSIS — L89151 Pressure ulcer of sacral region, stage 1: Secondary | ICD-10-CM | POA: Diagnosis not present

## 2020-08-06 DIAGNOSIS — M21372 Foot drop, left foot: Secondary | ICD-10-CM | POA: Diagnosis not present

## 2020-08-06 DIAGNOSIS — M4716 Other spondylosis with myelopathy, lumbar region: Secondary | ICD-10-CM | POA: Diagnosis not present

## 2020-08-06 DIAGNOSIS — I2699 Other pulmonary embolism without acute cor pulmonale: Secondary | ICD-10-CM | POA: Diagnosis not present

## 2020-08-06 DIAGNOSIS — I671 Cerebral aneurysm, nonruptured: Secondary | ICD-10-CM | POA: Diagnosis not present

## 2020-08-10 DIAGNOSIS — I82403 Acute embolism and thrombosis of unspecified deep veins of lower extremity, bilateral: Secondary | ICD-10-CM | POA: Diagnosis not present

## 2020-08-10 DIAGNOSIS — M21372 Foot drop, left foot: Secondary | ICD-10-CM | POA: Diagnosis not present

## 2020-08-10 DIAGNOSIS — I2699 Other pulmonary embolism without acute cor pulmonale: Secondary | ICD-10-CM | POA: Diagnosis not present

## 2020-08-10 DIAGNOSIS — M4716 Other spondylosis with myelopathy, lumbar region: Secondary | ICD-10-CM | POA: Diagnosis not present

## 2020-08-10 DIAGNOSIS — I10 Essential (primary) hypertension: Secondary | ICD-10-CM | POA: Diagnosis not present

## 2020-08-10 DIAGNOSIS — G8929 Other chronic pain: Secondary | ICD-10-CM | POA: Diagnosis not present

## 2020-08-10 DIAGNOSIS — I671 Cerebral aneurysm, nonruptured: Secondary | ICD-10-CM | POA: Diagnosis not present

## 2020-08-10 DIAGNOSIS — I69354 Hemiplegia and hemiparesis following cerebral infarction affecting left non-dominant side: Secondary | ICD-10-CM | POA: Diagnosis not present

## 2020-08-10 DIAGNOSIS — L89151 Pressure ulcer of sacral region, stage 1: Secondary | ICD-10-CM | POA: Diagnosis not present

## 2020-08-10 DIAGNOSIS — M5136 Other intervertebral disc degeneration, lumbar region: Secondary | ICD-10-CM | POA: Diagnosis not present

## 2020-08-13 DIAGNOSIS — I10 Essential (primary) hypertension: Secondary | ICD-10-CM | POA: Diagnosis not present

## 2020-08-13 DIAGNOSIS — M4716 Other spondylosis with myelopathy, lumbar region: Secondary | ICD-10-CM | POA: Diagnosis not present

## 2020-08-13 DIAGNOSIS — I69354 Hemiplegia and hemiparesis following cerebral infarction affecting left non-dominant side: Secondary | ICD-10-CM | POA: Diagnosis not present

## 2020-08-13 DIAGNOSIS — I671 Cerebral aneurysm, nonruptured: Secondary | ICD-10-CM | POA: Diagnosis not present

## 2020-08-13 DIAGNOSIS — I82403 Acute embolism and thrombosis of unspecified deep veins of lower extremity, bilateral: Secondary | ICD-10-CM | POA: Diagnosis not present

## 2020-08-13 DIAGNOSIS — L89151 Pressure ulcer of sacral region, stage 1: Secondary | ICD-10-CM | POA: Diagnosis not present

## 2020-08-13 DIAGNOSIS — I2699 Other pulmonary embolism without acute cor pulmonale: Secondary | ICD-10-CM | POA: Diagnosis not present

## 2020-08-13 DIAGNOSIS — G8929 Other chronic pain: Secondary | ICD-10-CM | POA: Diagnosis not present

## 2020-08-13 DIAGNOSIS — M5136 Other intervertebral disc degeneration, lumbar region: Secondary | ICD-10-CM | POA: Diagnosis not present

## 2020-08-13 DIAGNOSIS — M21372 Foot drop, left foot: Secondary | ICD-10-CM | POA: Diagnosis not present

## 2020-08-19 DIAGNOSIS — E785 Hyperlipidemia, unspecified: Secondary | ICD-10-CM | POA: Diagnosis not present

## 2020-08-19 DIAGNOSIS — I10 Essential (primary) hypertension: Secondary | ICD-10-CM | POA: Diagnosis not present

## 2020-08-20 DIAGNOSIS — R4189 Other symptoms and signs involving cognitive functions and awareness: Secondary | ICD-10-CM | POA: Diagnosis not present

## 2020-08-20 DIAGNOSIS — Z8673 Personal history of transient ischemic attack (TIA), and cerebral infarction without residual deficits: Secondary | ICD-10-CM | POA: Diagnosis not present

## 2020-08-20 DIAGNOSIS — G629 Polyneuropathy, unspecified: Secondary | ICD-10-CM | POA: Diagnosis not present

## 2020-08-20 DIAGNOSIS — E876 Hypokalemia: Secondary | ICD-10-CM | POA: Diagnosis not present

## 2020-08-20 DIAGNOSIS — R7611 Nonspecific reaction to tuberculin skin test without active tuberculosis: Secondary | ICD-10-CM | POA: Diagnosis not present

## 2020-08-20 DIAGNOSIS — I10 Essential (primary) hypertension: Secondary | ICD-10-CM | POA: Diagnosis not present

## 2020-08-20 DIAGNOSIS — R69 Illness, unspecified: Secondary | ICD-10-CM | POA: Diagnosis not present

## 2020-08-25 DIAGNOSIS — I10 Essential (primary) hypertension: Secondary | ICD-10-CM | POA: Diagnosis not present

## 2020-08-26 DIAGNOSIS — Z86718 Personal history of other venous thrombosis and embolism: Secondary | ICD-10-CM | POA: Diagnosis not present

## 2020-08-26 DIAGNOSIS — Z8673 Personal history of transient ischemic attack (TIA), and cerebral infarction without residual deficits: Secondary | ICD-10-CM | POA: Diagnosis not present

## 2020-08-26 DIAGNOSIS — E876 Hypokalemia: Secondary | ICD-10-CM | POA: Diagnosis not present

## 2020-08-26 DIAGNOSIS — R000 Tachycardia, unspecified: Secondary | ICD-10-CM | POA: Diagnosis not present

## 2020-08-26 DIAGNOSIS — G629 Polyneuropathy, unspecified: Secondary | ICD-10-CM | POA: Diagnosis not present

## 2020-08-26 DIAGNOSIS — Z86711 Personal history of pulmonary embolism: Secondary | ICD-10-CM | POA: Diagnosis not present

## 2020-08-26 DIAGNOSIS — R4189 Other symptoms and signs involving cognitive functions and awareness: Secondary | ICD-10-CM | POA: Diagnosis not present

## 2020-08-26 DIAGNOSIS — I10 Essential (primary) hypertension: Secondary | ICD-10-CM | POA: Diagnosis not present

## 2020-08-29 DIAGNOSIS — R2689 Other abnormalities of gait and mobility: Secondary | ICD-10-CM | POA: Diagnosis not present

## 2020-08-31 DIAGNOSIS — R2689 Other abnormalities of gait and mobility: Secondary | ICD-10-CM | POA: Diagnosis not present

## 2020-08-31 DIAGNOSIS — M6281 Muscle weakness (generalized): Secondary | ICD-10-CM | POA: Diagnosis not present

## 2020-08-31 DIAGNOSIS — I69354 Hemiplegia and hemiparesis following cerebral infarction affecting left non-dominant side: Secondary | ICD-10-CM | POA: Diagnosis not present

## 2020-09-01 DIAGNOSIS — R2689 Other abnormalities of gait and mobility: Secondary | ICD-10-CM | POA: Diagnosis not present

## 2020-09-01 DIAGNOSIS — I69354 Hemiplegia and hemiparesis following cerebral infarction affecting left non-dominant side: Secondary | ICD-10-CM | POA: Diagnosis not present

## 2020-09-01 DIAGNOSIS — M6281 Muscle weakness (generalized): Secondary | ICD-10-CM | POA: Diagnosis not present

## 2020-09-03 DIAGNOSIS — M6281 Muscle weakness (generalized): Secondary | ICD-10-CM | POA: Diagnosis not present

## 2020-09-03 DIAGNOSIS — R2689 Other abnormalities of gait and mobility: Secondary | ICD-10-CM | POA: Diagnosis not present

## 2020-09-03 DIAGNOSIS — I69354 Hemiplegia and hemiparesis following cerebral infarction affecting left non-dominant side: Secondary | ICD-10-CM | POA: Diagnosis not present

## 2020-09-04 DIAGNOSIS — R2689 Other abnormalities of gait and mobility: Secondary | ICD-10-CM | POA: Diagnosis not present

## 2020-09-04 DIAGNOSIS — I69354 Hemiplegia and hemiparesis following cerebral infarction affecting left non-dominant side: Secondary | ICD-10-CM | POA: Diagnosis not present

## 2020-09-04 DIAGNOSIS — M6281 Muscle weakness (generalized): Secondary | ICD-10-CM | POA: Diagnosis not present

## 2020-09-07 DIAGNOSIS — R2689 Other abnormalities of gait and mobility: Secondary | ICD-10-CM | POA: Diagnosis not present

## 2020-09-07 DIAGNOSIS — I69354 Hemiplegia and hemiparesis following cerebral infarction affecting left non-dominant side: Secondary | ICD-10-CM | POA: Diagnosis not present

## 2020-09-07 DIAGNOSIS — M6281 Muscle weakness (generalized): Secondary | ICD-10-CM | POA: Diagnosis not present

## 2020-09-08 DIAGNOSIS — M6281 Muscle weakness (generalized): Secondary | ICD-10-CM | POA: Diagnosis not present

## 2020-09-08 DIAGNOSIS — I69354 Hemiplegia and hemiparesis following cerebral infarction affecting left non-dominant side: Secondary | ICD-10-CM | POA: Diagnosis not present

## 2020-09-08 DIAGNOSIS — R2689 Other abnormalities of gait and mobility: Secondary | ICD-10-CM | POA: Diagnosis not present

## 2020-09-09 DIAGNOSIS — I69354 Hemiplegia and hemiparesis following cerebral infarction affecting left non-dominant side: Secondary | ICD-10-CM | POA: Diagnosis not present

## 2020-09-09 DIAGNOSIS — M6281 Muscle weakness (generalized): Secondary | ICD-10-CM | POA: Diagnosis not present

## 2020-09-09 DIAGNOSIS — R2689 Other abnormalities of gait and mobility: Secondary | ICD-10-CM | POA: Diagnosis not present

## 2020-09-10 DIAGNOSIS — R2689 Other abnormalities of gait and mobility: Secondary | ICD-10-CM | POA: Diagnosis not present

## 2020-09-10 DIAGNOSIS — M6281 Muscle weakness (generalized): Secondary | ICD-10-CM | POA: Diagnosis not present

## 2020-09-10 DIAGNOSIS — I69354 Hemiplegia and hemiparesis following cerebral infarction affecting left non-dominant side: Secondary | ICD-10-CM | POA: Diagnosis not present

## 2020-09-11 DIAGNOSIS — I69354 Hemiplegia and hemiparesis following cerebral infarction affecting left non-dominant side: Secondary | ICD-10-CM | POA: Diagnosis not present

## 2020-09-11 DIAGNOSIS — M6281 Muscle weakness (generalized): Secondary | ICD-10-CM | POA: Diagnosis not present

## 2020-09-11 DIAGNOSIS — R2689 Other abnormalities of gait and mobility: Secondary | ICD-10-CM | POA: Diagnosis not present

## 2020-09-15 DIAGNOSIS — R2689 Other abnormalities of gait and mobility: Secondary | ICD-10-CM | POA: Diagnosis not present

## 2020-09-15 DIAGNOSIS — M62838 Other muscle spasm: Secondary | ICD-10-CM | POA: Diagnosis not present

## 2020-09-15 DIAGNOSIS — I69354 Hemiplegia and hemiparesis following cerebral infarction affecting left non-dominant side: Secondary | ICD-10-CM | POA: Diagnosis not present

## 2020-09-15 DIAGNOSIS — M6281 Muscle weakness (generalized): Secondary | ICD-10-CM | POA: Diagnosis not present

## 2020-09-16 DIAGNOSIS — I69354 Hemiplegia and hemiparesis following cerebral infarction affecting left non-dominant side: Secondary | ICD-10-CM | POA: Diagnosis not present

## 2020-09-16 DIAGNOSIS — M6281 Muscle weakness (generalized): Secondary | ICD-10-CM | POA: Diagnosis not present

## 2020-09-16 DIAGNOSIS — R2689 Other abnormalities of gait and mobility: Secondary | ICD-10-CM | POA: Diagnosis not present

## 2020-09-17 DIAGNOSIS — M6281 Muscle weakness (generalized): Secondary | ICD-10-CM | POA: Diagnosis not present

## 2020-09-17 DIAGNOSIS — I69354 Hemiplegia and hemiparesis following cerebral infarction affecting left non-dominant side: Secondary | ICD-10-CM | POA: Diagnosis not present

## 2020-09-17 DIAGNOSIS — R2689 Other abnormalities of gait and mobility: Secondary | ICD-10-CM | POA: Diagnosis not present

## 2020-09-18 DIAGNOSIS — I69354 Hemiplegia and hemiparesis following cerebral infarction affecting left non-dominant side: Secondary | ICD-10-CM | POA: Diagnosis not present

## 2020-09-18 DIAGNOSIS — M6281 Muscle weakness (generalized): Secondary | ICD-10-CM | POA: Diagnosis not present

## 2020-09-18 DIAGNOSIS — R2689 Other abnormalities of gait and mobility: Secondary | ICD-10-CM | POA: Diagnosis not present

## 2020-09-19 DIAGNOSIS — R2689 Other abnormalities of gait and mobility: Secondary | ICD-10-CM | POA: Diagnosis not present

## 2020-09-19 DIAGNOSIS — I69354 Hemiplegia and hemiparesis following cerebral infarction affecting left non-dominant side: Secondary | ICD-10-CM | POA: Diagnosis not present

## 2020-09-19 DIAGNOSIS — M6281 Muscle weakness (generalized): Secondary | ICD-10-CM | POA: Diagnosis not present

## 2020-09-21 DIAGNOSIS — R2689 Other abnormalities of gait and mobility: Secondary | ICD-10-CM | POA: Diagnosis not present

## 2020-09-21 DIAGNOSIS — M6281 Muscle weakness (generalized): Secondary | ICD-10-CM | POA: Diagnosis not present

## 2020-09-21 DIAGNOSIS — I69354 Hemiplegia and hemiparesis following cerebral infarction affecting left non-dominant side: Secondary | ICD-10-CM | POA: Diagnosis not present

## 2020-09-22 DIAGNOSIS — M6281 Muscle weakness (generalized): Secondary | ICD-10-CM | POA: Diagnosis not present

## 2020-09-22 DIAGNOSIS — I69354 Hemiplegia and hemiparesis following cerebral infarction affecting left non-dominant side: Secondary | ICD-10-CM | POA: Diagnosis not present

## 2020-09-22 DIAGNOSIS — R2689 Other abnormalities of gait and mobility: Secondary | ICD-10-CM | POA: Diagnosis not present

## 2020-09-23 DIAGNOSIS — M6281 Muscle weakness (generalized): Secondary | ICD-10-CM | POA: Diagnosis not present

## 2020-09-23 DIAGNOSIS — I69354 Hemiplegia and hemiparesis following cerebral infarction affecting left non-dominant side: Secondary | ICD-10-CM | POA: Diagnosis not present

## 2020-09-23 DIAGNOSIS — R2689 Other abnormalities of gait and mobility: Secondary | ICD-10-CM | POA: Diagnosis not present

## 2020-09-24 DIAGNOSIS — I69354 Hemiplegia and hemiparesis following cerebral infarction affecting left non-dominant side: Secondary | ICD-10-CM | POA: Diagnosis not present

## 2020-09-24 DIAGNOSIS — M6281 Muscle weakness (generalized): Secondary | ICD-10-CM | POA: Diagnosis not present

## 2020-09-24 DIAGNOSIS — R2689 Other abnormalities of gait and mobility: Secondary | ICD-10-CM | POA: Diagnosis not present

## 2020-09-25 DIAGNOSIS — I69354 Hemiplegia and hemiparesis following cerebral infarction affecting left non-dominant side: Secondary | ICD-10-CM | POA: Diagnosis not present

## 2020-09-25 DIAGNOSIS — R2689 Other abnormalities of gait and mobility: Secondary | ICD-10-CM | POA: Diagnosis not present

## 2020-09-25 DIAGNOSIS — M6281 Muscle weakness (generalized): Secondary | ICD-10-CM | POA: Diagnosis not present

## 2020-09-28 DIAGNOSIS — I69354 Hemiplegia and hemiparesis following cerebral infarction affecting left non-dominant side: Secondary | ICD-10-CM | POA: Diagnosis not present

## 2020-09-28 DIAGNOSIS — D649 Anemia, unspecified: Secondary | ICD-10-CM | POA: Diagnosis not present

## 2020-09-28 DIAGNOSIS — M6281 Muscle weakness (generalized): Secondary | ICD-10-CM | POA: Diagnosis not present

## 2020-09-28 DIAGNOSIS — R2689 Other abnormalities of gait and mobility: Secondary | ICD-10-CM | POA: Diagnosis not present

## 2020-09-29 DIAGNOSIS — R2689 Other abnormalities of gait and mobility: Secondary | ICD-10-CM | POA: Diagnosis not present

## 2020-09-30 DIAGNOSIS — M6281 Muscle weakness (generalized): Secondary | ICD-10-CM | POA: Diagnosis not present

## 2020-09-30 DIAGNOSIS — I69354 Hemiplegia and hemiparesis following cerebral infarction affecting left non-dominant side: Secondary | ICD-10-CM | POA: Diagnosis not present

## 2020-09-30 DIAGNOSIS — R2689 Other abnormalities of gait and mobility: Secondary | ICD-10-CM | POA: Diagnosis not present

## 2020-10-01 DIAGNOSIS — R2689 Other abnormalities of gait and mobility: Secondary | ICD-10-CM | POA: Diagnosis not present

## 2020-10-01 DIAGNOSIS — M6281 Muscle weakness (generalized): Secondary | ICD-10-CM | POA: Diagnosis not present

## 2020-10-01 DIAGNOSIS — I69354 Hemiplegia and hemiparesis following cerebral infarction affecting left non-dominant side: Secondary | ICD-10-CM | POA: Diagnosis not present

## 2020-10-02 DIAGNOSIS — I69354 Hemiplegia and hemiparesis following cerebral infarction affecting left non-dominant side: Secondary | ICD-10-CM | POA: Diagnosis not present

## 2020-10-02 DIAGNOSIS — M6281 Muscle weakness (generalized): Secondary | ICD-10-CM | POA: Diagnosis not present

## 2020-10-02 DIAGNOSIS — R2689 Other abnormalities of gait and mobility: Secondary | ICD-10-CM | POA: Diagnosis not present

## 2020-10-03 DIAGNOSIS — R2689 Other abnormalities of gait and mobility: Secondary | ICD-10-CM | POA: Diagnosis not present

## 2020-10-03 DIAGNOSIS — I69354 Hemiplegia and hemiparesis following cerebral infarction affecting left non-dominant side: Secondary | ICD-10-CM | POA: Diagnosis not present

## 2020-10-03 DIAGNOSIS — M6281 Muscle weakness (generalized): Secondary | ICD-10-CM | POA: Diagnosis not present

## 2020-10-05 DIAGNOSIS — I69354 Hemiplegia and hemiparesis following cerebral infarction affecting left non-dominant side: Secondary | ICD-10-CM | POA: Diagnosis not present

## 2020-10-05 DIAGNOSIS — M6281 Muscle weakness (generalized): Secondary | ICD-10-CM | POA: Diagnosis not present

## 2020-10-05 DIAGNOSIS — R2689 Other abnormalities of gait and mobility: Secondary | ICD-10-CM | POA: Diagnosis not present

## 2020-10-06 DIAGNOSIS — R2689 Other abnormalities of gait and mobility: Secondary | ICD-10-CM | POA: Diagnosis not present

## 2020-10-06 DIAGNOSIS — I69354 Hemiplegia and hemiparesis following cerebral infarction affecting left non-dominant side: Secondary | ICD-10-CM | POA: Diagnosis not present

## 2020-10-06 DIAGNOSIS — M6281 Muscle weakness (generalized): Secondary | ICD-10-CM | POA: Diagnosis not present

## 2020-10-07 DIAGNOSIS — I69354 Hemiplegia and hemiparesis following cerebral infarction affecting left non-dominant side: Secondary | ICD-10-CM | POA: Diagnosis not present

## 2020-10-07 DIAGNOSIS — M6281 Muscle weakness (generalized): Secondary | ICD-10-CM | POA: Diagnosis not present

## 2020-10-07 DIAGNOSIS — R2689 Other abnormalities of gait and mobility: Secondary | ICD-10-CM | POA: Diagnosis not present

## 2020-10-08 DIAGNOSIS — I69354 Hemiplegia and hemiparesis following cerebral infarction affecting left non-dominant side: Secondary | ICD-10-CM | POA: Diagnosis not present

## 2020-10-08 DIAGNOSIS — M6281 Muscle weakness (generalized): Secondary | ICD-10-CM | POA: Diagnosis not present

## 2020-10-08 DIAGNOSIS — R2689 Other abnormalities of gait and mobility: Secondary | ICD-10-CM | POA: Diagnosis not present

## 2020-10-09 DIAGNOSIS — I69354 Hemiplegia and hemiparesis following cerebral infarction affecting left non-dominant side: Secondary | ICD-10-CM | POA: Diagnosis not present

## 2020-10-09 DIAGNOSIS — M6281 Muscle weakness (generalized): Secondary | ICD-10-CM | POA: Diagnosis not present

## 2020-10-09 DIAGNOSIS — R2689 Other abnormalities of gait and mobility: Secondary | ICD-10-CM | POA: Diagnosis not present

## 2020-10-12 DIAGNOSIS — R2689 Other abnormalities of gait and mobility: Secondary | ICD-10-CM | POA: Diagnosis not present

## 2020-10-12 DIAGNOSIS — M6281 Muscle weakness (generalized): Secondary | ICD-10-CM | POA: Diagnosis not present

## 2020-10-12 DIAGNOSIS — I69354 Hemiplegia and hemiparesis following cerebral infarction affecting left non-dominant side: Secondary | ICD-10-CM | POA: Diagnosis not present

## 2020-10-13 DIAGNOSIS — I69354 Hemiplegia and hemiparesis following cerebral infarction affecting left non-dominant side: Secondary | ICD-10-CM | POA: Diagnosis not present

## 2020-10-13 DIAGNOSIS — R2689 Other abnormalities of gait and mobility: Secondary | ICD-10-CM | POA: Diagnosis not present

## 2020-10-13 DIAGNOSIS — I671 Cerebral aneurysm, nonruptured: Secondary | ICD-10-CM | POA: Diagnosis not present

## 2020-10-13 DIAGNOSIS — I69154 Hemiplegia and hemiparesis following nontraumatic intracerebral hemorrhage affecting left non-dominant side: Secondary | ICD-10-CM | POA: Diagnosis not present

## 2020-10-13 DIAGNOSIS — M6281 Muscle weakness (generalized): Secondary | ICD-10-CM | POA: Diagnosis not present

## 2020-10-14 DIAGNOSIS — M6281 Muscle weakness (generalized): Secondary | ICD-10-CM | POA: Diagnosis not present

## 2020-10-14 DIAGNOSIS — L309 Dermatitis, unspecified: Secondary | ICD-10-CM | POA: Diagnosis not present

## 2020-10-14 DIAGNOSIS — R2689 Other abnormalities of gait and mobility: Secondary | ICD-10-CM | POA: Diagnosis not present

## 2020-10-14 DIAGNOSIS — I69354 Hemiplegia and hemiparesis following cerebral infarction affecting left non-dominant side: Secondary | ICD-10-CM | POA: Diagnosis not present

## 2020-10-15 DIAGNOSIS — I69354 Hemiplegia and hemiparesis following cerebral infarction affecting left non-dominant side: Secondary | ICD-10-CM | POA: Diagnosis not present

## 2020-10-15 DIAGNOSIS — R2689 Other abnormalities of gait and mobility: Secondary | ICD-10-CM | POA: Diagnosis not present

## 2020-10-15 DIAGNOSIS — M6281 Muscle weakness (generalized): Secondary | ICD-10-CM | POA: Diagnosis not present

## 2020-10-16 DIAGNOSIS — R2689 Other abnormalities of gait and mobility: Secondary | ICD-10-CM | POA: Diagnosis not present

## 2020-10-16 DIAGNOSIS — I69354 Hemiplegia and hemiparesis following cerebral infarction affecting left non-dominant side: Secondary | ICD-10-CM | POA: Diagnosis not present

## 2020-10-16 DIAGNOSIS — D649 Anemia, unspecified: Secondary | ICD-10-CM | POA: Diagnosis not present

## 2020-10-16 DIAGNOSIS — M6281 Muscle weakness (generalized): Secondary | ICD-10-CM | POA: Diagnosis not present

## 2020-10-18 DIAGNOSIS — D649 Anemia, unspecified: Secondary | ICD-10-CM | POA: Diagnosis not present

## 2020-10-19 DIAGNOSIS — D649 Anemia, unspecified: Secondary | ICD-10-CM | POA: Diagnosis not present

## 2020-10-20 DIAGNOSIS — M6281 Muscle weakness (generalized): Secondary | ICD-10-CM | POA: Diagnosis not present

## 2020-10-20 DIAGNOSIS — R2689 Other abnormalities of gait and mobility: Secondary | ICD-10-CM | POA: Diagnosis not present

## 2020-10-20 DIAGNOSIS — I69354 Hemiplegia and hemiparesis following cerebral infarction affecting left non-dominant side: Secondary | ICD-10-CM | POA: Diagnosis not present

## 2020-10-21 DIAGNOSIS — Z7901 Long term (current) use of anticoagulants: Secondary | ICD-10-CM | POA: Diagnosis not present

## 2020-10-21 DIAGNOSIS — I10 Essential (primary) hypertension: Secondary | ICD-10-CM | POA: Diagnosis not present

## 2020-10-21 DIAGNOSIS — Z8673 Personal history of transient ischemic attack (TIA), and cerebral infarction without residual deficits: Secondary | ICD-10-CM | POA: Diagnosis not present

## 2020-10-21 DIAGNOSIS — G629 Polyneuropathy, unspecified: Secondary | ICD-10-CM | POA: Diagnosis not present

## 2020-10-22 DIAGNOSIS — M6281 Muscle weakness (generalized): Secondary | ICD-10-CM | POA: Diagnosis not present

## 2020-10-22 DIAGNOSIS — I69354 Hemiplegia and hemiparesis following cerebral infarction affecting left non-dominant side: Secondary | ICD-10-CM | POA: Diagnosis not present

## 2020-10-22 DIAGNOSIS — R2689 Other abnormalities of gait and mobility: Secondary | ICD-10-CM | POA: Diagnosis not present

## 2020-10-23 DIAGNOSIS — I69354 Hemiplegia and hemiparesis following cerebral infarction affecting left non-dominant side: Secondary | ICD-10-CM | POA: Diagnosis not present

## 2020-10-23 DIAGNOSIS — R2689 Other abnormalities of gait and mobility: Secondary | ICD-10-CM | POA: Diagnosis not present

## 2020-10-23 DIAGNOSIS — M6281 Muscle weakness (generalized): Secondary | ICD-10-CM | POA: Diagnosis not present

## 2020-10-26 DIAGNOSIS — M6281 Muscle weakness (generalized): Secondary | ICD-10-CM | POA: Diagnosis not present

## 2020-10-26 DIAGNOSIS — I69354 Hemiplegia and hemiparesis following cerebral infarction affecting left non-dominant side: Secondary | ICD-10-CM | POA: Diagnosis not present

## 2020-10-26 DIAGNOSIS — R2689 Other abnormalities of gait and mobility: Secondary | ICD-10-CM | POA: Diagnosis not present

## 2020-10-27 DIAGNOSIS — I69354 Hemiplegia and hemiparesis following cerebral infarction affecting left non-dominant side: Secondary | ICD-10-CM | POA: Diagnosis not present

## 2020-10-27 DIAGNOSIS — M6281 Muscle weakness (generalized): Secondary | ICD-10-CM | POA: Diagnosis not present

## 2020-10-27 DIAGNOSIS — R2689 Other abnormalities of gait and mobility: Secondary | ICD-10-CM | POA: Diagnosis not present

## 2020-10-28 DIAGNOSIS — I69354 Hemiplegia and hemiparesis following cerebral infarction affecting left non-dominant side: Secondary | ICD-10-CM | POA: Diagnosis not present

## 2020-10-28 DIAGNOSIS — M6281 Muscle weakness (generalized): Secondary | ICD-10-CM | POA: Diagnosis not present

## 2020-10-28 DIAGNOSIS — R2689 Other abnormalities of gait and mobility: Secondary | ICD-10-CM | POA: Diagnosis not present

## 2020-10-29 DIAGNOSIS — M6281 Muscle weakness (generalized): Secondary | ICD-10-CM | POA: Diagnosis not present

## 2020-10-29 DIAGNOSIS — I69354 Hemiplegia and hemiparesis following cerebral infarction affecting left non-dominant side: Secondary | ICD-10-CM | POA: Diagnosis not present

## 2020-10-29 DIAGNOSIS — R2689 Other abnormalities of gait and mobility: Secondary | ICD-10-CM | POA: Diagnosis not present

## 2020-10-30 DIAGNOSIS — I69354 Hemiplegia and hemiparesis following cerebral infarction affecting left non-dominant side: Secondary | ICD-10-CM | POA: Diagnosis not present

## 2020-10-30 DIAGNOSIS — R2689 Other abnormalities of gait and mobility: Secondary | ICD-10-CM | POA: Diagnosis not present

## 2020-10-30 DIAGNOSIS — M6281 Muscle weakness (generalized): Secondary | ICD-10-CM | POA: Diagnosis not present

## 2020-11-03 DIAGNOSIS — I69354 Hemiplegia and hemiparesis following cerebral infarction affecting left non-dominant side: Secondary | ICD-10-CM | POA: Diagnosis not present

## 2020-11-03 DIAGNOSIS — R2689 Other abnormalities of gait and mobility: Secondary | ICD-10-CM | POA: Diagnosis not present

## 2020-11-03 DIAGNOSIS — M6281 Muscle weakness (generalized): Secondary | ICD-10-CM | POA: Diagnosis not present

## 2020-11-04 DIAGNOSIS — Z87891 Personal history of nicotine dependence: Secondary | ICD-10-CM | POA: Diagnosis not present

## 2020-11-04 DIAGNOSIS — M6281 Muscle weakness (generalized): Secondary | ICD-10-CM | POA: Diagnosis not present

## 2020-11-04 DIAGNOSIS — I69354 Hemiplegia and hemiparesis following cerebral infarction affecting left non-dominant side: Secondary | ICD-10-CM | POA: Diagnosis not present

## 2020-11-04 DIAGNOSIS — D47Z9 Other specified neoplasms of uncertain behavior of lymphoid, hematopoietic and related tissue: Secondary | ICD-10-CM | POA: Diagnosis not present

## 2020-11-04 DIAGNOSIS — Z8673 Personal history of transient ischemic attack (TIA), and cerebral infarction without residual deficits: Secondary | ICD-10-CM | POA: Diagnosis not present

## 2020-11-04 DIAGNOSIS — Q998 Other specified chromosome abnormalities: Secondary | ICD-10-CM | POA: Diagnosis not present

## 2020-11-04 DIAGNOSIS — Z1379 Encounter for other screening for genetic and chromosomal anomalies: Secondary | ICD-10-CM | POA: Diagnosis not present

## 2020-11-04 DIAGNOSIS — D649 Anemia, unspecified: Secondary | ICD-10-CM | POA: Diagnosis not present

## 2020-11-04 DIAGNOSIS — R2689 Other abnormalities of gait and mobility: Secondary | ICD-10-CM | POA: Diagnosis not present

## 2020-11-05 DIAGNOSIS — L605 Yellow nail syndrome: Secondary | ICD-10-CM | POA: Diagnosis not present

## 2020-11-05 DIAGNOSIS — M6281 Muscle weakness (generalized): Secondary | ICD-10-CM | POA: Diagnosis not present

## 2020-11-05 DIAGNOSIS — M19079 Primary osteoarthritis, unspecified ankle and foot: Secondary | ICD-10-CM | POA: Diagnosis not present

## 2020-11-05 DIAGNOSIS — M79673 Pain in unspecified foot: Secondary | ICD-10-CM | POA: Diagnosis not present

## 2020-11-05 DIAGNOSIS — R2689 Other abnormalities of gait and mobility: Secondary | ICD-10-CM | POA: Diagnosis not present

## 2020-11-05 DIAGNOSIS — R269 Unspecified abnormalities of gait and mobility: Secondary | ICD-10-CM | POA: Diagnosis not present

## 2020-11-05 DIAGNOSIS — B351 Tinea unguium: Secondary | ICD-10-CM | POA: Diagnosis not present

## 2020-11-05 DIAGNOSIS — I69354 Hemiplegia and hemiparesis following cerebral infarction affecting left non-dominant side: Secondary | ICD-10-CM | POA: Diagnosis not present

## 2020-11-06 DIAGNOSIS — R2689 Other abnormalities of gait and mobility: Secondary | ICD-10-CM | POA: Diagnosis not present

## 2020-11-06 DIAGNOSIS — M6281 Muscle weakness (generalized): Secondary | ICD-10-CM | POA: Diagnosis not present

## 2020-11-06 DIAGNOSIS — I69354 Hemiplegia and hemiparesis following cerebral infarction affecting left non-dominant side: Secondary | ICD-10-CM | POA: Diagnosis not present

## 2020-11-07 DIAGNOSIS — I69354 Hemiplegia and hemiparesis following cerebral infarction affecting left non-dominant side: Secondary | ICD-10-CM | POA: Diagnosis not present

## 2020-11-07 DIAGNOSIS — M6281 Muscle weakness (generalized): Secondary | ICD-10-CM | POA: Diagnosis not present

## 2020-11-07 DIAGNOSIS — R2689 Other abnormalities of gait and mobility: Secondary | ICD-10-CM | POA: Diagnosis not present

## 2020-11-09 DIAGNOSIS — I69354 Hemiplegia and hemiparesis following cerebral infarction affecting left non-dominant side: Secondary | ICD-10-CM | POA: Diagnosis not present

## 2020-11-09 DIAGNOSIS — M6281 Muscle weakness (generalized): Secondary | ICD-10-CM | POA: Diagnosis not present

## 2020-11-09 DIAGNOSIS — R2689 Other abnormalities of gait and mobility: Secondary | ICD-10-CM | POA: Diagnosis not present

## 2020-11-10 DIAGNOSIS — I69354 Hemiplegia and hemiparesis following cerebral infarction affecting left non-dominant side: Secondary | ICD-10-CM | POA: Diagnosis not present

## 2020-11-10 DIAGNOSIS — R2689 Other abnormalities of gait and mobility: Secondary | ICD-10-CM | POA: Diagnosis not present

## 2020-11-10 DIAGNOSIS — M6281 Muscle weakness (generalized): Secondary | ICD-10-CM | POA: Diagnosis not present

## 2020-11-10 DIAGNOSIS — M25552 Pain in left hip: Secondary | ICD-10-CM | POA: Diagnosis not present

## 2020-11-11 DIAGNOSIS — Z7901 Long term (current) use of anticoagulants: Secondary | ICD-10-CM | POA: Diagnosis not present

## 2020-11-11 DIAGNOSIS — R2689 Other abnormalities of gait and mobility: Secondary | ICD-10-CM | POA: Diagnosis not present

## 2020-11-11 DIAGNOSIS — M6281 Muscle weakness (generalized): Secondary | ICD-10-CM | POA: Diagnosis not present

## 2020-11-11 DIAGNOSIS — I69354 Hemiplegia and hemiparesis following cerebral infarction affecting left non-dominant side: Secondary | ICD-10-CM | POA: Diagnosis not present

## 2020-11-11 DIAGNOSIS — D649 Anemia, unspecified: Secondary | ICD-10-CM | POA: Diagnosis not present

## 2020-11-11 DIAGNOSIS — M25552 Pain in left hip: Secondary | ICD-10-CM | POA: Diagnosis not present

## 2020-11-12 DIAGNOSIS — M6281 Muscle weakness (generalized): Secondary | ICD-10-CM | POA: Diagnosis not present

## 2020-11-12 DIAGNOSIS — I69354 Hemiplegia and hemiparesis following cerebral infarction affecting left non-dominant side: Secondary | ICD-10-CM | POA: Diagnosis not present

## 2020-11-12 DIAGNOSIS — R2689 Other abnormalities of gait and mobility: Secondary | ICD-10-CM | POA: Diagnosis not present

## 2020-11-13 DIAGNOSIS — I69354 Hemiplegia and hemiparesis following cerebral infarction affecting left non-dominant side: Secondary | ICD-10-CM | POA: Diagnosis not present

## 2020-11-13 DIAGNOSIS — M6281 Muscle weakness (generalized): Secondary | ICD-10-CM | POA: Diagnosis not present

## 2020-11-13 DIAGNOSIS — R2689 Other abnormalities of gait and mobility: Secondary | ICD-10-CM | POA: Diagnosis not present

## 2020-11-16 DIAGNOSIS — D47Z9 Other specified neoplasms of uncertain behavior of lymphoid, hematopoietic and related tissue: Secondary | ICD-10-CM | POA: Diagnosis not present

## 2020-11-16 DIAGNOSIS — Q998 Other specified chromosome abnormalities: Secondary | ICD-10-CM | POA: Diagnosis not present

## 2020-11-16 DIAGNOSIS — D649 Anemia, unspecified: Secondary | ICD-10-CM | POA: Diagnosis not present

## 2020-11-16 DIAGNOSIS — Z1379 Encounter for other screening for genetic and chromosomal anomalies: Secondary | ICD-10-CM | POA: Diagnosis not present

## 2020-11-17 DIAGNOSIS — M6281 Muscle weakness (generalized): Secondary | ICD-10-CM | POA: Diagnosis not present

## 2020-11-17 DIAGNOSIS — C9000 Multiple myeloma not having achieved remission: Secondary | ICD-10-CM | POA: Diagnosis not present

## 2020-11-17 DIAGNOSIS — D649 Anemia, unspecified: Secondary | ICD-10-CM | POA: Diagnosis not present

## 2020-11-17 DIAGNOSIS — I69354 Hemiplegia and hemiparesis following cerebral infarction affecting left non-dominant side: Secondary | ICD-10-CM | POA: Diagnosis not present

## 2020-11-17 DIAGNOSIS — R2689 Other abnormalities of gait and mobility: Secondary | ICD-10-CM | POA: Diagnosis not present

## 2020-11-17 DIAGNOSIS — M898X8 Other specified disorders of bone, other site: Secondary | ICD-10-CM | POA: Diagnosis not present

## 2020-11-17 DIAGNOSIS — Z79899 Other long term (current) drug therapy: Secondary | ICD-10-CM | POA: Diagnosis not present

## 2020-11-18 DIAGNOSIS — I69354 Hemiplegia and hemiparesis following cerebral infarction affecting left non-dominant side: Secondary | ICD-10-CM | POA: Diagnosis not present

## 2020-11-18 DIAGNOSIS — R2689 Other abnormalities of gait and mobility: Secondary | ICD-10-CM | POA: Diagnosis not present

## 2020-11-18 DIAGNOSIS — M6281 Muscle weakness (generalized): Secondary | ICD-10-CM | POA: Diagnosis not present

## 2020-11-19 DIAGNOSIS — R2689 Other abnormalities of gait and mobility: Secondary | ICD-10-CM | POA: Diagnosis not present

## 2020-11-19 DIAGNOSIS — M6281 Muscle weakness (generalized): Secondary | ICD-10-CM | POA: Diagnosis not present

## 2020-11-19 DIAGNOSIS — I69354 Hemiplegia and hemiparesis following cerebral infarction affecting left non-dominant side: Secondary | ICD-10-CM | POA: Diagnosis not present

## 2020-11-20 DIAGNOSIS — I69354 Hemiplegia and hemiparesis following cerebral infarction affecting left non-dominant side: Secondary | ICD-10-CM | POA: Diagnosis not present

## 2020-11-20 DIAGNOSIS — M6281 Muscle weakness (generalized): Secondary | ICD-10-CM | POA: Diagnosis not present

## 2020-11-20 DIAGNOSIS — R2689 Other abnormalities of gait and mobility: Secondary | ICD-10-CM | POA: Diagnosis not present

## 2020-11-21 DIAGNOSIS — R2689 Other abnormalities of gait and mobility: Secondary | ICD-10-CM | POA: Diagnosis not present

## 2020-11-30 DIAGNOSIS — D472 Monoclonal gammopathy: Secondary | ICD-10-CM | POA: Diagnosis not present

## 2020-11-30 DIAGNOSIS — D649 Anemia, unspecified: Secondary | ICD-10-CM | POA: Diagnosis not present

## 2020-12-15 DIAGNOSIS — M79605 Pain in left leg: Secondary | ICD-10-CM | POA: Diagnosis not present

## 2020-12-15 DIAGNOSIS — M79604 Pain in right leg: Secondary | ICD-10-CM | POA: Diagnosis not present

## 2020-12-16 DIAGNOSIS — Z7901 Long term (current) use of anticoagulants: Secondary | ICD-10-CM | POA: Diagnosis not present

## 2020-12-16 DIAGNOSIS — M47816 Spondylosis without myelopathy or radiculopathy, lumbar region: Secondary | ICD-10-CM | POA: Diagnosis not present

## 2020-12-16 DIAGNOSIS — D472 Monoclonal gammopathy: Secondary | ICD-10-CM | POA: Diagnosis not present

## 2020-12-16 DIAGNOSIS — R69 Illness, unspecified: Secondary | ICD-10-CM | POA: Diagnosis not present

## 2020-12-16 DIAGNOSIS — M47814 Spondylosis without myelopathy or radiculopathy, thoracic region: Secondary | ICD-10-CM | POA: Diagnosis not present

## 2020-12-16 DIAGNOSIS — M160 Bilateral primary osteoarthritis of hip: Secondary | ICD-10-CM | POA: Diagnosis not present

## 2020-12-16 DIAGNOSIS — M533 Sacrococcygeal disorders, not elsewhere classified: Secondary | ICD-10-CM | POA: Diagnosis not present

## 2020-12-17 DIAGNOSIS — R54 Age-related physical debility: Secondary | ICD-10-CM | POA: Diagnosis not present

## 2020-12-17 DIAGNOSIS — I10 Essential (primary) hypertension: Secondary | ICD-10-CM | POA: Diagnosis not present

## 2020-12-17 DIAGNOSIS — I69354 Hemiplegia and hemiparesis following cerebral infarction affecting left non-dominant side: Secondary | ICD-10-CM | POA: Diagnosis not present

## 2020-12-17 DIAGNOSIS — M5416 Radiculopathy, lumbar region: Secondary | ICD-10-CM | POA: Diagnosis not present

## 2020-12-17 DIAGNOSIS — E785 Hyperlipidemia, unspecified: Secondary | ICD-10-CM | POA: Diagnosis not present

## 2020-12-22 DIAGNOSIS — M79605 Pain in left leg: Secondary | ICD-10-CM | POA: Diagnosis not present

## 2020-12-23 DIAGNOSIS — D472 Monoclonal gammopathy: Secondary | ICD-10-CM | POA: Diagnosis not present

## 2020-12-23 DIAGNOSIS — M7918 Myalgia, other site: Secondary | ICD-10-CM | POA: Diagnosis not present

## 2020-12-30 DIAGNOSIS — R2689 Other abnormalities of gait and mobility: Secondary | ICD-10-CM | POA: Diagnosis not present

## 2020-12-31 DIAGNOSIS — F5101 Primary insomnia: Secondary | ICD-10-CM | POA: Diagnosis not present

## 2020-12-31 DIAGNOSIS — R69 Illness, unspecified: Secondary | ICD-10-CM | POA: Diagnosis not present

## 2020-12-31 DIAGNOSIS — F411 Generalized anxiety disorder: Secondary | ICD-10-CM | POA: Diagnosis not present
